# Patient Record
Sex: Male | Born: 1935 | Race: White | Hispanic: No | Marital: Married | Smoking: Former smoker
Health system: Southern US, Community
[De-identification: ages and names within clinical notes are randomized; demographics above are authoritative.]

## PROBLEM LIST (undated history)

## (undated) DIAGNOSIS — F0150 Vascular dementia without behavioral disturbance: Secondary | ICD-10-CM

## (undated) DIAGNOSIS — G43909 Migraine, unspecified, not intractable, without status migrainosus: Secondary | ICD-10-CM

## (undated) DIAGNOSIS — C801 Malignant (primary) neoplasm, unspecified: Secondary | ICD-10-CM

## (undated) DIAGNOSIS — Z9289 Personal history of other medical treatment: Secondary | ICD-10-CM

## (undated) DIAGNOSIS — I10 Essential (primary) hypertension: Secondary | ICD-10-CM

## (undated) DIAGNOSIS — K5790 Diverticulosis of intestine, part unspecified, without perforation or abscess without bleeding: Secondary | ICD-10-CM

## (undated) DIAGNOSIS — I2510 Atherosclerotic heart disease of native coronary artery without angina pectoris: Secondary | ICD-10-CM

## (undated) DIAGNOSIS — I240 Acute coronary thrombosis not resulting in myocardial infarction: Secondary | ICD-10-CM

## (undated) DIAGNOSIS — E785 Hyperlipidemia, unspecified: Secondary | ICD-10-CM

## (undated) DIAGNOSIS — H409 Unspecified glaucoma: Secondary | ICD-10-CM

## (undated) DIAGNOSIS — Z87442 Personal history of urinary calculi: Secondary | ICD-10-CM

## (undated) DIAGNOSIS — M1990 Unspecified osteoarthritis, unspecified site: Secondary | ICD-10-CM

## (undated) DIAGNOSIS — C679 Malignant neoplasm of bladder, unspecified: Secondary | ICD-10-CM

## (undated) HISTORY — DX: Acute coronary thrombosis not resulting in myocardial infarction: I24.0

## (undated) HISTORY — DX: Malignant neoplasm of bladder, unspecified: C67.9

## (undated) HISTORY — DX: Essential (primary) hypertension: I10

## (undated) HISTORY — DX: Personal history of other medical treatment: Z92.89

## (undated) HISTORY — DX: Hyperlipidemia, unspecified: E78.5

## (undated) HISTORY — PX: BASAL CELL CARCINOMA EXCISION: SHX1214

## (undated) HISTORY — PX: TONSILLECTOMY: SUR1361

## (undated) HISTORY — PX: KNEE ARTHROSCOPY: SUR90

## (undated) HISTORY — DX: Atherosclerotic heart disease of native coronary artery without angina pectoris: I25.10

## (undated) HISTORY — DX: Diverticulosis of intestine, part unspecified, without perforation or abscess without bleeding: K57.90

## (undated) HISTORY — DX: Unspecified glaucoma: H40.9

## (undated) HISTORY — PX: CATARACT EXTRACTION W/ INTRAOCULAR LENS  IMPLANT, BILATERAL: SHX1307

## (undated) NOTE — *Deleted (*Deleted)
PMR Admission Coordinator Pre-Admission Assessment  Patient: Jesus Harmon is an 23 y.o., male MRN: [id number] DOB: [birth year] Height: 5\' 11"  (180.3 cm) Weight: 61.2 kg  Insurance Information HMO: yes    PPO: ***     PCP: ***     IPA: ***     80/20: ***     OTHER: *** PRIMARY: BCBS Medicare      Policy#: [id number]      Subscriber: *** CM Name: ***      Phone#: [phone]     Fax#:  [phone]     Pre-Cert#: [id number]       Employer: *** Benefits:  Phone #: ***     Name: *** Eff. Date:  01/12/2019 - still active     Deduct:  $0 (has been met)     Out of Pocket Max: $3,900 [phone] met)     Life Max: n/a CIR: $335/admission co-pay      SNF:  100% coverage, 0% co-insurance; limited by medical necessity Outpatient: $40 co-pay per visit; limited by medical necessity Home Health 100% coverage, 0% co-insurance; limited by medical necessity DME: DME: 80% coverage, 20% co-insurance Providers: in network Home Health:  100% coverage, 0% co-insurance; limited by medical necessity DME: 80% coverage, 20% co-insurance  SECONDARY:none   The "Data Collection Information Summary" for patients in Inpatient Rehabilitation Facilities with attached "Privacy Act Statement-Health Care Records" was provided and verbally reviewed with: Patient and Family  Emergency Contact Information Contact Information    Name Relation Home Work [name], [name] Daughter   [phone]   [name] Daughter   [phone]   [name], [name] Spouse [phone]  [phone]      Current Medical History  Patient Admitting Diagnosis: THA s/p hip fx History of Present Illness: This is an 60 year old male with a history of CAD s/p stent, HFpEF,vascular dementia,Hypertension, hyperlipidemia who was sent in from [facility] office for left hip fracture. Patient had a fall onto his left hip several days ago resulting in pain most notably with ambulation.Larey Seat due to poor balance on uneven ground  outside. He has fallen multiple times over the past several weeks for similar reasons without LOC and had multiple falls this week. Mostrecent fallwas last night when he was sitting in his chair and leaning over to pick something up off the ground when he lost his balance and struck the back of his head on the wall. No LOC, dizziness, SOB, numbness/weakness or other potential causes to his falls. Struck his right cheek on the ground and suffered a small bruiseseveral weeks ago.His falls prompted him to go to the orthopedic urgent care today with his daughter where he was found to have a left hip fracture by Xray and was sent to [facility]o. Pt. underwent a left THA with anterior approach on 11/23/2019.   Patient's medical record from [facility]  has been reviewed by the rehabilitation admission coordinator and physician.  Past Medical History  Past Medical History:  Diagnosis Date  . Arthritis    back and neck (06/16/2016)  . Bladder cancer (HCC)   . Blockage of coronary artery of heart (HCC)    "widow maker", had stenting of the R side but L main is still blocked   . CAD (coronary artery disease)    a. LHC 10/2009:  pLAD 50%, small D1 70-80% (too small for PCI), EF 65% => med Rx  b. Myoview 10/2009: EF 65%, no ischemia;  c.  ETT-MV 3/14:  No ischemia, EF 63%  . Cancer [facility])    sin cancer removed nose   . Diverticulosis   . Glaucoma   . History of echocardiogram    Echo 02/2018: EF 55-60, +diastolic dysfunction, no RWMA, normal RVSF, trivial MR  . History of kidney stones    "passed them"  . HLD (hyperlipidemia)    "on RX; never had high cholestrol" (06/16/2016)  . HTN (hypertension)    "on RX; never HTN" (06/16/2016)  . Migraine    "in high school" (06/16/2016)    Family History   family history includes COPD in his father; Emphysema in his father; Heart failure in his father; Parkinson's disease in his maternal uncle; Sudden death in his mother.  Prior  Rehab/Hospitalizations Has the patient had prior rehab or hospitalizations prior to admission? Yes  Has the patient had major surgery during 100 days prior to admission? Yes   Current Medications  Current Facility-Administered Medications:  .  aspirin EC tablet 81 mg, 81 mg, Oral, Daily, Francena Hanly, MD, 81 mg at 11/24/19 1312 .  Chlorhexidine Gluconate Cloth 2 % PADS 6 each, 6 each, Topical, Daily, Rodolph Bong, MD, 6 each at 11/25/19 1211 .  docusate sodium (COLACE) capsule 100 mg, 100 mg, Oral, BID, Swinteck, Arlys John, MD, 100 mg at 11/24/19 2205 .  enoxaparin (LOVENOX) injection 30 mg, 30 mg, Subcutaneous, Q24H, Swinteck, Arlys John, MD, 30 mg at 11/25/19 1211 .  feeding supplement (ENSURE ENLIVE / ENSURE PLUS) liquid 237 mL, 237 mL, Oral, BID BM, Rodolph Bong, MD .  HYDROcodone-acetaminophen (NORCO/VICODIN) 5-325 MG per tablet 1-2 tablet, 1-2 tablet, Oral, Q6H PRN, Samson Frederic, MD, 1 tablet at 11/23/19 0427 .  latanoprost (XALATAN) 0.005 % ophthalmic solution 1 drop, 1 drop, Both Eyes, QHS, Swinteck, Brian, MD, 1 drop at 11/24/19 2154 .  lisinopril (ZESTRIL) tablet 10 mg, 10 mg, Oral, Daily, Swinteck, Arlys John, MD, 10 mg at 11/24/19 1312 .  loratadine (CLARITIN) tablet 10 mg, 10 mg, Oral, q1800, Swinteck, Arlys John, MD .  LORazepam (ATIVAN) injection 0.5 mg, 0.5 mg, Intravenous, Q12H PRN, Rodolph Bong, MD .  menthol-cetylpyridinium (CEPACOL) lozenge 3 mg, 1 lozenge, Oral, PRN **OR** phenol (CHLORASEPTIC) mouth spray 1 spray, 1 spray, Mouth/Throat, PRN, Swinteck, Arlys John, MD .  metoCLOPramide (REGLAN) tablet 5-10 mg, 5-10 mg, Oral, Q8H PRN **OR** metoCLOPramide (REGLAN) injection 5-10 mg, 5-10 mg, Intravenous, Q8H PRN, Swinteck, Arlys John, MD .  morphine 2 MG/ML injection 0.5 mg, 0.5 mg, Intravenous, Q4H PRN, Rodolph Bong, MD .  nitroGLYCERIN (NITROSTAT) SL tablet 0.4 mg, 0.4 mg, Sublingual, Q5 Min x 3 PRN, Samson Frederic, MD .  nutrition supplement (JUVEN) (JUVEN) powder packet 1  packet, 1 packet, Oral, BID BM, Rodolph Bong, MD, 1 packet at 11/24/19 1313 .  OLANZapine zydis (ZYPREXA) disintegrating tablet 5 mg, 5 mg, Oral, BID, Rodolph Bong, MD, 5 mg at 11/24/19 2205 .  ondansetron (ZOFRAN) tablet 4 mg, 4 mg, Oral, Q6H PRN **OR** ondansetron (ZOFRAN) injection 4 mg, 4 mg, Intravenous, Q6H PRN, Swinteck, Arlys John, MD .  polyethylene glycol (MIRALAX / GLYCOLAX) packet 17 g, 17 g, Oral, Daily PRN, Swinteck, Arlys John, MD .  simvastatin (ZOCOR) tablet 20 mg, 20 mg, Oral, Daily, Swinteck, Arlys John, MD, 20 mg at 11/23/19 0914 .  tiZANidine (ZANAFLEX) tablet 2 mg, 2 mg, Oral, Daily PRN, Swinteck, Arlys John, MD .  vitamin B-12 (CYANOCOBALAMIN) tablet 1,000 mcg, 1,000 mcg, Oral, Daily, Swinteck, Arlys John, MD, 1,000 mcg at 11/23/19 1007  Patients Current Diet:  Diet Order            Diet regular Room service appropriate? Yes; Fluid consistency: Thin  Diet effective now                 Precautions / Restrictions Precautions Precautions: Fall Restrictions Weight Bearing Restrictions: Yes LLE Weight Bearing: Weight bearing as tolerated   Has the patient had 2 or more falls or a fall with injury in the past year? Yes  Prior Activity Level Limited Community (1-2x/wk): Pt. went out for errands and appointments  Prior Functional Level Self Care: Did the patient need help bathing, dressing, using the toilet or eating? Independent  Indoor Mobility: Did the patient need assistance with walking from room to room (with or without device)? Independent  Stairs: Did the patient need assistance with internal or external stairs (with or without device)? Needed some help  Functional Cognition: Did the patient need help planning regular tasks such as shopping or remembering to take medications? Needed some help  Home Assistive Devices / Equipment Home Assistive Devices/Equipment: Teixeira (specify type), Eyeglasses (4 wheeled [place]) Home Equipment: Larose - 2 wheels, Cane - single  point  Prior Device Use: Indicate devices/aids used by the patient prior to current illness, exacerbation or injury? [place]  Current Functional Level Cognition  Overall Cognitive Status: Impaired/Different from baseline Current Attention Level: Focused Orientation Level: Disoriented X4 Following Commands: Follows one step commands inconsistently Safety/Judgement: Decreased awareness of safety, Decreased awareness of deficits General Comments: Pt does have hx of dementia but is further confused at this time.  He is fidgeting and pulling at blankets and lines.    Extremity Assessment (includes Sensation/Coordination)  Upper Extremity Assessment: Overall WFL for tasks assessed, Difficult to assess due to impaired cognition  Lower Extremity Assessment: Difficult to assess due to impaired cognition, LLE deficits/detail LLE Deficits / Details: Pt expressing some pain with L leg movement but did demonstrate 3/5 MMT throughout except hip flexion    ADLs       Mobility  Overal bed mobility: Needs Assistance Bed Mobility: Supine to Sit, Sit to Supine Supine to sit: Mod assist Sit to supine: +2 for physical assistance, Max assist General bed mobility comments: Required increased assistance due to dementia and unable to follow commands.  Mod A to initiate sitting but pt able to complete.  Max x 2 to return to supine as pt not following commands.    Transfers  Overall transfer level: Needs assistance Equipment used: 2 person hand held assist, Rolling [place]ug[place] (2 wheeled) Transfers: Sit to/from Stand Sit to Stand: Min assist, +2 safety/equipment General transfer comment: Pt not following commands for hand placement and at times using therapist hands and others using RW.  Min A x 2 for safety but very little assist provided to power up    Ambulation / Gait / Stairs / Wheelchair Mobility  Ambulation/Gait Ambulation/Gait assistance: Min assist, +2 safety/equipment Gait Distance (Feet): 10 Feet  Assistive device: Rolling [place] (2 wheeled), 2 person hand held assist, None Gait Pattern/deviations: Step-to pattern, Decreased stride length, Shuffle General Gait Details: Very poor safety awareness, not following commands, started with RW but not with hands in correct place, then pt released Coach for a couple steps and took steps with HHA. Pt was able to maintain standing and required min A of 2 forr safety due to confusion not necessarily for physical assist Gait velocity: decreased    Posture / Balance Balance Overall balance assessment: Needs assistance Sitting balance-Leahy Scale: Good  Standing balance support: Bilateral upper extremity supported, Single extremity supported, No upper extremity supported Standing balance-Leahy Scale: Poor Standing balance comment: requiring min Guard for safety; use of extremities at times; limited due to confusion but no overt LOB    Special needs/care consideration Skin Surgical incision  and Behavioral consideration Pt. with behavioral disturbance in unfamiliar environment, worse at nights   Previous Home Environment (from acute therapy documentation) Living Arrangements: Spouse/significant other Available Help at Discharge: Family, Available 24 hours/day Type of Home: House Home Layout: One level Home Access: Stairs to enter Entrance Stairs-Rails: None Entrance Stairs-Number of Steps: 1 Bathroom Shower/Tub: Health visitor: Standard Bathroom Accessibility: Yes How Accessible: Accessible via [place] Home Care Services: No  Discharge Living Setting Plans for Discharge Living Setting: Patient's home Type of Home at Discharge: House Discharge Home Layout: One level Discharge Home Access: Stairs to enter Entrance Stairs-Rails: Right, Left, Can reach both Entrance Stairs-Number of Steps: 3 Discharge Bathroom Shower/Tub: Tub/shower unit, Walk-in shower Discharge Bathroom Toilet: Standard Discharge Bathroom Accessibility: Yes  How Accessible: Accessible via [place] Does the patient have any problems obtaining your medications?: No  Social/Family/Support Systems Patient Roles: Spouse Contact Information: [phone] Anticipated Caregiver: Earley, Grobe is patient's wife. She requested that I speak to Steward Drone , Pt.'s daughter [phone]) regarding CIR, as she is an OR nurse at [employer]  Anticipated Caregiver's Contact Information: [phone] Ability/Limitations of Caregiver: can provide min A  (Can provide Min assist) Caregiver Availability: 24/7 Discharge Plan Discussed with Primary Caregiver: Yes Is Caregiver In Agreement with Plan?: Yes Does Caregiver/Family have Issues with Lodging/Transportation while Pt is in Rehab?: No  Goals Patient/Family Goal for Rehab: PT/OT Min A Expected length of stay: 7-10 days Pt/Family Agrees to Admission and willing to participate: Yes Program Orientation Provided & Reviewed with Pt/Caregiver Including Roles  & Responsibilities: Yes  Decrease burden of Care through IP rehab admission: Specialzed equipment needs, Decrease number of caregivers, Bowel and bladder program and Patient/family education  Possible need for SNF placement upon discharge: not anticipated  Patient Condition: I have reviewed medical records from [facility] , spoken with CM, and patient, spouse and daughter. I met with patient at the bedside and discussed via phone for inpatient rehabilitation assessment.  Patient will benefit from ongoing PT and OT, can actively participate in 3 hours of therapy a day 5 days of the week, and can make measurable gains during the admission.  Patient will also benefit from the coordinated team approach during an Inpatient Acute Rehabilitation admission.  The patient will receive intensive therapy as well as Rehabilitation physician, nursing, social worker, and care management interventions.  Due to safety, skin/wound care, disease management, medication  administration, pain management and patient education the patient requires 24 hour a day rehabilitation nursing.  The patient is currently mod A-Min A+2  with mobility and basic ADLs.  Discharge setting and therapy post discharge at home with home health is anticipated.  Patient has agreed to participate in the Acute Inpatient Rehabilitation Program and will admit {Time; today/tomorrow:10263}.  Preadmission Screen Completed By:  Jeronimo Greaves, 11/25/2019 3:14 PM ______________________________________________________________________   Discussed status with Dr. Marland Kitchen on *** at *** and received approval for admission today.  Admission Coordinator:  Jeronimo Greaves, CCC-SLP, time ***Dorna Bloom ***   Assessment/Plan: Diagnosis: 1. Does the need for close, 24 hr/day Medical supervision in concert with the patient's rehab needs make it unreasonable for this patient to be served in a less intensive setting? {yes_no_potentially:3041433} 2.  Co-Morbidities requiring supervision/potential complications: *** 3. Due to {due WU:9811914}, does the patient require 24 hr/day rehab nursing? {yes_no_potentially:3041433} 4. Does the patient require coordinated care of a physician, rehab nurse, PT, OT, and SLP to address physical and functional deficits in the context of the above medical diagnosis(es)? {yes_no_potentially:3041433} Addressing deficits in the following areas: {deficits:3041436} 5. Can the patient actively participate in an intensive therapy program of at least 3 hrs of therapy 5 days a week? {yes_no_potentially:3041433} 6. The potential for patient to make measurable gains while on inpatient rehab is {potential:3041437} 7. Anticipated functional outcomes upon discharge from inpatient rehab: {functional outcomes:304600100} PT, {functional outcomes:304600100} OT, {functional outcomes:304600100} SLP 8. Estimated rehab length of stay to reach the above functional goals is: *** 9. Anticipated discharge destination:  {anticipated dc setting:21604} 10. Overall Rehab/Functional Prognosis: {potential:3041437}   MD Signature: ***

---

## 1999-11-10 ENCOUNTER — Other Ambulatory Visit: Admission: RE | Admit: 1999-11-10 | Discharge: 1999-11-10 | Payer: Self-pay | Admitting: Family Medicine

## 2000-01-13 ENCOUNTER — Ambulatory Visit (HOSPITAL_COMMUNITY): Admission: RE | Admit: 2000-01-13 | Discharge: 2000-01-13 | Payer: Self-pay | Admitting: Gastroenterology

## 2000-07-21 ENCOUNTER — Encounter: Admission: RE | Admit: 2000-07-21 | Discharge: 2000-07-21 | Payer: Self-pay | Admitting: Orthopedic Surgery

## 2000-07-21 ENCOUNTER — Encounter: Payer: Self-pay | Admitting: Orthopedic Surgery

## 2001-02-24 ENCOUNTER — Encounter: Payer: Self-pay | Admitting: Family Medicine

## 2001-02-24 ENCOUNTER — Encounter: Admission: RE | Admit: 2001-02-24 | Discharge: 2001-02-24 | Payer: Self-pay | Admitting: Family Medicine

## 2001-03-06 ENCOUNTER — Ambulatory Visit (HOSPITAL_COMMUNITY): Admission: RE | Admit: 2001-03-06 | Discharge: 2001-03-06 | Payer: Self-pay | Admitting: Family Medicine

## 2006-04-16 ENCOUNTER — Emergency Department (HOSPITAL_COMMUNITY): Admission: EM | Admit: 2006-04-16 | Discharge: 2006-04-16 | Payer: Self-pay | Admitting: Emergency Medicine

## 2007-09-04 ENCOUNTER — Encounter: Admission: RE | Admit: 2007-09-04 | Discharge: 2007-09-04 | Payer: Self-pay | Admitting: Family Medicine

## 2009-04-01 ENCOUNTER — Encounter: Admission: RE | Admit: 2009-04-01 | Discharge: 2009-04-30 | Payer: Self-pay | Admitting: Specialist

## 2009-10-25 ENCOUNTER — Ambulatory Visit: Payer: Self-pay | Admitting: Cardiology

## 2009-10-25 ENCOUNTER — Inpatient Hospital Stay (HOSPITAL_COMMUNITY): Admission: EM | Admit: 2009-10-25 | Discharge: 2009-10-28 | Payer: Self-pay | Admitting: Emergency Medicine

## 2009-10-27 ENCOUNTER — Encounter: Payer: Self-pay | Admitting: Cardiology

## 2009-10-31 ENCOUNTER — Telehealth: Payer: Self-pay | Admitting: Cardiology

## 2009-11-12 DIAGNOSIS — R0602 Shortness of breath: Secondary | ICD-10-CM | POA: Insufficient documentation

## 2009-11-12 DIAGNOSIS — R5381 Other malaise: Secondary | ICD-10-CM | POA: Insufficient documentation

## 2009-11-12 DIAGNOSIS — R209 Unspecified disturbances of skin sensation: Secondary | ICD-10-CM | POA: Insufficient documentation

## 2009-11-12 DIAGNOSIS — R5383 Other fatigue: Secondary | ICD-10-CM

## 2009-11-14 ENCOUNTER — Ambulatory Visit: Payer: Self-pay | Admitting: Cardiology

## 2009-11-14 DIAGNOSIS — I2510 Atherosclerotic heart disease of native coronary artery without angina pectoris: Secondary | ICD-10-CM | POA: Insufficient documentation

## 2009-12-08 ENCOUNTER — Ambulatory Visit: Payer: Self-pay | Admitting: Cardiology

## 2009-12-11 LAB — CONVERTED CEMR LAB
ALT: 20 units/L (ref 0–53)
Bilirubin, Direct: 0.1 mg/dL (ref 0.0–0.3)
HDL: 71.2 mg/dL (ref >39.00)
LDL Cholesterol: 82 mg/dL (ref 0–99)
Total Bilirubin: 0.6 mg/dL (ref 0.3–1.2)
Total CHOL/HDL Ratio: 2
Triglycerides: 44 mg/dL (ref 0.0–149.0)

## 2010-01-26 ENCOUNTER — Telehealth: Payer: Self-pay | Admitting: Cardiology

## 2010-02-12 NOTE — Cardiovascular Report (Signed)
Summary: [facility]  [facility]   Imported By: Roderic Ovens 11/24/2009 09:22:56  _____________________________________________________________________  External Attachment:    Type:   Image     Comment:   External Document

## 2010-02-12 NOTE — Progress Notes (Signed)
Summary: checking on Nitro pills for pt  Phone Note Call from Patient Call back at Home Phone [phone]   Caller: Daughter/Lisa/ Corrie Dandy Summary of Call: Pt daughter checking on Nitro pills for pt will been seen by Dr.Janille Draughon on 11/14/09 at 2:30 Initial call taken by: Judie Grieve,  October 31, 2009 9:57 AM  Follow-up for Phone Call        Reviewed pt discharge meds with wife.  Pt is not experiencing any chest pain.   There was no order for NTG on discharge instruction sheet and she did not know whether he needed one or not.  He is taking Plavix, metoprolol, and zocor.  He wil see Dr. Daleen Squibb on 11/14/09. Mylo Red RN     Appended Document: checking on Nitro pills for pt AGREE. WILL REVIEW IN OFFICE.  Reviewed [name], MD

## 2010-02-12 NOTE — Progress Notes (Signed)
Summary: what can pt take for headaches  Phone Note Call from Patient   Caller: Patient [phone] Reason for Call: Talk to Nurse Summary of Call: pt on simvastatin -what can he take for headaches?  Initial call taken by: Glynda Jaeger,  January 26, 2010 10:35 AM  Follow-up for Phone Call        I spoke with the pt and made him aware he can take Tylenol or Extra Strength Tylenol for headaches, no NSAID.  Follow-up by: Julieta Gutting, RN, BSN,  January 26, 2010 10:50 AM

## 2010-02-12 NOTE — Assessment & Plan Note (Signed)
Summary: eph/per Bjorn Loser call/lg   Primary Provider:  Mikey Bussing   History of Present Illness: [name] comes in today after being discharged from the hospital.  Please refer to the H&P, consultation, and discharge summary.  He presented with the sudden onset of fatigue and shortness of breath with bilateral tingling in his arms while sawing wood. He ruled out for myocardial infarction and his d-dimer was normal. Chest x-ray was unremarkable. He was hypertensive and tachycardic in the emergency room.  He underwent cardiac catheterization. EF was 65% with a 50% proximal LAD that was heavily calcified. He also had a 78% stenosis in a small first diagonal. He was not felt to be large enough for PCI.  He underwent a stress Myoview prior to discharge which showed no ischemia with an EF of 65%. This was an exercise Myoview.  His total cholesterol is 175, LDL 94, HDL 70. TSH was normal. His BNP on admission was only 30 or less.  He said no recurrent symptoms. He's been anxious and concerned about doing exertional activity. His wife has been concerned as well and is with him today.  Clinical Reports Reviewed:  Cardiac Cath:  10/28/2009: Cardiac Cath Findings:   The left ventriculogram was performed in the 30-degree RAO position.  It   reveals normal left ventricular systolic function.  Ejection fraction is   65%.      COMPLICATIONS:  None.      CONCLUSIONS:   1. Single-vessel coronary artery disease primarily involving the left       anterior descending artery.  This vessel is       very heavily calcified.  It appears to be around 50%.  We will add       Plavix.  He will need very aggressive cholesterol management.  We       will need to get a stress Myoview to further assess this LAD       stenosis.  I will review the films with Dr. Clifton James.               Vesta Mixer, M.D.          Current Medications (verified): 1)  Aspirin 81 Mg  Tabs (Aspirin) .Marland Kitchen.. 1 By Mouth  Daily 2)  Plavix 75 Mg Tabs (Clopidogrel Bisulfate) .Marland Kitchen.. 1 By Mouth Daily 3)  Metoprolol Tartrate 25 Mg Tabs (Metoprolol Tartrate) .Marland Kitchen.. 1 By Mouth Two Times A Day 4)  Zocor 20 Mg Tabs (Simvastatin) .Marland Kitchen.. 1 By Mouth Daily 5)  Xalatan 0.005 % Soln (Latanoprost) .... As Directed  Allergies (verified): 1)  ! Sulfa  Past History:  Past Medical History: Last updated: 12/06/09 Elevated intraoptic pressure  Past Surgical History: Last updated: 2009-12-06 Knee Arthroscopy  Family History: Last updated: 12/06/09  His mother is deceased at the age of 67 from sudden   cardiac arrest.  His father is deceased in his 68s from COPD and   congestive heart failure.      Social History: Last updated: 06-Dec-2009  The patient lives in [place] with his wife.  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   He denies any tobacco, alcohol, or illicit drug use.  He does not have a   regular exercise program, but does mow his yard and weed-eats regularly.      Review of Systems       negative other than history of present illness  Vital Signs:  Patient profile:  77 year old male Height:      72 inches Weight:      177 pounds BMI:     24.09 Pulse rate:   66 / minute Resp:     16 per minute BP sitting:   134 / 84  (left arm)  Vitals Entered By: Marrion Coy, CNA (November 14, 2009 2:23 PM)  Physical Exam  General:  Well developed, well nourished, in no acute distress. Head:  normocephalic and atraumatic Eyes:  PERRLA/EOM intact; conjunctiva and lids normal. Neck:  Neck supple, no JVD. No masses, thyromegaly or abnormal cervical nodes. Chest Wall:  no deformities or breast masses noted Lungs:  Clear bilaterally to auscultation and percussion. Heart:  PMI nondisplaced, regular rate and rhythm, no murmur rub or gallop. Carotids equal bilaterally without bruit Msk:  Back normal, normal gait. Muscle strength and tone normal. Pulses:  pulses normal  in all 4 extremities Extremities:  No clubbing or cyanosis. Neurologic:  Alert and oriented x 3. Skin:  Intact without lesions or rashes. Psych:  Normal affect.   Impression & Recommendations:  Problem # 1:  CAD, NATIVE VESSEL (ICD-414.01) Assessment New I had a long discussion with the patient and wife and demonstrate his coronary anatomy by a heart model. We also talked about coronary spasm, plaque instability, and the utility of a statin and aspirin as well as Plavix. We will continue those medications have also prescribed sublingual nitroglycerin and how to use it. I think is unlikely that this was indeed coronary spasm or ischemia and doubt that it will happen again. I encouraged him to increase his activity. Will arrange for him to have lipids and LFTs in about 4-6 weeks and send a copy to Dr. Clovis Riley. In addition I will taper his metoprolol over the next week. All questions answered. His updated medication list for this problem includes:    Aspirin 81 Mg Tabs (Aspirin) .Marland Kitchen... 1 by mouth daily    Plavix 75 Mg Tabs (Clopidogrel bisulfate) .Marland Kitchen... 1 by mouth daily    Metoprolol Tartrate 25 Mg Tabs (Metoprolol tartrate) .Marland Kitchen... 1 tablet daily for 7 days then stop.    Nitrostat 0.4 Mg Subl (Nitroglycerin) .Marland Kitchen... 1 tablet under tongue at onset of chest pain; you may repeat every 5 minutes for up to 3 doses.  Orders: EKG w/ Interpretation (93000)  Patient Instructions: 1)  Your physician recommends that you schedule a follow-up appointment in: 3 months with Dr. Daleen Squibb 2)  Your physician recommends that you return for a FASTING lipid profile & liver in 4 weeks   414.01, 272.4 3)  Your physician has recommended you make the following change in your medication: metoprolol - Take 1 tablet daily for 7 days then stop. Prescriptions: NITROSTAT 0.4 MG SUBL (NITROGLYCERIN) 1 tablet under tongue at onset of chest pain; you may repeat every 5 minutes for up to 3 doses.  #25 x 6   Entered by:   Lisabeth Devoid  RN   Authorized by:   Gaylord Shih, MD, [facility]   Signed by:   Lisabeth Devoid RN on 11/14/2009   Method used:   Electronically to        [facility]  [address] [id number] (retail)       [address]       [place], [place]  [address]       Ph: [phone]       Fax: [phone]   RxID:  [id number]  

## 2010-02-17 ENCOUNTER — Encounter: Payer: Self-pay | Admitting: Cardiology

## 2010-02-17 ENCOUNTER — Ambulatory Visit (INDEPENDENT_AMBULATORY_CARE_PROVIDER_SITE_OTHER): Payer: Medicare Other | Admitting: Cardiology

## 2010-02-17 DIAGNOSIS — I10 Essential (primary) hypertension: Secondary | ICD-10-CM

## 2010-02-17 DIAGNOSIS — E785 Hyperlipidemia, unspecified: Secondary | ICD-10-CM | POA: Insufficient documentation

## 2010-02-17 DIAGNOSIS — I2510 Atherosclerotic heart disease of native coronary artery without angina pectoris: Secondary | ICD-10-CM

## 2010-02-26 NOTE — Assessment & Plan Note (Signed)
Summary: per check out/sf rs from bum[facility],gd/ep   Visit Type:  Follow-up Primary Provider:  Mikey Harmon  CC:  some sob and pt has no other complaints today.Marland Kitchen  History of Present Illness: Jesus Harmon comes in today for followup of his coronary artery disease.  He remains very active cutting wood and working outdoors. He's had no exertional shortness of breath or angina. He remains on Plavix and aspirin. He's had a lot of bruising.  His daughter is very concerned about and eating a lot of fried foods. We talked about moderation a day. He is on simvastatin with a total cholesterol 162, triglycerides of 44, HDL 71.2, LDL 82 with a total cholesterol to HDL ratio of 2. LFTs are normal.  His last stress Myoview showed no ischemia. This was in October of 2011 after his catheterization.    Clinical Reports Reviewed:  Cardiac Cath:  10/28/2009: Cardiac Cath Findings:   The left ventriculogram was performed in the 30-degree RAO position.  It   reveals normal left ventricular systolic function.  Ejection fraction is   65%.      COMPLICATIONS:  None.      CONCLUSIONS:   1. Single-vessel coronary artery disease primarily involving the left       anterior descending artery.  This vessel is       very heavily calcified.  It appears to be around 50%.  We will add       Plavix.  He will need very aggressive cholesterol management.  We       will need to get a stress Myoview to further assess this LAD       stenosis.  I will review the films with Dr. Clifton Harmon.               Vesta Mixer, M.D.          Problems Prior to Update: 1)  Hypertension, Unspecified  (ICD-401.9) 2)  Hyperlipidemia-mixed  (ICD-272.4) 3)  Cad, Native Vessel  (ICD-414.01) 4)  Arm Numbness  (ICD-782.0) 5)  Dyspnea  (ICD-786.05) 6)  Fatigue  (ICD-780.79)  Current Medications (verified): 1)  Aspirin 81 Mg  Tabs (Aspirin) .Marland Kitchen.. 1 By Mouth Daily 2)  Plavix 75 Mg Tabs (Clopidogrel Bisulfate) .Marland Kitchen.. 1 By Mouth Daily 3)   Zocor 20 Mg Tabs (Simvastatin) .Marland Kitchen.. 1 By Mouth Daily 4)  Xalatan 0.005 % Soln (Latanoprost) .... As Directed 5)  Nitrostat 0.4 Mg Subl (Nitroglycerin) .Marland Kitchen.. 1 Tablet Under Tongue At Onset of Chest Pain; You May Repeat Every 5 Minutes For Up To 3 Doses.  Allergies (verified): 1)  ! Sulfa  Past History:  Past Medical History: Last updated: 15-Nov-2009 Elevated intraoptic pressure  Past Surgical History: Last updated: 11-15-2009 Knee Arthroscopy  Family History: Last updated: 11-15-09  His mother is deceased at the age of 60 from sudden   cardiac arrest.  His father is deceased in his 3s from COPD and   congestive heart failure.      Social History: Last updated: 11/15/09  The patient lives in [place] with his wife.  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   He denies any tobacco, alcohol, or illicit drug use.  He does not have a   regular exercise program, but does mow his yard and weed-eats regularly.      Review of Systems       negative other than history of present illness  Vital Signs:  Patient profile:  77 year old male Height:      72 inches Weight:      176.75 pounds BMI:     24.06 Pulse rate:   64 / minute Resp:     18 per minute BP sitting:   150 / 84  (left arm) Cuff size:   large  Vitals Entered By: Celestia Khat, CMA (February 17, 2010 10:38 AM)  Physical Exam  General:  Well developed, well nourished, in no acute distress. Head:  normocephalic and atraumatic Eyes:  PERRLA/EOM intact; conjunctiva and lids normal. Neck:  Neck supple, no JVD. No masses, thyromegaly or abnormal cervical nodes. Chest Jesus Harmon:  no deformities or breast masses noted Lungs:  Clear bilaterally to auscultation and percussion. Heart:  PMI nondisplaced, normal S1-S2, no carotid bruits Msk:  Back normal, normal gait. Muscle strength and tone normal. Pulses:  pulses normal in all 4 extremities Extremities:  No clubbing or  cyanosis. Neurologic:  Alert and oriented x 3. Skin:  Intact without lesions or rashes. Psych:  Normal affect.   Problems:  Medical Problems Added: 1)  Dx of Hypertension, Unspecified  (ICD-401.9) 2)  Dx of Hyperlipidemia-mixed  (ICD-272.4)  Impression & Recommendations:  Problem # 1:  CAD, NATIVE VESSEL (ICD-414.01) Assessment Unchanged Will discontinue his Plavix The following medications were removed from the medication list:    Plavix 75 Mg Tabs (Clopidogrel bisulfate) .Marland Kitchen... 1 by mouth daily    Metoprolol Tartrate 25 Mg Tabs (Metoprolol tartrate) .Marland Kitchen... 1 tablet daily for 7 days then stop. His updated medication list for this problem includes:    Aspirin 81 Mg Tabs (Aspirin) .Marland Kitchen... 1 by mouth daily    Nitrostat 0.4 Mg Subl (Nitroglycerin) .Marland Kitchen... 1 tablet under tongue at onset of chest pain; you may repeat every 5 minutes for up to 3 doses.  Problem # 2:  HYPERLIPIDEMIA-MIXED (ICD-272.4) Assessment: Improved  His updated medication list for this problem includes:    Zocor 20 Mg Tabs (Simvastatin) .Marland Kitchen... 1 by mouth daily  Problem # 3:  HYPERTENSION, UNSPECIFIED (ICD-401.9) Assessment: New I rechecked his blood pressure left arm with an adult cuff of 160/80. He says his blood pressure cuff at home checked by his daughter who is a Engineer, civil (consulting) using measures 120/60. I asked him to have this checked either fire station her office. If he is hypertensive, this needs to be treated. He seems to be fairly careful with salt The following medications were removed from the medication list:    Metoprolol Tartrate 25 Mg Tabs (Metoprolol tartrate) .Marland Kitchen... 1 tablet daily for 7 days then stop. His updated medication list for this problem includes:    Aspirin 81 Mg Tabs (Aspirin) .Marland Kitchen... 1 by mouth daily  Patient Instructions: 1)  Your physician recommends that you schedule a follow-up appointment in: October 2012 with Dr. Daleen Squibb 2)  Your physician has recommended you make the following change in your  medication: Stop Plavix 3)  Bring your blood pressure cuff to the office at your convenience to check for accuracy.  Appended Document: per check out/sf rs from [facility],gd/ep Pt came in today for bp check in comparison with his machine.  He was a little anxious and had been working outside this am. After resting 5-8 minutes his bp was:  His machine:    Left: 172/83        Right:  162/79  Manual check:  Left:  164/74       Right:  150/69  heart rates 70-75 Jesus Harmon [employer]

## 2010-03-02 ENCOUNTER — Telehealth: Payer: Self-pay | Admitting: Cardiology

## 2010-03-10 NOTE — Progress Notes (Signed)
Summary: pt needs refill  Phone Note Refill Request Call back at Home Phone [phone] Message from:  Patient  Refills Requested: Medication #1:  ZOCOR 20 MG TABS 1 by mouth daily pt needs [facility]ill [facility] [address] phone number [phone]  Initial call taken by: Omer Jack,  March 02, 2010 9:10 AM    Prescriptions: ZOCOR 20 MG TABS (SIMVASTATIN) 1 by mouth daily  #30 x 6   Entered by:   Celestia Khat, CMA   Authorized by:   Gaylord Shih, MD, [facility]   Signed by:   Celestia Khat, CMA on 03/02/2010   Method used:   [facility]ronically to        [facility]  [address] [id number] ([facility]       [address]       [place], [place]  [address]       Ph: [phone]       Fax: [phone]   RxID:   [id number]

## 2010-03-25 LAB — LIPID PANEL
HDL: 70 mg/dL (ref >39)
Total CHOL/HDL Ratio: 2.5 RATIO
Triglycerides: 55 mg/dL (ref <150)
VLDL: 11 mg/dL (ref 0–40)

## 2010-03-25 LAB — BRAIN NATRIURETIC PEPTIDE: Pro B Natriuretic peptide (BNP): <30 pg/mL (ref 0.0–100.0)

## 2010-03-25 LAB — BASIC METABOLIC PANEL
BUN: 12 mg/dL (ref 6–23)
BUN: 15 mg/dL (ref 6–23)
BUN: 18 mg/dL (ref 6–23)
CO2: 29 mEq/L (ref 19–32)
Calcium: 8.8 mg/dL (ref 8.4–10.5)
Calcium: 9.5 mg/dL (ref 8.4–10.5)
Chloride: 105 mEq/L (ref 96–112)
Creatinine, Ser: 0.79 mg/dL (ref 0.4–1.5)
Creatinine, Ser: 0.92 mg/dL (ref 0.4–1.5)
GFR calc non Af Amer: >60 mL/min (ref >60)
GFR calc non Af Amer: >60 mL/min (ref >60)
Glucose, Bld: 100 mg/dL — ABNORMAL HIGH (ref 70–99)
Glucose, Bld: 99 mg/dL (ref 70–99)

## 2010-03-25 LAB — CBC
HCT: 38.1 % — ABNORMAL LOW (ref 39.0–52.0)
Hemoglobin: 12.2 g/dL — ABNORMAL LOW (ref 13.0–17.0)
MCH: 29.6 pg (ref 26.0–34.0)
MCHC: 32.2 g/dL (ref 30.0–36.0)
MCV: 91.8 fL (ref 78.0–100.0)
Platelets: 287 10*3/uL (ref 150–400)
RBC: 4.15 MIL/uL — ABNORMAL LOW (ref 4.22–5.81)
RDW: 12.9 % (ref 11.5–15.5)
RDW: 13.3 % (ref 11.5–15.5)
RDW: 13.3 % (ref 11.5–15.5)
WBC: 16.7 10*3/uL — ABNORMAL HIGH (ref 4.0–10.5)
WBC: 6.4 10*3/uL (ref 4.0–10.5)

## 2010-03-25 LAB — POCT CARDIAC MARKERS
CKMB, poc: <1 ng/mL — ABNORMAL LOW (ref 1.0–8.0)
Troponin i, poc: <0.05 ng/mL (ref 0.00–0.09)

## 2010-03-25 LAB — CK TOTAL AND CKMB (NOT AT ARMC): CK, MB: 1.7 ng/mL (ref 0.3–4.0)

## 2010-03-25 LAB — CARDIAC PANEL(CRET KIN+CKTOT+MB+TROPI)
CK, MB: 1.5 ng/mL (ref 0.3–4.0)
Troponin I: 0.02 ng/mL (ref 0.00–0.06)
Troponin I: 0.02 ng/mL (ref 0.00–0.06)

## 2010-03-25 LAB — DIFFERENTIAL
Basophils Absolute: 0 10*3/uL (ref 0.0–0.1)
Eosinophils Relative: 0 % (ref 0–5)
Lymphocytes Relative: 5 % — ABNORMAL LOW (ref 12–46)
Neutro Abs: 14.5 10*3/uL — ABNORMAL HIGH (ref 1.7–7.7)

## 2010-03-25 LAB — PROTIME-INR: Prothrombin Time: 13.2 seconds (ref 11.6–15.2)

## 2010-04-20 ENCOUNTER — Telehealth: Payer: Self-pay | Admitting: Cardiology

## 2010-04-20 NOTE — Telephone Encounter (Signed)
Called and spoke to patient's wife. They are aware of fasting blood work results. I will have the results from [facility] scanned into [facility].

## 2010-09-29 ENCOUNTER — Other Ambulatory Visit: Payer: Self-pay | Admitting: Cardiology

## 2010-10-23 ENCOUNTER — Encounter: Payer: Self-pay | Admitting: Cardiology

## 2010-10-27 ENCOUNTER — Ambulatory Visit (INDEPENDENT_AMBULATORY_CARE_PROVIDER_SITE_OTHER): Payer: Medicare Other | Admitting: Cardiology

## 2010-10-27 ENCOUNTER — Encounter: Payer: Self-pay | Admitting: Cardiology

## 2010-10-27 VITALS — BP 156/76 | HR 64 | Ht 72.0 in | Wt 175.1 lb

## 2010-10-27 DIAGNOSIS — I2510 Atherosclerotic heart disease of native coronary artery without angina pectoris: Secondary | ICD-10-CM

## 2010-10-27 DIAGNOSIS — I10 Essential (primary) hypertension: Secondary | ICD-10-CM

## 2010-10-27 DIAGNOSIS — E785 Hyperlipidemia, unspecified: Secondary | ICD-10-CM

## 2010-10-27 MED ORDER — LISINOPRIL 10 MG PO TABS: 10.0000 mg | ORAL_TABLET | Freq: Every day | ORAL | Status: DC

## 2010-10-27 NOTE — Progress Notes (Signed)
HPI [name] comes in for evaluation management of his coronary disease. He remains very active working out yard sometimes as much as 5 hours. He push mows a bank without angina. He does get fatigued and winded at times.  His daughter and wife are here very concerned about the fact that he gets short of breath and is fatigued. He He checks his blood pressure at rest is usually under good control. They say his face gets very red when he works out in the yard. He says his blood pressures but under good control at rest. It is high today. He  He has not had routine blood work done by Dr. Clovis Riley in some time.     Past Medical History  Diagnosis Date  . Other activity     elevated intraoptic pressure    Past Surgical History  Procedure Date  . Knee arthroscopy     Family History  Problem Relation Age of Onset  . Sudden death Mother     sudden cardiac arrest  . COPD Father   . Heart failure Father     CHF    History   Social History  . Marital Status: Married    Spouse Name: N/A    Number of Children: N/A  . Years of Education: N/A   Occupational History  . retired   . part time car auctioneer    Social History Main Topics  . Smoking status: Never Smoker   . Smokeless tobacco: Never Used  . Alcohol Use: No  . Drug Use: No  . Sexually Active: Not on file   Other Topics Concern  . Not on file   Social History Narrative  . No narrative on file    Allergies  Allergen Reactions  . Sulfonamide Derivatives     Current Outpatient Prescriptions  Medication Sig Dispense Refill  . aspirin 81 MG tablet Take 81 mg by mouth daily.        Marland Kitchen latanoprost (XALATAN) 0.005 % ophthalmic solution 1 drop at bedtime.        . nitroGLYCERIN (NITROSTAT) 0.4 MG SL tablet Place 0.4 mg under the tongue every 5 (five) minutes as needed.        . simvastatin (ZOCOR) 20 MG tablet TAKE 1 TABLET BY MOUTH DAILY  30 tablet  6  . lisinopril (PRINIVIL,ZESTRIL) 10 MG tablet Take 1 tablet (10  mg total) by mouth daily.  30 tablet  11    ROS Negative other than HPI.   PE General Appearance: well developed, well nourished in no acute distress HEENT: symmetrical face, PERRLA, good dentition  Neck: no JVD, thyromegaly, or adenopathy, trachea midline Chest: symmetric without deformity Cardiac: PMI non-displaced, RRR, normal S1, S2, no gallop or murmur Lung: clear to ausculation and percussion Vascular: all pulses full without bruits  Abdominal: nondistended, nontender, good bowel sounds, no HSM, no bruits Extremities: no cyanosis, clubbing or edema, no sign of DVT, no varicosities  Skin: normal color, no rashes, Ruddy complexion particularly face Neuro: alert and oriented x 3, non-focal Pysch: normal affect Filed Vitals:   10/27/10 1212 10/27/10 1223  BP: 148/82 156/76  Pulse:  64  Height: 6' (1.829 m)   Weight: 175 lb 1.9 oz (79.434 kg)     EKG Normal sinus rhythm, nonspecific ST segment changes in lead one, without significant change since last ECG Labs and Studies Reviewed.   Lab Results  Component Value Date   WBC 8.0 10/28/2009   HGB 12.0* 10/28/2009  HCT 37.3* 10/28/2009   MCV 92.1 10/28/2009   PLT 273 10/28/2009      Chemistry      Component Value Date/Time   NA 140 10/28/2009 0515   K 4.1 10/28/2009 0515   CL 107 10/28/2009 0515   CO2 29 10/28/2009 0515   BUN 12 10/28/2009 0515   CREATININE 0.80 10/28/2009 0515      Component Value Date/Time   CALCIUM 8.8 10/28/2009 0515   ALKPHOS 57 12/08/2009 0851   AST 21 12/08/2009 0851   ALT 20 12/08/2009 0851   BILITOT 0.6 12/08/2009 0851       Lab Results  Component Value Date   CHOL 162 12/08/2009   CHOL  Value: 175        ATP III CLASSIFICATION:  <200     mg/dL   Desirable  119-147  mg/dL   Borderline High  >=829    mg/dL   High        56/21/3086   Lab Results  Component Value Date   HDL 71.20 12/08/2009   HDL 70 10/26/2009   Lab Results  Component Value Date   LDLCALC 82 12/08/2009    LDLCALC  Value: 94        Total Cholesterol/HDL:CHD Risk Coronary Heart Disease Risk Table                     Men   Women  1/2 Average Risk   3.4   3.3  Average Risk       5.0   4.4  2 X Average Risk   9.6   7.1  3 X Average Risk  23.4   11.0        Use the calculated Patient Ratio above and the CHD Risk Table to determine the patient's CHD Risk.        ATP III CLASSIFICATION (LDL):  <100     mg/dL   Optimal  578-469  mg/dL   Near or Above                    Optimal  130-159  mg/dL   Borderline  629-528  mg/dL   High  >413     mg/dL   Very High 24/40/1027   Lab Results  Component Value Date   TRIG 44.0 12/08/2009   TRIG 55 10/26/2009   Lab Results  Component Value Date   CHOLHDL 2 12/08/2009   CHOLHDL 2.5 10/26/2009   No results found for this basename: HGBA1C   Lab Results  Component Value Date   ALT 20 12/08/2009   AST 21 12/08/2009   ALKPHOS 57 12/08/2009   BILITOT 0.6 12/08/2009   Lab Results  Component Value Date   TSH 1.649 10/25/2009

## 2010-10-27 NOTE — Assessment & Plan Note (Signed)
I suspect his blood pressure is not under good control particularly with activity. I've added lisinopril as mentioned above.

## 2010-10-27 NOTE — Patient Instructions (Addendum)
Your physician has recommended you make the following change in your medication:  Start Lisinopril  Your physician has requested that you regularly monitor and record your blood pressure readings at home. Please use the same machine at the same time of day to check your readings and record them to bring to your follow-up visit.   Your physician recommends that you return for lab work on Friday October 26,12 for fasting labs and a blood pressure check with our nurse room.  Your physician wants you to follow-up in: 6 months with Dr. Daleen Squibb.  You will receive a reminder letter in the mail two months in advance. If you don't receive a letter, please call our office to schedule the follow-up appointment.

## 2010-10-27 NOTE — Assessment & Plan Note (Signed)
Stable. No change in medical therapy except that her blood pressure control. We'll add lisinopril 10 mg p.o. Q. Day. We'll have him keep a blood pressure record and return for a blood pressure check and a chemistry panel in 10 days. We will also draw his lipids at that time since he is not return to his primary care in over a year. I've encouraged him to return to see Dr. Clovis Riley.

## 2010-11-03 ENCOUNTER — Other Ambulatory Visit: Payer: Medicare Other | Admitting: *Deleted

## 2010-11-06 ENCOUNTER — Other Ambulatory Visit (INDEPENDENT_AMBULATORY_CARE_PROVIDER_SITE_OTHER): Payer: Medicare Other | Admitting: *Deleted

## 2010-11-06 ENCOUNTER — Ambulatory Visit (INDEPENDENT_AMBULATORY_CARE_PROVIDER_SITE_OTHER): Payer: Medicare Other

## 2010-11-06 VITALS — BP 128/68 | HR 72 | Ht 72.0 in | Wt 173.8 lb

## 2010-11-06 DIAGNOSIS — I10 Essential (primary) hypertension: Secondary | ICD-10-CM

## 2010-11-06 DIAGNOSIS — I2510 Atherosclerotic heart disease of native coronary artery without angina pectoris: Secondary | ICD-10-CM

## 2010-11-06 DIAGNOSIS — E785 Hyperlipidemia, unspecified: Secondary | ICD-10-CM

## 2010-11-06 LAB — LIPID PANEL
Cholesterol: 153 mg/dL (ref 0–200)
Total CHOL/HDL Ratio: 2
Triglycerides: 34 mg/dL (ref 0.0–149.0)

## 2010-11-06 LAB — HEPATIC FUNCTION PANEL
AST: 22 U/L (ref 0–37)
Albumin: 3.9 g/dL (ref 3.5–5.2)
Alkaline Phosphatase: 53 U/L (ref 39–117)
Bilirubin, Direct: 0.1 mg/dL (ref 0.0–0.3)
Total Protein: 7 g/dL (ref 6.0–8.3)

## 2010-11-06 LAB — BASIC METABOLIC PANEL
BUN: 19 mg/dL (ref 6–23)
CO2: 31 mEq/L (ref 19–32)
Calcium: 9.1 mg/dL (ref 8.4–10.5)
Chloride: 105 mEq/L (ref 96–112)
Creatinine, Ser: 0.8 mg/dL (ref 0.4–1.5)

## 2010-11-06 NOTE — Patient Instructions (Signed)
Your physician has requested that you regularly monitor and record your blood pressure readings at home. Please use the same machine at the same time of day to check your readings and record them to bring to your follow-up visit. ° °

## 2010-11-09 ENCOUNTER — Other Ambulatory Visit: Payer: Self-pay | Admitting: *Deleted

## 2010-11-09 ENCOUNTER — Encounter: Payer: Self-pay | Admitting: *Deleted

## 2010-11-09 NOTE — Telephone Encounter (Signed)
error 

## 2011-01-28 ENCOUNTER — Encounter: Payer: Self-pay | Admitting: Cardiology

## 2011-04-29 ENCOUNTER — Other Ambulatory Visit: Payer: Self-pay

## 2011-04-29 ENCOUNTER — Other Ambulatory Visit: Payer: Self-pay | Admitting: Cardiology

## 2011-04-29 MED ORDER — SIMVASTATIN 20 MG PO TABS: 20.0000 mg | ORAL_TABLET | Freq: Every day | ORAL | Status: DC

## 2011-05-07 ENCOUNTER — Telehealth: Payer: Self-pay | Admitting: Cardiology

## 2011-05-07 NOTE — Telephone Encounter (Signed)
Please return call to patient daughter Marrion Coy [phone]   Patient is having a teribble time with allergies and needs some relief.  Please return call to daughter to advise of medication to help, she can be reached at [employer] [phone].

## 2011-05-07 NOTE — Telephone Encounter (Signed)
Returned call to daughter, [name]. Pt is having trouble with his allergies and wanted to make sure he could take Claritin. Reviewed meds. Okay for Claritin (loratidine) without decongestant. Mylo Red RN

## 2011-05-21 ENCOUNTER — Ambulatory Visit (INDEPENDENT_AMBULATORY_CARE_PROVIDER_SITE_OTHER): Payer: Medicare Other | Admitting: Cardiology

## 2011-05-21 ENCOUNTER — Encounter: Payer: Self-pay | Admitting: Cardiology

## 2011-05-21 VITALS — BP 132/72 | HR 68 | Ht 72.0 in | Wt 171.0 lb

## 2011-05-21 DIAGNOSIS — I2510 Atherosclerotic heart disease of native coronary artery without angina pectoris: Secondary | ICD-10-CM

## 2011-05-21 DIAGNOSIS — E785 Hyperlipidemia, unspecified: Secondary | ICD-10-CM

## 2011-05-21 DIAGNOSIS — I10 Essential (primary) hypertension: Secondary | ICD-10-CM

## 2011-05-21 NOTE — Patient Instructions (Signed)
Your physician recommends that you continue on your current medications as directed. Please refer to the Current Medication list given to you today.  Your physician wants you to follow-up in: 1 year. You will receive a reminder letter in the mail two months in advance. If you don't receive a letter, please call our office to schedule the follow-up appointment.  

## 2011-05-21 NOTE — Progress Notes (Signed)
HPI Jesus Harmon returns today for evaluation and management of his coronary disease. He remains active with chronic dyspnea on exertion. He has had no chest pain or angina.  He is compliant with his medications. His lipids were to goal in October of last year. Reviewed with patient.  Past Medical History  Diagnosis Date  . Other activity     elevated intraoptic pressure    Current Outpatient Prescriptions  Medication Sig Dispense Refill  . aspirin 81 MG tablet Take 81 mg by mouth daily.        Marland Kitchen latanoprost (XALATAN) 0.005 % ophthalmic solution 1 drop at bedtime.        Marland Kitchen lisinopril (PRINIVIL,ZESTRIL) 10 MG tablet Take 1 tablet (10 mg total) by mouth daily.  30 tablet  11  . nitroGLYCERIN (NITROSTAT) 0.4 MG SL tablet Place 0.4 mg under the tongue every 5 (five) minutes as needed.        . simvastatin (ZOCOR) 20 MG tablet Take 1 tablet (20 mg total) by mouth daily.  30 tablet  1    Allergies  Allergen Reactions  . Amoxicillin     Swelling and rash to face   . Sulfonamide Derivatives     Family History  Problem Relation Age of Onset  . Sudden death Mother     sudden cardiac arrest  . COPD Father   . Heart failure Father     CHF    History   Social History  . Marital Status: Married    Spouse Name: N/A    Number of Children: N/A  . Years of Education: N/A   Occupational History  . retired   . part time car auctioneer    Social History Main Topics  . Smoking status: Never Smoker   . Smokeless tobacco: Never Used  . Alcohol Use: No  . Drug Use: No  . Sexually Active: Not on file   Other Topics Concern  . Not on file   Social History Narrative  . No narrative on file    ROS ALL NEGATIVE EXCEPT THOSE NOTED IN HPI  PE  General Appearance: well developed, well nourished in no acute distress HEENT: symmetrical face, PERRLA, good dentition  Neck: no JVD, thyromegaly, or adenopathy, trachea midline Chest: symmetric without deformity Cardiac: PMI  non-displaced, RRR, normal S1, S2, no gallop or murmur Lung: clear to ausculation and percussion Vascular: all pulses full without bruits  Abdominal: nondistended, nontender, good bowel sounds, no HSM, no bruits Extremities: no cyanosis, clubbing or edema, no sign of DVT, no varicosities  Skin: normal color, no rashes Neuro: alert and oriented x 3, non-focal Pysch: normal affect  EKG  BMET    Component Value Date/Time   NA 141 11/06/2010 1014   K 4.2 11/06/2010 1014   CL 105 11/06/2010 1014   CO2 31 11/06/2010 1014   GLUCOSE 96 11/06/2010 1014   BUN 19 11/06/2010 1014   CREATININE 0.8 11/06/2010 1014   CALCIUM 9.1 11/06/2010 1014   GFRNONAA >60 10/28/2009 0515   GFRAA  Value: >60        The eGFR has been calculated using the MDRD equation. This calculation has not been validated in all clinical situations. eGFR's persistently <60 mL/min signify possible Chronic Kidney Disease. 10/28/2009 0515    Lipid Panel     Component Value Date/Time   CHOL 153 11/06/2010 1014   TRIG 34.0 11/06/2010 1014   HDL 82.40 11/06/2010 1014   CHOLHDL 2 11/06/2010 1014  VLDL 6.8 11/06/2010 1014   LDLCALC 64 11/06/2010 1014    CBC    Component Value Date/Time   WBC 8.0 10/28/2009 0515   RBC 4.05* 10/28/2009 0515   HGB 12.0* 10/28/2009 0515   HCT 37.3* 10/28/2009 0515   PLT 273 10/28/2009 0515   MCV 92.1 10/28/2009 0515   MCH 29.6 10/28/2009 0515   MCHC 32.2 10/28/2009 0515   RDW 13.3 10/28/2009 0515   LYMPHSABS 0.9 10/25/2009 1506   MONOABS 1.3* 10/25/2009 1506   EOSABS 0.0 10/25/2009 1506   BASOSABS 0.0 10/25/2009 1506

## 2011-05-21 NOTE — Assessment & Plan Note (Signed)
At goal. Patient strongly advised to see Dr. Clovis Riley once a year for blood work. He will need lipids and LFTs drawn in October.

## 2011-05-21 NOTE — Assessment & Plan Note (Signed)
Stable. Continue aggressive secondary preventive therapy. 

## 2011-07-04 ENCOUNTER — Other Ambulatory Visit: Payer: Self-pay | Admitting: Cardiology

## 2011-07-12 ENCOUNTER — Ambulatory Visit: Payer: Medicare Other | Attending: Orthopedic Surgery

## 2011-07-12 DIAGNOSIS — IMO0001 Reserved for inherently not codable concepts without codable children: Secondary | ICD-10-CM | POA: Insufficient documentation

## 2011-07-12 DIAGNOSIS — M25579 Pain in unspecified ankle and joints of unspecified foot: Secondary | ICD-10-CM | POA: Insufficient documentation

## 2011-07-19 ENCOUNTER — Ambulatory Visit: Payer: Medicare Other

## 2011-07-20 ENCOUNTER — Ambulatory Visit (INDEPENDENT_AMBULATORY_CARE_PROVIDER_SITE_OTHER): Payer: Medicare Other | Admitting: Sports Medicine

## 2011-07-20 ENCOUNTER — Encounter: Payer: Self-pay | Admitting: Sports Medicine

## 2011-07-20 VITALS — BP 150/71 | HR 66 | Ht 71.0 in | Wt 172.0 lb

## 2011-07-20 DIAGNOSIS — M79609 Pain in unspecified limb: Secondary | ICD-10-CM

## 2011-07-20 DIAGNOSIS — M6789 Other specified disorders of synovium and tendon, multiple sites: Secondary | ICD-10-CM

## 2011-07-20 DIAGNOSIS — M79673 Pain in unspecified foot: Secondary | ICD-10-CM

## 2011-07-20 DIAGNOSIS — M76829 Posterior tibial tendinitis, unspecified leg: Secondary | ICD-10-CM

## 2011-07-20 NOTE — Progress Notes (Addendum)
  Subjective:    Patient ID: Jesus Harmon, male    DOB: August 20, 1929, 78 y.o.   MRN: [id number]  HPI patient is a pleasant 78 year old male that comes in today complaining of 3-4 months of lateral left foot pain. No injury that he can recall but gradual onset of pain that is worse with weightbearing and walking. Symptoms resolve at rest. He's not noticed any swelling no numbness or tingling and no previous trauma to the foot although he does admit that he had an old ankle fracture and was treated conservatively years ago. He denies any pain in the medial ankle although he has noticed an increased FlatFoot as he is aged. He was most recently seen at [facility] orthopedics where he had both x-rays and an MRI done. Has been treated with physical therapy and some sort of topical anti-inflammatory and he was referred here for consideration of orthotics.    Review of Systems     Objective:   Physical Exam Well-developed and well-nourished, no acute distress. Examination of each of his feet in the standing position shows markedly pes planus on the left, mild to moderate pes planus on the right. "Too many toes sign "on the left with weakness when standing on his tiptoes. He does have some loss of the normal calcaneal inversion and stenting of his tiptoes on the left. Flexible subtalar joint. He is tender to palpation along the course of the peroneal tendons laterally both posterior to the lateral malleolus as well as at the insertion on the navicular. No soft tissue swelling, no erythema. He is neurovascularly intact distally and walking without a limp.       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 Lateral left foot pain likely secondary to compressive forces from #1 as well as peroneal tendon tendinitis/tendinopathy.  Going to fit the patient with a pair of sports insoles with the navicular pad on the left. Hoping to unload the lateral pressure in the left ankle. I  think he should continue with physical therapy where they are utilizing iontophoresis and I think he should continue with his topical anti-inflammatory. This patient will likely need a pair of custom orthotics I want to ensure that he at least get some symptom relief with sports insoles first. He will followup with me in 4 weeks at which time we will likely construct custom orthotics. In the meantime, I would like to obtain the records from [facility] orthopedics. Patient sign a records release today.

## 2011-07-26 ENCOUNTER — Ambulatory Visit: Payer: Medicare Other | Admitting: Rehabilitation

## 2011-07-30 ENCOUNTER — Ambulatory Visit: Payer: Medicare Other | Admitting: Rehabilitation

## 2011-08-02 ENCOUNTER — Ambulatory Visit: Payer: Medicare Other | Admitting: Rehabilitation

## 2011-08-04 ENCOUNTER — Encounter: Payer: Medicare Other | Admitting: Rehabilitation

## 2011-08-06 ENCOUNTER — Ambulatory Visit: Payer: Medicare Other | Admitting: Rehabilitation

## 2011-08-13 ENCOUNTER — Ambulatory Visit: Payer: Medicare Other | Attending: Orthopedic Surgery | Admitting: Rehabilitation

## 2011-08-13 DIAGNOSIS — IMO0001 Reserved for inherently not codable concepts without codable children: Secondary | ICD-10-CM | POA: Insufficient documentation

## 2011-08-13 DIAGNOSIS — M25579 Pain in unspecified ankle and joints of unspecified foot: Secondary | ICD-10-CM | POA: Insufficient documentation

## 2011-08-17 ENCOUNTER — Encounter: Payer: Self-pay | Admitting: Sports Medicine

## 2011-08-17 ENCOUNTER — Ambulatory Visit (INDEPENDENT_AMBULATORY_CARE_PROVIDER_SITE_OTHER): Payer: Medicare Other | Admitting: Sports Medicine

## 2011-08-17 VITALS — BP 148/71 | HR 61 | Ht 71.0 in | Wt 174.0 lb

## 2011-08-17 DIAGNOSIS — M6789 Other specified disorders of synovium and tendon, multiple sites: Secondary | ICD-10-CM

## 2011-08-17 DIAGNOSIS — M76829 Posterior tibial tendinitis, unspecified leg: Secondary | ICD-10-CM

## 2011-08-17 NOTE — Progress Notes (Signed)
  Subjective:    Patient ID: Jesus Harmon, male    DOB: November 28, 1929, 78 y.o.   MRN: [id number]  HPI Mr. Silverman comes in today for followup. He has noticed some improvement with his foot pain with the scaphoid pad and sports insoles, but still has quite a bit of discomfort with long periods of standing. I did receive the records from [facility] orthopedics and reviewed those. An MRI scan done by Dr. Lestine Box in June of this year showed a mild stress reaction of the cuboid, first and fourth metatarsal bases, and medial hallux seasmoid. 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 The patient has noted that the orthotic tends to make a shoe tight and he has ordered new shoes which will arrive later this week.    Review of Systems     Objective:   Physical Exam Exam is basically unchanged from prior exam He is well-developed well-nourished, no acute distress He has loss of the longitudinal arches bilaterally with standing. Too many toes sign bilaterally, left greater than right. He has pain and weakness with standing on his tiptoes on the left. Minimal calcaneal inversion on the left. Good calcaneal inversion on the right. He is tender to palpation along the distal fourth metatarsal. No soft tissue swell. Flexible subtalar joint. Walks with a slight limp.       Assessment & Plan:  Left foot pain secondary to grade 2 posterior tibialis tendon dysfunction  Patient will return to the clinic later this week with his new shoes. We will proceed with a custom orthotic at that. He has had 4 weeks of physical therapy and feels like he has plateaued. Therefore, he can discontinue formal therapy but understands the importance of continuing with the home exercise program. I did give him an arch strap to try as well.

## 2011-08-19 ENCOUNTER — Telehealth: Payer: Self-pay | Admitting: Cardiology

## 2011-08-19 NOTE — Telephone Encounter (Signed)
Called pt's daughter and explained lisinopril does cause cough,which eventually goes away,, but advised i would make  dr wall and his nurse aware of problem and if changes were needed  they would give him a call--daughter agrees--nt

## 2011-08-19 NOTE — Telephone Encounter (Signed)
New problem:   Jesus Harmon - Daughter calling.   Dr. Clovis Riley suggest patient coming off blood pressure med's- lisinopril  due to continue cough.  Chest xray was negative.

## 2011-08-19 NOTE — Telephone Encounter (Signed)
Fu call Pt's daughter called back and he does want to change to another med

## 2011-08-20 ENCOUNTER — Telehealth: Payer: Self-pay | Admitting: Cardiology

## 2011-08-20 ENCOUNTER — Ambulatory Visit: Payer: Medicare Other | Admitting: Sports Medicine

## 2011-08-20 MED ORDER — LOSARTAN POTASSIUM 25 MG PO TABS: 25.0000 mg | ORAL_TABLET | Freq: Every day | ORAL | Status: DC

## 2011-08-20 NOTE — Telephone Encounter (Signed)
I spoke with pt wife about discontinuing lisinopril. Dr. Daleen Squibb recommended Losartan 25mg  1 tablet daily. Pt will pick up prescription today. He has not taken Lisinopril today Mylo Red RN

## 2011-08-20 NOTE — Telephone Encounter (Signed)
Pt calling re having a cough, pcp told him to stop lisinopril , doesn't want to go off until dr wall ok's it, pls call

## 2011-08-20 NOTE — Telephone Encounter (Signed)
LMTCB Debbie Layli Capshaw RN  

## 2011-09-05 ENCOUNTER — Other Ambulatory Visit: Payer: Self-pay | Admitting: Cardiology

## 2011-09-06 ENCOUNTER — Ambulatory Visit (INDEPENDENT_AMBULATORY_CARE_PROVIDER_SITE_OTHER): Payer: Medicare Other | Admitting: Sports Medicine

## 2011-09-06 VITALS — BP 138/70 | Ht 72.0 in | Wt 175.0 lb

## 2011-09-06 DIAGNOSIS — M2140 Flat foot [pes planus] (acquired), unspecified foot: Secondary | ICD-10-CM

## 2011-09-06 DIAGNOSIS — M6789 Other specified disorders of synovium and tendon, multiple sites: Secondary | ICD-10-CM

## 2011-09-06 DIAGNOSIS — M76829 Posterior tibial tendinitis, unspecified leg: Secondary | ICD-10-CM

## 2011-09-06 NOTE — Progress Notes (Signed)
  Subjective:    Patient ID: Jesus Harmon, male    DOB: 1929-09-16, 78 y.o.   MRN: [id number]  HPI [name] comes in today for custom orthotics. Sports insoles have been somewhat helpful but certainly not curative. He has a history of significant posterior tibialis tendon dysfunction. Please see previous notes for full history. He has noticed some benefit from the arch strap on the left foot.    Review of Systems     Objective:   Physical Exam Markedly pes planus with too many toes sign on the left. Significant calcaneal valgus on the left. No soft tissue swelling. Walks with a slight limp.       Assessment & Plan:  1. Bilateral posterior tibialis tendon dysfunction left greater than right 2. Pes planus secondary to #1  Patient was fitted with custom orthotics today. I explained to him that this will be an ongoing process to adjust his orthotics and try to make him as comfortable as possible. He'll return to the office in 3 weeks but sooner if he is having problems with his orthotics in the interim.  Patient was fitted for a : standard, cushioned, semi-rigid orthotic. The orthotic was heated and afterward the patient stood on the orthotic blank positioned on the orthotic stand. The patient was positioned in subtalar neutral position and 10 degrees of ankle dorsiflexion in a weight bearing stance. After completion of molding, a stable base was applied to the orthotic blank. The blank was ground to a stable position for weight bearing. Size:13 Base: Blue EVA Posting and Padding: B/L 1st ray posts and a medial heel wedge on the left.  Patient had a much more neutral gait with his new orthotics. Calcaneal valgus on the left was better corrected with medial heel wedge. Afterwards, patient seemed to be comfortable. F/U as above.

## 2011-09-12 ENCOUNTER — Other Ambulatory Visit: Payer: Self-pay | Admitting: Cardiology

## 2011-09-27 ENCOUNTER — Encounter: Payer: Self-pay | Admitting: Sports Medicine

## 2011-09-27 ENCOUNTER — Ambulatory Visit (INDEPENDENT_AMBULATORY_CARE_PROVIDER_SITE_OTHER): Payer: Medicare Other | Admitting: Sports Medicine

## 2011-09-27 VITALS — BP 124/70 | Ht 72.0 in | Wt 175.0 lb

## 2011-09-27 DIAGNOSIS — M76829 Posterior tibial tendinitis, unspecified leg: Secondary | ICD-10-CM

## 2011-09-27 DIAGNOSIS — M6789 Other specified disorders of synovium and tendon, multiple sites: Secondary | ICD-10-CM

## 2011-09-27 NOTE — Progress Notes (Signed)
  Subjective:    Patient ID: Jesus Harmon, male    DOB: 13-Dec-1929, 78 y.o.   MRN: [id number]  HPI chief complaint: Followup on new orthotics  Patient comes in today for followup on his new orthotics. He is doing very well. Orthotics are comfortable and do seem to help with his pain from his posterior tibialis tendon. He does notice however that his pain returns if he does not wear the inserts. He seems to be happy with his orthotics. He is asking about the possibility of making a pair for a number pair of shoes that he has.    Review of Systems     Objective:   Physical Exam Well-developed, well-nourished. No acute distress there  Once again demonstrated is significant posterior tibialis tendon dysfunction bilaterally. His gait is not completely corrected with his orthotic but it is much improved.       Assessment & Plan:  1. Bilateral foot pain secondary to significant posterior tibialis tendon dysfunction  Patient is happy with his orthotics. They are comfortable. He understands that we are not correcting the structure of his foot, but simply providing support for it with the orthotic. Therefore he can expect some pain when wearing shoes without the orthotic in it. I would be happy to construct a pair of orthotics for his other shoes if he would like. He'll followup when necessary

## 2011-10-28 ENCOUNTER — Telehealth: Payer: Self-pay | Admitting: Cardiology

## 2011-10-28 NOTE — Telephone Encounter (Signed)
pt was told dr wall couldn't do labs here anymore that pcp needed to do it, dr dean Clovis Riley, and they have his labs "all messed up" so dtr would like order faxed to them @ [phone] and dtr mary requesting a call when faxed @ [phone]

## 2011-11-03 NOTE — Telephone Encounter (Signed)
I spoke with daughter, [name], after faxing lab orders.  Her father had his lab work drawn at his pcp last week as per Dr. Daleen Squibb request when he saw pt in May of this year. Mylo Red RN

## 2011-11-06 ENCOUNTER — Other Ambulatory Visit: Payer: Self-pay | Admitting: Cardiology

## 2011-11-09 ENCOUNTER — Other Ambulatory Visit: Payer: Self-pay | Admitting: Cardiology

## 2011-12-20 ENCOUNTER — Telehealth: Payer: Self-pay | Admitting: Cardiology

## 2011-12-20 NOTE — Telephone Encounter (Signed)
Spoke to patient's daughter Jesus Harmon she stated father has been complaining of urinating more frequently since starting on losartan.Stated he has been complaining 2 weeks after starting but she told him to wait and give it some time.States he continues to complain.Message sent to Dr.Wall for advice.Daughter's cell # [phone].

## 2011-12-20 NOTE — Telephone Encounter (Signed)
plz return call to pt Daughter [name]  [phone], regarding med change

## 2011-12-21 NOTE — Telephone Encounter (Signed)
I tried to call daughter but unavailable. Not aware of problem with increased urination with losarten. Had cough with lisinopril. Seems to playing off doctors. Needs to have increased urination checked by Dr Clovis Riley.

## 2012-01-14 ENCOUNTER — Other Ambulatory Visit: Payer: Self-pay | Admitting: Emergency Medicine

## 2012-01-14 MED ORDER — SIMVASTATIN 20 MG PO TABS: 20.0000 mg | ORAL_TABLET | Freq: Every day | ORAL | Status: DC

## 2012-01-17 ENCOUNTER — Telehealth: Payer: Self-pay | Admitting: Cardiology

## 2012-01-17 NOTE — Telephone Encounter (Signed)
Patient's daughter Jesus Harmon called.She stated father wants to go back to previous B/P medication.States Losartan is causing a cough and he is urinating frequently.Daughter was told Dr.Wall out of office today,will forward message to him for advice.

## 2012-01-17 NOTE — Telephone Encounter (Signed)
New Problem:    Patient's daughter called in to follow-up about changing the patient's BP meds.  Please call back.

## 2012-01-19 ENCOUNTER — Telehealth: Payer: Self-pay | Admitting: Cardiology

## 2012-01-19 NOTE — Telephone Encounter (Signed)
Pt's dtr calling back, pt out of med and needs to know about med change to get refill

## 2012-01-19 NOTE — Telephone Encounter (Signed)
Please see below and advise. thanks 

## 2012-01-19 NOTE — Telephone Encounter (Signed)
Patient's daughter [name] called no answer.LMTC.

## 2012-01-19 NOTE — Telephone Encounter (Signed)
Okay with me 

## 2012-01-19 NOTE — Telephone Encounter (Signed)
Time with me.

## 2012-01-19 NOTE — Telephone Encounter (Signed)
Pt's new BP med makes him urinate more at night, where previous med caused him to cough, however its been 6 months and he is still having the cough, so he wants to go back on the first one , needs rx [facility] [address],  pls call with change [phone]

## 2012-01-20 MED ORDER — LISINOPRIL 10 MG PO TABS: 10.0000 mg | ORAL_TABLET | Freq: Every day | ORAL | Status: DC

## 2012-01-20 NOTE — Telephone Encounter (Signed)
Pt needs b/p meds called into [facility] [address]

## 2012-01-20 NOTE — Telephone Encounter (Signed)
Spoke with pt dtr, lisinopril 10 mg sent to pharm

## 2012-01-20 NOTE — Telephone Encounter (Signed)
Spoke to patient's daughter [name] was told ok with Dr.Wall to go back on lisinopril 10 mg daily and stop losartan.[facility] [address].

## 2012-03-03 ENCOUNTER — Other Ambulatory Visit: Payer: Self-pay | Admitting: Cardiology

## 2012-03-04 ENCOUNTER — Telehealth: Payer: Self-pay | Admitting: Physician Assistant

## 2012-03-04 NOTE — Telephone Encounter (Signed)
Pt daughter called because pt had chest pain with exertion and took SL NTG x 2.   Pt has not had any recent episodes of chest pain. This one occurred with raking leaves and resolved after the second NTG. His previous angina symptoms. Has had no resting pain.   Advised them pt could come in to ER by EMS, but he prefers to have OP evaluation.   Since pt is pain-free, advised OK to wait till Monday. If pt has any further symptoms, he will need to come in by EMS. Otherwise, office to contact him on Monday and have him come in for appt. He had 70% LAD in 2011, may need repeat cath.  Records faxed.

## 2012-03-06 ENCOUNTER — Telehealth: Payer: Self-pay | Admitting: Cardiology

## 2012-03-06 NOTE — Telephone Encounter (Signed)
Pt's dtr called re pt having chest pain sat took two nitro, went to ER , told to call this am, if he needs to be seen does not want to see dr Elease Hashimoto,

## 2012-03-06 NOTE — Telephone Encounter (Signed)
Pt's daughter called because pt had chest pain with excerption Saturday relieved with  Two NTG SL. Jorja Loa PN recommended for pt to call the office for an appointment. Pt is doing fine today no chest pain at this time. An appointment was made for pt on 03/09/12 at 20:00 pm with Tereso Newcomer PA. Pt and daughter  aware.

## 2012-03-09 ENCOUNTER — Encounter: Payer: Self-pay | Admitting: Physician Assistant

## 2012-03-09 ENCOUNTER — Ambulatory Visit (INDEPENDENT_AMBULATORY_CARE_PROVIDER_SITE_OTHER): Payer: Medicare Other | Admitting: Physician Assistant

## 2012-03-09 VITALS — BP 136/64 | HR 64 | Ht 72.0 in | Wt 176.4 lb

## 2012-03-09 DIAGNOSIS — R079 Chest pain, unspecified: Secondary | ICD-10-CM

## 2012-03-09 DIAGNOSIS — I10 Essential (primary) hypertension: Secondary | ICD-10-CM

## 2012-03-09 DIAGNOSIS — I2510 Atherosclerotic heart disease of native coronary artery without angina pectoris: Secondary | ICD-10-CM

## 2012-03-09 DIAGNOSIS — E785 Hyperlipidemia, unspecified: Secondary | ICD-10-CM

## 2012-03-09 LAB — CBC WITH DIFFERENTIAL/PLATELET
Basophils Absolute: 0.1 10*3/uL (ref 0.0–0.1)
HCT: 37.4 % — ABNORMAL LOW (ref 39.0–52.0)
Lymphs Abs: 1.7 10*3/uL (ref 0.7–4.0)
MCHC: 33 g/dL (ref 30.0–36.0)
MCV: 88.9 fl (ref 78.0–100.0)
Monocytes Absolute: 0.7 10*3/uL (ref 0.1–1.0)
Platelets: 255 10*3/uL (ref 150.0–400.0)
RDW: 12.9 % (ref 11.5–14.6)

## 2012-03-09 LAB — BASIC METABOLIC PANEL
BUN: 18 mg/dL (ref 6–23)
Chloride: 103 mEq/L (ref 96–112)
Glucose, Bld: 96 mg/dL (ref 70–99)
Potassium: 3.7 mEq/L (ref 3.5–5.1)

## 2012-03-09 MED ORDER — ISOSORBIDE MONONITRATE ER 30 MG PO TB24: 30.0000 mg | ORAL_TABLET | Freq: Every day | ORAL | Status: DC

## 2012-03-09 NOTE — Patient Instructions (Addendum)
Your physician recommends that you return for lab work today: BMET, CBC w/Diff  Your physician has requested that you have en exercise stress myoview. For further information please visit [email or web address] Please follow instruction sheet, as given.  Your physician recommends that you schedule a follow-up appointment in:  2 weeks with Tereso Newcomer [facility] same day Dr. Daleen Squibb is in the office.  Start Imdur 30 mg by mouth daily. Rx sent to pharmacy.

## 2012-03-09 NOTE — Progress Notes (Signed)
[address] [place], [place]  [address] Phone: [phone], Fax:  [phone]  Date:  03/09/2012   ID:  Jesus Harmon, DOB 1929/02/15, MRN [id number]  PCP:  Benita Stabile, MD  Primary Cardiologist:  Dr. Valera Castle     History of Present Illness: Jesus Harmon is a 79 y.o. male who returns for the evaluation of chest pain.  He has a hx of CAD, HTN, HL.  LHC 10/2009:  pLAD 50%, small D1 70-80% (too small for PCI), EF 65%.  Myoview 10/2009: EF 65%, no ischemia.  Last week while raking leaves, he developed substernal chest pressure and assoc dyspnea.  He took NTG x 2 with relief.  This is the first time he has had chest pain since his LHC.  No assoc nausea, diaphoresis, syncope.  He does note some increased DOE over the last several mos as well as fatigue.  He is probably NYHA Class II-IIb.  No orthopnea, PND, edema.    Labs (10/11):  Hgb 12, TSH 1.649 Labs (10/12):  K 4.2, creatinine 0.8, ALT 19, LDL 64   Wt Readings from Last 3 Encounters:  09/27/11 175 lb (79.379 kg)  09/06/11 175 lb (79.379 kg)  08/17/11 174 lb (78.926 kg)     Past Medical History  Diagnosis Date  . CAD (coronary artery disease)     a. LHC 10/2009:  pLAD 50%, small D1 70-80% (too small for PCI), EF 65% => med Rx  b. Myoview 10/2009: EF 65%, no ischemia.  Marland Kitchen HTN (hypertension)   . HLD (hyperlipidemia)     Current Outpatient Prescriptions  Medication Sig Dispense Refill  . aspirin 81 MG tablet Take 81 mg by mouth daily.        Marland Kitchen latanoprost (XALATAN) 0.005 % ophthalmic solution 1 drop at bedtime.        Marland Kitchen lisinopril (PRINIVIL,ZESTRIL) 10 MG tablet Take 1 tablet (10 mg total) by mouth daily.  30 tablet  6  . NITROSTAT 0.4 MG SL tablet 1 TABLET UNDER TONGUE AT ONSET OF CHEST PAIN YOU MAY REPEAT EVERY 5 MINUTES FOR UP TO 3 DOSES.  25 tablet  1  . simvastatin (ZOCOR) 20 MG tablet TAKE 1 TABLET EVERY DAY  30 tablet  1  . simvastatin (ZOCOR) 20 MG tablet Take 1 tablet (20 mg total)  by mouth at bedtime.  30 tablet  3  . simvastatin (ZOCOR) 20 MG tablet TAKE 1 TABLET EVERY DAY  30 tablet  5   No current facility-administered medications for this visit.    Allergies:    Allergies  Allergen Reactions  . Amoxicillin     Swelling and rash to face   . Sulfonamide Derivatives     Social History:  The patient  reports that he quit smoking about 41 years ago. He has never used smokeless tobacco. He reports that he does not drink alcohol or use illicit drugs.   ROS:  Please see the history of present illness.   He has a chronic cough that is nonproductive.  Feels cold all the time. No fevers. Questions if he has dysphagia.  No melena, hematochezia.   All other systems reviewed and negative.   PHYSICAL EXAM: VS:  BP 136/64  Pulse 64  Ht 6' (1.829 m)  Wt 176 lb 6.4 oz (80.015 kg)  BMI 23.92 kg/m2 Well nourished, well developed, in no acute distress HEENT: normal Neck: no JVD Vascular:  No carotid bruits Cardiac:  normal S1,  S2; RRR; no murmur Lungs:  clear to auscultation bilaterally, no wheezing, rhonchi or rales Abd: soft, nontender, no hepatomegaly Ext: no edema Skin: warm and dry Neuro:  CNs 2-12 intact, no focal abnormalities noted  EKG:  NSR, HR 64, LAD, iRBBB, NSSTTW changes     ASSESSMENT AND PLAN:  1. Chest Pain:  Symptoms consistent with exertional angina.  He had mod LAD disease and mod to severe disease in a small Dx in 2011 at [facility].  Nuclear scan demonstrated no ischemia.  We had a long discussion regarding further evaluation.  In light of his advanced age, I recommend proceeding with an ETT-Myoview to assess for ischemia.  I will also advance medical Rx and place him on Imdur 30 mg QD.  He does not take PDE-5 inhibitors.  He has baseline HR of 64 and evidence of conduction system disease on ECG.  I do not think he could tolerate a beta blocker.  Will bring him back in close follow up to review his stress test.  If low risk and he feels better on long  acting nitrates, would continue with medical Rx only (COURAGE Trial).  Check BMET and CBC today.  2. Coronary Artery Disease:  Continue ASA, statin.  Add Imdur.  Proceed with myoview as noted. 3. Hypertension:  Controlled.  Continue current therapy.  4. Hyperlipidemia:  Managed by PCP.  5. Disposition:  Follow up with Dr. Valera Castle or me in 2 weeks.   Signed, Tereso Newcomer, PA-C  1:58 PM 03/09/2012

## 2012-03-10 ENCOUNTER — Telehealth: Payer: Self-pay | Admitting: *Deleted

## 2012-03-10 NOTE — Telephone Encounter (Signed)
Message copied by Tarri Fuller on Fri Mar 10, 2012  1:57 PM ------      Message from: [place], [place] T      Created: Thu Mar 09, 2012 10:14 PM       Labs ok      Continue with current treatment plan.      Tereso Newcomer, PA-C  10:14 PM 03/09/2012 ------

## 2012-03-15 ENCOUNTER — Ambulatory Visit (HOSPITAL_COMMUNITY): Payer: Medicare Other | Attending: Cardiology | Admitting: Radiology

## 2012-03-15 VITALS — BP 136/63 | Ht 72.0 in | Wt 178.0 lb

## 2012-03-15 DIAGNOSIS — R0602 Shortness of breath: Secondary | ICD-10-CM

## 2012-03-15 DIAGNOSIS — R079 Chest pain, unspecified: Secondary | ICD-10-CM | POA: Insufficient documentation

## 2012-03-15 MED ORDER — TECHNETIUM TC 99M SESTAMIBI GENERIC - CARDIOLITE
11.0000 | Freq: Once | INTRAVENOUS | Status: AC | PRN
  Administered 2012-03-15: 11 via INTRAVENOUS

## 2012-03-15 MED ORDER — TECHNETIUM TC 99M SESTAMIBI GENERIC - CARDIOLITE
33.0000 | Freq: Once | INTRAVENOUS | Status: AC | PRN
  Administered 2012-03-15: 33 via INTRAVENOUS

## 2012-03-15 NOTE — Progress Notes (Signed)
[facility] SITE 3 NUCLEAR MED [address] [place], [place] [address] [phone]    Cardiology Nuclear Med Study  Jesus Harmon is a 79 y.o. male     MRN : [id number]     DOB: 07/31/1929  Procedure Date: 03/15/2012  Nuclear Med Background Indication for Stress Test:  Evaluation for Ischemia History:  10/2009 Heart Cath-LAD 50% EF: 65% small D! (70-80) too small for PCI  RX Tx, MPS: (-) ischemia (-) infarct Cardiac Risk Factors: History of Smoking, Hypertension, Lipids and RBBB  Symptoms:  Chest Pain, Chest Pressure, DOE, Fatigue and SOB   Nuclear Pre-Procedure Caffeine/Decaff Intake:  None NPO After: 10:00pm   Lungs:  clear O2 Sat: 97% on room air. IV 0.9% NS with Angio Cath:  22g  IV Site: R Antecubital  IV Started by:  Bonnita Levan, RN  Chest Size (in):  42 Cup Size: n/a  Height: 6' (1.829 m)  Weight:  178 lb (80.74 kg)  BMI:  Body mass index is 24.14 kg/(m^2). Tech Comments:  N/A    Nuclear Med Study 1 or 2 day study: 1 day  Stress Test Type:  Stress  Reading MD: Marca Ancona, MD  Order Authorizing Jeancarlo Leffler:  Valera Castle, MD  Resting Radionuclide: Technetium 59m Sestamibi  Resting Radionuclide Dose: 11.0 mCi   Stress Radionuclide:  Technetium 26m Sestamibi  Stress Radionuclide Dose: 33.0 mCi           Stress Protocol Rest HR: 57 Stress HR: 146  Rest BP: 136/63 Stress BP: 219/95  Exercise Time (min): 5:00 METS: 7.0   Predicted Max HR: 140 bpm % Max HR: 104.29 bpm Rate Pressure Product: 21308   Dose of Adenosine (mg):  n/a Dose of Lexiscan: n/a mg  Dose of Atropine (mg): n/a Dose of Dobutamine: n/a mcg/kg/min (at max HR)  Stress Test Technologist: Milana Na, EMT-P  Nuclear Technologist:  Domenic Polite, CNMT     Rest Procedure:  Myocardial perfusion imaging was performed at rest 45 minutes following the intravenous administration of Technetium 20m Sestamibi. Rest ECG: NSR-RBBB  Stress Procedure:  The patient exercised on the treadmill  utilizing the Bruce Protocol for 5:00 minutes. The patient stopped due to sob, fatigue, a rare pac, and denied any chest pain.  Technetium 80m Sestamibi was injected at peak exercise and myocardial perfusion imaging was performed after a brief delay. Stress ECG: No significant change from baseline ECG  QPS Raw Data Images:  Normal; no motion artifact; normal heart/lung ratio. Stress Images:  Normal homogeneous uptake in all areas of the myocardium. Rest Images:  Normal homogeneous uptake in all areas of the myocardium. Subtraction (SDS):  There is no evidence of scar or ischemia. Transient Ischemic Dilatation (Normal <1.22):  0.99 Lung/Heart Ratio (Normal <0.45):  0.20  Quantitative Gated Spect Images QGS EDV:  107 ml QGS ESV:  39 ml  Impression Exercise Capacity:  Fair exercise capacity. BP Response:  Hypertensive blood pressure response. Clinical Symptoms:  Shortness of breath.  ECG Impression:  NSR with RBBB, no change with exercise.  Comparison with Prior Nuclear Study: No significant change from previous study  Overall Impression:  Normal stress nuclear study except hypertensive BP response.   LV Ejection Fraction: 63%.  LV Wall Motion:  NL LV Function; NL Wall Motion  Marca Ancona 03/15/2012

## 2012-03-17 ENCOUNTER — Encounter: Payer: Self-pay | Admitting: Physician Assistant

## 2012-03-17 ENCOUNTER — Telehealth: Payer: Self-pay | Admitting: *Deleted

## 2012-03-17 NOTE — Telephone Encounter (Signed)
no answer; I will try again on monday 3./10

## 2012-03-17 NOTE — Telephone Encounter (Signed)
Message copied by Tarri Fuller on Fri Mar 17, 2012 12:15 PM ------      Message from: [place], [place] T      Created: Fri Mar 17, 2012 11:09 AM       Please inform patient stress test normal.      Tereso Newcomer, PA-C  11:09 AM 03/17/2012 ------

## 2012-03-21 ENCOUNTER — Encounter: Payer: Self-pay | Admitting: Cardiology

## 2012-03-21 ENCOUNTER — Ambulatory Visit (INDEPENDENT_AMBULATORY_CARE_PROVIDER_SITE_OTHER): Payer: Medicare Other | Admitting: Cardiology

## 2012-03-21 VITALS — BP 128/66 | HR 68 | Ht 72.0 in | Wt 175.0 lb

## 2012-03-21 DIAGNOSIS — I2510 Atherosclerotic heart disease of native coronary artery without angina pectoris: Secondary | ICD-10-CM

## 2012-03-21 NOTE — Patient Instructions (Addendum)
Continue taking current medications as prescribed  Your physician recommends that you schedule a follow-up appointment in: 1 year with Dr. Tonny Bollman  You may stop taking your Isosorbide (Imdur)

## 2012-03-21 NOTE — Assessment & Plan Note (Signed)
Stable. Discontinue isosorbide. Patient and daughter would like to followup with Dr. Excell Seltzer in one year.

## 2012-03-21 NOTE — Progress Notes (Signed)
Jesus Harmon returns today for evaluation and management of his chest pain. He's had no further discomfort. Stress Myoview was completely normal except for a hypertensive blood pressure response to exercise. He would like to stop his isosorbide which we'll do today. Blood work was also unremarkable.  No change on physical examination.

## 2012-03-24 ENCOUNTER — Encounter: Payer: Self-pay | Admitting: Cardiology

## 2012-09-04 ENCOUNTER — Other Ambulatory Visit: Payer: Self-pay | Admitting: Cardiology

## 2012-09-06 ENCOUNTER — Other Ambulatory Visit: Payer: Self-pay | Admitting: Cardiology

## 2013-02-22 ENCOUNTER — Other Ambulatory Visit: Payer: Self-pay | Admitting: Cardiology

## 2013-02-25 ENCOUNTER — Other Ambulatory Visit: Payer: Self-pay | Admitting: Cardiology

## 2013-04-03 ENCOUNTER — Encounter: Payer: Self-pay | Admitting: Cardiovascular Disease

## 2013-04-03 ENCOUNTER — Ambulatory Visit (INDEPENDENT_AMBULATORY_CARE_PROVIDER_SITE_OTHER): Payer: Medicare Other | Admitting: Cardiovascular Disease

## 2013-04-03 VITALS — BP 130/70 | HR 56 | Ht 72.0 in | Wt 169.0 lb

## 2013-04-03 DIAGNOSIS — I2510 Atherosclerotic heart disease of native coronary artery without angina pectoris: Secondary | ICD-10-CM

## 2013-04-03 DIAGNOSIS — R0602 Shortness of breath: Secondary | ICD-10-CM

## 2013-04-03 DIAGNOSIS — E785 Hyperlipidemia, unspecified: Secondary | ICD-10-CM

## 2013-04-03 DIAGNOSIS — I10 Essential (primary) hypertension: Secondary | ICD-10-CM

## 2013-04-03 NOTE — Patient Instructions (Signed)
Your physician recommends that you continue on your current medications as directed. Please refer to the Current Medication list given to you today.  Your physician wants you to follow-up in: 1 year with Dr. Cooper.  You will receive a reminder letter in the mail two months in advance. If you don't receive a letter, please call our office to schedule the follow-up appointment.   

## 2013-04-06 ENCOUNTER — Encounter: Payer: Self-pay | Admitting: Cardiovascular Disease

## 2013-04-07 ENCOUNTER — Encounter: Payer: Self-pay | Admitting: Cardiovascular Disease

## 2013-04-07 NOTE — Progress Notes (Signed)
    HPI:  80 year old male presenting for one year follow-up. He's been followed for CAD by Dr Verl Blalock. He has had moderate CAD and has done well with medical management. He had chest pain with exertion last year and underwent a stress Myoview which was normal. No further problems since that time. Denies chest pain, shortness of breath. Works out in the yard and does experience fatigue. No edema, orthopnea, or PND. Here with family today and they would like him to slow down a bit. He still works outside for several hours at a time.   Outpatient Encounter Prescriptions as of 04/03/2013  Medication Sig  . aspirin 81 MG tablet Take 81 mg by mouth daily.    Marland Kitchen latanoprost (XALATAN) 0.005 % ophthalmic solution 1 drop at bedtime.    Marland Kitchen lisinopril (PRINIVIL,ZESTRIL) 10 MG tablet Take 1 tablet (10 mg total) by mouth daily.  Marland Kitchen NITROSTAT 0.4 MG SL tablet 1 TABLET UNDER TONGUE AT ONSET OF CHEST PAIN YOU MAY REPEAT EVERY 5 MINUTES FOR UP TO 3 DOSES.  Marland Kitchen oxybutynin (DITROPAN-XL) 5 MG 24 hr tablet Take 5 mg by mouth at bedtime.   . simvastatin (ZOCOR) 20 MG tablet TAKE 1 TABLET BY MOUTH DAILY  . [DISCONTINUED] lisinopril (PRINIVIL,ZESTRIL) 10 MG tablet TAKE 1 TABLET EVERY DAY  . [DISCONTINUED] montelukast (SINGULAIR) 10 MG tablet Take 10 mg by mouth at bedtime.     Allergies  Allergen Reactions  . Amoxicillin     Swelling and rash to face   . Sulfonamide Derivatives     Past Medical History  Diagnosis Date  . CAD (coronary artery disease)     a. LHC 10/2009:  pLAD 50%, small D1 70-80% (too small for PCI), EF 65% => med Rx  b. Myoview 10/2009: EF 65%, no ischemia;  c.  ETT-MV 3/14:  No ischemia, EF 63%  . HTN (hypertension)   . HLD (hyperlipidemia)     ROS: Negative except as per HPI  BP 130/70  Pulse 56  Ht 6' (1.829 m)  Wt 169 lb (76.658 kg)  BMI 22.92 kg/m2  PHYSICAL EXAM: Pt is alert and oriented, pleasant elderly male in NAD HEENT: normal Neck: JVP - normal, carotids 2+= without  bruits Lungs: CTA bilaterally CV: RRR without murmur or gallop Abd: soft, NT, Positive BS, no hepatomegaly Ext: no C/C/E, distal pulses intact and equal Skin: warm/dry no rash  EKG:  Sinus brady 56 bpm, nonspecific IVCD, LAD  ASSESSMENT AND PLAN: 1. CAD, nonobstructive. Last cath reviewed and no severe obstructive disease noted. Pt appears to be stable without sx's of exertional angina.   2. HTN - well controlled on lisinopril  3. Hyperlipidemia - on simvastatin. Followed by PCP. Last lipid panel from 2012:  Lipid Panel     Component Value Date/Time   CHOL 153 11/06/2010 1014   TRIG 34.0 11/06/2010 1014   HDL 82.40 11/06/2010 1014   CHOLHDL 2 11/06/2010 1014   VLDL 6.8 11/06/2010 1014   LDLCALC 64 11/06/2010 1014    I will see him back in one year unless problems arise.   Sherren Mocha 04/07/2013 10:22 PM

## 2013-04-29 ENCOUNTER — Other Ambulatory Visit: Payer: Self-pay | Admitting: Cardiovascular Disease

## 2013-07-05 ENCOUNTER — Other Ambulatory Visit: Payer: Self-pay | Admitting: Cardiovascular Disease

## 2013-07-06 ENCOUNTER — Other Ambulatory Visit: Payer: Self-pay | Admitting: Cardiology

## 2013-07-09 ENCOUNTER — Other Ambulatory Visit: Payer: Self-pay | Admitting: Cardiovascular Disease

## 2013-09-05 ENCOUNTER — Emergency Department (HOSPITAL_COMMUNITY)
Admission: EM | Admit: 2013-09-05 | Discharge: 2013-09-05 | Disposition: A | Discharge status: Home / Self Care | Payer: Medicare Other | Attending: Family Medicine | Admitting: Family Medicine

## 2013-09-05 ENCOUNTER — Encounter (HOSPITAL_COMMUNITY): Payer: Self-pay | Admitting: Emergency Medicine

## 2013-09-05 ENCOUNTER — Emergency Department (HOSPITAL_COMMUNITY): Payer: Medicare Other

## 2013-09-05 DIAGNOSIS — S0101XA Laceration without foreign body of scalp, initial encounter: Secondary | ICD-10-CM

## 2013-09-05 DIAGNOSIS — Z7982 Long term (current) use of aspirin: Secondary | ICD-10-CM | POA: Insufficient documentation

## 2013-09-05 DIAGNOSIS — W1809XA Striking against other object with subsequent fall, initial encounter: Secondary | ICD-10-CM | POA: Insufficient documentation

## 2013-09-05 DIAGNOSIS — Z880 Allergy status to penicillin: Secondary | ICD-10-CM | POA: Diagnosis not present

## 2013-09-05 DIAGNOSIS — Z79899 Other long term (current) drug therapy: Secondary | ICD-10-CM | POA: Insufficient documentation

## 2013-09-05 DIAGNOSIS — Y939 Activity, unspecified: Secondary | ICD-10-CM | POA: Diagnosis not present

## 2013-09-05 DIAGNOSIS — Z87891 Personal history of nicotine dependence: Secondary | ICD-10-CM | POA: Insufficient documentation

## 2013-09-05 DIAGNOSIS — E785 Hyperlipidemia, unspecified: Secondary | ICD-10-CM | POA: Insufficient documentation

## 2013-09-05 DIAGNOSIS — R42 Dizziness and giddiness: Secondary | ICD-10-CM | POA: Insufficient documentation

## 2013-09-05 DIAGNOSIS — Y929 Unspecified place or not applicable: Secondary | ICD-10-CM | POA: Diagnosis not present

## 2013-09-05 DIAGNOSIS — I2510 Atherosclerotic heart disease of native coronary artery without angina pectoris: Secondary | ICD-10-CM | POA: Diagnosis not present

## 2013-09-05 DIAGNOSIS — Z23 Encounter for immunization: Secondary | ICD-10-CM | POA: Diagnosis not present

## 2013-09-05 DIAGNOSIS — S0100XA Unspecified open wound of scalp, initial encounter: Secondary | ICD-10-CM | POA: Diagnosis not present

## 2013-09-05 DIAGNOSIS — S0990XA Unspecified injury of head, initial encounter: Secondary | ICD-10-CM | POA: Diagnosis present

## 2013-09-05 DIAGNOSIS — W19XXXA Unspecified fall, initial encounter: Secondary | ICD-10-CM

## 2013-09-05 DIAGNOSIS — I10 Essential (primary) hypertension: Secondary | ICD-10-CM | POA: Diagnosis not present

## 2013-09-05 MED ORDER — TETANUS-DIPHTH-ACELL PERTUSSIS 5-2.5-18.5 LF-MCG/0.5 IM SUSP
0.5000 mL | Freq: Once | INTRAMUSCULAR | Status: AC
  Administered 2013-09-05: 0.5 mL via INTRAMUSCULAR
  Filled 2013-09-05: qty 0.5

## 2013-09-05 NOTE — Discharge Instructions (Signed)
Fall Prevention and Home Safety °Falls cause injuries and can affect all age groups. It is possible to use preventive measures to significantly decrease the likelihood of falls. There are many simple measures which can make your home safer and prevent falls. °OUTDOORS °· Repair cracks and edges of walkways and driveways. °· Remove high doorway thresholds. °· Trim shrubbery on the main path into your home. °· Have good outside lighting. °· Clear walkways of tools, rocks, debris, and clutter. °· Check that handrails are not broken and are securely fastened. Both sides of steps should have handrails. °· Have leaves, snow, and ice cleared regularly. °· Use sand or salt on walkways during winter months. °· In the garage, clean up grease or oil spills. °BATHROOM °· Install night lights. °· Install grab bars by the toilet and in the tub and shower. °· Use non-skid mats or decals in the tub or shower. °· Place a plastic non-slip stool in the shower to sit on, if needed. °· Keep floors dry and clean up all water on the floor immediately. °· Remove soap buildup in the tub or shower on a regular basis. °· Secure bath mats with non-slip, double-sided rug tape. °· Remove throw rugs and tripping hazards from the floors. °BEDROOMS °· Install night lights. °· Make sure a bedside light is easy to reach. °· Do not use oversized bedding. °· Keep a telephone by your bedside. °· Have a firm chair with side arms to use for getting dressed. °· Remove throw rugs and tripping hazards from the floor. °KITCHEN °· Keep handles on pots and pans turned toward the center of the stove. Use back burners when possible. °· Clean up spills quickly and allow time for drying. °· Avoid walking on wet floors. °· Avoid hot utensils and knives. °· Position shelves so they are not too high or low. °· Place commonly used objects within easy reach. °· If necessary, use a sturdy step stool with a grab bar when reaching. °· Keep electrical cables out of the  way. °· Do not use floor polish or wax that makes floors slippery. If you must use wax, use non-skid floor wax. °· Remove throw rugs and tripping hazards from the floor. °STAIRWAYS °· Never leave objects on stairs. °· Place handrails on both sides of stairways and use them. Fix any loose handrails. Make sure handrails on both sides of the stairways are as long as the stairs. °· Check carpeting to make sure it is firmly attached along stairs. Make repairs to worn or loose carpet promptly. °· Avoid placing throw rugs at the top or bottom of stairways, or properly secure the rug with carpet tape to prevent slippage. Get rid of throw rugs, if possible. °· Have an electrician put in a light switch at the top and bottom of the stairs. °OTHER FALL PREVENTION TIPS °· Wear low-heel or rubber-soled shoes that are supportive and fit well. Wear closed toe shoes. °· When using a stepladder, make sure it is fully opened and both spreaders are firmly locked. Do not climb a closed stepladder. °· Add color or contrast paint or tape to grab bars and handrails in your home. Place contrasting color strips on first and last steps. °· Learn and use mobility aids as needed. Install an electrical emergency response system. °· Turn on lights to avoid dark areas. Replace light bulbs that burn out immediately. Get light switches that glow. °· Arrange furniture to create clear pathways. Keep furniture in the same place. °·   Firmly attach carpet with non-skid or double-sided tape. °· Eliminate uneven floor surfaces. °· Select a carpet pattern that does not visually hide the edge of steps. °· Be aware of all pets. °OTHER HOME SAFETY TIPS °· Set the water temperature for 120° F (48.8° C). °· Keep emergency numbers on or near the telephone. °· Keep smoke detectors on every level of the home and near sleeping areas. °Document Released: 12/18/2001 Document Revised: 06/29/2011 Document Reviewed: 03/19/2011 °[employer]® Patient Information ©2015  [employer]. This information is not intended to replace advice given to you by your health care provider. Make sure you discuss any questions you have with your health care provider. ° ° ° °Laceration Care, Adult °A laceration is a cut or lesion that goes through all layers of the skin and into the tissue just beneath the skin. °TREATMENT  °Some lacerations may not require closure. Some lacerations may not be able to be closed due to an increased risk of infection. It is important to see your caregiver as soon as possible after an injury to minimize the risk of infection and maximize the opportunity for successful closure. °If closure is appropriate, pain medicines may be given, if needed. The wound will be cleaned to help prevent infection. Your caregiver will use stitches (sutures), staples, wound glue (adhesive), or skin adhesive strips to repair the laceration. These tools bring the skin edges together to allow for faster healing and a better cosmetic outcome. However, all wounds will heal with a scar. Once the wound has healed, scarring can be minimized by covering the wound with sunscreen during the day for 1 full year. °HOME CARE INSTRUCTIONS  °For sutures or staples: °· Keep the wound clean and dry. °· If you were given a bandage (dressing), you should change it at least once a day. Also, change the dressing if it becomes wet or dirty, or as directed by your caregiver. °· Wash the wound with soap and water 2 times a day. Rinse the wound off with water to remove all soap. Pat the wound dry with a clean towel. °· After cleaning, apply a thin layer of the antibiotic ointment as recommended by your caregiver. This will help prevent infection and keep the dressing from sticking. °· You may shower as usual after the first 24 hours. Do not soak the wound in water until the sutures are removed. °· Only take over-the-counter or prescription medicines for pain, discomfort, or fever as directed by your  caregiver. °· Get your sutures or staples removed as directed by your caregiver. °For skin adhesive strips: °· Keep the wound clean and dry. °· Do not get the skin adhesive strips wet. You may bathe carefully, using caution to keep the wound dry. °· If the wound gets wet, pat it dry with a clean towel. °· Skin adhesive strips will fall off on their own. You may trim the strips as the wound heals. Do not remove skin adhesive strips that are still stuck to the wound. They will fall off in time. °For wound adhesive: °· You may briefly wet your wound in the shower or bath. Do not soak or scrub the wound. Do not swim. Avoid periods of heavy perspiration until the skin adhesive has fallen off on its own. After showering or bathing, gently pat the wound dry with a clean towel. °· Do not apply liquid medicine, cream medicine, or ointment medicine to your wound while the skin adhesive is in place. This may loosen the film before   your wound is healed. °· If a dressing is placed over the wound, be careful not to apply tape directly over the skin adhesive. This may cause the adhesive to be pulled off before the wound is healed. °· Avoid prolonged exposure to sunlight or tanning lamps while the skin adhesive is in place. Exposure to ultraviolet light in the first year will darken the scar. °· The skin adhesive will usually remain in place for 5 to 10 days, then naturally fall off the skin. Do not pick at the adhesive film. °You may need a tetanus shot if: °· You cannot remember when you had your last tetanus shot. °· You have never had a tetanus shot. °If you get a tetanus shot, your arm may swell, get red, and feel warm to the touch. This is common and not a problem. If you need a tetanus shot and you choose not to have one, there is a rare chance of getting tetanus. Sickness from tetanus can be serious. °SEEK MEDICAL CARE IF:  °· You have redness, swelling, or increasing pain in the wound. °· You see a red line that goes away  from the wound. °· You have yellowish-white fluid (pus) coming from the wound. °· You have a fever. °· You notice a bad smell coming from the wound or dressing. °· Your wound breaks open before or after sutures have been removed. °· You notice something coming out of the wound such as wood or glass. °· Your wound is on your hand or foot and you cannot move a finger or toe. °SEEK IMMEDIATE MEDICAL CARE IF:  °· Your pain is not controlled with prescribed medicine. °· You have severe swelling around the wound causing pain and numbness or a change in color in your arm, hand, leg, or foot. °· Your wound splits open and starts bleeding. °· You have worsening numbness, weakness, or loss of function of any joint around or beyond the wound. °· You develop painful lumps near the wound or on the skin anywhere on your body. °MAKE SURE YOU:  °· Understand these instructions. °· Will watch your condition. °· Will get help right away if you are not doing well or get worse. °Document Released: 12/28/2004 Document Revised: 03/22/2011 Document Reviewed: 06/23/2010 °ExitCare® Patient Information ©2015 [employer]. This information is not intended to replace advice given to you by your health care provider. Make sure you discuss any questions you have with your health care provider. ° °

## 2013-09-05 NOTE — ED Notes (Signed)
Pt was on a ladder this am and fell off and hit head on cement floor. Denies blood thinner. Denies LOC. sts slight HA and bleeding controlled.

## 2013-09-05 NOTE — ED Notes (Signed)
Pt fell 4-5 feet off a ladder onto concrete, landing on his head. States was "stunned". Denies N/V. Has 1 in laceration to posterior scalp. Bleeding controlled. Pt still has c-collar in place.

## 2013-09-05 NOTE — ED Provider Notes (Signed)
CSN: [id number]     Arrival date & time 09/05/13  1246 History   First MD Initiated Contact with Patient 09/05/13 1451     Chief Complaint  Patient presents with  . Fall     (Consider location/radiation/quality/duration/timing/severity/associated sxs/prior Treatment) HPI Comments: 80 year old male presents for evaluation of fall and head injury. He fell off a 2 foot step ladder and hit the back of his head onto the concrete floor. He thinks he may have lost consciousness for a second because he says he felt very confused when he first tried to get up.. Since then, he has been lucid. No further loss of consciousness, headache, nausea, vomiting, or any other symptoms apart from the laceration on the back of his head. He does not take any blood thinners. No previous history of cerebral hemorrhages.  Patient is a 80 y.o. male presenting with fall.  Fall Pertinent negatives include no chest pain, chills, coughing, fever, headaches, nausea, numbness, vomiting or weakness.    Past Medical History  Diagnosis Date  . CAD (coronary artery disease)     a. LHC 10/2009:  pLAD 50%, small D1 70-80% (too small for PCI), EF 65% => med Rx  b. Myoview 10/2009: EF 65%, no ischemia;  c.  ETT-MV 3/14:  No ischemia, EF 63%  . HTN (hypertension)   . HLD (hyperlipidemia)    Past Surgical History  Procedure Laterality Date  . Knee arthroscopy     Family History  Problem Relation Age of Onset  . Sudden death Mother     sudden cardiac arrest  . COPD Father   . Heart failure Father     CHF   History  Substance Use Topics  . Smoking status: Former Smoker    Quit date: 01/12/1971  . Smokeless tobacco: Never Used  . Alcohol Use: No    Review of Systems  Constitutional: Negative for fever and chills.  Respiratory: Negative for cough and shortness of breath.   Cardiovascular: Negative for chest pain.  Gastrointestinal: Negative for nausea, vomiting and diarrhea.  Skin: Positive for wound.    Neurological: Positive for dizziness. Negative for facial asymmetry, weakness, numbness and headaches.  Hematological: Does not bruise/bleed easily.  All other systems reviewed and are negative.     Allergies  Amoxicillin and Sulfonamide derivatives  Home Medications   Prior to Admission medications   Medication Sig Start Date End Date Taking? Authorizing Provider  aspirin 81 MG tablet Take 81 mg by mouth daily.      Historical Provider, MD  latanoprost (XALATAN) 0.005 % ophthalmic solution 1 drop at bedtime.      Historical Provider, MD  lisinopril (PRINIVIL,ZESTRIL) 10 MG tablet TAKE 1 TABLET EVERY DAY    Sherren Mocha, MD  NITROSTAT 0.4 MG SL tablet 1 TABLET UNDER TONGUE AT ONSET OF CHEST PAIN YOU MAY REPEAT EVERY 5 MINUTES FOR UP TO 3 DOSES. 09/12/11   Renella Cunas, MD  oxybutynin (DITROPAN-XL) 5 MG 24 hr tablet Take 5 mg by mouth at bedtime.  03/19/13   Historical Provider, MD  simvastatin (ZOCOR) 20 MG tablet TAKE 1 TABLET BY MOUTH DAILY    Sherren Mocha, MD  simvastatin (ZOCOR) 20 MG tablet TAKE 1 TABLET BY MOUTH DAILY 07/06/13   Sherren Mocha, MD   BP 143/67  Pulse 67  Temp(Src) 98.3 F (36.8 C) (Oral)  Resp 18  SpO2 97% Physical Exam  Nursing note and vitals reviewed. Constitutional: He is oriented to person, place, and time. He  appears well-developed and well-nourished. No distress.  HENT:  Head: Normocephalic. Head is with laceration. Head is without raccoon's eyes, without Battle's sign, without right periorbital erythema and without left periorbital erythema.    Right Ear: No hemotympanum.  Left Ear: No hemotympanum.  Pulmonary/Chest: Effort normal. No respiratory distress.  Neurological: He is alert and oriented to person, place, and time. He has normal strength and normal reflexes. He is not disoriented. No cranial nerve deficit or sensory deficit. He exhibits normal muscle tone. He displays a negative Romberg sign. Coordination and gait normal. GCS eye subscore  is 4. GCS verbal subscore is 5. GCS motor subscore is 6.  Skin: Skin is warm and dry. No rash noted. He is not diaphoretic.  Psychiatric: He has a normal mood and affect. Judgment normal.    ED Course  LACERATION REPAIR Date/Time: 09/05/2013 3:53 PM Performed by: Allena Katz, H Authorized by: Allena Katz, H Consent: Verbal consent obtained. Risks and benefits: risks, benefits and alternatives were discussed Consent given by: patient Patient identity confirmed: verbally with patient Time out: Immediately prior to procedure a "time out" was called to verify the correct patient, procedure, equipment, support staff and site/side marked as required. Body area: head/neck Location details: scalp Laceration length: 6 cm Foreign bodies: no foreign bodies Tendon involvement: none Nerve involvement: none Vascular damage: no Anesthesia: local infiltration Local anesthetic: lidocaine 2% with epinephrine Anesthetic total: 6 ml Patient sedated: no Preparation: Patient was prepped and draped in the usual sterile fashion. Irrigation solution: saline Irrigation method: syringe Amount of cleaning: standard Debridement: none Degree of undermining: none Skin closure: staples Number of sutures: 7 Technique: simple Approximation: close Approximation difficulty: simple Dressing: 4x4 sterile gauze and antibiotic ointment Patient tolerance: Patient tolerated the procedure well with no immediate complications.   (including critical care time) Labs Review Labs Reviewed - No data to display  Imaging Review Ct Head Wo Contrast  09/05/2013   CLINICAL DATA:  [place] from ladder, history hypertension, coronary artery disease, former smoker  EXAM: CT HEAD WITHOUT CONTRAST  CT CERVICAL SPINE WITHOUT CONTRAST  TECHNIQUE: Multidetector CT imaging of the head and cervical spine was performed following the standard protocol without intravenous contrast. Multiplanar CT image reconstructions of the cervical  spine were also generated.  COMPARISON:  None  FINDINGS: CT HEAD FINDINGS  Generalized atrophy.  Normal ventricular morphology.  No midline shift or mass effect.  Small vessel chronic ischemic changes of deep cerebral white matter.  No intracranial hemorrhage, mass lesion, or acute infarction.  Visualized paranasal sinuses and mastoid air cells clear.  Bones unremarkable.  Small posterior high LEFT parietal scalp hematoma.  CT CERVICAL SPINE FINDINGS  Visualized skullbase intact.  Probable cerumen within external auditory canals bilaterally.  Prevertebral soft tissues normal thickness.  Multilevel disc space narrowing and endplate spur formation.  Endplate sclerosis and minimal retrolisthesis at C5-C6.  Multilevel facet degenerative changes.  Vertebral body heights maintained without fracture or subluxation.  Lung apices clear.  Atherosclerotic calcifications of the carotid vessels bilaterally, RIGHT carotid extending retropharyngeal.  IMPRESSION: Atrophy with small vessel chronic ischemic changes in deep cerebral white matter.  No acute intracranial abnormalities.  Multilevel degenerative disc and facet disease changes of the cervical spine.  No acute cervical spine abnormalities.   Electronically Signed   By: Lavonia Dana M.D.   On: 09/05/2013 13:30   Ct Cervical Spine Wo Contrast  09/05/2013   CLINICAL DATA:  [place] from ladder, history hypertension, coronary artery disease, former smoker  EXAM: CT HEAD WITHOUT CONTRAST  CT CERVICAL SPINE WITHOUT CONTRAST  TECHNIQUE: Multidetector CT imaging of the head and cervical spine was performed following the standard protocol without intravenous contrast. Multiplanar CT image reconstructions of the cervical spine were also generated.  COMPARISON:  None  FINDINGS: CT HEAD FINDINGS  Generalized atrophy.  Normal ventricular morphology.  No midline shift or mass effect.  Small vessel chronic ischemic changes of deep cerebral white matter.  No intracranial hemorrhage, mass  lesion, or acute infarction.  Visualized paranasal sinuses and mastoid air cells clear.  Bones unremarkable.  Small posterior high LEFT parietal scalp hematoma.  CT CERVICAL SPINE FINDINGS  Visualized skullbase intact.  Probable cerumen within external auditory canals bilaterally.  Prevertebral soft tissues normal thickness.  Multilevel disc space narrowing and endplate spur formation.  Endplate sclerosis and minimal retrolisthesis at C5-C6.  Multilevel facet degenerative changes.  Vertebral body heights maintained without fracture or subluxation.  Lung apices clear.  Atherosclerotic calcifications of the carotid vessels bilaterally, RIGHT carotid extending retropharyngeal.  IMPRESSION: Atrophy with small vessel chronic ischemic changes in deep cerebral white matter.  No acute intracranial abnormalities.  Multilevel degenerative disc and facet disease changes of the cervical spine.  No acute cervical spine abnormalities.   Electronically Signed   By: Lavonia Dana M.D.   On: 09/05/2013 13:30     EKG Interpretation None      MDM   Final diagnoses:  Fall, initial encounter  Laceration of scalp, initial encounter    Laceration, repaired with 7 staples without difficulty. Bleeding controlled.  He has a normal CT scan with normal exam and no signs of increased ICP, is stable for discharge. Reviewed return precautions with the patient, his son, and his wife. They will return as needed for any worsening. Tetanus updated today.      Liam Graham, PA-C 09/05/13 1556

## 2013-09-06 NOTE — ED Provider Notes (Signed)
Medical screening examination/treatment/procedure(s) were performed by a resident physician or non-physician practitioner and as the supervising physician I was immediately available for consultation/collaboration.  Linna Darner, MD Family Medicine   Waldemar Dickens, MD 09/06/13 [phone]

## 2013-12-31 ENCOUNTER — Other Ambulatory Visit: Payer: Self-pay | Admitting: Cardiovascular Disease

## 2014-01-24 ENCOUNTER — Other Ambulatory Visit: Payer: Self-pay | Admitting: Urology

## 2014-01-30 ENCOUNTER — Other Ambulatory Visit: Payer: Self-pay | Admitting: Family Medicine

## 2014-01-30 DIAGNOSIS — R4182 Altered mental status, unspecified: Secondary | ICD-10-CM

## 2014-02-04 ENCOUNTER — Ambulatory Visit
Admission: RE | Admit: 2014-02-04 | Discharge: 2014-02-04 | Disposition: A | Discharge status: Home / Self Care | Payer: Medicare Other | Source: Ambulatory Visit | Attending: Family Medicine | Admitting: Family Medicine

## 2014-02-04 DIAGNOSIS — R4182 Altered mental status, unspecified: Secondary | ICD-10-CM

## 2014-02-08 ENCOUNTER — Telehealth: Payer: Self-pay | Admitting: Cardiovascular Disease

## 2014-02-08 DIAGNOSIS — G459 Transient cerebral ischemic attack, unspecified: Secondary | ICD-10-CM

## 2014-02-08 NOTE — Telephone Encounter (Signed)
Called patient's daughter back about the patient's lapse of memory last Sunday. Patient followed up with his PCP. PCP thinks patient might have had a TIA, patient had no deficits. Daughter is concerned about patient having a TURP done on 22nd of February. Patient's daughter wants to see if he can follow up with Dr. Burt Knack sooner and before his TURP. Will forward to Dr. Burt Knack for further instructions.

## 2014-02-08 NOTE — Telephone Encounter (Signed)
New message      Pt had a memory lapse on last Sunday.  Pt has a blockage in the heart---daughter want to know if he could have had a blood clot that broke away and caused this memory lapse.  Should he see Dr Burt Knack?

## 2014-02-12 NOTE — Telephone Encounter (Signed)
Called [name] and left voicemail. She will call back.

## 2014-02-12 NOTE — Telephone Encounter (Signed)
Dr Burt Knack spoke with Jesus Harmon and he would like the pt to have an Echo and office visit next week. Appointments scheduled on 02/19/14.

## 2014-02-12 NOTE — Telephone Encounter (Signed)
I spoke with the pt's daughter and made her aware of appointments.  She is scheduled to work on 02/19/14 and would like [place] to call her at [phone] to give report on the pt after his visit.

## 2014-02-19 ENCOUNTER — Ambulatory Visit (HOSPITAL_COMMUNITY): Payer: Medicare Other | Attending: Family Medicine | Admitting: Radiology

## 2014-02-19 ENCOUNTER — Encounter: Payer: Self-pay | Admitting: Cardiovascular Disease

## 2014-02-19 ENCOUNTER — Other Ambulatory Visit (HOSPITAL_COMMUNITY): Payer: Medicare Other

## 2014-02-19 ENCOUNTER — Ambulatory Visit (INDEPENDENT_AMBULATORY_CARE_PROVIDER_SITE_OTHER): Payer: Medicare Other | Admitting: Cardiovascular Disease

## 2014-02-19 ENCOUNTER — Ambulatory Visit (INDEPENDENT_AMBULATORY_CARE_PROVIDER_SITE_OTHER)
Admission: RE | Admit: 2014-02-19 | Discharge: 2014-02-19 | Disposition: A | Discharge status: Home / Self Care | Payer: Medicare Other | Source: Ambulatory Visit | Attending: Cardiovascular Disease | Admitting: Cardiovascular Disease

## 2014-02-19 VITALS — BP 138/72 | HR 52 | Ht 72.0 in | Wt 167.1 lb

## 2014-02-19 DIAGNOSIS — G459 Transient cerebral ischemic attack, unspecified: Secondary | ICD-10-CM

## 2014-02-19 DIAGNOSIS — E785 Hyperlipidemia, unspecified: Secondary | ICD-10-CM

## 2014-02-19 DIAGNOSIS — I10 Essential (primary) hypertension: Secondary | ICD-10-CM

## 2014-02-19 NOTE — Progress Notes (Signed)
Echocardiogram performed.  

## 2014-02-19 NOTE — Progress Notes (Signed)
Cardiology Office Note   Date:  02/20/2014   ID:  BARTOW ZYLSTRA, DOB Feb 17, 1929, MRN [id number]  PCP:  Donnie Coffin, MD  Cardiologist:  Sherren Mocha, MD    No chief complaint on file.    History of Present Illness: Jesus Harmon is a 81 y.o. male who presents for follow-up of CAD. There was concern about a recent TIA and I was asked to see him to review whether any further cardiac testing is necessary. He is scheduled for an elective TURP and also wanted to make sure he can proceed with surgery without further testing.  The patient was getting dressed for church 2 weeks ago and experienced clumsiness when he was trying to tie his bow tie. He had not experienced this before. He had no chest pain, palpitations, visual field changes, or headache. He had no clear localizing weakness. He then drove to [place] and became confused about the directions. He's had no recurrence of symptoms and nothing prior to that episode other than occasional episodes of forgetfulness.   The patient otherwise has been physically active. He c/o generalized fatigue but denies exercise intolerance. He has been seen by his PCP and a carotid duplex was ordered demonstrating no significant stenosis.   Past Medical History  Diagnosis Date  . CAD (coronary artery disease)     a. LHC 10/2009:  pLAD 50%, small D1 70-80% (too small for PCI), EF 65% => med Rx  b. Myoview 10/2009: EF 65%, no ischemia;  c.  ETT-MV 3/14:  No ischemia, EF 63%  . HTN (hypertension)   . HLD (hyperlipidemia)     Past Surgical History  Procedure Laterality Date  . Knee arthroscopy      Current Outpatient Prescriptions  Medication Sig Dispense Refill  . aspirin 81 MG tablet Take 81 mg by mouth daily.      . cetirizine (ZYRTEC) 10 MG tablet Take 10 mg by mouth daily.  6  . latanoprost (XALATAN) 0.005 % ophthalmic solution Place 1 drop into both eyes at bedtime.     Marland Kitchen lisinopril (PRINIVIL,ZESTRIL) 10 MG tablet TAKE 1 TABLET EVERY DAY  30 tablet 5  . montelukast (SINGULAIR) 10 MG tablet Take 10 mg by mouth every evening.   6  . NITROSTAT 0.4 MG SL tablet 1 TABLET UNDER TONGUE AT ONSET OF CHEST PAIN YOU MAY REPEAT EVERY 5 MINUTES FOR UP TO 3 DOSES. 25 tablet 1  . simvastatin (ZOCOR) 20 MG tablet TAKE 1 TABLET BY MOUTH DAILY 30 tablet 5  . tamsulosin (FLOMAX) 0.4 MG CAPS capsule Take 0.4 mg by mouth at bedtime.   11   No current facility-administered medications for this visit.    Allergies:   Amoxicillin and Sulfonamide derivatives   Social History:  The patient  reports that he quit smoking about 43 years ago. He has never used smokeless tobacco. He reports that he does not drink alcohol or use illicit drugs.   Family History:  The patient's family history includes COPD in his father; Heart failure in his father; Sudden death in his mother.    ROS:  Please see the history of present illness.  Otherwise, review of systems is positive for fatigue.  All other systems are reviewed and negative.    PHYSICAL EXAM: VS:  BP 138/72 mmHg  Pulse 52  Ht 6' (1.829 m)  Wt 167 lb 1.9 oz (75.805 kg)  BMI 22.66 kg/m2 , BMI Body mass index is 22.66 kg/(m^2). GEN: Well nourished, well  developed, in no acute distress HEENT: normal Neck: no JVD, carotid bruits, or masses Cardiac: RRR without murmur or gallop                No peripheral edema Respiratory:  clear to auscultation bilaterally, normal work of breathing GI: soft, nontender, nondistended, + BS MS: no deformity or atrophy Skin: warm and dry, no rash Neuro:  Strength and sensation are intact Psych: euthymic mood, full affect  EKG:  EKG is ordered today. The ekg ordered today shows sinus bradycardia 52 bpm, otherwise within normal limits.  Recent Labs: No results found for requested labs within last 365 days.   Lipid Panel     Component Value Date/Time   CHOL 153 11/06/2010 1014   TRIG 34.0 11/06/2010 1014   HDL 82.40 11/06/2010 1014   CHOLHDL 2 11/06/2010 1014     VLDL 6.8 11/06/2010 1014   LDLCALC 64 11/06/2010 1014      Wt Readings from Last 3 Encounters:  02/19/14 167 lb 1.9 oz (75.805 kg)  04/03/13 169 lb (76.658 kg)  03/21/12 175 lb (79.379 kg)     Cardiac Studies Reviewed: 2D Echo 2.9.16 Study Conclusions  - Left ventricle: The cavity size was normal. Systolic function was normal. The estimated ejection fraction was in the range of 55% to 60%. Wall motion was normal; there were no regional wall motion abnormalities. - Aortic valve: Mild focal calcification involving the noncoronary cusp. - Mitral valve: Redundant chordae tendinae off the anterior mitral valve leaflet. There was mild regurgitation. - Atrial septum: There was increased thickness of the septum, consistent with lipomatous hypertrophy. - Tricuspid valve: There was trivial regurgitation. - Pulmonic valve: There was mild regurgitation.  Carotid duplex 1.25.16 RIGHT CAROTID ARTERY: Moderate calcified plaque in the bulb. Low resistance internal carotid Doppler pattern.  RIGHT VERTEBRAL ARTERY: Antegrade with a normal Doppler pattern.  LEFT CAROTID ARTERY: There is focal calcified plaque in the bulb. Low resistance internal carotid Doppler pattern.  LEFT VERTEBRAL ARTERY: Antegrade with a normal Doppler pattern.  IMPRESSION: Less than 50% stenosis in the right and left internal carotid Arteries.  ASSESSMENT AND PLAN: 1.  Transient clumsiness and confusion possible TIA. Carotids ok. Echo ordered and shows no significant abnormality. Meds reviewed and are appropriate - ASA, statin. Will check head CT to be certain there is no radiographic evidence of stroke. Otherwise continue current Rx.  2. CAD, native vessel, without angina. Continue med Rx.  3. HTN - BP well-controlled on lisinopril.  4. Hyperlipidemia - on simvastatin. Followed by Dr Alroy Dust.  5. Preop evaluation - cardiac status is ok with no angina. As long as CT brain ok I think he  can proceed with TURP without significant cardiovascular risk.   Current medicines are reviewed with the patient today.  The patient does not have concerns regarding medicines.  The following changes have been made:  no change  Labs/ tests ordered today include:   Orders Placed This Encounter  Procedures  . CT Head Wo Contrast  . EKG 12-Lead    Disposition:   FU in one year  Signed, Sherren Mocha, MD  02/20/2014 10:32 PM    [facility] [address], [place], [place]  [address] Phone: [phone]; Fax: [phone]

## 2014-02-19 NOTE — Patient Instructions (Addendum)
Non-Cardiac CT scanning, (CAT scanning), is a noninvasive, special x-ray that produces cross-sectional images of the body using x-rays and a computer. CT scans help physicians diagnose and treat medical conditions. For some CT exams, a contrast material is used to enhance visibility in the area of the body being studied. CT scans provide greater clarity and reveal more details than regular x-ray exams. (CT of BRAIN without contrast)  Your physician recommends that you continue on your current medications as directed. Please refer to the Current Medication list given to you today.  Your physician wants you to follow-up in: 1 YEAR with Dr Burt Knack.  You will receive a reminder letter in the mail two months in advance. If you don't receive a letter, please call our office to schedule the follow-up appointment.

## 2014-02-26 NOTE — Patient Instructions (Addendum)
Jesus Harmon  02/26/2014   Your procedure is scheduled on: 03/04/2014    Report to [facility] Main  Entrance and follow signs to               [place] at      [place] AM.  Call this number if you have problems the morning of surgery [phone]   Remember:  Do not eat food or drink liquids :After Midnight.     Take these medicines the morning of surgery with A SIP OF WATER: Zyrtec                                You may not have any metal on your body including hair pins and              piercings  Do not wear jewelry, , lotions, powders or perfumes., deodorant.                          Men may shave face and neck.   Do not bring valuables to the hospital. [place].  Contacts, dentures or bridgework may not be worn into surgery.  Leave suitcase in the car. After surgery it may be brought to your room.         Special Instructions: coughing and deep breathing exercises, leg exercises               Please read over the following fact sheets you were given: _____________________________________________________________________             [facility] - Preparing for Surgery Before surgery, you can play an important role.  Because skin is not sterile, your skin needs to be as free of germs as possible.  You can reduce the number of germs on your skin by washing with CHG (chlorahexidine gluconate) soap before surgery.  CHG is an antiseptic cleaner which kills germs and bonds with the skin to continue killing germs even after washing. Please DO NOT use if you have an allergy to CHG or antibacterial soaps.  If your skin becomes reddened/irritated stop using the CHG and inform your nurse when you arrive at Short Stay. Do not shave (including legs and underarms) for at least 48 hours prior to the first CHG shower.  You may shave your face/neck. Please follow these instructions carefully:  1.  Shower with  CHG Soap the night before surgery and the  morning of Surgery.  2.  If you choose to wash your hair, wash your hair first as usual with your  normal  shampoo.  3.  After you shampoo, rinse your hair and body thoroughly to remove the  shampoo.                           4.  Use CHG as you would any other liquid soap.  You can apply chg directly  to the skin and wash                       Gently with a scrungie or clean washcloth.  5.  Apply the CHG Soap to your body ONLY FROM  THE NECK DOWN.   Do not use on face/ open                           Wound or open sores. Avoid contact with eyes, ears mouth and genitals (private parts).                       Wash face,  Genitals (private parts) with your normal soap.             6.  Wash thoroughly, paying special attention to the area where your surgery  will be performed.  7.  Thoroughly rinse your body with warm water from the neck down.  8.  DO NOT shower/wash with your normal soap after using and rinsing off  the CHG Soap.                9.  Pat yourself dry with a clean towel.            10.  Wear clean pajamas.            11.  Place clean sheets on your bed the night of your first shower and do not  sleep with pets. Day of Surgery : Do not apply any lotions/deodorants the morning of surgery.  Please wear clean clothes to the hospital/surgery center.  FAILURE TO FOLLOW THESE INSTRUCTIONS MAY RESULT IN THE CANCELLATION OF YOUR SURGERY PATIENT SIGNATURE_________________________________  NURSE SIGNATURE__________________________________  ________________________________________________________________________

## 2014-02-27 ENCOUNTER — Encounter (HOSPITAL_COMMUNITY)
Admission: RE | Admit: 2014-02-27 | Discharge: 2014-02-27 | Disposition: A | Discharge status: Home / Self Care | Payer: Medicare Other | Source: Ambulatory Visit | Attending: Urology | Admitting: Urology

## 2014-02-27 ENCOUNTER — Encounter (HOSPITAL_COMMUNITY): Payer: Self-pay

## 2014-02-27 DIAGNOSIS — D291 Benign neoplasm of prostate: Secondary | ICD-10-CM | POA: Diagnosis not present

## 2014-02-27 DIAGNOSIS — Z01818 Encounter for other preprocedural examination: Secondary | ICD-10-CM | POA: Diagnosis not present

## 2014-02-27 HISTORY — DX: Malignant (primary) neoplasm, unspecified: C80.1

## 2014-02-27 HISTORY — DX: Unspecified osteoarthritis, unspecified site: M19.90

## 2014-02-27 LAB — CBC
HCT: 39.2 % (ref 39.0–52.0)
Hemoglobin: 12.1 g/dL — ABNORMAL LOW (ref 13.0–17.0)
MCH: 29.2 pg (ref 26.0–34.0)
MCHC: 30.9 g/dL (ref 30.0–36.0)
MCV: 94.5 fL (ref 78.0–100.0)
PLATELETS: 262 10*3/uL (ref 150–400)
RBC: 4.15 MIL/uL — AB (ref 4.22–5.81)
RDW: 13.1 % (ref 11.5–15.5)
WBC: 5.2 10*3/uL (ref 4.0–10.5)

## 2014-02-27 LAB — BASIC METABOLIC PANEL
Anion gap: 7 (ref 5–15)
BUN: 20 mg/dL (ref 6–23)
CHLORIDE: 105 mmol/L (ref 96–112)
CO2: 30 mmol/L (ref 19–32)
Calcium: 9.5 mg/dL (ref 8.4–10.5)
Creatinine, Ser: 1 mg/dL (ref 0.50–1.35)
GFR calc Af Amer: 79 mL/min — ABNORMAL LOW (ref >90)
GFR calc non Af Amer: 68 mL/min — ABNORMAL LOW (ref >90)
GLUCOSE: 115 mg/dL — AB (ref 70–99)
POTASSIUM: 4.7 mmol/L (ref 3.5–5.1)
Sodium: 142 mmol/L (ref 135–145)

## 2014-02-27 NOTE — Progress Notes (Signed)
EKG- 02/19/14 EPIC  ECHO- 02/19/14 EPIC  LOV with Dr Burt Knack- 02/19/14 EPIC- preop clearance  Carotid dopplers- in EPIC   Stress TEst 03/2012 EPIC  CT head 02/19/14 EPIC

## 2014-02-27 NOTE — Progress Notes (Signed)
Dr Lissa Hoard made aware of recent history and saw office visit note of Dr Burt Knack on 02/19/14 along with CT head results, echo and carotid dopper results.  No new orders given.

## 2014-03-02 ENCOUNTER — Other Ambulatory Visit: Payer: Self-pay | Admitting: Cardiovascular Disease

## 2014-03-04 ENCOUNTER — Ambulatory Visit (HOSPITAL_COMMUNITY): Payer: Medicare Other | Admitting: Anesthesiology

## 2014-03-04 ENCOUNTER — Ambulatory Visit (HOSPITAL_COMMUNITY)
Admission: RE | Admit: 2014-03-04 | Discharge: 2014-03-05 | Disposition: A | Discharge status: Home / Self Care | Payer: Medicare Other | Source: Ambulatory Visit | Attending: Urology | Admitting: Urology

## 2014-03-04 ENCOUNTER — Encounter (HOSPITAL_COMMUNITY)
Admission: RE | Disposition: A | Discharge status: Home / Self Care | Payer: Self-pay | Source: Ambulatory Visit | Attending: Urology

## 2014-03-04 ENCOUNTER — Encounter (HOSPITAL_COMMUNITY): Payer: Self-pay | Admitting: *Deleted

## 2014-03-04 DIAGNOSIS — N400 Enlarged prostate without lower urinary tract symptoms: Secondary | ICD-10-CM | POA: Diagnosis not present

## 2014-03-04 DIAGNOSIS — N521 Erectile dysfunction due to diseases classified elsewhere: Secondary | ICD-10-CM | POA: Insufficient documentation

## 2014-03-04 DIAGNOSIS — Z7982 Long term (current) use of aspirin: Secondary | ICD-10-CM | POA: Diagnosis not present

## 2014-03-04 DIAGNOSIS — Z79899 Other long term (current) drug therapy: Secondary | ICD-10-CM | POA: Insufficient documentation

## 2014-03-04 DIAGNOSIS — Z87891 Personal history of nicotine dependence: Secondary | ICD-10-CM | POA: Insufficient documentation

## 2014-03-04 DIAGNOSIS — I771 Stricture of artery: Secondary | ICD-10-CM | POA: Insufficient documentation

## 2014-03-04 DIAGNOSIS — Z86718 Personal history of other venous thrombosis and embolism: Secondary | ICD-10-CM | POA: Insufficient documentation

## 2014-03-04 DIAGNOSIS — I2510 Atherosclerotic heart disease of native coronary artery without angina pectoris: Secondary | ICD-10-CM | POA: Insufficient documentation

## 2014-03-04 DIAGNOSIS — N3030 Trigonitis without hematuria: Secondary | ICD-10-CM | POA: Insufficient documentation

## 2014-03-04 DIAGNOSIS — I10 Essential (primary) hypertension: Secondary | ICD-10-CM | POA: Diagnosis not present

## 2014-03-04 DIAGNOSIS — C679 Malignant neoplasm of bladder, unspecified: Secondary | ICD-10-CM | POA: Diagnosis not present

## 2014-03-04 HISTORY — PX: TRANSURETHRAL RESECTION OF PROSTATE: SHX73

## 2014-03-04 SURGERY — TRANSURETHRAL RESECTION OF THE PROSTATE WITH GYRUS INSTRUMENTS
Anesthesia: General

## 2014-03-04 MED ORDER — EPHEDRINE SULFATE 50 MG/ML IJ SOLN
INTRAMUSCULAR | Status: DC | PRN
  Administered 2014-03-04: 10 mg via INTRAVENOUS
  Administered 2014-03-04: 5 mg via INTRAVENOUS

## 2014-03-04 MED ORDER — FENTANYL CITRATE 0.05 MG/ML IJ SOLN
INTRAMUSCULAR | Status: AC
  Filled 2014-03-04: qty 2

## 2014-03-04 MED ORDER — SENNOSIDES-DOCUSATE SODIUM 8.6-50 MG PO TABS
2.0000 | ORAL_TABLET | Freq: Every day | ORAL | Status: DC
  Administered 2014-03-04: 2 via ORAL
  Filled 2014-03-04: qty 2

## 2014-03-04 MED ORDER — MEPERIDINE HCL 50 MG/ML IJ SOLN: 6.2500 mg | INTRAMUSCULAR | Status: DC | PRN

## 2014-03-04 MED ORDER — LIDOCAINE HCL (CARDIAC) 20 MG/ML IV SOLN
INTRAVENOUS | Status: AC
  Filled 2014-03-04: qty 5

## 2014-03-04 MED ORDER — PHENYLEPHRINE HCL 10 MG/ML IJ SOLN
INTRAMUSCULAR | Status: DC | PRN
  Administered 2014-03-04: 80 ug via INTRAVENOUS

## 2014-03-04 MED ORDER — CIPROFLOXACIN IN D5W 400 MG/200ML IV SOLN
INTRAVENOUS | Status: AC
  Filled 2014-03-04: qty 200

## 2014-03-04 MED ORDER — ACETAMINOPHEN 325 MG PO TABS: 650.0000 mg | ORAL_TABLET | ORAL | Status: DC | PRN

## 2014-03-04 MED ORDER — NITROGLYCERIN 0.4 MG SL SUBL: 0.4000 mg | SUBLINGUAL_TABLET | SUBLINGUAL | Status: DC | PRN

## 2014-03-04 MED ORDER — LIDOCAINE HCL (CARDIAC) 20 MG/ML IV SOLN
INTRAVENOUS | Status: DC | PRN
  Administered 2014-03-04: 40 mg via INTRAVENOUS

## 2014-03-04 MED ORDER — FENTANYL CITRATE 0.05 MG/ML IJ SOLN
INTRAMUSCULAR | Status: DC | PRN
  Administered 2014-03-04 (×4): 25 ug via INTRAVENOUS
  Administered 2014-03-04: 100 ug via INTRAVENOUS

## 2014-03-04 MED ORDER — ONDANSETRON HCL 4 MG/2ML IJ SOLN
INTRAMUSCULAR | Status: DC | PRN
  Administered 2014-03-04: 4 mg via INTRAVENOUS

## 2014-03-04 MED ORDER — ZOLPIDEM TARTRATE 5 MG PO TABS: 5.0000 mg | ORAL_TABLET | Freq: Every evening | ORAL | Status: DC | PRN

## 2014-03-04 MED ORDER — PROMETHAZINE HCL 25 MG/ML IJ SOLN: 6.2500 mg | INTRAMUSCULAR | Status: DC | PRN

## 2014-03-04 MED ORDER — SODIUM CHLORIDE 0.9 % IJ SOLN
INTRAMUSCULAR | Status: AC
  Filled 2014-03-04: qty 10

## 2014-03-04 MED ORDER — PROPOFOL 10 MG/ML IV BOLUS
INTRAVENOUS | Status: AC
  Filled 2014-03-04: qty 20

## 2014-03-04 MED ORDER — LORATADINE 10 MG PO TABS
10.0000 mg | ORAL_TABLET | Freq: Every day | ORAL | Status: DC
  Administered 2014-03-05: 10 mg via ORAL
  Filled 2014-03-04: qty 1

## 2014-03-04 MED ORDER — SIMVASTATIN 20 MG PO TABS
20.0000 mg | ORAL_TABLET | Freq: Every day | ORAL | Status: DC
  Administered 2014-03-04: 20 mg via ORAL
  Filled 2014-03-04 (×2): qty 1

## 2014-03-04 MED ORDER — ONDANSETRON HCL 4 MG/2ML IJ SOLN
4.0000 mg | INTRAMUSCULAR | Status: DC | PRN
  Administered 2014-03-04: 4 mg via INTRAVENOUS
  Filled 2014-03-04: qty 2

## 2014-03-04 MED ORDER — PROPOFOL 10 MG/ML IV BOLUS
INTRAVENOUS | Status: DC | PRN
  Administered 2014-03-04: 30 mg via INTRAVENOUS
  Administered 2014-03-04: 140 mg via INTRAVENOUS

## 2014-03-04 MED ORDER — PHENYLEPHRINE 40 MCG/ML (10ML) SYRINGE FOR IV PUSH (FOR BLOOD PRESSURE SUPPORT)
PREFILLED_SYRINGE | INTRAVENOUS | Status: AC
  Filled 2014-03-04: qty 10

## 2014-03-04 MED ORDER — MONTELUKAST SODIUM 10 MG PO TABS
10.0000 mg | ORAL_TABLET | Freq: Every evening | ORAL | Status: DC
  Administered 2014-03-04: 10 mg via ORAL
  Filled 2014-03-04 (×2): qty 1

## 2014-03-04 MED ORDER — OXYCODONE-ACETAMINOPHEN 5-325 MG PO TABS
1.0000 | ORAL_TABLET | ORAL | Status: DC | PRN
  Administered 2014-03-05: 2 via ORAL
  Filled 2014-03-04 (×2): qty 2

## 2014-03-04 MED ORDER — FENTANYL CITRATE 0.05 MG/ML IJ SOLN
25.0000 ug | INTRAMUSCULAR | Status: DC | PRN
  Administered 2014-03-04: 25 ug via INTRAVENOUS
  Administered 2014-03-04: 50 ug via INTRAVENOUS
  Administered 2014-03-04: 25 ug via INTRAVENOUS

## 2014-03-04 MED ORDER — LACTATED RINGERS IV SOLN
INTRAVENOUS | Status: DC | PRN
  Administered 2014-03-04 (×2): via INTRAVENOUS

## 2014-03-04 MED ORDER — DIPHENHYDRAMINE HCL 12.5 MG/5ML PO ELIX: 12.5000 mg | ORAL_SOLUTION | Freq: Four times a day (QID) | ORAL | Status: DC | PRN

## 2014-03-04 MED ORDER — LATANOPROST 0.005 % OP SOLN
1.0000 [drp] | Freq: Every day | OPHTHALMIC | Status: DC
  Administered 2014-03-04: 1 [drp] via OPHTHALMIC
  Filled 2014-03-04: qty 2.5

## 2014-03-04 MED ORDER — BACITRACIN-NEOMYCIN-POLYMYXIN 400-5-5000 EX OINT: 1.0000 "application " | TOPICAL_OINTMENT | Freq: Three times a day (TID) | CUTANEOUS | Status: DC | PRN

## 2014-03-04 MED ORDER — ONDANSETRON HCL 4 MG/2ML IJ SOLN
INTRAMUSCULAR | Status: AC
  Filled 2014-03-04: qty 2

## 2014-03-04 MED ORDER — OXYBUTYNIN CHLORIDE 5 MG PO TABS
5.0000 mg | ORAL_TABLET | Freq: Three times a day (TID) | ORAL | Status: DC | PRN
  Administered 2014-03-04: 5 mg via ORAL
  Filled 2014-03-04: qty 1

## 2014-03-04 MED ORDER — CIPROFLOXACIN IN D5W 400 MG/200ML IV SOLN
INTRAVENOUS | Status: DC | PRN
  Administered 2014-03-04: 400 mg via INTRAVENOUS

## 2014-03-04 MED ORDER — SODIUM CHLORIDE 0.9 % IR SOLN: 3000.0000 mL | Status: DC

## 2014-03-04 MED ORDER — SODIUM CHLORIDE 0.9 % IR SOLN
Status: DC | PRN
  Administered 2014-03-04: 15000 mL

## 2014-03-04 MED ORDER — EPHEDRINE SULFATE 50 MG/ML IJ SOLN
INTRAMUSCULAR | Status: AC
  Filled 2014-03-04: qty 1

## 2014-03-04 MED ORDER — CIPROFLOXACIN HCL 500 MG PO TABS
500.0000 mg | ORAL_TABLET | Freq: Two times a day (BID) | ORAL | Status: AC
  Administered 2014-03-04: 500 mg via ORAL
  Filled 2014-03-04: qty 1

## 2014-03-04 MED ORDER — LISINOPRIL 10 MG PO TABS
10.0000 mg | ORAL_TABLET | Freq: Every day | ORAL | Status: DC
  Administered 2014-03-04 – 2014-03-05 (×2): 10 mg via ORAL
  Filled 2014-03-04 (×2): qty 1

## 2014-03-04 MED ORDER — ACETAMINOPHEN 10 MG/ML IV SOLN
1000.0000 mg | Freq: Once | INTRAVENOUS | Status: AC
  Administered 2014-03-04: 1000 mg via INTRAVENOUS
  Filled 2014-03-04: qty 100

## 2014-03-04 MED ORDER — HYDROMORPHONE HCL 1 MG/ML IJ SOLN
0.5000 mg | INTRAMUSCULAR | Status: DC | PRN
  Administered 2014-03-04: 1 mg via INTRAVENOUS
  Filled 2014-03-04: qty 1

## 2014-03-04 MED ORDER — DEXAMETHASONE SODIUM PHOSPHATE 10 MG/ML IJ SOLN
INTRAMUSCULAR | Status: DC | PRN
  Administered 2014-03-04: 10 mg via INTRAVENOUS

## 2014-03-04 MED ORDER — SODIUM CHLORIDE 0.45 % IV SOLN
INTRAVENOUS | Status: DC
  Administered 2014-03-04: 13:00:00 via INTRAVENOUS

## 2014-03-04 MED ORDER — MIDAZOLAM HCL 2 MG/2ML IJ SOLN: 0.5000 mg | Freq: Once | INTRAMUSCULAR | Status: DC | PRN

## 2014-03-04 MED ORDER — DIPHENHYDRAMINE HCL 50 MG/ML IJ SOLN: 12.5000 mg | Freq: Four times a day (QID) | INTRAMUSCULAR | Status: DC | PRN

## 2014-03-04 SURGICAL SUPPLY — 15 items
BAG URINE DRAINAGE (UROLOGICAL SUPPLIES) ×1 IMPLANT
BAG URO CATCHER STRL LF (DRAPE) ×2 IMPLANT
CATH HEMA 3WAY 30CC 22FR COUDE (CATHETERS) ×2 IMPLANT
GLOVE BIOGEL M STRL SZ7.5 (GLOVE) ×2 IMPLANT
GOWN STRL REUS W/TWL XL LVL3 (GOWN DISPOSABLE) ×2 IMPLANT
HOLDER FOLEY CATH W/STRAP (MISCELLANEOUS) ×1 IMPLANT
IV NS IRRIG 3000ML ARTHROMATIC (IV SOLUTION) IMPLANT
KIT ASPIRATION TUBING (SET/KITS/TRAYS/PACK) ×1 IMPLANT
LOOP CUT BIPOLAR 24F LRG (ELECTROSURGICAL) ×1 IMPLANT
MANIFOLD NEPTUNE II (INSTRUMENTS) ×2 IMPLANT
NS IRRIG 1000ML POUR BTL (IV SOLUTION) ×1 IMPLANT
PACK CYSTO (CUSTOM PROCEDURE TRAY) ×2 IMPLANT
SYR 30ML LL (SYRINGE) IMPLANT
SYRINGE IRR TOOMEY STRL 70CC (SYRINGE) ×2 IMPLANT
TUBING CONNECTING 10 (TUBING) ×2 IMPLANT

## 2014-03-04 NOTE — H&P (Signed)
Reason For Visit F/u to review urodynamics   Active Problems Problems  1. Benign prostatic hyperplasia with urinary obstruction (N40.1)   Assessed By: Carolan Clines (Urology); Last Assessed: 14 Jan 2014 2. Erectile dysfunction due to arterial insufficiency (N52.01)   Assessed By: Luanne Bras (Urology); Last Assessed: 12 Jun 2010 3. Increased urinary frequency (R35.0) 4. Nocturia (R35.1)   Assessed By: Carolan Clines (Urology); Last Assessed: 11 Dec 2013 5. Urinary incontinence (R32)   Assessed By: Carolan Clines (Urology); Last Assessed: 11 Dec 2013  History of Present Illness     81 year old male returns today to review urodynamic results. Hx of significant lower urinary tract symptoms, failing tamsulosin therapy. IPSS=13. He complains of urinary urgency, failing oxybutynin therapy. He complains of nocturia, and urge incontinence. He has nocturia 3, urinary frequency. He has decreased in size and force of his urinary stream, intermittency, without difficulty initiating stream with straining. Rectal examination showed normal sphincter tone, 3+ benign prostate examination. His urinalysis was negative. He was felt to have BPH when treated by Dr. Serita Butcher in 2005, and a 2008. He has failed Flomax twice. He is now for video urodynamics to begin to understand his voiding pattern, to see if he has bladder outlet promised, or detrusor problems. His PSA is 4.18.     Video urodynamics on 12/25/13, shows a maximum cystometric capacity of 270 cc, with first sensation at 127 cc, normal desire at 266 cc. The patient went straight to a strong desire with an unstable bladder contraction at 266 cc. The maximum unstable bladder contraction pressure was 18 series of water during the filling phase and 40 for some years water during his voiding, with urgency. The patient was able to inhibit his low amplitude unstable bladder contractions without leakage, but then voided with  urgency.    Leak point pressure: The patient did not leak for Valsalva leak point pressure, with a maximum abdominal pressure generation of 94 cm water.    Pressure flow study: The patient develops a voluntary contraction, with voided volume of 94 cc. The maximum flow rate is 4 cc/s, with detrusor pressure at maximum flow of 32 m water, and maximum pressure of 33 cm of water. Postvoid residual is 160 cc. The contraction was not well sustained, however. Rectal spasms interfere. After the study was over, the patient stood, and voided 150 cc.    ECGs accomplished, and shows trabeculation, but no reflux. He has a closed bladder neck at rest.    He has a small capacity bladder of 270 cc. It appears to be unstable, but he is able to inhibit the low amplitude unstable contractions without leakage, but then voids with severe urgency once given permission. The patient is able to develop a voluntary contraction, but the contraction is not well sustained, and he voids with an interrupted flow. He also voids with rectal spasms. The patient voids inefficiently, with a postvoid residual of 160 cc, but eventually is able to empty his bladder with double voiding. There is no reflux seen.   Past Medical History Problems  1. History of Elevated prostate specific antigen (PSA) (R97.2) 2. History of glaucoma (Z86.69) 3. History of kidney stones (Z87.442) 4. History of urinary frequency (Z87.898) 5. History of Skin Cancer  Surgical History Problems  1. History of Cataract Surgery 2. History of Knee Surgery 3. History of Tonsillectomy  Current Meds 1. Aspirin 81 MG Oral Tablet;  Therapy: (Recorded:01Jun2012) to Recorded 2. Cetirizine HCl - 10 MG Oral Tablet;  Therapy: (Recorded:19Oct2015)  to Recorded 3. Fluticasone Propionate 50 MCG/ACT Nasal Suspension;  Therapy: (Recorded:19Oct2015) to Recorded 4. Latanoprost 0.005 % Ophthalmic Solution;  Therapy: 02Aug2011 to Recorded 5. Lisinopril 10 MG Oral  Tablet;  Therapy: (Recorded:24Jun2013) to Recorded 6. Montelukast Sodium 10 MG Oral Tablet;  Therapy: (Recorded:19Oct2015) to Recorded 7. Simvastatin 20 MG Oral Tablet;  Therapy: [phone] to Recorded 8. Tamsulosin HCl - 0.4 MG Oral Capsule; TAKE 1 CAPSULE Bedtime;  Therapy: [phone] to (Last Rx:19Oct2015)  Requested for: 19Oct2015 Ordered  Allergies Medication  1. Amoxicillin CAPS 2. Sulfa Drugs  Family History Problems  1. Family history of Family Health Status Number Of Children   2 daughters 2. Family history of Hypertension : Mother 3. Family history of Nephrolithiasis : Father  Social History Problems  1. Denied: Alcohol Use 2. Caffeine Use   4 3. Family history of Death In The Family Father   64- emphysema 4. Family history of Death In The Family Mother   3- heart failure 5. Former smoker [phone]) 6. Marital History - Currently Married 7. Occupation:   retired 27. Tobacco Use   1 ppd for 15 years  Review of Systems Genitourinary, constitutional, skin, eye, otolaryngeal, hematologic/lymphatic, cardiovascular, pulmonary, endocrine, musculoskeletal, gastrointestinal, neurological and psychiatric system(s) were reviewed and pertinent findings if present are noted and are otherwise negative.  Genitourinary: urinary frequency, feelings of urinary urgency, nocturia, incontinence, weak urinary stream, urinary stream starts and stops, incomplete emptying of bladder and initiating urination requires straining.  Respiratory: cough.    Vitals Vital Signs [Data Includes: Last 1 Day]  Recorded: [id number] 01:22PM  Blood Pressure: 148 / 69 Temperature: 97.8 F Heart Rate: 70  Physical Exam Constitutional: Well nourished and well developed . No acute distress. The patient appears well hydrated.  ENT:. The ears and nose are normal in appearance.  Neck: The appearance of the neck is normal and no neck mass is present.  Pulmonary: No respiratory distress and normal  respiratory rhythm and effort.  Cardiovascular:. No peripheral edema.  Abdomen: The abdomen is flat. The abdomen is soft and nontender. No masses are palpated. No CVA tenderness. No hernias are palpable. No hepatosplenomegaly noted.  Rectal: Rectal exam demonstrates normal sphincter tone and the anus is normal on inspection. Estimated prostate size is 3+. Normal rectal tone, no rectal masses, prostate is smooth, symmetric and non-tender. The perineum is normal on inspection.  Genitourinary: Examination of the penis demonstrates no discharge, no masses, no lesions and a normal meatus. The scrotum is without lesions. The right epididymis is palpably normal and non-tender. The left epididymis is palpably normal and non-tender. The right testis is non-tender and without masses. The left testis is non-tender and without masses.  Lymphatics: The femoral and inguinal nodes are not enlarged or tender.  Skin: Normal skin turgor, no visible rash and no visible skin lesions.  Neuro/Psych:. Mood and affect are appropriate.    Results/Data Urine [Data Includes: Last 1 Day]   [id number]  COLOR YELLOW   APPEARANCE CLEAR   SPECIFIC GRAVITY 1.020   pH 7.0   GLUCOSE NEG mg/dL  BILIRUBIN NEG   KETONE NEG mg/dL  BLOOD NEG   PROTEIN NEG mg/dL  UROBILINOGEN 1 mg/dL  NITRITE NEG   LEUKOCYTE ESTERASE NEG    Assessment Assessed  1. Benign prostatic hyperplasia with urinary obstruction (N40.1) 2. Obstructive uropathy (N13.9) 3. Urinary urgency (R39.15) 4. Increased urinary frequency (R35.0)  81 yo male post Urodynamics showing obstruction. He has never had surgery before, and we have outlined TURP  for him. He will be in the hospital for 24-48 hrs. He will discuss with his daughter ( nurse at [facility]) and may schedule prn.   Plan Health Maintenance  1. UA With REFLEX; [Do Not Release]; Status:Resulted - Requires Verification;   Done:  [id number] 12:33PM  [facility] TURP.   Signatures Electronically  signed by : Carolan Clines, M.D.; Jan 14 2014  1:58PM EST

## 2014-03-04 NOTE — Anesthesia Procedure Notes (Signed)
Procedure Name: LMA Insertion Date/Time: 03/04/2014 8:59 AM Performed by: Montel Clock Pre-anesthesia Checklist: Patient identified, Emergency Drugs available, Suction available, Patient being monitored and Timeout performed Patient Re-evaluated:Patient Re-evaluated prior to inductionOxygen Delivery Method: Circle system utilized Preoxygenation: Pre-oxygenation with 100% oxygen Intubation Type: IV induction Ventilation: Mask ventilation without difficulty LMA: LMA with gastric port inserted LMA Size: 5.0 Number of attempts: 1 Dental Injury: Teeth and Oropharynx as per pre-operative assessment  Comments: Good seal with LMA supreme#5 with slight tension on lma tip towards cords (circuit over top of head). ?LMA 4 possibly better fit.

## 2014-03-04 NOTE — Anesthesia Preprocedure Evaluation (Signed)
Anesthesia Evaluation  Patient identified by MRN, date of birth, ID band Patient awake    Reviewed: Allergy & Precautions, NPO status , Patient's Chart, lab work & pertinent test results  History of Anesthesia Complications Negative for: history of anesthetic complications  Airway Mallampati: I  TM Distance: >3 FB Neck ROM: Full    Dental  (+) Teeth Intact, Dental Advisory Given   Pulmonary COPDformer smoker (quit 1973),  breath sounds clear to auscultation        Cardiovascular hypertension, Pt. on medications - angina+ CAD (non-obstructive) and DVT Rhythm:Regular Rate:Normal  '14 stress test: no ischemia, EF 63%   Neuro/Psych negative neurological ROS     GI/Hepatic Neg liver ROS,   Endo/Other  negative endocrine ROS  Renal/GU negative Renal ROS     Musculoskeletal  (+) Arthritis -,   Abdominal   Peds  Hematology negative hematology ROS (+)   Anesthesia Other Findings   Reproductive/Obstetrics                             Anesthesia Physical Anesthesia Plan  ASA: III  Anesthesia Plan: General   Post-op Pain Management:    Induction: Intravenous  Airway Management Planned: LMA  Additional Equipment:   Intra-op Plan:   Post-operative Plan:   Informed Consent: I have reviewed the patients History and Physical, chart, labs and discussed the procedure including the risks, benefits and alternatives for the proposed anesthesia with the patient or authorized representative who has indicated his/her understanding and acceptance.   Dental advisory given  Plan Discussed with: CRNA and Surgeon  Anesthesia Plan Comments: (Plan routine monitors, GA- LMA OK)        Anesthesia Quick Evaluation

## 2014-03-04 NOTE — Op Note (Signed)
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 Gaynelle Arabian, MD  First assistant:  None  Anesthesia:  General LMA   Preparation:  After appropriate preanesthesia, the patient was brought to the operative room, placed on the operating table in the dorsal supine position where general LMA anesthesia was introduced. He was then replaced in the dorsal lithotomy position with pubis was prepped with Betadine solution and draped in usual fashion. The history was double checked. The arm and was double checked. Cystourethroscopy was accomplished under direct vision shows normal-appearing distal urethra, and normal-appearing proximal urethra. The prostate was trilobar in appearance, with elevated bladder neck. The bladder itself showed trabeculation, and early cellule formation. There was no evidence of diverticular formation, or bladder stone. 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 Transrectal resection with desiccation of the tumor base was accomplished, with care taken to avoid any injury to the ureter itself. This was sent separately to the laboratory.  Attention was then directed to the prostate, which was then resected from the 7:00 to the 5:00 this and, from the 11:00 to the 7:00 position, and from the o'clock to the 5:00 position. Chips were sent to laboratory for evaluation for prostate cancer. Following this, the prostate fossa was cauterized extensively. A size 22 hematuria catheter was placed to continuous irrigation and traction. The patient was awakened and taken to recovery room in good condition.  Review history:   Active Problems Problems  1. Benign prostatic  hyperplasia with urinary obstruction (N40.1)  Assessed By: Carolan Clines (Urology); Last Assessed: 14 Jan 2014 2. Erectile dysfunction due to arterial insufficiency (N52.01)  Assessed By: Luanne Bras (Urology); Last Assessed: 12 Jun 2010 3. Increased urinary frequency (R35.0) 4. Nocturia (R35.1)  Assessed By: Carolan Clines (Urology); Last Assessed: 11 Dec 2013 5. Urinary incontinence (R32)  Assessed By: Carolan Clines (Urology); Last Assessed: 11 Dec 2013  History of Present Illness    81 year old male returns today to review urodynamic results. Hx of significant lower urinary tract symptoms, failing tamsulosin therapy. IPSS=13. He complains of urinary urgency, failing oxybutynin therapy. He complains of nocturia, and urge incontinence. He has nocturia 3, urinary frequency. He has decreased in size and force of his urinary stream, intermittency, without difficulty initiating stream with straining. Rectal examination showed normal sphincter tone, 3+ benign prostate examination. His urinalysis was negative. He was felt to have BPH when treated by Dr. Serita Butcher in 2005, and a 2008. He has failed Flomax twice. He is now for video urodynamics to begin to understand his voiding pattern, to see if he has bladder outlet promised, or detrusor problems. His PSA is 4.18.    Statement of  Likelihood of Success: Excellent. TIME-OUT observed.:  Procedure:

## 2014-03-04 NOTE — Transfer of Care (Signed)
Immediate Anesthesia Transfer of Care Note  Patient: Jesus Harmon  Procedure(s) Performed: Procedure(s) (LRB): TRANSURETHRAL RESECTION OF THE PROSTATE, TRANSURETHRAL RESECTION OF THE BLADDER TUMOR (N/A)  Patient Location: PACU  Anesthesia Type: General  Level of Consciousness: sedated, patient cooperative and responds to stimulation  Airway & Oxygen Therapy: Patient Spontanous Breathing and Patient connected to face mask oxgen  Post-op Assessment: Report given to PACU RN and Post -op Vital signs reviewed and stable  Post vital signs: Reviewed and stable  Complications: No apparent anesthesia complications

## 2014-03-04 NOTE — Anesthesia Postprocedure Evaluation (Signed)
  Anesthesia Post-op Note  Patient: Jesus Harmon  Procedure(s) Performed: Procedure(s): TRANSURETHRAL RESECTION OF THE PROSTATE, TRANSURETHRAL RESECTION OF THE BLADDER TUMOR (N/A)  Patient Location: PACU  Anesthesia Type:General  Level of Consciousness: awake, alert , oriented and patient cooperative  Airway and Oxygen Therapy: Patient Spontanous Breathing and Patient connected to nasal cannula oxygen  Post-op Pain: none  Post-op Assessment: Post-op Vital signs reviewed, Patient's Cardiovascular Status Stable, Respiratory Function Stable, Patent Airway, No signs of Nausea or vomiting and Pain level controlled  Post-op Vital Signs: Reviewed and stable  Last Vitals:  Filed Vitals:   03/04/14 1100  BP:   Pulse: 58  Temp:   Resp: 14    Complications: No apparent anesthesia complications

## 2014-03-04 NOTE — Interval H&P Note (Signed)
History and Physical Interval Note:  03/04/2014 8:45 AM  Jesus Harmon  has presented today for surgery, with the diagnosis of BENIGN PROSTATIC HYPERPLASIA  The various methods of treatment have been discussed with the patient and family. After consideration of risks, benefits and other options for treatment, the patient has consented to  Procedure(s): TRANSURETHRAL RESECTION OF THE PROSTATE WITH GYRUS INSTRUMENTS (N/A) as a surgical intervention .  The patient's history has been reviewed, patient examined, no change in status, stable for surgery.  I have reviewed the patient's chart and labs.  Questions were answered to the patient's satisfaction.     Carolan Clines I

## 2014-03-04 NOTE — Progress Notes (Signed)
Urology Progress Note  Day of Surgery   Subjective: POD L Post TURP. Urine pink on CBI. No clot. + traction.     No acute urologic events overnight. Ambulation:   positive Flatus:    positive Bowel movement  negative  Pain: some relief  Objective:  Blood pressure 127/71, pulse 77, temperature 97.8 F (36.6 C), temperature source Oral, resp. rate 20, height 6' (1.829 m), weight 76.204 kg (168 lb), SpO2 97 %.  Physical Exam:  General:  No acute distress, awake Extremities: extremities normal, atraumatic, no cyanosis or edema Genitourinary:  Normal bladder Foley: draining well.        No results for input(s): HGB, WBC, PLT in the last 72 hours.  No results for input(s): NA, K, CL, CO2, BUN, CREATININE, CALCIUM, GFRNONAA, GFRAA in the last 72 hours.  Invalid input(s): MAGNESIUM   No results for input(s): INR, APTT in the last 72 hours.  Invalid input(s): PT   Invalid input(s): ABG  Assessment/Plan:  Continue any current medications.   Probable remove CBI and traction in AM.

## 2014-03-05 ENCOUNTER — Encounter (HOSPITAL_COMMUNITY): Payer: Self-pay | Admitting: Urology

## 2014-03-05 DIAGNOSIS — N400 Enlarged prostate without lower urinary tract symptoms: Secondary | ICD-10-CM | POA: Diagnosis not present

## 2014-03-05 MED ORDER — PHENAZOPYRIDINE HCL 200 MG PO TABS
200.0000 mg | ORAL_TABLET | Freq: Three times a day (TID) | ORAL | Status: DC | PRN
Start: 2014-03-05 — End: 2015-04-18

## 2014-03-05 MED ORDER — TRIMETHOPRIM 100 MG PO TABS: 100.0000 mg | ORAL_TABLET | ORAL | Status: DC

## 2014-03-05 MED ORDER — TRAMADOL-ACETAMINOPHEN 37.5-325 MG PO TABS: 1.0000 | ORAL_TABLET | Freq: Four times a day (QID) | ORAL | Status: DC | PRN

## 2014-03-05 NOTE — Discharge Summary (Signed)
Physician Discharge Summary  Patient ID: Jesus Harmon MRN: [id number] DOB/AGE: [birth year] 81 y.o.  Admit date: 03/04/2014 Discharge date: 03/05/2014  Admission Diagnoses: BENIGN PROSTATIC HYPERPLASIA  Discharge Diagnoses:  Active Problems:   Benign hypertrophy of prostate   Bladder Cancer  Discharged Condition: stable  Hospital Course:  TURP/TUR-BT 03/04/14  Significant Diagnostic Studies: Path. pending Discharge Exam: Blood pressure 108/55, pulse 54, temperature 97.8 F (36.6 C), temperature source Oral, resp. rate 16, height 6' (1.829 m), weight 76.204 kg (168 lb), SpO2 97 %.   Disposition: 01-Home or Self Care  Discharge Instructions    Continue foley catheter    Complete by:  As directed      Discharge patient    Complete by:  As directed      Discontinue IV    Complete by:  As directed             Medication List    STOP taking these medications        aspirin 81 MG tablet     tamsulosin 0.4 MG Caps capsule  Commonly known as:  FLOMAX      TAKE these medications        cetirizine 10 MG tablet  Commonly known as:  ZYRTEC  Take 10 mg by mouth daily.     latanoprost 0.005 % ophthalmic solution  Commonly known as:  XALATAN  Place 1 drop into both eyes at bedtime.     lisinopril 10 MG tablet  Commonly known as:  PRINIVIL,ZESTRIL  TAKE 1 TABLET EVERY DAY     montelukast 10 MG tablet  Commonly known as:  SINGULAIR  Take 10 mg by mouth every evening.     NITROSTAT 0.4 MG SL tablet  Generic drug:  nitroGLYCERIN  1 TABLET UNDER TONGUE AT ONSET OF CHEST PAIN YOU MAY REPEAT EVERY 5 MINUTES FOR UP TO 3 DOSES.     phenazopyridine 200 MG tablet  Commonly known as:  PYRIDIUM  Take 1 tablet (200 mg total) by mouth 3 (three) times daily as needed for pain. Will discolor urine.     simvastatin 20 MG tablet  Commonly known as:  ZOCOR  TAKE 1 TABLET BY MOUTH DAILY     traMADol-acetaminophen 37.5-325 MG per tablet  Commonly known as:  ULTRACET  Take 1  tablet by mouth every 6 (six) hours as needed.     trimethoprim 100 MG tablet  Commonly known as:  TRIMPEX  Take 1 tablet (100 mg total) by mouth 1 day or 1 dose.           Follow-up Information    Follow up with Ailene Rud, MD.   Specialty:  Urology   Why:  call [facility] [phone] for  catheter removal Wednsday or Thursday.    Contact information:   [address] [place] [address] [phone]       Signed: Carolan Clines I 03/05/2014, 8:03 AM

## 2014-03-05 NOTE — Care Management Note (Signed)
    Page 1 of 1   03/05/2014     11:38:36 AM CARE MANAGEMENT NOTE 03/05/2014  Patient:  Jesus Harmon, Jesus Harmon   Account Number:  [id number]  Date Initiated:  03/05/2014  Documentation initiated by:  Dessa Phi  Subjective/Objective Assessment:   81 y/o m admitted w/BPH, TURP.     Action/Plan:   From home.   Anticipated DC Date:  03/05/2014   Anticipated DC Plan:  [place]  CM consult      Choice offered to / List presented to:             Status of service:  Completed, signed off Medicare Important Message given?   (If response is "NO", the following Medicare IM given date fields will be blank) Date Medicare IM given:   Medicare IM given by:   Date Additional Medicare IM given:   Additional Medicare IM given by:    Discharge Disposition:  HOME/SELF CARE  Per UR Regulation:  Reviewed for med. necessity/level of care/duration of stay  If discussed at [place] of Stay Meetings, dates discussed:    Comments:  03/05/14 Dessa Phi RN BSN NCM [phone] s/p TURP.d/c home no needs or orders.

## 2014-03-05 NOTE — Discharge Instructions (Signed)
Benign Prostatic Hyperplasia An enlarged prostate (benign prostatic hyperplasia) is common in older men. You may experience the following:  Weak urine stream.  Dribbling.  Feeling like the bladder has not emptied completely.  Difficulty starting urination.  Getting up frequently at night to urinate.  Urinating more frequently during the day. HOME CARE INSTRUCTIONS  Monitor your prostatic hyperplasia for any changes. The following actions may help to alleviate any discomfort you are experiencing:  Give yourself time when you urinate.  Stay away from alcohol.  Avoid beverages containing caffeine, such as coffee, tea, and colas, because they can make the problem worse.  Avoid decongestants, antihistamines, and some prescription medicines that can make the problem worse.  Follow up with your health care provider for further treatment as recommended. SEEK MEDICAL CARE IF:  You are experiencing progressive difficulty voiding.  Your urine stream is progressively getting narrower.  You are awaking from sleep with the urge to void more frequently.  You are constantly feeling the need to void.  You experience loss of urine, especially in small amounts. SEEK IMMEDIATE MEDICAL CARE IF:   You develop increased pain with urination or are unable to urinate.  You develop severe abdominal pain, vomiting, a high fever, or fainting.  You develop back pain or blood in your urine. MAKE SURE YOU:   Understand these instructions.  Will watch your condition.  Will get help right away if you are not doing well or get worse. Document Released: 12/28/2004 Document Revised: 08/30/2012 Document Reviewed: 05/30/2012 [facility] Patient Information 2015 [place], [place]. This information is not intended to replace advice given to you by your health care provider. Make sure you discuss any questions you have with your health care provider. Post transurethral resection of the prostate (TURP)  instructions  Your recent prostate surgery requires very special post hospital care. Despite the fact that no skin incisions were used the area around the prostate incision is quite raw and is covered with a scab to promote healing and prevent bleeding. Certain cautions are needed to assure that the scab is not disturbed of the next 2-3 weeks while the healing proceeds.  Because the raw surface in your prostate and the irritating effects of urine you may expect frequency of urination and/or urgency (a stronger desire to urinate) and perhaps even getting up at night more often. This will usually resolve or improve slowly over the healing period. You may see some blood in your urine over the first 6 weeks. Do not be alarmed, even if the urine was clear for a while. Get off your feet and drink lots of fluids until clearing occurs. If you start to pass clots or don't improve call us.  Diet:  You may return to your normal diet immediately. Because of the raw surface of your bladder, alcohol, spicy foods, foods high in acid and drinks with caffeine may cause irritation or frequency and should be used in moderation. To keep your urine flowing freely and avoid constipation, drink plenty of fluids during the day (8-10 glasses). Tip: Avoid cranberry juice because it is very acidic.  Activity:  Your physical activity doesn't need to be restricted. However, if you are very active, you may see some blood in the urine. We suggest that you reduce your activity under the circumstances until the bleeding has stopped.  Bowels:  It is important to keep your bowels regular during the postoperative period. Straining with bowel movements can cause bleeding. A bowel movement every other day is  reasonable. Use a mild laxative if needed, such as milk of magnesia 2-3 tablespoons, or 2 Dulcolax tablets. Call if you continue to have problems. If you had been taking narcotics for pain, before, during or after your surgery, you  may be constipated. Take a laxative if necessary.  Medication:  You should resume your pre-surgery medications unless told not to. In addition you may be given an antibiotic to prevent or treat infection. Antibiotics are not always necessary. All medication should be taken as prescribed until the bottles are finished unless you are having an unusual reaction to one of the drugs.     Problems you should report to [place]:  a. Fever greater than 101F. b. Heavy bleeding, or clots (see notes above about blood in urine). c. Inability to urinate. d. Drug reactions (hives, rash, nausea, vomiting, diarrhea). e. Severe burning or pain with urination that is not improving.

## 2014-03-05 NOTE — Progress Notes (Signed)
Urology Progress Note  1 Day Post-Op   Subjective: POD #1. AF VSS. Urine pink. No clots.      No acute urologic events overnight. Ambulation:   positive Flatus:    positive Bowel movement  negative  Pain: complete resolution  Objective:  Blood pressure 108/55, pulse 54, temperature 97.8 F (36.6 C), temperature source Oral, resp. rate 16, height 6' (1.829 m), weight 76.204 kg (168 lb), SpO2 97 %.  Physical Exam:  General:  No acute distress, awake Extremities: extremities normal, atraumatic, no cyanosis or edema Genitourinary:  Normal bladder Foley: no clots. Pink urine    I/O last 3 completed shifts: In: 9187.5 [P.O.:600; I.V.:1587.5; Other:7000] Out: 9775 [Urine:9775]  No results for input(s): HGB, WBC, PLT in the last 72 hours.  No results for input(s): NA, K, CL, CO2, BUN, CREATININE, CALCIUM, GFRNONAA, GFRAA in the last 72 hours.  Invalid input(s): MAGNESIUM   No results for input(s): INR, APTT in the last 72 hours.  Invalid input(s): PT   Invalid input(s): ABG  Assessment/Plan:  Catheter not removed. Will allow d/c with foley in, to RTC in AM fopr catheter removal.

## 2014-03-07 ENCOUNTER — Emergency Department (HOSPITAL_COMMUNITY)
Admission: EM | Admit: 2014-03-07 | Discharge: 2014-03-07 | Disposition: A | Discharge status: Home / Self Care | Payer: Medicare Other | Attending: Emergency Medicine | Admitting: Emergency Medicine

## 2014-03-07 ENCOUNTER — Encounter (HOSPITAL_COMMUNITY): Payer: Self-pay | Admitting: Emergency Medicine

## 2014-03-07 DIAGNOSIS — I2510 Atherosclerotic heart disease of native coronary artery without angina pectoris: Secondary | ICD-10-CM | POA: Insufficient documentation

## 2014-03-07 DIAGNOSIS — Z79899 Other long term (current) drug therapy: Secondary | ICD-10-CM | POA: Insufficient documentation

## 2014-03-07 DIAGNOSIS — N320 Bladder-neck obstruction: Secondary | ICD-10-CM | POA: Diagnosis not present

## 2014-03-07 DIAGNOSIS — M1990 Unspecified osteoarthritis, unspecified site: Secondary | ICD-10-CM | POA: Diagnosis not present

## 2014-03-07 DIAGNOSIS — E785 Hyperlipidemia, unspecified: Secondary | ICD-10-CM | POA: Insufficient documentation

## 2014-03-07 DIAGNOSIS — Z9889 Other specified postprocedural states: Secondary | ICD-10-CM | POA: Diagnosis not present

## 2014-03-07 DIAGNOSIS — Z87891 Personal history of nicotine dependence: Secondary | ICD-10-CM | POA: Insufficient documentation

## 2014-03-07 DIAGNOSIS — R339 Retention of urine, unspecified: Secondary | ICD-10-CM

## 2014-03-07 DIAGNOSIS — Z880 Allergy status to penicillin: Secondary | ICD-10-CM | POA: Diagnosis not present

## 2014-03-07 DIAGNOSIS — R310 Gross hematuria: Secondary | ICD-10-CM | POA: Insufficient documentation

## 2014-03-07 DIAGNOSIS — R3919 Other difficulties with micturition: Secondary | ICD-10-CM | POA: Insufficient documentation

## 2014-03-07 DIAGNOSIS — I10 Essential (primary) hypertension: Secondary | ICD-10-CM | POA: Insufficient documentation

## 2014-03-07 DIAGNOSIS — Z85828 Personal history of other malignant neoplasm of skin: Secondary | ICD-10-CM | POA: Insufficient documentation

## 2014-03-07 NOTE — Discharge Instructions (Signed)
Foley Catheter Care °A Foley catheter is a soft, flexible tube that is placed into the bladder to drain urine. A Foley catheter may be inserted if: °· You leak urine or are not able to control when you urinate (urinary incontinence). °· You are not able to urinate when you need to (urinary retention). °· You had prostate surgery or surgery on the genitals. °· You have certain medical conditions, such as multiple sclerosis, dementia, or a spinal cord injury. °If you are going home with a Foley catheter in place, follow the instructions below. °TAKING CARE OF THE CATHETER °1. Wash your hands with soap and water. °2. Using mild soap and warm water on a clean washcloth: °· Clean the area on your body closest to the catheter insertion site using a circular motion, moving away from the catheter. Never wipe toward the catheter because this could sweep bacteria up into the urethra and cause infection. °· Remove all traces of soap. Pat the area dry with a clean towel. For males, reposition the foreskin. °3. Attach the catheter to your leg so there is no tension on the catheter. Use adhesive tape or a leg strap. If you are using adhesive tape, remove any sticky residue left behind by the previous tape you used. °4. Keep the drainage bag below the level of the bladder, but keep it off the floor. °5. Check throughout the day to be sure the catheter is working and urine is draining freely. Make sure the tubing does not become kinked. °6. Do not pull on the catheter or try to remove it. Pulling could damage internal tissues. °TAKING CARE OF THE DRAINAGE BAGS °You will be given two drainage bags to take home. One is a large overnight drainage bag, and the other is a smaller leg bag that fits underneath clothing. You may wear the overnight bag at any time, but you should never wear the smaller leg bag at night. Follow the instructions below for how to empty, change, and clean your drainage bags. °Emptying the Drainage Bag °You must  empty your drainage bag when it is  -½ full or at least 2-3 times a day. °1. Wash your hands with soap and water. °2. Keep the drainage bag below your hips, below the level of your bladder. This stops urine from going back into the tubing and into your bladder. °3. Hold the dirty bag over the toilet or a clean container. °4. Open the pour spout at the bottom of the bag and empty the urine into the toilet or container. Do not let the pour spout touch the toilet, container, or any other surface. Doing so can place bacteria on the bag, which can cause an infection. °5. Clean the pour spout with a gauze pad or cotton ball that has rubbing alcohol on it. °6. Close the pour spout. °7. Attach the bag to your leg with adhesive tape or a leg strap. °8. Wash your hands well. °Changing the Drainage Bag °Change your drainage bag once a month or sooner if it starts to smell bad or look dirty. Below are steps to follow when changing the drainage bag. °1. Wash your hands with soap and water. °2. Pinch off the rubber catheter so that urine does not spill out. °3. Disconnect the catheter tube from the drainage tube at the connection valve. Do not let the tubes touch any surface. °4. Clean the end of the catheter tube with an alcohol wipe. Use a different alcohol wipe to clean the   end of the drainage tube. 5. Connect the catheter tube to the drainage tube of the clean drainage bag. 6. Attach the new bag to the leg with adhesive tape or a leg strap. Avoid attaching the new bag too tightly. 7. Wash your hands well. Cleaning the Drainage Bag 1. Wash your hands with soap and water. 2. Wash the bag in warm, soapy water. 3. Rinse the bag thoroughly with warm water. 4. Fill the bag with a solution of white vinegar and water (1 cup vinegar to 1 qt warm water [.2 L vinegar to 1 L warm water]). Close the bag and soak it for 30 minutes in the solution. 5. Rinse the bag with warm water. 6. Hang the bag to dry with the pour spout open  and hanging downward. 7. Store the clean bag (once it is dry) in a clean plastic bag. 8. Wash your hands well. PREVENTING INFECTION  Wash your hands before and after handling your catheter.  Take showers daily and wash the area where the catheter enters your body. Do not take baths. Replace wet leg straps with dry ones, if this applies.  Do not use powders, sprays, or lotions on the genital area. Only use creams, lotions, or ointments as directed by your caregiver.  For females, wipe from front to back after each bowel movement.  Drink enough fluids to keep your urine clear or pale yellow unless you have a fluid restriction.  Do not let the drainage bag or tubing touch or lie on the floor.  Wear cotton underwear to absorb moisture and to keep your skin drier. SEEK MEDICAL CARE IF:   Your urine is cloudy or smells unusually bad.  Your catheter becomes clogged.  You are not draining urine into the bag or your bladder feels full.  Your catheter starts to leak. SEEK IMMEDIATE MEDICAL CARE IF:   You have pain, swelling, redness, or pus where the catheter enters the body.  You have pain in the abdomen, legs, lower back, or bladder.  You have a fever.  You see blood fill the catheter, or your urine is pink or red.  You have nausea, vomiting, or chills.  Your catheter gets pulled out. MAKE SURE YOU:   Understand these instructions.  Will watch your condition.  Will get help right away if you are not doing well or get worse. Document Released: 12/28/2004 Document Revised: 05/14/2013 Document Reviewed: 12/20/2011 [facility] Patient Information 2015 [place], [place]. This information is not intended to replace advice given to you by your health care provider. Make sure you discuss any questions you have with your health care provider. Hematuria Hematuria is blood in your urine. It can be caused by a bladder infection, kidney infection, prostate infection, kidney stone, or cancer  of your urinary tract. Infections can usually be treated with medicine, and a kidney stone usually will pass through your urine. If neither of these is the cause of your hematuria, further workup to find out the reason may be needed. It is very important that you tell your health care provider about any blood you see in your urine, even if the blood stops without treatment or happens without causing pain. Blood in your urine that happens and then stops and then happens again can be a symptom of a very serious condition. Also, pain is not a symptom in the initial stages of many urinary cancers. HOME CARE INSTRUCTIONS   Drink lots of fluid, 3-4 quarts a day. If you have been  diagnosed with an infection, cranberry juice is especially recommended, in addition to large amounts of water.  Avoid caffeine, tea, and carbonated beverages because they tend to irritate the bladder.  Avoid alcohol because it may irritate the prostate.  Take all medicines as directed by your health care provider.  If you were prescribed an antibiotic medicine, finish it all even if you start to feel better.  If you have been diagnosed with a kidney stone, follow your health care provider's instructions regarding straining your urine to catch the stone.  Empty your bladder often. Avoid holding urine for long periods of time.  After a bowel movement, women should cleanse front to back. Use each tissue only once.  Empty your bladder before and after sexual intercourse if you are a male. SEEK MEDICAL CARE IF: 7. You develop back pain. 8. You have a fever. 9. You have a feeling of sickness in your stomach (nausea) or vomiting. 10. Your symptoms are not better in 3 days. Return sooner if you are getting worse. SEEK IMMEDIATE MEDICAL CARE IF:  9. You develop severe vomiting and are unable to keep the medicine down. 10. You develop severe back or abdominal pain despite taking your medicines. 11. You begin passing a large  amount of blood or clots in your urine. 12. You feel extremely weak or faint, or you pass out. MAKE SURE YOU:  8. Understand these instructions. 9. Will watch your condition. 10. Will get help right away if you are not doing well or get worse. Document Released: 12/28/2004 Document Revised: 05/14/2013 Document Reviewed: 08/28/2012 [facility] Patient Information 2015 [place], [place]. This information is not intended to replace advice given to you by your health care provider. Make sure you discuss any questions you have with your health care provider.

## 2014-03-07 NOTE — ED Notes (Signed)
Family at bedside. 

## 2014-03-07 NOTE — ED Provider Notes (Signed)
CSN: [id number]     Arrival date & time 03/07/14  0300 History   First MD Initiated Contact with Patient 03/07/14 [phone]     Chief Complaint  Patient presents with  . unable to void      (Consider location/radiation/quality/duration/timing/severity/associated sxs/prior Treatment) HPI Comments: PT comes in with cc of bladder pain. 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 Pt reports that he has not voided for the few hours, and just having some dribbling. He was sent home with a foley catheter, that was subsequently removed.  The history is provided by the patient.    Past Medical History  Diagnosis Date  . CAD (coronary artery disease)     a. LHC 10/2009:  pLAD 50%, small D1 70-80% (too small for PCI), EF 65% => med Rx  b. Myoview 10/2009: EF 65%, no ischemia;  c.  ETT-MV 3/14:  No ischemia, EF 63%  . HTN (hypertension)   . HLD (hyperlipidemia)   . Arthritis     back and neck   . Cancer     skin cancer on ear    Past Surgical History  Procedure Laterality Date  . Knee arthroscopy    . Cataract surgery       bilateral   . Transurethral resection of prostate N/A 03/04/2014    Procedure: TRANSURETHRAL RESECTION OF THE PROSTATE, TRANSURETHRAL RESECTION OF THE BLADDER TUMOR;  Surgeon: Ailene Rud, MD;  Location: [facility];  Service: Urology;  Laterality: N/A;   Family History  Problem Relation Age of Onset  . Sudden death Mother     sudden cardiac arrest  . COPD Father   . Heart failure Father     CHF   History  Substance Use Topics  . Smoking status: Former Smoker    Quit date: 01/12/1971  . Smokeless tobacco: Never Used  . Alcohol Use: No    Review of Systems  Constitutional: Negative for activity change and appetite change.  Respiratory: Negative for cough and shortness of breath.   Cardiovascular: Negative for chest pain.  Gastrointestinal: Positive for abdominal pain.  Negative for nausea.  Genitourinary: Negative for dysuria and flank pain.      Allergies  Amoxicillin and Sulfonamide derivatives  Home Medications   Prior to Admission medications   Medication Sig Start Date End Date Taking? Authorizing Provider  cetirizine (ZYRTEC) 10 MG tablet Take 10 mg by mouth daily. 01/19/14  Yes Historical Provider, MD  latanoprost (XALATAN) 0.005 % ophthalmic solution Place 1 drop into both eyes at bedtime.    Yes Historical Provider, MD  lisinopril (PRINIVIL,ZESTRIL) 10 MG tablet TAKE 1 TABLET EVERY DAY 12/31/13  Yes Blane Ohara, MD  montelukast (SINGULAIR) 10 MG tablet Take 10 mg by mouth every evening.  01/14/14  Yes Historical Provider, MD  NITROSTAT 0.4 MG SL tablet 1 TABLET UNDER TONGUE AT ONSET OF CHEST PAIN YOU MAY REPEAT EVERY 5 MINUTES FOR UP TO 3 DOSES. 09/12/11  Yes Renella Cunas, MD  phenazopyridine (PYRIDIUM) 200 MG tablet Take 1 tablet (200 mg total) by mouth 3 (three) times daily as needed for pain. Will discolor urine. 03/05/14  Yes Ailene Rud, MD  simvastatin (ZOCOR) 20 MG tablet TAKE 1 TABLET BY MOUTH EVERY DAY 03/05/14  Yes Blane Ohara, MD  traMADol-acetaminophen (ULTRACET) 37.5-325 MG per tablet Take 1 tablet by mouth every 6 (six) hours as needed. Patient taking differently: Take  1 tablet by mouth every 6 (six) hours as needed for moderate pain.  03/05/14  Yes Ailene Rud, MD  trimethoprim (TRIMPEX) 100 MG tablet Take 1 tablet (100 mg total) by mouth 1 day or 1 dose. 03/05/14  Yes Sigmund Joya San, MD   BP 118/47 mmHg  Pulse 65  Temp(Src) 97.5 F (36.4 C) (Oral)  Resp 18  SpO2 95% Physical Exam  Constitutional: He is oriented to person, place, and time. He appears well-developed.  HENT:  Head: Normocephalic and atraumatic.  Eyes: Conjunctivae and EOM are normal. Pupils are equal, round, and reactive to light.  Neck: Normal range of motion. Neck supple.  Cardiovascular: Normal rate and regular rhythm.    Pulmonary/Chest: Effort normal and breath sounds normal.  Abdominal: Soft. Bowel sounds are normal. He exhibits distension. There is tenderness. There is no rebound and no guarding.  Neurological: He is alert and oriented to person, place, and time.  Skin: Skin is warm.  Nursing note and vitals reviewed.   ED Course  Procedures (including critical care time) Labs Review Labs Reviewed - No data to display  Imaging Review No results found.   EKG Interpretation None      MDM   Final diagnoses:  Unable to void  Bladder obstruction  Gross hematuria   Pt with bladder pain. Hx of recent urologic surgery, and he is not voiding anymore. Foley catheter placed, and irrigation started. Pt has gross hematuria, no large clots. Post irrigation, the urine is now pink in color. Pt will be discharged. Asked to contact Urology as soon as possible.     Varney Biles, MD 03/07/14 [phone]

## 2014-03-07 NOTE — ED Notes (Signed)
Bladder Irrigation clamped at this time. Patient urine clear with pink tinge, will con't to monitor that urine remains clear without clots.

## 2014-03-07 NOTE — ED Notes (Signed)
Dr. Nanavati at bedside 

## 2014-03-07 NOTE — ED Notes (Signed)
Patient catheter changed to leg bag. Patient, his wife, and daughter instructed on how to change leg bag to standard bag. Patient discharged to home with leg bag. Patient also given a standard drainage bag for home use.

## 2014-03-07 NOTE — ED Notes (Signed)
Bed: WA25 Expected date:  Expected time:  Means of arrival:  Comments: EMS  

## 2014-03-07 NOTE — ED Notes (Signed)
Per EMS, patient from home, states he was discharged on Tuesday following prostate surgery. Patient was catheterized during that time. Patient states he is unable to void x24 hours, is having severe pain in his bladder and when he is able to void it is only a few drops which contain blood. Patient family en route. Patient is alert & oriented x4.

## 2014-03-07 NOTE — ED Notes (Signed)
Patient catheter hooked to 3037mL NS irrigation bag.

## 2014-03-11 ENCOUNTER — Telehealth: Payer: Self-pay | Admitting: Cardiovascular Disease

## 2014-03-11 NOTE — Telephone Encounter (Signed)
I spoke with Jesus Harmon and the pt has some mild edema in his ankles and lower legs, denies SOB. The pt recently underwent urology procedure and has had a few set backs.  The pt just got his foley removed this morning for the second time. The pt has been pushing fluids over the weekend to help flush his kidneys and he has not been able to elevate legs due to foley bag.  I advised Jesus Harmon that the pt needs to elevate legs and watch the salt in his diet. Jesus Harmon will continue to monitor the pt's edema and will contact the office is he is still having problems at the end of the week.

## 2014-03-11 NOTE — Telephone Encounter (Signed)
New Msg       Pt c/o swelling: STAT is pt has developed SOB within 24 hours  1. How long have you been experiencing swelling? Last 24- 48hrs  2. Where is the swelling located? Lower extremeties   3.  Are you currently taking a "fluid pill"? No   4.  Are you currently SOB? No  5.  Have you traveled recently? No    Please return call to pt daughter Manon Hilding.

## 2014-04-03 ENCOUNTER — Ambulatory Visit: Payer: Medicare Other | Admitting: Cardiovascular Disease

## 2014-04-24 ENCOUNTER — Encounter: Payer: Self-pay | Admitting: Cardiovascular Disease

## 2014-07-02 ENCOUNTER — Other Ambulatory Visit: Payer: Self-pay | Admitting: Cardiovascular Disease

## 2014-07-14 ENCOUNTER — Other Ambulatory Visit: Payer: Self-pay | Admitting: Cardiovascular Disease

## 2014-07-17 ENCOUNTER — Other Ambulatory Visit: Payer: Self-pay | Admitting: Cardiovascular Disease

## 2014-10-28 ENCOUNTER — Other Ambulatory Visit: Payer: Self-pay | Admitting: Cardiovascular Disease

## 2014-11-12 ENCOUNTER — Other Ambulatory Visit: Payer: Self-pay | Admitting: Cardiovascular Disease

## 2015-01-12 HISTORY — PX: CARDIAC CATHETERIZATION: SHX172

## 2015-01-24 ENCOUNTER — Other Ambulatory Visit: Payer: Self-pay | Admitting: *Deleted

## 2015-01-24 MED ORDER — LISINOPRIL 10 MG PO TABS: 10.0000 mg | ORAL_TABLET | Freq: Every day | ORAL | Status: DC

## 2015-01-24 MED FILL — SIMVASTATIN 20 MG TABLET: 20 | 30 days supply | Qty: 30 | Fill #0

## 2015-01-24 MED FILL — LISINOPRIL 10 MG TABLET: 10 | 90 days supply | Qty: 90 | Fill #0

## 2015-01-24 MED FILL — LATANOPROST 0.005% EYE DRP: 0.005 | 25 days supply | Qty: 3 | Fill #0

## 2015-02-26 DIAGNOSIS — M5137 Other intervertebral disc degeneration, lumbosacral region: Secondary | ICD-10-CM | POA: Diagnosis not present

## 2015-02-27 MED FILL — SIMVASTATIN 20 MG TABLET: 20 | 30 days supply | Qty: 30 | Fill #1

## 2015-03-26 ENCOUNTER — Other Ambulatory Visit: Payer: Self-pay | Admitting: Cardiovascular Disease

## 2015-03-26 MED FILL — LATANOPROST 0.005% EYE DRP: 0.005 | 25 days supply | Qty: 3 | Fill #1

## 2015-03-26 NOTE — Telephone Encounter (Signed)
Please forward to PCP

## 2015-03-27 MED FILL — SIMVASTATIN 20 MG TABLET: 20 | 30 days supply | Qty: 30 | Fill #0

## 2015-04-07 DIAGNOSIS — H401131 Primary open-angle glaucoma, bilateral, mild stage: Secondary | ICD-10-CM | POA: Diagnosis not present

## 2015-04-07 MED FILL — OFLOXACIN 0.3% EYE DROPS: 0.3 | 10 days supply | Qty: 5 | Fill #0

## 2015-04-11 ENCOUNTER — Ambulatory Visit: Payer: Medicare Other | Admitting: Cardiovascular Disease

## 2015-04-15 DIAGNOSIS — E7800 Pure hypercholesterolemia, unspecified: Secondary | ICD-10-CM | POA: Diagnosis not present

## 2015-04-15 DIAGNOSIS — I2510 Atherosclerotic heart disease of native coronary artery without angina pectoris: Secondary | ICD-10-CM | POA: Diagnosis not present

## 2015-04-15 DIAGNOSIS — J309 Allergic rhinitis, unspecified: Secondary | ICD-10-CM | POA: Diagnosis not present

## 2015-04-15 DIAGNOSIS — Z0000 Encounter for general adult medical examination without abnormal findings: Secondary | ICD-10-CM | POA: Diagnosis not present

## 2015-04-18 ENCOUNTER — Ambulatory Visit (INDEPENDENT_AMBULATORY_CARE_PROVIDER_SITE_OTHER): Payer: Medicare Other | Admitting: Cardiovascular Disease

## 2015-04-18 ENCOUNTER — Encounter: Payer: Self-pay | Admitting: Cardiovascular Disease

## 2015-04-18 VITALS — BP 152/62 | HR 60 | Ht 72.0 in | Wt 162.8 lb

## 2015-04-18 DIAGNOSIS — I10 Essential (primary) hypertension: Secondary | ICD-10-CM

## 2015-04-18 DIAGNOSIS — E785 Hyperlipidemia, unspecified: Secondary | ICD-10-CM

## 2015-04-18 DIAGNOSIS — I2510 Atherosclerotic heart disease of native coronary artery without angina pectoris: Secondary | ICD-10-CM | POA: Diagnosis not present

## 2015-04-18 NOTE — Patient Instructions (Signed)

## 2015-04-18 NOTE — Progress Notes (Signed)
Cardiology Office Note Date:  04/18/2015   ID:  [name], DOB 1929-08-03, MRN [id number]  PCP:  Donnie Coffin, MD  Cardiologist:  Sherren Mocha, MD    Chief Complaint  Patient presents with  . Hyperlipidemia    denies any cp, sob, le edema, or claudication  . Hypertension  . Coronary Artery Disease     History of Present Illness: Jesus Harmon is a 82 y.o. male who presents for follow-up of CAD. He is doing well. When I saw him last year he had an episode suggestive of TIA. No abnormalities were seen on CT and an echo was within normal limits. He's had no recurrent symptoms. He has a history of nonobstructive CAD by cath in 2011 and has never undergone PCI. He's active without anginal symptoms. He admits to mild DOE with pushing a wheelbarrow but feels this is normal at his age. Otherwise denies lightheadedness, syncope, edema, or heart palpitations. Feels he's doing fine from cardiac perspective.    Past Medical History  Diagnosis Date  . CAD (coronary artery disease)     a. LHC 10/2009:  pLAD 50%, small D1 70-80% (too small for PCI), EF 65% => med Rx  b. Myoview 10/2009: EF 65%, no ischemia;  c.  ETT-MV 3/14:  No ischemia, EF 63%  . HTN (hypertension)   . HLD (hyperlipidemia)   . Arthritis     back and neck   . Cancer [facility])     skin cancer on ear     Past Surgical History  Procedure Laterality Date  . Knee arthroscopy    . Cataract surgery       bilateral   . Transurethral resection of prostate N/A 03/04/2014    Procedure: TRANSURETHRAL RESECTION OF THE PROSTATE, TRANSURETHRAL RESECTION OF THE BLADDER TUMOR;  Surgeon: Ailene Rud, MD;  Location: [facility];  Service: Urology;  Laterality: N/A;    Current Outpatient Prescriptions  Medication Sig Dispense Refill  . cetirizine (ZYRTEC) 10 MG tablet Take 10 mg by mouth daily.  6  . latanoprost (XALATAN) 0.005 % ophthalmic solution Place 1 drop into both eyes at bedtime.     Marland Kitchen lisinopril (PRINIVIL,ZESTRIL) 10  MG tablet Take 1 tablet (10 mg total) by mouth daily. 90 tablet 0  . NITROSTAT 0.4 MG SL tablet 1 TABLET UNDER TONGUE AT ONSET OF CHEST PAIN YOU MAY REPEAT EVERY 5 MINUTES FOR UP TO 3 DOSES. 25 tablet 1  . simvastatin (ZOCOR) 20 MG tablet TAKE 1 TABLET BY MOUTH EVERY DAY 30 tablet 4   No current facility-administered medications for this visit.    Allergies:   Amoxicillin; Sulfonamide derivatives; and Sulfamethoxazole   Social History:  The patient  reports that he quit smoking about 44 years ago. He has never used smokeless tobacco. He reports that he does not drink alcohol or use illicit drugs.   Family History:  The patient's  family history includes COPD in his father; Heart failure in his father; Sudden death in his mother.    ROS:  Please see the history of present illness.  Otherwise, review of systems is positive for cough, back pain, easy bruising.  All other systems are reviewed and negative.    PHYSICAL EXAM: VS:  BP 152/62 mmHg  Pulse 60  Ht 6' (1.829 m)  Wt 162 lb 12.8 oz (73.846 kg)  BMI 22.07 kg/m2 , BMI Body mass index is 22.07 kg/(m^2). GEN: Well nourished, well developed, pleasant elderly man in no  acute distress HEENT: normal Neck: no JVD, no masses. No carotid bruits Cardiac: RRR without murmur or gallop                Respiratory:  clear to auscultation bilaterally, normal work of breathing GI: soft, nontender, nondistended, + BS MS: no deformity or atrophy Ext: no pretibial edema, pedal pulses 2+= bilaterally Skin: warm and dry, no rash Neuro:  Strength and sensation are intact Psych: euthymic mood, full affect  EKG:  EKG is ordered today. The ekg ordered today shows NSR 60 bpm within normal limits  Recent Labs: No results found for requested labs within last 365 days.   Lipid Panel     Component Value Date/Time   CHOL 153 11/06/2010 1014   TRIG 34.0 11/06/2010 1014   HDL 82.40 11/06/2010 1014   CHOLHDL 2 11/06/2010 1014   VLDL 6.8 11/06/2010 1014    LDLCALC 64 11/06/2010 1014      Wt Readings from Last 3 Encounters:  04/18/15 162 lb 12.8 oz (73.846 kg)  03/04/14 168 lb (76.204 kg)  02/27/14 168 lb (76.204 kg)     Cardiac Studies Reviewed: 2D Echo 02/19/2014: Study Conclusions  - Left ventricle: The cavity size was normal. Systolic function was normal. The estimated ejection fraction was in the range of 55% to 60%. Wall motion was normal; there were no regional wall motion abnormalities. - Aortic valve: Mild focal calcification involving the noncoronary cusp. - Mitral valve: Redundant chordae tendinae off the anterior mitral valve leaflet. There was mild regurgitation. - Atrial septum: There was increased thickness of the septum, consistent with lipomatous hypertrophy. - Tricuspid valve: There was trivial regurgitation. - Pulmonic valve: There was mild regurgitation.  ASSESSMENT AND PLAN: 1.  CAD, native vessel: pt without angina. Reviewed cath report from 2011 and patient had a heavily calcified 50% stenosis of the proximal LAD. A stress perfusion study was done at that time without ischemia demonstrated. Continue with medical Rx in an absence of clinical symptoms. Pt treated with ASA, simvastatin, and lisinopril.  2. HTN: continue lisinopril  3. Hyperlipidemia: treated with simvastatin. Followed by PCP.  Current medicines are reviewed with the patient today.  The patient does not have concerns regarding medicines.  Labs/ tests ordered today include:  No orders of the defined types were placed in this encounter.   Disposition:   FU one year  Signed, Sherren Mocha, MD  04/18/2015 9:37 AM    [facility] [place], [place], [place]  [address] Phone: [phone]; Fax: [phone]

## 2015-04-23 ENCOUNTER — Encounter: Payer: Self-pay | Admitting: Cardiovascular Disease

## 2015-04-28 MED FILL — LATANOPROST 0.005% EYE DRP: 0.005 | 25 days supply | Qty: 3 | Fill #0

## 2015-04-28 MED FILL — SIMVASTATIN 20 MG TABLET: 20 | 30 days supply | Qty: 30 | Fill #0

## 2015-05-06 DIAGNOSIS — D485 Neoplasm of uncertain behavior of skin: Secondary | ICD-10-CM | POA: Diagnosis not present

## 2015-05-06 DIAGNOSIS — L720 Epidermal cyst: Secondary | ICD-10-CM | POA: Diagnosis not present

## 2015-05-06 DIAGNOSIS — C44311 Basal cell carcinoma of skin of nose: Secondary | ICD-10-CM | POA: Diagnosis not present

## 2015-05-06 DIAGNOSIS — D225 Melanocytic nevi of trunk: Secondary | ICD-10-CM | POA: Diagnosis not present

## 2015-05-06 DIAGNOSIS — L821 Other seborrheic keratosis: Secondary | ICD-10-CM | POA: Diagnosis not present

## 2015-05-06 DIAGNOSIS — Z85828 Personal history of other malignant neoplasm of skin: Secondary | ICD-10-CM | POA: Diagnosis not present

## 2015-05-06 DIAGNOSIS — C44519 Basal cell carcinoma of skin of other part of trunk: Secondary | ICD-10-CM | POA: Diagnosis not present

## 2015-05-09 ENCOUNTER — Other Ambulatory Visit: Payer: Self-pay | Admitting: Cardiovascular Disease

## 2015-05-09 MED FILL — LISINOPRIL 10 MG TABLET: 10 | 90 days supply | Qty: 90 | Fill #0

## 2015-06-02 MED FILL — SIMVASTATIN 20 MG TABLET: 20 | 30 days supply | Qty: 30 | Fill #1

## 2015-06-03 DIAGNOSIS — Z85828 Personal history of other malignant neoplasm of skin: Secondary | ICD-10-CM | POA: Diagnosis not present

## 2015-06-03 DIAGNOSIS — C44311 Basal cell carcinoma of skin of nose: Secondary | ICD-10-CM | POA: Diagnosis not present

## 2015-06-03 MED FILL — DOXYCYCLINE HYCLATE 100 MG: 100 | 5 days supply | Qty: 10 | Fill #0

## 2015-06-05 MED FILL — LATANOPROST 0.005% EYE DRP: 0.005 | 25 days supply | Qty: 3 | Fill #1

## 2015-07-03 MED FILL — SIMVASTATIN 20 MG TABLET: 20 | 30 days supply | Qty: 30 | Fill #2

## 2015-07-07 MED FILL — LATANOPROST 0.005% EYE DRP: 0.005 | 25 days supply | Qty: 3 | Fill #2

## 2015-07-17 DIAGNOSIS — H401132 Primary open-angle glaucoma, bilateral, moderate stage: Secondary | ICD-10-CM | POA: Diagnosis not present

## 2015-07-31 MED FILL — SIMVASTATIN 20 MG TABLET: 20 | 30 days supply | Qty: 30 | Fill #3

## 2015-08-05 MED FILL — LISINOPRIL 10 MG TABLET: 10 | 90 days supply | Qty: 90 | Fill #1

## 2015-08-05 MED FILL — LATANOPROST 0.005% EYE DRP: 0.005 | 25 days supply | Qty: 3 | Fill #3

## 2015-09-04 MED FILL — LATANOPROST 0.005% EYE DRP: 0.005 | 25 days supply | Qty: 3 | Fill #0

## 2015-09-04 MED FILL — SIMVASTATIN 20 MG TABLET: 20 | 30 days supply | Qty: 30 | Fill #4

## 2015-09-29 DIAGNOSIS — Z23 Encounter for immunization: Secondary | ICD-10-CM | POA: Diagnosis not present

## 2015-10-07 MED FILL — SIMVASTATIN 20 MG TABLET: 20 | 30 days supply | Qty: 30 | Fill #5

## 2015-10-07 MED FILL — LATANOPROST 0.005% EYE DRP: 0.005 | 25 days supply | Qty: 3 | Fill #1

## 2015-10-09 DIAGNOSIS — J069 Acute upper respiratory infection, unspecified: Secondary | ICD-10-CM | POA: Diagnosis not present

## 2015-10-10 MED FILL — PROMETHAZINE-CODEINE SYRUP: 6.25-10 | 4 days supply | Qty: 120 | Fill #0

## 2015-10-20 DIAGNOSIS — J069 Acute upper respiratory infection, unspecified: Secondary | ICD-10-CM | POA: Diagnosis not present

## 2015-10-20 MED FILL — DOXYCYCLINE MONO 100 MG TAB: 100 | 10 days supply | Qty: 20 | Fill #0

## 2015-11-04 MED FILL — SIMVASTATIN 20 MG TABLET: 20 | 30 days supply | Qty: 30 | Fill #6

## 2015-11-24 MED FILL — LISINOPRIL 10 MG TABLET: 10 | 90 days supply | Qty: 90 | Fill #2

## 2015-11-26 MED FILL — LATANOPROST 0.005% EYE DRP: 0.005 | 25 days supply | Qty: 3 | Fill #2

## 2015-12-08 MED FILL — SIMVASTATIN 20 MG TABLET: 20 | 30 days supply | Qty: 30 | Fill #7

## 2015-12-09 DIAGNOSIS — Z125 Encounter for screening for malignant neoplasm of prostate: Secondary | ICD-10-CM | POA: Diagnosis not present

## 2015-12-11 DIAGNOSIS — Z961 Presence of intraocular lens: Secondary | ICD-10-CM | POA: Diagnosis not present

## 2015-12-11 DIAGNOSIS — H401131 Primary open-angle glaucoma, bilateral, mild stage: Secondary | ICD-10-CM | POA: Diagnosis not present

## 2015-12-16 DIAGNOSIS — N3080 Other cystitis without hematuria: Secondary | ICD-10-CM | POA: Diagnosis not present

## 2015-12-16 DIAGNOSIS — N401 Enlarged prostate with lower urinary tract symptoms: Secondary | ICD-10-CM | POA: Diagnosis not present

## 2015-12-16 DIAGNOSIS — R351 Nocturia: Secondary | ICD-10-CM | POA: Diagnosis not present

## 2015-12-23 MED FILL — LATANOPROST 0.005% EYE DRP: 0.005 | 25 days supply | Qty: 3 | Fill #3

## 2016-01-07 MED FILL — SIMVASTATIN 20 MG TABLET: 20 | 30 days supply | Qty: 30 | Fill #8

## 2016-01-09 DIAGNOSIS — J101 Influenza due to other identified influenza virus with other respiratory manifestations: Secondary | ICD-10-CM | POA: Diagnosis not present

## 2016-01-09 DIAGNOSIS — J069 Acute upper respiratory infection, unspecified: Secondary | ICD-10-CM | POA: Diagnosis not present

## 2016-01-09 MED FILL — OSELTAMIVIR PHOS 75 MG CAP: 75 | 5 days supply | Qty: 10 | Fill #0

## 2016-01-19 DIAGNOSIS — D1801 Hemangioma of skin and subcutaneous tissue: Secondary | ICD-10-CM | POA: Diagnosis not present

## 2016-01-19 DIAGNOSIS — Z85828 Personal history of other malignant neoplasm of skin: Secondary | ICD-10-CM | POA: Diagnosis not present

## 2016-01-19 DIAGNOSIS — D225 Melanocytic nevi of trunk: Secondary | ICD-10-CM | POA: Diagnosis not present

## 2016-01-19 DIAGNOSIS — L570 Actinic keratosis: Secondary | ICD-10-CM | POA: Diagnosis not present

## 2016-01-19 DIAGNOSIS — L821 Other seborrheic keratosis: Secondary | ICD-10-CM | POA: Diagnosis not present

## 2016-01-25 ENCOUNTER — Ambulatory Visit (HOSPITAL_COMMUNITY)
Admission: EM | Admit: 2016-01-25 | Discharge: 2016-01-25 | Disposition: A | Discharge status: Home / Self Care | Payer: Medicare Other | Attending: Emergency Medicine | Admitting: Emergency Medicine

## 2016-01-25 ENCOUNTER — Encounter (HOSPITAL_COMMUNITY): Payer: Self-pay | Admitting: Emergency Medicine

## 2016-01-25 DIAGNOSIS — S41111A Laceration without foreign body of right upper arm, initial encounter: Secondary | ICD-10-CM | POA: Diagnosis not present

## 2016-01-25 MED ORDER — BACITRACIN ZINC 500 UNIT/GM EX OINT
TOPICAL_OINTMENT | CUTANEOUS | Status: AC
  Filled 2016-01-25: qty 1.8

## 2016-01-25 MED ORDER — SILVER NITRATE-POT NITRATE 75-25 % EX MISC
CUTANEOUS | Status: AC
  Filled 2016-01-25: qty 1

## 2016-01-25 NOTE — ED Triage Notes (Signed)
The patient presented to the [facility] with a complaint of a laceration to his right arm secondary to his dog scratching him last night.

## 2016-01-25 NOTE — ED Provider Notes (Signed)
[place]    CSN: [id number] Arrival date & time: 01/25/16  1211     History   Chief Complaint Chief Complaint  Patient presents with  . Laceration    HPI Jesus Harmon is a 83 y.o. male.   HPI He is an 83 year old man here for evaluation of right forearm laceration. He was playing with his dog last night and she scratched his right forearm. He states initially it did not bleed very much. Today it has had persistent bleeding that he has been unable to stop. He states he cleaned it out well yesterday. He is on a baby aspirin, but no other blood thinners.  Past Medical History:  Diagnosis Date  . Arthritis    back and neck   . CAD (coronary artery disease)    a. LHC 10/2009:  pLAD 50%, small D1 70-80% (too small for PCI), EF 65% => med Rx  b. Myoview 10/2009: EF 65%, no ischemia;  c.  ETT-MV 3/14:  No ischemia, EF 63%  . Cancer ([place])    skin cancer on ear   . HLD (hyperlipidemia)   . HTN (hypertension)     Patient Active Problem List   Diagnosis Date Noted  . Benign hypertrophy of prostate 03/04/2014  . HYPERLIPIDEMIA-MIXED 02/17/2010  . HYPERTENSION, UNSPECIFIED 02/17/2010  . CAD, NATIVE VESSEL 11/14/2009  . FATIGUE 11/12/2009  . ARM NUMBNESS 11/12/2009  . DYSPNEA 11/12/2009    Past Surgical History:  Procedure Laterality Date  . cataract surgery      bilateral   . KNEE ARTHROSCOPY    . TRANSURETHRAL RESECTION OF PROSTATE N/A 03/04/2014   Procedure: TRANSURETHRAL RESECTION OF THE PROSTATE, TRANSURETHRAL RESECTION OF THE BLADDER TUMOR;  Surgeon: Ailene Rud, MD;  Location: [facility];  Service: Urology;  Laterality: N/A;       Home Medications    Prior to Admission medications   Medication Sig Start Date End Date Taking? Authorizing Provider  aspirin EC 81 MG tablet Take 81 mg by mouth daily.   Yes Historical Provider, MD  latanoprost (XALATAN) 0.005 % ophthalmic solution Place 1 drop into both eyes at bedtime.    Yes Historical Provider,  MD  lisinopril (PRINIVIL,ZESTRIL) 10 MG tablet TAKE 1 TABLET BY MOUTH DAILY. 05/09/15  Yes Sherren Mocha, MD  simvastatin (ZOCOR) 20 MG tablet TAKE 1 TABLET BY MOUTH EVERY DAY 10/29/14  Yes Sherren Mocha, MD  NITROSTAT 0.4 MG SL tablet 1 TABLET UNDER TONGUE AT ONSET OF CHEST PAIN YOU MAY REPEAT EVERY 5 MINUTES FOR UP TO 3 DOSES. 11/12/14   Sherren Mocha, MD    Family History Family History  Problem Relation Age of Onset  . Sudden death Mother     sudden cardiac arrest  . COPD Father   . Heart failure Father     CHF    Social History Social History  Substance Use Topics  . Smoking status: Former Smoker    Quit date: 01/12/1971  . Smokeless tobacco: Never Used  . Alcohol use No     Allergies   Amoxicillin; Sulfonamide derivatives; and Sulfamethoxazole   Review of Systems Review of Systems As in history of present illness  Physical Exam Triage Vital Signs ED Triage Vitals [01/25/16 1320]  Enc Vitals Group     BP 154/68     Pulse Rate 70     Resp 16     Temp 97.6 F (36.4 C)     Temp Source Oral  SpO2 100 %     Weight      Height      Head Circumference      Peak Flow      Pain Score 2     Pain Loc      Pain Edu?      Excl. in [place]?    No data found.   Updated Vital Signs BP 154/68 (BP Location: Left Arm)   Pulse 70   Temp 97.6 F (36.4 C) (Oral)   Resp 16   SpO2 100%   Visual Acuity Right Eye Distance:   Left Eye Distance:   Bilateral Distance:    Right Eye Near:   Left Eye Near:    Bilateral Near:     Physical Exam  Constitutional: He is oriented to person, place, and time. He appears well-developed and well-nourished. No distress.  Cardiovascular: Normal rate.   Pulmonary/Chest: Effort normal.  Neurological: He is alert and oriented to person, place, and time.  Skin:  10 cm linear laceration to right dorsal forearm. It is almost more of a skin tear. There is one area of persistent bleeding.     UC Treatments / Results  Labs (all  labs ordered are listed, but only abnormal results are displayed) Labs Reviewed - No data to display  EKG  EKG Interpretation None       Radiology No results found.  Procedures Procedures (including critical care time) Bleeding stopped using silver nitrate sticks. Bacitracin and bandage applied.   Medications Ordered in UC Medications - No data to display   Initial Impression / Assessment and Plan / UC Course  I have reviewed the triage vital signs and the nursing notes.  Pertinent labs & imaging results that were available during my care of the patient were reviewed by me and considered in my medical decision making (see chart for details).  Clinical Course     Wound care instructions as in after visit summary. Follow-up as needed.  Final Clinical Impressions(s) / UC Diagnoses   Final diagnoses:  Laceration of right upper extremity, initial encounter    New Prescriptions Discharge Medication List as of 01/25/2016  1:43 PM       Melony Overly, MD 01/25/16 1402

## 2016-01-25 NOTE — Discharge Instructions (Signed)
Leave the bandage on for the next 24 hours. After that, gently wash it with soap and water twice a day. Apply Vaseline, and re-bandage. I would keep it covered for about 4 days. After that, you can leave it open. If you see signs of infection, such as increased redness, swelling, or drainage, please come back.

## 2016-02-09 MED FILL — SIMVASTATIN 20 MG TABLET: 20 | 30 days supply | Qty: 30 | Fill #9

## 2016-02-09 MED FILL — LATANOPROST 0.005% EYE DRP: 0.005 | 25 days supply | Qty: 3 | Fill #0

## 2016-02-23 MED FILL — LISINOPRIL 10 MG TABLET: 10 | 90 days supply | Qty: 90 | Fill #3

## 2016-03-09 MED FILL — SIMVASTATIN 20 MG TABLET: 20 | 30 days supply | Qty: 30 | Fill #10

## 2016-03-15 DIAGNOSIS — H401131 Primary open-angle glaucoma, bilateral, mild stage: Secondary | ICD-10-CM | POA: Diagnosis not present

## 2016-03-29 MED FILL — LATANOPROST 0.005% EYE DRP: 0.005 | 25 days supply | Qty: 3 | Fill #1

## 2016-04-09 MED FILL — SIMVASTATIN 20 MG TABLET: 20 | 30 days supply | Qty: 30 | Fill #11

## 2016-04-21 ENCOUNTER — Encounter: Payer: Self-pay | Admitting: Cardiovascular Disease

## 2016-05-10 ENCOUNTER — Ambulatory Visit (INDEPENDENT_AMBULATORY_CARE_PROVIDER_SITE_OTHER): Payer: Medicare Other | Admitting: Cardiovascular Disease

## 2016-05-10 VITALS — BP 130/60 | HR 56 | Ht 72.0 in | Wt 156.5 lb

## 2016-05-10 DIAGNOSIS — R0602 Shortness of breath: Secondary | ICD-10-CM

## 2016-05-10 DIAGNOSIS — I25119 Atherosclerotic heart disease of native coronary artery with unspecified angina pectoris: Secondary | ICD-10-CM | POA: Diagnosis not present

## 2016-05-10 MED ORDER — SIMVASTATIN 20 MG PO TABS: 20.0000 mg | ORAL_TABLET | Freq: Every day | ORAL | 3 refills | Status: DC

## 2016-05-10 MED FILL — LATANOPROST 0.005% EYE DRP: 0.005 | 25 days supply | Qty: 3 | Fill #2

## 2016-05-10 MED FILL — SIMVASTATIN 20 MG TABLET: 20 | 90 days supply | Qty: 90 | Fill #0

## 2016-05-10 NOTE — Progress Notes (Signed)
Cardiology Office Note Date:  05/12/2016   ID:  [name], DOB September 11, 1929, MRN [id number]  PCP:  Donnie Coffin, MD  Cardiologist:  Sherren Mocha, MD    Chief Complaint  Patient presents with  . routine office visit    hyperlipidemia     History of Present Illness: Jesus Harmon is a 83 y.o. male who presents for follow-up of CAD. He is doing well. When he was seen in 2016 he had an episode suggestive of TIA. No abnormalities were seen on CT and an echo was within normal limits. He's had no recurrent symptoms. He has a history of nonobstructive CAD by cath in 2011 and has never undergone PCI.   He's here with his wife and daughter today. He reports some problems with gait unsteadiness and occasional lightheadedness. Also admits to shortness of breath with physical activity. He's had a few episodes of chest pressure in the center of the chest. 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 No clear orthopnea, PND, heart palpitations, or leg swelling.  Past Medical History:  Diagnosis Date  . Arthritis    back and neck   . CAD (coronary artery disease)    a. LHC 10/2009:  pLAD 50%, small D1 70-80% (too small for PCI), EF 65% => med Rx  b. Myoview 10/2009: EF 65%, no ischemia;  c.  ETT-MV 3/14:  No ischemia, EF 63%  . Cancer ([place])    skin cancer on ear   . HLD (hyperlipidemia)   . HTN (hypertension)     Past Surgical History:  Procedure Laterality Date  . cataract surgery      bilateral   . KNEE ARTHROSCOPY    . TRANSURETHRAL RESECTION OF PROSTATE N/A 03/04/2014   Procedure: TRANSURETHRAL RESECTION OF THE PROSTATE, TRANSURETHRAL RESECTION OF THE BLADDER TUMOR;  Surgeon: Ailene Rud, MD;  Location: [facility];  Service: Urology;  Laterality: N/A;    Current Outpatient Prescriptions  Medication Sig Dispense Refill  . aspirin EC 81 MG tablet Take 81 mg by mouth daily.    Marland Kitchen latanoprost (XALATAN) 0.005 % ophthalmic solution Place 1 drop into both  eyes at bedtime.     Marland Kitchen lisinopril (PRINIVIL,ZESTRIL) 10 MG tablet TAKE 1 TABLET BY MOUTH DAILY. 90 tablet 3  . loratadine (CLARITIN) 10 MG tablet Take 10 mg by mouth daily.    Marland Kitchen NITROSTAT 0.4 MG SL tablet 1 TABLET UNDER TONGUE AT ONSET OF CHEST PAIN YOU MAY REPEAT EVERY 5 MINUTES FOR UP TO 3 DOSES. 25 tablet 1  . simvastatin (ZOCOR) 20 MG tablet Take 1 tablet (20 mg total) by mouth daily. 90 tablet 3   No current facility-administered medications for this visit.     Allergies:   Amoxicillin; Sulfonamide derivatives; and Sulfamethoxazole   Social History:  The patient  reports that he quit smoking about 45 years ago. He has never used smokeless tobacco. He reports that he does not drink alcohol or use drugs.   Family History:  The patient's  family history includes COPD in his father; Heart failure in his father; Sudden death in his mother.    ROS:  Please see the history of present illness.  Otherwise, review of systems is positive for gait unsteadiness.  All other systems are reviewed and negative.   PHYSICAL EXAM: VS:  BP 130/60   Pulse (!) 56   Ht 6' (1.829 m)   Wt 156 lb 8 oz (71 kg)  BMI 21.23 kg/m  , BMI Body mass index is 21.23 kg/m. GEN: Well nourished, well developed, in no acute distress  HEENT: normal  Neck: no JVD, no masses. No carotid bruits Cardiac: RRR with 2/6 SEM at the LLSB              Respiratory:  clear to auscultation bilaterally, normal work of breathing GI: soft, nontender, nondistended, + BS MS: no deformity or atrophy  Ext: no pretibial edema, pedal pulses 2+= bilaterally Skin: warm and dry, no rash Neuro:  Strength and sensation are intact Psych: euthymic mood, full affect  EKG:  EKG is ordered today. The ekg ordered today shows sinus bradycardia 56 bpm, otherwise within normal limits  Recent Labs: No results found for requested labs within last 8760 hours.   Lipid Panel     Component Value Date/Time   CHOL 153 11/06/2010 1014   TRIG 34.0  11/06/2010 1014   HDL 82.40 11/06/2010 1014   CHOLHDL 2 11/06/2010 1014   VLDL 6.8 11/06/2010 1014   LDLCALC 64 11/06/2010 1014      Wt Readings from Last 3 Encounters:  05/10/16 156 lb 8 oz (71 kg)  04/18/15 162 lb 12.8 oz (73.8 kg)  03/04/14 168 lb (76.2 kg)     Cardiac Studies Reviewed: 2D Echo 02/19/2014: Study Conclusions  - Left ventricle: The cavity size was normal. Systolic function was normal. The estimated ejection fraction was in the range of 55% to 60%. Wall motion was normal; there were no regional wall motion abnormalities. - Aortic valve: Mild focal calcification involving the noncoronary cusp. - Mitral valve: Redundant chordae tendinae off the anterior mitral valve leaflet. There was mild regurgitation. - Atrial septum: There was increased thickness of the septum, consistent with lipomatous hypertrophy. - Tricuspid valve: There was trivial regurgitation. - Pulmonic valve: There was mild regurgitation.  Nuclear Stress Test 03/15/2012: QPS Raw Data Images:  Normal; no motion artifact; normal heart/lung ratio. Stress Images:  Normal homogeneous uptake in all areas of the myocardium. Rest Images:  Normal homogeneous uptake in all areas of the myocardium. Subtraction (SDS):  There is no evidence of scar or ischemia. Transient Ischemic Dilatation (Normal <1.22):  0.99 Lung/Heart Ratio (Normal <0.45):  0.20  Quantitative Gated Spect Images QGS EDV:  107 ml QGS ESV:  39 ml  Impression Exercise Capacity:  Fair exercise capacity. BP Response:  Hypertensive blood pressure response. Clinical Symptoms:  Shortness of breath.  ECG Impression:  NSR with RBBB, no change with exercise.  Comparison with Prior Nuclear Study: No significant change from previous study  Overall Impression:  Normal stress nuclear study except hypertensive BP response.   LV Ejection Fraction: 63%.  LV Wall Motion:  NL LV Function; NL Wall Motion  ASSESSMENT AND PLAN: 1.   CAD, native vessel, with angina: not clear that chest pressure is consistently occurring with exertion. However he does have a component of shortness of breath. I have recommended an exercise Myoview stress scan to evaluate for ischemia. I have also recommended an echocardiogram to assess his shortness of breath. He has known eccentric calcific 50% proximal LAD stenosis from cardiac catheterization in 2011. I reviewed his cath study today.  2. HTN: treated with lisinopril. Advised to drink more fluids in the context of dizziness. Medical program is reviewed and will be continued without change.   3. Hyperlipidemia: treated with simvastatin. Lipids followed by Dr Alroy Dust.  Current medicines are reviewed with the patient today.  The patient does not have  concerns regarding medicines.  Labs/ tests ordered today include:   Orders Placed This Encounter  Procedures  . Myocardial Perfusion Imaging  . EKG 12-Lead  . ECHOCARDIOGRAM COMPLETE   Disposition:   FU one year  Signed, Sherren Mocha, MD  05/12/2016 10:28 PM    [facility] [address], [place], [place]  [address] Phone: [phone]; Fax: [phone]

## 2016-05-10 NOTE — Patient Instructions (Signed)
Medication Instructions:  Your physician recommends that you continue on your current medications as directed. Please refer to the Current Medication list given to you today.  Labwork: No new orders.   Testing/Procedures: Your physician has requested that you have an echocardiogram. Echocardiography is a painless test that uses sound waves to create images of your heart. It provides your doctor with information about the size and shape of your heart and how well your heart's chambers and valves are working. This procedure takes approximately one hour. There are no restrictions for this procedure.  Your physician has requested that you have an exercise stress myoview. For further information please visit [email or web address]. Please follow instruction sheet, as given.  Follow-Up: Your physician wants you to follow-up in: 1 YEAR with Dr Burt Knack.  You will receive a reminder letter in the mail two months in advance. If you don't receive a letter, please call our office to schedule the follow-up appointment.  Any Other Special Instructions Will Be Listed Below (If Applicable).     If you need a refill on your cardiac medications before your next appointment, please call your pharmacy.

## 2016-05-12 ENCOUNTER — Encounter: Payer: Self-pay | Admitting: Cardiovascular Disease

## 2016-05-18 ENCOUNTER — Telehealth (HOSPITAL_COMMUNITY): Payer: Self-pay | Admitting: *Deleted

## 2016-05-18 NOTE — Telephone Encounter (Signed)
Error

## 2016-05-18 NOTE — Telephone Encounter (Signed)
Patient given detailed instructions per Myocardial Perfusion Study Information Sheet for the test on 05/20/16. Patient notified to arrive 15 minutes early and that it is imperative to arrive on time for appointment to keep from having the test rescheduled.  If you need to cancel or reschedule your appointment, please call the office within 24 hours of your appointment. Failure to do so may result in a cancellation of your appointment, and a $50 no show fee. Patient verbalized understanding.Kirstie Peri

## 2016-05-18 NOTE — Telephone Encounter (Signed)
Left message on voicemail in reference to upcoming appointment scheduled for 05/20/16. Phone number given for a call back so details instructions can be given. Jesus Harmon

## 2016-05-20 ENCOUNTER — Ambulatory Visit (HOSPITAL_BASED_OUTPATIENT_CLINIC_OR_DEPARTMENT_OTHER): Payer: Medicare Other

## 2016-05-20 ENCOUNTER — Ambulatory Visit (HOSPITAL_COMMUNITY): Payer: Medicare Other | Attending: Internal Medicine

## 2016-05-20 DIAGNOSIS — R0609 Other forms of dyspnea: Secondary | ICD-10-CM | POA: Diagnosis not present

## 2016-05-20 DIAGNOSIS — R0602 Shortness of breath: Secondary | ICD-10-CM

## 2016-05-20 DIAGNOSIS — I4510 Unspecified right bundle-branch block: Secondary | ICD-10-CM | POA: Diagnosis not present

## 2016-05-20 DIAGNOSIS — I119 Hypertensive heart disease without heart failure: Secondary | ICD-10-CM | POA: Insufficient documentation

## 2016-05-20 DIAGNOSIS — I25119 Atherosclerotic heart disease of native coronary artery with unspecified angina pectoris: Secondary | ICD-10-CM | POA: Diagnosis not present

## 2016-05-20 DIAGNOSIS — I358 Other nonrheumatic aortic valve disorders: Secondary | ICD-10-CM | POA: Diagnosis not present

## 2016-05-20 DIAGNOSIS — R9439 Abnormal result of other cardiovascular function study: Secondary | ICD-10-CM | POA: Diagnosis not present

## 2016-05-20 LAB — MYOCARDIAL PERFUSION IMAGING
CHL CUP NUCLEAR SDS: 0
CSEPED: 4 min
Estimated workload: 4.6 METS
Exercise duration (sec): 0 s
LV sys vol: 49 mL
LVDIAVOL: 122 mL (ref 62–150)
MPHR: 136 {beats}/min
NUC STRESS TID: 0.92
Peak HR: 125 {beats}/min
Percent HR: 91 %
RATE: 0.25
Rest HR: 66 {beats}/min
SRS: 5
SSS: 5

## 2016-05-20 MED ORDER — TECHNETIUM TC 99M TETROFOSMIN IV KIT
32.8000 | PACK | Freq: Once | INTRAVENOUS | Status: AC | PRN
  Administered 2016-05-20: 32.8 via INTRAVENOUS
  Filled 2016-05-20: qty 33

## 2016-05-20 MED ORDER — TECHNETIUM TC 99M TETROFOSMIN IV KIT
10.6000 | PACK | Freq: Once | INTRAVENOUS | Status: AC | PRN
  Administered 2016-05-20: 10.6 via INTRAVENOUS
  Filled 2016-05-20: qty 11

## 2016-05-25 ENCOUNTER — Other Ambulatory Visit: Payer: Self-pay | Admitting: Cardiovascular Disease

## 2016-05-25 MED FILL — LISINOPRIL 10 MG TABLET: 10 | 90 days supply | Qty: 90 | Fill #0

## 2016-05-27 ENCOUNTER — Telehealth: Payer: Self-pay | Admitting: Cardiovascular Disease

## 2016-05-27 NOTE — Telephone Encounter (Signed)
I spoke with the pt and made him aware of echo and myoview results.

## 2016-05-27 NOTE — Telephone Encounter (Signed)
New message    Pt states he is calling for his testing results and requests a call back

## 2016-05-28 DIAGNOSIS — Z0000 Encounter for general adult medical examination without abnormal findings: Secondary | ICD-10-CM | POA: Diagnosis not present

## 2016-05-28 DIAGNOSIS — E7800 Pure hypercholesterolemia, unspecified: Secondary | ICD-10-CM | POA: Diagnosis not present

## 2016-05-28 DIAGNOSIS — I2510 Atherosclerotic heart disease of native coronary artery without angina pectoris: Secondary | ICD-10-CM | POA: Diagnosis not present

## 2016-05-28 DIAGNOSIS — J309 Allergic rhinitis, unspecified: Secondary | ICD-10-CM | POA: Diagnosis not present

## 2016-06-11 ENCOUNTER — Other Ambulatory Visit: Payer: Self-pay | Admitting: Cardiovascular Disease

## 2016-06-14 ENCOUNTER — Encounter: Payer: Self-pay | Admitting: Cardiovascular Disease

## 2016-06-14 ENCOUNTER — Ambulatory Visit (INDEPENDENT_AMBULATORY_CARE_PROVIDER_SITE_OTHER): Payer: Medicare Other | Admitting: Cardiovascular Disease

## 2016-06-14 ENCOUNTER — Encounter (INDEPENDENT_AMBULATORY_CARE_PROVIDER_SITE_OTHER): Payer: Self-pay

## 2016-06-14 VITALS — BP 150/60 | HR 64 | Ht 72.0 in | Wt 155.4 lb

## 2016-06-14 DIAGNOSIS — I208 Other forms of angina pectoris: Secondary | ICD-10-CM

## 2016-06-14 NOTE — Progress Notes (Signed)
Cardiology Office Note Date:  06/14/2016   ID:  Jesus Harmon, DOB 04-Oct-1929, MRN [id number]  PCP:  Aurea Graff.Marlou Sa, MD  Cardiologist:  Sherren Mocha, MD    Chief Complaint  Patient presents with  . Follow-up     History of Present Illness: Jesus Harmon is a 83 y.o. male who presents for evaluation of chest pain. The patient underwent cardiac catheterization in 2011 and was noted to have 50% calcific stenosis of the proximal LAD. Medical management was felt to be appropriate. He was just recently evaluated 05/10/2016 and was noted to have shortness of breath with exertion and also a few episodes of chest discomfort. A nuclear stress test was performed and this demonstrated no evidence of ischemia. An echocardiogram showed normal LV function with no significant valvular disease. Ongoing medical therapy was recommended.  This past Friday he was out working in the yard and he developed pressure in the center of his chest. He stopped working and sat down to rest. Chest discomfort persisted and he took a total of 2 NTG. Pain improved after the first NTG and resolved completely after the second NTG. The total duration of the episode was approximately 20 minutes. He also reports recurrent episodes of shortness of breath with activity over the past week which has not been a symptom he's experienced in the past. The patient has had no resting symptoms. He is here with his wife and daughter today to discuss whether any further evaluation is indicated.  Past Medical History:  Diagnosis Date  . Arthritis    back and neck   . CAD (coronary artery disease)    a. LHC 10/2009:  pLAD 50%, small D1 70-80% (too small for PCI), EF 65% => med Rx  b. Myoview 10/2009: EF 65%, no ischemia;  c.  ETT-MV 3/14:  No ischemia, EF 63%  . Cancer ([place])    skin cancer on ear   . HLD (hyperlipidemia)   . HTN (hypertension)     Past Surgical History:  Procedure Laterality Date  . cataract surgery      bilateral   . KNEE ARTHROSCOPY    . TRANSURETHRAL RESECTION OF PROSTATE N/A 03/04/2014   Procedure: TRANSURETHRAL RESECTION OF THE PROSTATE, TRANSURETHRAL RESECTION OF THE BLADDER TUMOR;  Surgeon: Ailene Rud, MD;  Location: [facility];  Service: Urology;  Laterality: N/A;    Current Outpatient Prescriptions  Medication Sig Dispense Refill  . aspirin EC 81 MG tablet Take 81 mg by mouth daily.    Marland Kitchen latanoprost (XALATAN) 0.005 % ophthalmic solution Place 1 drop into both eyes at bedtime.     Marland Kitchen lisinopril (PRINIVIL,ZESTRIL) 10 MG tablet Take 1 tablet (10 mg total) by mouth daily. 90 tablet 3  . loratadine (CLARITIN) 10 MG tablet Take 10 mg by mouth daily.    . nitroGLYCERIN (NITROSTAT) 0.4 MG SL tablet Place 1 tablet (0.4 mg total) under the tongue every 5 (five) minutes as needed for chest pain. 25 tablet 5  . simvastatin (ZOCOR) 20 MG tablet Take 1 tablet (20 mg total) by mouth daily. 90 tablet 3   No current facility-administered medications for this visit.     Allergies:   Amoxicillin; Sulfonamide derivatives; and Sulfamethoxazole   Social History:  The patient  reports that he quit smoking about 45 years ago. He has never used smokeless tobacco. He reports that he does not drink alcohol or use drugs.   Family History:  The patient's  family history includes COPD  in his father; Heart failure in his father; Sudden death in his mother.    ROS:  Please see the history of present illness.  Otherwise, review of systems is positive for Fatigue.  All other systems are reviewed and negative.    PHYSICAL EXAM: VS:  BP (!) 150/60   Pulse 64   Ht 6' (1.829 m)   Wt 155 lb 6.4 oz (70.5 kg)   BMI 21.08 kg/m  , BMI Body mass index is 21.08 kg/m. GEN: Well nourished, well developed, pleasant elderly man in no acute distress  HEENT: normal  Neck: no JVD, no masses. No carotid bruits Cardiac: RRR without murmur or gallop                Respiratory:  clear to auscultation bilaterally,  normal work of breathing GI: soft, nontender, nondistended, + BS MS: no deformity or atrophy  Ext: no pretibial edema, pedal pulses 2+= bilaterally Skin: warm and dry, no rash Neuro:  Strength and sensation are intact Psych: euthymic mood, full affect  EKG:  EKG is ordered today. The ekg ordered today shows NSR 64 bpm, within normal limits  Recent Labs: No results found for requested labs within last 8760 hours.   Lipid Panel     Component Value Date/Time   CHOL 153 11/06/2010 1014   TRIG 34.0 11/06/2010 1014   HDL 82.40 11/06/2010 1014   CHOLHDL 2 11/06/2010 1014   VLDL 6.8 11/06/2010 1014   LDLCALC 64 11/06/2010 1014    Wt Readings from Last 3 Encounters:  06/14/16 155 lb 6.4 oz (70.5 kg)  05/20/16 156 lb (70.8 kg)  05/10/16 156 lb 8 oz (71 kg)    Cardiac Studies Reviewed: Myoview stress test 05-20-2016: Study Highlights     Nuclear stress EF: 60%. No wall motion abnormalities noted.  There was no ST segment deviation noted during stress.  Defect 1: There is a small defect of moderate severity present in the basal inferoseptal location.  The left ventricular ejection fraction is normal (55-65%).  This is a low risk study There is no ischemia in the LAD distribution or other territories.   Candee Furbish, MD   Echo 05-20-2016: Study Conclusions  - Left ventricle: The cavity size was normal. There was mild focal   basal hypertrophy of the septum. Systolic function was normal.   Wall motion was normal; there were no regional wall motion   abnormalities. Left ventricular diastolic function parameters   were normal. - Aortic valve: Trileaflet. Moderate focal thickening and   calcification involving the noncoronary cusp. - Mitral valve: There is a thin very mobile bright target off the   MV that likely represents redundant chordae tendinae. Less likely   to be small ruptured chord with only trivial MR present. There   was trivial regurgitation. - Atrial septum:  There was increased thickness of the septum,   consistent with lipomatous hypertrophy. - Pulmonic valve: There was trivial regurgitation.  ASSESSMENT AND PLAN: CAD, native vessel, with angina: The patient has developed progressive angina and shortness of breath of recent onset. While his stress Myoview scan was low risk, I am concerned about his history of known LAD stenosis and new onset exertional angina requiring nitroglycerin. I recommended definitive evaluation with cardiac catheterization and possible PCI. The patient has developed CCS functional class III symptoms. I have reviewed his recent echo and nuclear stress test results. I have reviewed the risks, indications, and alternatives to cardiac catheterization, possible angioplasty, and stenting with  the patient. Risks include but are not limited to bleeding, infection, vascular injury, stroke, myocardial infection, arrhythmia, kidney injury, radiation-related injury in the case of prolonged fluoroscopy use, emergency cardiac surgery, and death. The patient understands the risks of serious complication is 1-2 in [id number] with diagnostic cardiac cath and 1-2% or less with angioplasty/stenting.  He will continue on ASA 81 mg until cath.  Current medicines are reviewed with the patient today.  The patient does not have concerns regarding medicines.  Labs/ tests ordered today include:  No orders of the defined types were placed in this encounter.  Disposition:   Cardiac cath as outlined above  Signed, Sherren Mocha, MD  06/14/2016 11:09 AM    [facility] [place], [place], [place]  [address] Phone: [phone]; Fax: [phone]

## 2016-06-14 NOTE — Patient Instructions (Addendum)
Medication Instructions:  Your physician recommends that you continue on your current medications as directed. Please refer to the Current Medication list given to you today.  Labwork: Your physician recommends that you have lab work today: BMP, CBC and PT/INR  Testing/Procedures: Your physician has requested that you have a cardiac catheterization. Cardiac catheterization is used to diagnose and/or treat various heart conditions. Doctors may recommend this procedure for a number of different reasons. The most common reason is to evaluate chest pain. Chest pain can be a symptom of coronary artery disease (CAD), and cardiac catheterization can show whether plaque is narrowing or blocking your heart's arteries. This procedure is also used to evaluate the valves, as well as measure the blood flow and oxygen levels in different parts of your heart. For further information please visit [email or web address]. Please follow instruction sheet, as given.    [place] OFFICE [address] [place] [address] Dept: [phone] Loc: [phone]  Jesus Harmon  06/14/2016  You are scheduled for a Cardiac Catheterization on Wednesday, June 6 with Dr. Sherren Mocha.  1. Please arrive at the [facility] (Main Entrance A) at [facility]: [address] [place], [place] [address] at 11:00 AM (two hours before your procedure to ensure your preparation). Free valet parking service is available.   Special note: Every effort is made to have your procedure done on time. Please understand that emergencies sometimes delay scheduled procedures.  2. Diet: Do not eat or drink anything after midnight prior to your procedure except sips of water to take medications.  3. Labs: Labs will be drawn in the office today.   4. Medication instructions in preparation for your procedure:  On the morning of your procedure,  take your Aspirin and morning medicines.  You may use sips of water.  5. Plan for one night stay--bring personal belongings.  6. Bring a current list of your medications and current insurance cards.  7. You MUST have a responsible person to drive you home.  8. Someone MUST be with you the first 24 hours after you arrive home or your discharge will be delayed.  9. Please wear clothes that are easy to get on and off and wear slip-on shoes.  Thank you for allowing [place] to care for you!   -- [facility]   Follow-Up: We will arrange further follow-up after cardiac catheterization.   Any Other Special Instructions Will Be Listed Below (If Applicable).     If you need a refill on your cardiac medications before your next appointment, please call your pharmacy.

## 2016-06-15 ENCOUNTER — Telehealth: Payer: Self-pay

## 2016-06-15 LAB — BASIC METABOLIC PANEL
BUN/Creatinine Ratio: 18 (ref 10–24)
BUN: 19 mg/dL (ref 8–27)
CHLORIDE: 100 mmol/L (ref 96–106)
CO2: 25 mmol/L (ref 18–29)
Calcium: 9.8 mg/dL (ref 8.6–10.2)
Creatinine, Ser: 1.08 mg/dL (ref 0.76–1.27)
GFR calc Af Amer: 72 mL/min/{1.73_m2} (ref >59)
GFR calc non Af Amer: 63 mL/min/{1.73_m2} (ref >59)
GLUCOSE: 92 mg/dL (ref 65–99)
POTASSIUM: 5.3 mmol/L — AB (ref 3.5–5.2)
Sodium: 139 mmol/L (ref 134–144)

## 2016-06-15 LAB — CBC
HEMOGLOBIN: 11.9 g/dL — AB (ref 13.0–17.7)
Hematocrit: 37.2 % — ABNORMAL LOW (ref 37.5–51.0)
MCH: 29 pg (ref 26.6–33.0)
MCHC: 32 g/dL (ref 31.5–35.7)
MCV: 91 fL (ref 79–97)
Platelets: 304 10*3/uL (ref 150–379)
RBC: 4.1 x10E6/uL — ABNORMAL LOW (ref 4.14–5.80)
RDW: 13.6 % (ref 12.3–15.4)
WBC: 6.1 10*3/uL (ref 3.4–10.8)

## 2016-06-15 LAB — PROTIME-INR
INR: 0.9 (ref 0.8–1.2)
Prothrombin Time: 10.1 s (ref 9.1–12.0)

## 2016-06-15 NOTE — Telephone Encounter (Signed)
Patient returned call to confirm catheterization at [facility] scheduled for:  06/16/2016 @ 1300 Verified arrival time and place:  Confirmed arrival to [facility] @ 1100 Confirmed AM meds to be taken pre-cath with sip of water:  Pt states he will normally takes his ASA at night, but nurse had instructed him to also take another dose AM before procedure.   Confirmed patient has responsible person to drive home post procedure and observe patient for 24 hours: Pt states wife and daughters will be with him post cath and for 24 hours. Addl concerns:  None noted.  Instructed to call back if any further questions.

## 2016-06-15 NOTE — Telephone Encounter (Signed)
Call placed to Pt to confirm pre cath instruction.  Call went to VM.  Left generic message for call back to this nurse with direct contact #.  Await return call.

## 2016-06-16 ENCOUNTER — Ambulatory Visit (HOSPITAL_COMMUNITY)
Admission: RE | Admit: 2016-06-16 | Discharge: 2016-06-17 | Disposition: A | Discharge status: Home / Self Care | Payer: Medicare Other | Source: Ambulatory Visit | Attending: Cardiovascular Disease | Admitting: Cardiovascular Disease

## 2016-06-16 ENCOUNTER — Encounter (HOSPITAL_COMMUNITY)
Admission: RE | Disposition: A | Discharge status: Home / Self Care | Payer: Self-pay | Source: Ambulatory Visit | Attending: Cardiovascular Disease

## 2016-06-16 ENCOUNTER — Encounter (HOSPITAL_COMMUNITY): Payer: Self-pay | Admitting: General Practice

## 2016-06-16 DIAGNOSIS — Z880 Allergy status to penicillin: Secondary | ICD-10-CM | POA: Insufficient documentation

## 2016-06-16 DIAGNOSIS — I25118 Atherosclerotic heart disease of native coronary artery with other forms of angina pectoris: Secondary | ICD-10-CM

## 2016-06-16 DIAGNOSIS — M47812 Spondylosis without myelopathy or radiculopathy, cervical region: Secondary | ICD-10-CM | POA: Insufficient documentation

## 2016-06-16 DIAGNOSIS — Z882 Allergy status to sulfonamides status: Secondary | ICD-10-CM | POA: Diagnosis not present

## 2016-06-16 DIAGNOSIS — I25110 Atherosclerotic heart disease of native coronary artery with unstable angina pectoris: Secondary | ICD-10-CM | POA: Diagnosis not present

## 2016-06-16 DIAGNOSIS — Z7902 Long term (current) use of antithrombotics/antiplatelets: Secondary | ICD-10-CM | POA: Insufficient documentation

## 2016-06-16 DIAGNOSIS — I10 Essential (primary) hypertension: Secondary | ICD-10-CM | POA: Insufficient documentation

## 2016-06-16 DIAGNOSIS — Z8249 Family history of ischemic heart disease and other diseases of the circulatory system: Secondary | ICD-10-CM | POA: Diagnosis not present

## 2016-06-16 DIAGNOSIS — Z85828 Personal history of other malignant neoplasm of skin: Secondary | ICD-10-CM | POA: Diagnosis not present

## 2016-06-16 DIAGNOSIS — Z79899 Other long term (current) drug therapy: Secondary | ICD-10-CM | POA: Diagnosis not present

## 2016-06-16 DIAGNOSIS — I25119 Atherosclerotic heart disease of native coronary artery with unspecified angina pectoris: Secondary | ICD-10-CM | POA: Diagnosis not present

## 2016-06-16 DIAGNOSIS — E785 Hyperlipidemia, unspecified: Secondary | ICD-10-CM | POA: Diagnosis not present

## 2016-06-16 DIAGNOSIS — Z7982 Long term (current) use of aspirin: Secondary | ICD-10-CM | POA: Diagnosis not present

## 2016-06-16 DIAGNOSIS — Z955 Presence of coronary angioplasty implant and graft: Secondary | ICD-10-CM

## 2016-06-16 DIAGNOSIS — Z87891 Personal history of nicotine dependence: Secondary | ICD-10-CM | POA: Diagnosis not present

## 2016-06-16 DIAGNOSIS — I2510 Atherosclerotic heart disease of native coronary artery without angina pectoris: Secondary | ICD-10-CM

## 2016-06-16 DIAGNOSIS — Z825 Family history of asthma and other chronic lower respiratory diseases: Secondary | ICD-10-CM | POA: Insufficient documentation

## 2016-06-16 HISTORY — PX: INTRAVASCULAR PRESSURE WIRE/FFR STUDY: CATH118243

## 2016-06-16 HISTORY — PX: CORONARY STENT INTERVENTION: CATH118234

## 2016-06-16 HISTORY — DX: Migraine, unspecified, not intractable, without status migrainosus: G43.909

## 2016-06-16 HISTORY — PX: CORONARY BALLOON ANGIOPLASTY: CATH118233

## 2016-06-16 HISTORY — DX: Personal history of urinary calculi: Z87.442

## 2016-06-16 HISTORY — PX: LEFT HEART CATH AND CORONARY ANGIOGRAPHY: CATH118249

## 2016-06-16 HISTORY — PX: CORONARY ANGIOPLASTY WITH STENT PLACEMENT: SHX49

## 2016-06-16 LAB — POCT ACTIVATED CLOTTING TIME
ACTIVATED CLOTTING TIME: 180 s
ACTIVATED CLOTTING TIME: 224 s
ACTIVATED CLOTTING TIME: 246 s
Activated Clotting Time: 263 s
Activated Clotting Time: 263 seconds
Activated Clotting Time: 252 s

## 2016-06-16 SURGERY — LEFT HEART CATH AND CORONARY ANGIOGRAPHY
Anesthesia: LOCAL

## 2016-06-16 MED ORDER — MIDAZOLAM HCL 2 MG/2ML IJ SOLN
INTRAMUSCULAR | Status: AC
  Filled 2016-06-16: qty 2

## 2016-06-16 MED ORDER — LORATADINE 10 MG PO TABS
10.0000 mg | ORAL_TABLET | Freq: Every day | ORAL | Status: DC
  Administered 2016-06-17: 10:00:00 10 mg via ORAL
  Filled 2016-06-16: qty 1

## 2016-06-16 MED ORDER — NITROGLYCERIN 1 MG/10 ML FOR IR/CATH LAB
INTRA_ARTERIAL | Status: AC
  Filled 2016-06-16: qty 10

## 2016-06-16 MED ORDER — VERAPAMIL HCL 2.5 MG/ML IV SOLN
INTRAVENOUS | Status: DC | PRN
  Administered 2016-06-16: 10 mL via INTRA_ARTERIAL

## 2016-06-16 MED ORDER — ONDANSETRON HCL 4 MG/2ML IJ SOLN: 4.0000 mg | Freq: Four times a day (QID) | INTRAMUSCULAR | Status: DC | PRN

## 2016-06-16 MED ORDER — ASPIRIN 81 MG PO CHEW: 81.0000 mg | CHEWABLE_TABLET | ORAL | Status: DC

## 2016-06-16 MED ORDER — FENTANYL CITRATE (PF) 100 MCG/2ML IJ SOLN
INTRAMUSCULAR | Status: AC
  Filled 2016-06-16: qty 2

## 2016-06-16 MED ORDER — HEPARIN SODIUM (PORCINE) 1000 UNIT/ML IJ SOLN
INTRAMUSCULAR | Status: AC
  Filled 2016-06-16: qty 1

## 2016-06-16 MED ORDER — HEPARIN (PORCINE) IN NACL 2-0.9 UNIT/ML-% IJ SOLN
INTRAMUSCULAR | Status: AC
  Filled 2016-06-16: qty 1000

## 2016-06-16 MED ORDER — IOPAMIDOL (ISOVUE-370) INJECTION 76%
INTRAVENOUS | Status: DC | PRN
  Administered 2016-06-16: 150 mL via INTRA_ARTERIAL

## 2016-06-16 MED ORDER — SODIUM CHLORIDE 0.9 % WEIGHT BASED INFUSION: 1.0000 mL/kg/h | INTRAVENOUS | Status: DC

## 2016-06-16 MED ORDER — IOPAMIDOL (ISOVUE-370) INJECTION 76%
INTRAVENOUS | Status: AC
  Filled 2016-06-16: qty 50

## 2016-06-16 MED ORDER — NITROGLYCERIN 0.4 MG SL SUBL: 0.4000 mg | SUBLINGUAL_TABLET | SUBLINGUAL | Status: DC | PRN

## 2016-06-16 MED ORDER — SODIUM CHLORIDE 0.9 % IV SOLN: 250.0000 mL | INTRAVENOUS | Status: DC | PRN

## 2016-06-16 MED ORDER — NITROGLYCERIN 1 MG/10 ML FOR IR/CATH LAB
INTRA_ARTERIAL | Status: DC | PRN
  Administered 2016-06-16: 100 ug via INTRACORONARY
  Administered 2016-06-16: 150 ug via INTRACORONARY

## 2016-06-16 MED ORDER — HYDRALAZINE HCL 20 MG/ML IJ SOLN: 5.0000 mg | INTRAMUSCULAR | Status: DC | PRN

## 2016-06-16 MED ORDER — LIDOCAINE HCL 1 % IJ SOLN
INTRAMUSCULAR | Status: AC
  Filled 2016-06-16: qty 20

## 2016-06-16 MED ORDER — HEPARIN (PORCINE) IN NACL 2-0.9 UNIT/ML-% IJ SOLN
INTRAMUSCULAR | Status: AC | PRN
  Administered 2016-06-16: 1000 mL

## 2016-06-16 MED ORDER — LATANOPROST 0.005 % OP SOLN
1.0000 [drp] | Freq: Every day | OPHTHALMIC | Status: DC
  Filled 2016-06-16: qty 2.5

## 2016-06-16 MED ORDER — CLOPIDOGREL BISULFATE 300 MG PO TABS
ORAL_TABLET | ORAL | Status: AC
  Filled 2016-06-16: qty 2

## 2016-06-16 MED ORDER — ACETAMINOPHEN 325 MG PO TABS: 650.0000 mg | ORAL_TABLET | ORAL | Status: DC | PRN

## 2016-06-16 MED ORDER — CLOPIDOGREL BISULFATE 300 MG PO TABS
ORAL_TABLET | ORAL | Status: DC | PRN
  Administered 2016-06-16: 600 mg via ORAL

## 2016-06-16 MED ORDER — CLOPIDOGREL BISULFATE 75 MG PO TABS
75.0000 mg | ORAL_TABLET | Freq: Every day | ORAL | Status: DC
  Administered 2016-06-17: 75 mg via ORAL
  Filled 2016-06-16: qty 1

## 2016-06-16 MED ORDER — HEPARIN SODIUM (PORCINE) 1000 UNIT/ML IJ SOLN
INTRAMUSCULAR | Status: DC | PRN
  Administered 2016-06-16: 3000 [IU] via INTRAVENOUS
  Administered 2016-06-16 (×2): 4000 [IU] via INTRAVENOUS
  Administered 2016-06-16 (×2): 5000 [IU] via INTRAVENOUS

## 2016-06-16 MED ORDER — SIMVASTATIN 20 MG PO TABS
20.0000 mg | ORAL_TABLET | Freq: Every day | ORAL | Status: DC
  Administered 2016-06-16: 20 mg via ORAL
  Filled 2016-06-16: qty 1

## 2016-06-16 MED ORDER — SODIUM CHLORIDE 0.9% FLUSH: 3.0000 mL | Freq: Two times a day (BID) | INTRAVENOUS | Status: DC

## 2016-06-16 MED ORDER — SODIUM CHLORIDE 0.9% FLUSH: 3.0000 mL | INTRAVENOUS | Status: DC | PRN

## 2016-06-16 MED ORDER — ANGIOPLASTY BOOK
Freq: Once | Status: AC
  Administered 2016-06-16: 22:00:00
  Filled 2016-06-16: qty 1

## 2016-06-16 MED ORDER — ADENOSINE 12 MG/4ML IV SOLN
INTRAVENOUS | Status: AC
  Filled 2016-06-16: qty 16

## 2016-06-16 MED ORDER — FENTANYL CITRATE (PF) 100 MCG/2ML IJ SOLN
INTRAMUSCULAR | Status: DC | PRN
Start: 2016-06-16 — End: 2016-06-16
  Administered 2016-06-16 (×3): 25 ug via INTRAVENOUS

## 2016-06-16 MED ORDER — IOPAMIDOL (ISOVUE-370) INJECTION 76%
INTRAVENOUS | Status: AC
  Filled 2016-06-16: qty 100

## 2016-06-16 MED ORDER — LABETALOL HCL 5 MG/ML IV SOLN: 10.0000 mg | INTRAVENOUS | Status: DC | PRN

## 2016-06-16 MED ORDER — MIDAZOLAM HCL 2 MG/2ML IJ SOLN
INTRAMUSCULAR | Status: DC | PRN
  Administered 2016-06-16 (×3): 1 mg via INTRAVENOUS

## 2016-06-16 MED ORDER — ENSURE ENLIVE PO LIQD
237.0000 mL | Freq: Two times a day (BID) | ORAL | Status: DC
  Administered 2016-06-17: 10:00:00 237 mL via ORAL
  Filled 2016-06-16 (×2): qty 237

## 2016-06-16 MED ORDER — LISINOPRIL 10 MG PO TABS
10.0000 mg | ORAL_TABLET | Freq: Every day | ORAL | Status: DC
  Administered 2016-06-17: 10 mg via ORAL
  Filled 2016-06-16: qty 1

## 2016-06-16 MED ORDER — ASPIRIN EC 81 MG PO TBEC
81.0000 mg | DELAYED_RELEASE_TABLET | Freq: Every day | ORAL | Status: DC
  Administered 2016-06-17: 81 mg via ORAL
  Filled 2016-06-16: qty 1

## 2016-06-16 MED ORDER — LIDOCAINE HCL (PF) 1 % IJ SOLN
INTRAMUSCULAR | Status: DC | PRN
  Administered 2016-06-16: 3 mL

## 2016-06-16 MED ORDER — SODIUM CHLORIDE 0.9 % WEIGHT BASED INFUSION
1.0000 mL/kg/h | INTRAVENOUS | Status: AC
  Administered 2016-06-16: 1 mL/kg/h via INTRAVENOUS

## 2016-06-16 MED ORDER — SODIUM CHLORIDE 0.9 % WEIGHT BASED INFUSION
3.0000 mL/kg/h | INTRAVENOUS | Status: DC
  Administered 2016-06-16: 3 mL/kg/h via INTRAVENOUS

## 2016-06-16 MED ORDER — ADENOSINE (DIAGNOSTIC) 140MCG/KG/MIN
INTRAVENOUS | Status: DC | PRN
  Administered 2016-06-16: 140 ug/kg/min via INTRAVENOUS

## 2016-06-16 MED ORDER — SODIUM CHLORIDE 0.9% FLUSH
3.0000 mL | Freq: Two times a day (BID) | INTRAVENOUS | Status: DC
  Administered 2016-06-17: 3 mL via INTRAVENOUS

## 2016-06-16 MED ORDER — VERAPAMIL HCL 2.5 MG/ML IV SOLN
INTRAVENOUS | Status: AC
  Filled 2016-06-16: qty 2

## 2016-06-16 SURGICAL SUPPLY — 27 items
BALLN EMERGE MR 3.0X12 (BALLOONS) ×3
BALLN WOLVERINE 2.50X10 (BALLOONS) ×3
BALLN ~~LOC~~ EMERGE MR 2.5X12 (BALLOONS) ×3
BALLN ~~LOC~~ EUPHORA RX 4.5X12 (BALLOONS) ×3
BALLOON EMERGE MR 3.0X12 (BALLOONS) IMPLANT
BALLOON WOLVERINE 2.50X10 (BALLOONS) IMPLANT
BALLOON ~~LOC~~ EMERGE MR 2.5X12 (BALLOONS) ×1 IMPLANT
BALLOON ~~LOC~~ EUPHORA RX 4.5X12 (BALLOONS) ×1 IMPLANT
CATH INFINITI 5 FR JL3.5 (CATHETERS) ×2 IMPLANT
CATH INFINITI 5FR ANG PIGTAIL (CATHETERS) ×2 IMPLANT
CATH INFINITI 5FR JL5 (CATHETERS) IMPLANT
CATH INFINITI JR4 5F (CATHETERS) ×1 IMPLANT
CATH LAUNCHER 6FR EBU3.5 (CATHETERS) ×1 IMPLANT
CATH LAUNCHER 6FR JR4 (CATHETERS) ×1 IMPLANT
CATH MICROCATH NAVVUS (MICROCATHETER) IMPLANT
DEVICE RAD COMP TR BAND LRG (VASCULAR PRODUCTS) ×1 IMPLANT
GLIDESHEATH SLEND SS 6F .021 (SHEATH) ×1 IMPLANT
GUIDEWIRE INQWIRE 1.5J.035X260 (WIRE) IMPLANT
INQWIRE 1.5J .035X260CM (WIRE) ×3
KIT ENCORE 26 ADVANTAGE (KITS) ×1 IMPLANT
KIT HEART LEFT (KITS) ×3 IMPLANT
MICROCATHETER NAVVUS (MICROCATHETER) ×3
PACK CARDIAC CATHETERIZATION (CUSTOM PROCEDURE TRAY) ×3 IMPLANT
STENT SYNERGY DES 4X16 (Permanent Stent) ×2 IMPLANT
TRANSDUCER W/STOPCOCK (MISCELLANEOUS) ×3 IMPLANT
TUBING CIL FLEX 10 FLL-RA (TUBING) ×3 IMPLANT
WIRE COUGAR XT STRL 190CM (WIRE) ×2 IMPLANT

## 2016-06-16 NOTE — Interval H&P Note (Signed)
Cath Lab Visit (complete for each Cath Lab visit)  Clinical Evaluation Leading to the Procedure:   ACS: No.  Non-ACS:    Anginal Classification: CCS III  Anti-ischemic medical therapy: No Therapy  Non-Invasive Test Results: Low-risk stress test findings: cardiac mortality <1%/year  Prior CABG: No previous CABG   History and Physical Interval Note:  06/16/2016 1:23 PM  Jesus Harmon  has presented today for surgery, with the diagnosis of angina  The various methods of treatment have been discussed with the patient and family. After consideration of risks, benefits and other options for treatment, the patient has consented to  Procedure(s): Left Heart Cath and Coronary Angiography (N/A) as a surgical intervention .  The patient's history has been reviewed, patient examined, no change in status, stable for surgery.  I have reviewed the patient's chart and labs.  Questions were answered to the patient's satisfaction.     Sherren Mocha

## 2016-06-16 NOTE — Care Management Note (Signed)
Case Management Note  Patient Details  Name: Jesus Harmon MRN: [id number] Date of Birth: 06-03-1929  Subjective/Objective:  From home with wife, S/p coronary stent intervention, will be on plavix and asa.    PCP Donnie Coffin                  Action/Plan: NCM will follow for dc needs.   Expected Discharge Date:                  Expected Discharge Plan:     In-House Referral:     Discharge planning Services  CM Consult  Post Acute Care Choice:    Choice offered to:     DME Arranged:    DME Agency:     HH Arranged:    HH Agency:     Status of Service:  In process, will continue to follow  If discussed at Long Length of Stay Meetings, dates discussed:    Additional Comments:  Zenon Mayo, RN 06/16/2016, 4:04 PM

## 2016-06-16 NOTE — H&P (View-Only) (Signed)
Cardiology Office Note Date:  06/14/2016   ID:  Jesus Harmon, DOB 1929-03-27, MRN [id number]  PCP:  Aurea Graff.Marlou Sa, MD  Cardiologist:  Sherren Mocha, MD    Chief Complaint  Patient presents with  . Follow-up     History of Present Illness: Jesus Harmon is a 83 y.o. male who presents for evaluation of chest pain. The patient underwent cardiac catheterization in 2011 and was noted to have 50% calcific stenosis of the proximal LAD. Medical management was felt to be appropriate. He was just recently evaluated 05/10/2016 and was noted to have shortness of breath with exertion and also a few episodes of chest discomfort. A nuclear stress test was performed and this demonstrated no evidence of ischemia. An echocardiogram showed normal LV function with no significant valvular disease. Ongoing medical therapy was recommended.  This past Friday he was out working in the yard and he developed pressure in the center of his chest. He stopped working and sat down to rest. Chest discomfort persisted and he took a total of 2 NTG. Pain improved after the first NTG and resolved completely after the second NTG. The total duration of the episode was approximately 20 minutes. He also reports recurrent episodes of shortness of breath with activity over the past week which has not been a symptom he's experienced in the past. The patient has had no resting symptoms. He is here with his wife and daughter today to discuss whether any further evaluation is indicated.  Past Medical History:  Diagnosis Date  . Arthritis    back and neck   . CAD (coronary artery disease)    a. LHC 10/2009:  pLAD 50%, small D1 70-80% (too small for PCI), EF 65% => med Rx  b. Myoview 10/2009: EF 65%, no ischemia;  c.  ETT-MV 3/14:  No ischemia, EF 63%  . Cancer ([place])    skin cancer on ear   . HLD (hyperlipidemia)   . HTN (hypertension)     Past Surgical History:  Procedure Laterality Date  . cataract surgery      bilateral   . KNEE ARTHROSCOPY    . TRANSURETHRAL RESECTION OF PROSTATE N/A 03/04/2014   Procedure: TRANSURETHRAL RESECTION OF THE PROSTATE, TRANSURETHRAL RESECTION OF THE BLADDER TUMOR;  Surgeon: Ailene Rud, MD;  Location: [facility];  Service: Urology;  Laterality: N/A;    Current Outpatient Prescriptions  Medication Sig Dispense Refill  . aspirin EC 81 MG tablet Take 81 mg by mouth daily.    Marland Kitchen latanoprost (XALATAN) 0.005 % ophthalmic solution Place 1 drop into both eyes at bedtime.     Marland Kitchen lisinopril (PRINIVIL,ZESTRIL) 10 MG tablet Take 1 tablet (10 mg total) by mouth daily. 90 tablet 3  . loratadine (CLARITIN) 10 MG tablet Take 10 mg by mouth daily.    . nitroGLYCERIN (NITROSTAT) 0.4 MG SL tablet Place 1 tablet (0.4 mg total) under the tongue every 5 (five) minutes as needed for chest pain. 25 tablet 5  . simvastatin (ZOCOR) 20 MG tablet Take 1 tablet (20 mg total) by mouth daily. 90 tablet 3   No current facility-administered medications for this visit.     Allergies:   Amoxicillin; Sulfonamide derivatives; and Sulfamethoxazole   Social History:  The patient  reports that he quit smoking about 45 years ago. He has never used smokeless tobacco. He reports that he does not drink alcohol or use drugs.   Family History:  The patient's  family history includes COPD  in his father; Heart failure in his father; Sudden death in his mother.    ROS:  Please see the history of present illness.  Otherwise, review of systems is positive for Fatigue.  All other systems are reviewed and negative.    PHYSICAL EXAM: VS:  BP (!) 150/60   Pulse 64   Ht 6' (1.829 m)   Wt 155 lb 6.4 oz (70.5 kg)   BMI 21.08 kg/m  , BMI Body mass index is 21.08 kg/m. GEN: Well nourished, well developed, pleasant elderly man in no acute distress  HEENT: normal  Neck: no JVD, no masses. No carotid bruits Cardiac: RRR without murmur or gallop                Respiratory:  clear to auscultation bilaterally,  normal work of breathing GI: soft, nontender, nondistended, + BS MS: no deformity or atrophy  Ext: no pretibial edema, pedal pulses 2+= bilaterally Skin: warm and dry, no rash Neuro:  Strength and sensation are intact Psych: euthymic mood, full affect  EKG:  EKG is ordered today. The ekg ordered today shows NSR 64 bpm, within normal limits  Recent Labs: No results found for requested labs within last 8760 hours.   Lipid Panel     Component Value Date/Time   CHOL 153 11/06/2010 1014   TRIG 34.0 11/06/2010 1014   HDL 82.40 11/06/2010 1014   CHOLHDL 2 11/06/2010 1014   VLDL 6.8 11/06/2010 1014   LDLCALC 64 11/06/2010 1014    Wt Readings from Last 3 Encounters:  06/14/16 155 lb 6.4 oz (70.5 kg)  05/20/16 156 lb (70.8 kg)  05/10/16 156 lb 8 oz (71 kg)    Cardiac Studies Reviewed: Myoview stress test 05-20-2016: Study Highlights     Nuclear stress EF: 60%. No wall motion abnormalities noted.  There was no ST segment deviation noted during stress.  Defect 1: There is a small defect of moderate severity present in the basal inferoseptal location.  The left ventricular ejection fraction is normal (55-65%).  This is a low risk study There is no ischemia in the LAD distribution or other territories.   Candee Furbish, MD   Echo 05-20-2016: Study Conclusions  - Left ventricle: The cavity size was normal. There was mild focal   basal hypertrophy of the septum. Systolic function was normal.   Wall motion was normal; there were no regional wall motion   abnormalities. Left ventricular diastolic function parameters   were normal. - Aortic valve: Trileaflet. Moderate focal thickening and   calcification involving the noncoronary cusp. - Mitral valve: There is a thin very mobile bright target off the   MV that likely represents redundant chordae tendinae. Less likely   to be small ruptured chord with only trivial MR present. There   was trivial regurgitation. - Atrial septum:  There was increased thickness of the septum,   consistent with lipomatous hypertrophy. - Pulmonic valve: There was trivial regurgitation.  ASSESSMENT AND PLAN: CAD, native vessel, with angina: The patient has developed progressive angina and shortness of breath of recent onset. While his stress Myoview scan was low risk, I am concerned about his history of known LAD stenosis and new onset exertional angina requiring nitroglycerin. I recommended definitive evaluation with cardiac catheterization and possible PCI. The patient has developed CCS functional class III symptoms. I have reviewed his recent echo and nuclear stress test results. I have reviewed the risks, indications, and alternatives to cardiac catheterization, possible angioplasty, and stenting with  the patient. Risks include but are not limited to bleeding, infection, vascular injury, stroke, myocardial infection, arrhythmia, kidney injury, radiation-related injury in the case of prolonged fluoroscopy use, emergency cardiac surgery, and death. The patient understands the risks of serious complication is 1-2 in 6568 with diagnostic cardiac cath and 1-2% or less with angioplasty/stenting.  He will continue on ASA 81 mg until cath.  Current medicines are reviewed with the patient today.  The patient does not have concerns regarding medicines.  Labs/ tests ordered today include:  No orders of the defined types were placed in this encounter.  Disposition:   Cardiac cath as outlined above  Signed, Sherren Mocha, MD  06/14/2016 11:09 AM    [facility] [place], [place], [place]  [address] Phone: [phone]; Fax: [phone]

## 2016-06-16 NOTE — Progress Notes (Signed)
TR BAND REMOVAL  LOCATION:    right radial  DEFLATED PER PROTOCOL:    Yes.    TIME BAND OFF / DRESSING APPLIED:    2330   SITE UPON ARRIVAL:    Level 0  SITE AFTER BAND REMOVAL:    Level 1  CIRCULATION SENSATION AND MOVEMENT:    Within Normal Limits   Yes.    COMMENTS:   Radial site teaching reinforced. Teach back done. Wrapped site with coban due to swelling and large bruise. Will continue to monitor and treat per protocol.

## 2016-06-17 ENCOUNTER — Encounter (HOSPITAL_COMMUNITY): Payer: Self-pay | Admitting: Cardiovascular Disease

## 2016-06-17 DIAGNOSIS — Z882 Allergy status to sulfonamides status: Secondary | ICD-10-CM | POA: Diagnosis not present

## 2016-06-17 DIAGNOSIS — Z7902 Long term (current) use of antithrombotics/antiplatelets: Secondary | ICD-10-CM | POA: Diagnosis not present

## 2016-06-17 DIAGNOSIS — Z85828 Personal history of other malignant neoplasm of skin: Secondary | ICD-10-CM | POA: Diagnosis not present

## 2016-06-17 DIAGNOSIS — I10 Essential (primary) hypertension: Secondary | ICD-10-CM | POA: Diagnosis not present

## 2016-06-17 DIAGNOSIS — Z7982 Long term (current) use of aspirin: Secondary | ICD-10-CM | POA: Diagnosis not present

## 2016-06-17 DIAGNOSIS — I25119 Atherosclerotic heart disease of native coronary artery with unspecified angina pectoris: Secondary | ICD-10-CM | POA: Diagnosis not present

## 2016-06-17 DIAGNOSIS — Z880 Allergy status to penicillin: Secondary | ICD-10-CM | POA: Diagnosis not present

## 2016-06-17 DIAGNOSIS — Z79899 Other long term (current) drug therapy: Secondary | ICD-10-CM | POA: Diagnosis not present

## 2016-06-17 DIAGNOSIS — Z87891 Personal history of nicotine dependence: Secondary | ICD-10-CM | POA: Diagnosis not present

## 2016-06-17 DIAGNOSIS — Z825 Family history of asthma and other chronic lower respiratory diseases: Secondary | ICD-10-CM | POA: Diagnosis not present

## 2016-06-17 DIAGNOSIS — M47812 Spondylosis without myelopathy or radiculopathy, cervical region: Secondary | ICD-10-CM | POA: Diagnosis not present

## 2016-06-17 DIAGNOSIS — Z8249 Family history of ischemic heart disease and other diseases of the circulatory system: Secondary | ICD-10-CM | POA: Diagnosis not present

## 2016-06-17 DIAGNOSIS — E785 Hyperlipidemia, unspecified: Secondary | ICD-10-CM | POA: Diagnosis not present

## 2016-06-17 DIAGNOSIS — I25110 Atherosclerotic heart disease of native coronary artery with unstable angina pectoris: Secondary | ICD-10-CM | POA: Diagnosis not present

## 2016-06-17 LAB — BASIC METABOLIC PANEL
ANION GAP: 7 (ref 5–15)
BUN: 16 mg/dL (ref 6–20)
CALCIUM: 8.8 mg/dL — AB (ref 8.9–10.3)
CO2: 28 mmol/L (ref 22–32)
Chloride: 104 mmol/L (ref 101–111)
Creatinine, Ser: 1.11 mg/dL (ref 0.61–1.24)
GFR calc non Af Amer: 59 mL/min — ABNORMAL LOW (ref >60)
Glucose, Bld: 100 mg/dL — ABNORMAL HIGH (ref 65–99)
POTASSIUM: 4.5 mmol/L (ref 3.5–5.1)
Sodium: 139 mmol/L (ref 135–145)

## 2016-06-17 LAB — CBC
HCT: 33.6 % — ABNORMAL LOW (ref 39.0–52.0)
HEMOGLOBIN: 10.6 g/dL — AB (ref 13.0–17.0)
MCH: 29 pg (ref 26.0–34.0)
MCHC: 31.5 g/dL (ref 30.0–36.0)
MCV: 91.8 fL (ref 78.0–100.0)
Platelets: 232 10*3/uL (ref 150–400)
RBC: 3.66 MIL/uL — AB (ref 4.22–5.81)
RDW: 13.5 % (ref 11.5–15.5)
WBC: 7.1 10*3/uL (ref 4.0–10.5)

## 2016-06-17 MED ORDER — CLOPIDOGREL BISULFATE 75 MG PO TABS: 75.0000 mg | ORAL_TABLET | Freq: Every day | ORAL | 11 refills | Status: DC

## 2016-06-17 MED FILL — CLOPIDOGREL 75 MG TABLET: 75 | 30 days supply | Qty: 30 | Fill #0

## 2016-06-17 NOTE — Progress Notes (Signed)
CARDIAC REHAB PHASE I   PRE:  Rate/Rhythm: 57 SR  BP:  Sitting: 126/53    MODE:  Ambulation: 400 ft   POST:  Rate/Rhythm: 85 SR  BP:  Sitting: 148/95       Pt ambulated 400 ft on RA, handheld assist, mostly steady gait, tolerated well.  Pt c/o very mild DOE, mild back pain/stiffness, denies cp, dizziness, declined rest stop. Pt states he does get "nervous" sometimes when he is up moving around as he occasionally experiences a loss of balance. Completed PCI/stent education.  Reviewed risk factors, PCI book, anti-platelet therapy, stent card, activity restrictions, ntg, exercise, heart healthy diet and phase 2 cardiac rehab. Pt verbalized understanding, receptive to education. Pt agrees to phase 2 cardiac rehab referral, will send to [facility] per pt request. Pt to recliner after walk, call bell within reach.     [phone] Lenna Sciara, RN, BSN 06/17/2016 8:44 AM

## 2016-06-17 NOTE — Progress Notes (Signed)
Coban removed from right radial site. Level 1; large bruise. Tissue is soft. Patient has no complaints at this time. Will continue to monitor for complications

## 2016-06-17 NOTE — Progress Notes (Signed)
Progress Note  Patient Name: Jesus Harmon Date of Encounter: 06/17/2016  Primary Cardiologist: Burt Knack  Subjective   Feeling ok. No CP or dyspnea. Feels 'nervous' and didn't sleep well.  Inpatient Medications    Scheduled Meds: . aspirin EC  81 mg Oral Daily  . clopidogrel  75 mg Oral Q breakfast  . feeding supplement (ENSURE ENLIVE)  237 mL Oral BID BM  . latanoprost  1 drop Both Eyes QHS  . lisinopril  10 mg Oral Daily  . loratadine  10 mg Oral Daily  . simvastatin  20 mg Oral q1800  . sodium chloride flush  3 mL Intravenous Q12H   Continuous Infusions: . sodium chloride     PRN Meds: sodium chloride, acetaminophen, nitroGLYCERIN, ondansetron (ZOFRAN) IV, sodium chloride flush   Vital Signs    Vitals:   06/16/16 2200 06/16/16 2300 06/17/16 0419 06/17/16 0800  BP: (!) 104/43 (!) 98/47 (!) 105/51 (!) 126/53  Pulse: (!) 58 (!) 52 (!) 55 63  Resp: 19 (!) 21 20 (!) 24  Temp:   97.8 F (36.6 C) 97.6 F (36.4 C)  TempSrc:   Oral Oral  SpO2: 97% 98% 97% 96%  Weight:   155 lb (70.3 kg)   Height:        Intake/Output Summary (Last 24 hours) at 06/17/16 0858 Last data filed at 06/17/16 0800  Gross per 24 hour  Intake          1485.18 ml  Output             1800 ml  Net          -314.82 ml   Filed Weights   06/16/16 1059 06/17/16 0419  Weight: 155 lb (70.3 kg) 155 lb (70.3 kg)    Telemetry    NSR - Personally Reviewed  ECG    Sinus bradycardia 56 bpm, otherwise WNL - Personally Reviewed  Physical Exam  Alert, oriented male in NAD, sitting in chair at bedside GEN: No acute distress.   Neck: No JVD Cardiac: RRR, no murmurs, rubs, or gallops.  Respiratory: Clear to auscultation bilaterally. GI: Soft, nontender, non-distended  MS: No edema; No deformity. Radial site clear Neuro:  Nonfocal  Psych: Normal affect   Labs    Chemistry Recent Labs Lab 06/14/16 1135 06/17/16 0309  NA 139 139  K 5.3* 4.5  CL 100 104  CO2 25 28  GLUCOSE 92 100*    BUN 19 16  CREATININE 1.08 1.11  CALCIUM 9.8 8.8*  GFRNONAA 63 59*  GFRAA 72 >60  ANIONGAP  --  7     Hematology Recent Labs Lab 06/14/16 1135 06/17/16 0309  WBC 6.1 7.1  RBC 4.10* 3.66*  HGB 11.9* 10.6*  HCT 37.2* 33.6*  MCV 91 91.8  MCH 29.0 29.0  MCHC 32.0 31.5  RDW 13.6 13.5  PLT 304 232    Cardiac EnzymesNo results for input(s): TROPONINI in the last 168 hours. No results for input(s): TROPIPOC in the last 168 hours.   BNPNo results for input(s): BNP, PROBNP in the last 168 hours.   DDimer No results for input(s): DDIMER in the last 168 hours.   Radiology    No results found.   Patient Profile     83 y.o. male with crescendo angina presented for outpatient cath/PCI 06/16/2016  Assessment & Plan    CAD with angina: found to have severe stenosis of the RCA treated with PCI (4.0 x 16 mm DES). Pt  now on ASA and plavix. Severe LAD stenosis with heavy calcification confirmed with FFR of 0.72 - unable to expand balloon. Will reassess as outpatient and consider atherectomy/PCI electively.  Otherwise stable - ready for discharge this am. Did well with cardiac rehab. Post-PCI instructions given.   Deatra James, MD  06/17/2016, 8:58 AM

## 2016-06-17 NOTE — Discharge Summary (Signed)
Discharge Summary    Patient ID: Jesus Harmon,  MRN: [id number], DOB/AGE: 81-Sep-1931 81 y.o.  Admit date: 06/16/2016 Discharge date: 06/17/2016  Primary Care Provider: Alroy Dust, L.Boyle Primary Cardiologist: Dr. Burt Knack   Discharge Diagnoses    Active Problems:   CAD, NATIVE VESSEL  (06/16/2016): a. sev sten of mid right coronary art PTCA and DES , severe prox LAD stenosis unsucc PTCA, widely patent left main and left circ   Coronary artery disease with exertional angina (HCC)   Coronary artery disease involving native coronary artery of native heart with angina pectoris (HCC) hypertension hyperlipidemia  Allergies Allergies  Allergen Reactions  . Amoxicillin Swelling and Rash    Swelling and rash to face Has patient had a PCN reaction causing immediate rash, facial/tongue/throat swelling, SOB or lightheadedness with hypotension: Yes Has patient had a PCN reaction causing severe rash involving mucus membranes or skin necrosis: No Has patient had a PCN reaction that required hospitalization: No Has patient had a PCN reaction occurring within the last 10 years: Yes If all of the above answers are "NO", then may proceed with Cephalosporin use.    . Sulfonamide Derivatives Hives  . Sulfamethoxazole Hives     History of Present Illness     [name] had a cardiac catheterization in 2011 and was noted to have 50% calcific stenosis of the proximal LAD, he was managed medically at that time. He was evaluated on 05/10/2016 and at that time was SOB with exertion and having episodes of chest discomfort. Nuclear stress test was performed and showed no evidence of ischemia. He had an echo performed and this showed normal  LV function with no significant valvular disease, ongoing therapy again recommended. Prior to arrival the patient was working in his yard and developed chest pain, a pressure to the center of his chest. It did not resolved with rest. 2 NTG were taken and it completely  resolved. He had the pain for a total of 20 minutes with associated SOB. He was seen in the office on 06/14/2016 to discuss if any further work-up or treatment is recommended.  Dr. Burt Knack recommended cardiac catheterization.   Hospital Course     Consultants: None  The patient arrived on 06/16/2016 for elective cardiac catheterization and echocardiogram.  His echo revealed normal systolic function and normal wall motion. His cardiac catheterization, revealed severe stenosis of mid right coronary artery with successful PTCA and stenting with DES. Severe proximal LAD stenosis with unsuccessful angioplasty- unable to expand balloon to rigid lesion. Widely patent left main and left circumflex. Normal LV function. Recommendation is for overnight observation. Plan is for DAPT aspirin and clopidogrel for 12 months.  Dr. Burt Knack will reassess patient in an outpatient setting in a few weeks and consider atherectomy and PCI of the LAD lesion.  The patient has had an uncomplicated hospital course and is recovering well. The right radial catheter site is stable He has been seen by Dr. Burt Knack today and deemed ready for discharge home. Dr. Burt Knack has advised that he will add patient to his evening clinic for next Friday morning. Discharge medications are listed below. Recommend outpatient lipid panel  Lipid Panel     Component Value Date/Time   CHOL 153 11/06/2010 1014   TRIG 34.0 11/06/2010 1014   HDL 82.40 11/06/2010 1014   CHOLHDL 2 11/06/2010 1014   VLDL 6.8 11/06/2010 1014   LDLCALC 64 11/06/2010 1014      Discharge Vitals Blood pressure (!) 126/53,  pulse 63, temperature 97.6 F (36.4 C), temperature source Oral, resp. rate (!) 24, height 6' (1.829 m), weight 155 lb (70.3 kg), SpO2 96 %.  Filed Weights   06/16/16 1059 06/17/16 0419  Weight: 155 lb (70.3 kg) 155 lb (70.3 kg)    Labs & Radiologic Studies     CBC  Recent Labs  06/14/16 1135 06/17/16 0309  WBC 6.1 7.1  HGB 11.9* 10.6*  HCT  37.2* 33.6*  MCV 91 91.8  PLT 304 244   Basic Metabolic Panel  Recent Labs  06/14/16 1135 06/17/16 0309  NA 139 139  K 5.3* 4.5  CL 100 104  CO2 25 28  GLUCOSE 92 100*  BUN 19 16  CREATININE 1.08 1.11  CALCIUM 9.8 8.8*    No results found.   Diagnostic Studies/Procedures    Procedures: 05/16/2016  Coronary Balloon Angioplasty  Coronary Stent Intervention  Intravascular Pressure Wire/FFR Study  Left Heart Cath and Coronary Angiography   1. Severe stenosis of the mid right coronary artery treated successfully with PTCA and stenting using a 4.0 x 16 mm Synergy DES 2. Severe proximal LAD stenosis confirmed by FFR analysis (0.72) but unsuccessful angioplasty because of inability to expand a balloon in this rigid lesion 3. Widely patent left main and left circumflex 4. Normal LV function by noninvasive assessment  Recommendation: The patient will be observed overnight. Will continue dual antiplatelet therapy with aspirin and clopidogrel for 12 months. Consider atherectomy and PCI of the LAD lesion in a few weeks after reassessment of the patient in the outpatient setting.   Echocardiography 05/20/2016  Study Conclusions  - Left ventricle: The cavity size was normal. There was mild focal   basal hypertrophy of the septum. Systolic function was normal.   Wall motion was normal; there were no regional wall motion   abnormalities. Left ventricular diastolic function parameters   were normal. - Aortic valve: Trileaflet. Moderate focal thickening and   calcification involving the noncoronary cusp. - Mitral valve: There is a thin very mobile bright target off the   MV that likely represents redundant chordae tendinae. Less likely   to be small ruptured chord with only trivial MR present. There   was trivial regurgitation. - Atrial septum: There was increased thickness of the septum,   consistent with lipomatous hypertrophy. - Pulmonic valve: There was trivial  regurgitation.   Disposition   Pt is being discharged home today in good condition.  Follow-up Plans & Appointments    Follow-up Information    Sherren Mocha, MD Follow up.   Specialty:  Cardiology Why:  The office will call you to schedule appointment within the next few days. Please call the office on Tuesday if you have not heard from [place]. Contact information: [address] [place] [address] [phone]          Discharge Instructions    Amb Referral to Cardiac Rehabilitation    Complete by:  As directed    Diagnosis:   Coronary Stents PTCA     Diet - low sodium heart healthy    Complete by:  As directed    Discharge instructions    Complete by:  As directed    Call [facility] at [phone] if any bleeding, swelling or drainage at cath site, or develop new tingling to fingers or toes. May shower, no tub baths for 48 hours for groin sticks. No lifting over 5 pounds for 3 days.  No  Driving for 3 days   Increase activity slowly    Complete by:  As directed    May shower / Bathe    Complete by:  As directed       Discharge Medications   Allergies as of 06/17/2016      Reactions   Amoxicillin Swelling, Rash   Swelling and rash to face Has patient had a PCN reaction causing immediate rash, facial/tongue/throat swelling, SOB or lightheadedness with hypotension: Yes Has patient had a PCN reaction causing severe rash involving mucus membranes or skin necrosis: No Has patient had a PCN reaction that required hospitalization: No Has patient had a PCN reaction occurring within the last 10 years: Yes If all of the above answers are "NO", then may proceed with Cephalosporin use.   Sulfonamide Derivatives Hives   Sulfamethoxazole Hives      Medication List    TAKE these medications   aspirin EC 81 MG tablet Take 81 mg by mouth daily.   clopidogrel 75 MG tablet Commonly known as:  PLAVIX Take 1 tablet (75 mg total) by  mouth daily with breakfast.   latanoprost 0.005 % ophthalmic solution Commonly known as:  XALATAN Place 1 drop into both eyes at bedtime.   lisinopril 10 MG tablet Commonly known as:  PRINIVIL,ZESTRIL Take 1 tablet (10 mg total) by mouth daily.   loratadine 10 MG tablet Commonly known as:  CLARITIN Take 10 mg by mouth daily.   nitroGLYCERIN 0.4 MG SL tablet Commonly known as:  NITROSTAT Place 1 tablet (0.4 mg total) under the tongue every 5 (five) minutes as needed for chest pain.   simvastatin 20 MG tablet Commonly known as:  ZOCOR Take 1 tablet (20 mg total) by mouth daily.        Outstanding Labs/Studies   Recommend outpatient lipid panel.  Duration of Discharge Encounter   Greater than 30 minutes including physician time.  Signed, Linus Mako PA-C 06/17/2016, 10:00 AM

## 2016-06-17 NOTE — Care Management Note (Signed)
Case Management Note  Patient Details  Name: Jesus Harmon MRN: [id number] Date of Birth: 23-May-1929  Subjective/Objective:   From home with wife, S/p coronary stent intervention, will be on plavix and asa.  For dc today, no needs.   PCP Donnie Coffin                             Action/Plan:   Expected Discharge Date:  06/17/16               Expected Discharge Plan:  Home/Self Care  In-House Referral:     Discharge planning Services  CM Consult  Post Acute Care Choice:    Choice offered to:     DME Arranged:    DME Agency:     HH Arranged:    HH Agency:     Status of Service:  Completed, signed off  If discussed at [employer] of Stay Meetings, dates discussed:    Additional Comments:  Zenon Mayo, RN 06/17/2016, 10:31 AM

## 2016-06-23 ENCOUNTER — Telehealth (HOSPITAL_COMMUNITY): Payer: Self-pay

## 2016-06-23 NOTE — Telephone Encounter (Signed)
Patient insurance is active and insurance verified. Patient has [employer] - no co-payment, no deductible, out of pocket $5500/$466.19 has been met, no co-insurance, no pre-authorization and no limit on visit. Passport/reference [phone].

## 2016-06-23 NOTE — Telephone Encounter (Signed)
Patient returned my call. Patient is seeing cardiologist on 06/25/16 and may have another procedure done following this appointment. We will hold off on scheduling until patient sees cardiologist. I informed patient that I will contact him next week after Carlette RN navigator review office note from 06/25/16 to see if patient is going to be scheduled for another procedure. Patient verbalized understanding.

## 2016-06-23 NOTE — Telephone Encounter (Signed)
I called and spoke to patient wife Jesus Harmon about her husband scheduling for cardiac rehab. Patient was not home and patient wife took my information to give to husband to return my call. I gave patient wife my contact information for husband to return my call.

## 2016-06-24 MED FILL — LATANOPROST 0.005% EYE DRP: 0.005 | 25 days supply | Qty: 3 | Fill #3

## 2016-06-25 ENCOUNTER — Ambulatory Visit (INDEPENDENT_AMBULATORY_CARE_PROVIDER_SITE_OTHER): Payer: Medicare Other | Admitting: Cardiovascular Disease

## 2016-06-25 ENCOUNTER — Encounter: Payer: Self-pay | Admitting: Cardiovascular Disease

## 2016-06-25 ENCOUNTER — Encounter (INDEPENDENT_AMBULATORY_CARE_PROVIDER_SITE_OTHER): Payer: Self-pay

## 2016-06-25 VITALS — BP 140/60 | HR 64 | Ht 72.0 in | Wt 153.1 lb

## 2016-06-25 DIAGNOSIS — I25810 Atherosclerosis of coronary artery bypass graft(s) without angina pectoris: Secondary | ICD-10-CM

## 2016-06-25 NOTE — Patient Instructions (Signed)

## 2016-06-25 NOTE — Progress Notes (Signed)
Cardiology Office Note Date:  06/25/2016   ID:  [name], DOB 16-Sep-1929, MRN [id number]  PCP:  Aurea Graff.Marlou Sa, MD  Cardiologist:  Sherren Mocha, MD    Chief Complaint  Patient presents with  . Post hospital follow up     History of Present Illness: Jesus Harmon is a 83 y.o. male who presents for Follow-up of coronary artery disease. The patient was seen about 10 days ago with new onset chest pain. His symptoms were very typical for angina. He had recently undergone a stress nuclear scan that demonstrated no ischemia. However, with his typical symptoms he was referred for cardiac catheterization which demonstrated severe stenosis of the right coronary artery. The patient was treated with PCI using a drug-eluting stent. He also had moderate stenosis of the proximal LAD with a positive pressure wire test. Balloon angioplasty was attempted but the balloon would not dilate because of severe calcification. He returns today for follow-up and further decision-making regarding his residual LAD stenosis.  The patient is here with his wife and daughter today. He is doing very well. He's been walking regularly with no symptoms at all. He specifically denies chest pain, chest pressure, fatigue, or shortness of breath. He's tolerating aspirin and Plavix without significant bleeding or bruising problems.   Past Medical History:  Diagnosis Date  . Arthritis    back and neck (06/16/2016)  . CAD (coronary artery disease)    a. LHC 10/2009:  pLAD 50%, small D1 70-80% (too small for PCI), EF 65% => med Rx  b. Myoview 10/2009: EF 65%, no ischemia;  c.  ETT-MV 3/14:  No ischemia, EF 63%  . History of kidney stones    "passed them"  . HLD (hyperlipidemia)    "on RX; never had high cholestrol" (06/16/2016)  . HTN (hypertension)    "on RX; never HTN" (06/16/2016)  . Migraine    "in high school" (06/16/2016)    Past Surgical History:  Procedure Laterality Date  . BASAL CELL CARCINOMA EXCISION       "one of my ears; big one on my nose; one on my left shoulder" (06/16/2016)  . CARDIAC CATHETERIZATION  2017   "treated w/RX"  . CATARACT EXTRACTION W/ INTRAOCULAR LENS  IMPLANT, BILATERAL Bilateral   . CORONARY ANGIOPLASTY WITH STENT PLACEMENT  06/16/2016  . CORONARY BALLOON ANGIOPLASTY N/A 06/16/2016   Procedure: Coronary Balloon Angioplasty;  Surgeon: Sherren Mocha, MD;  Location: [place] [facility];  Service: Cardiovascular;  Laterality: N/A;  Prox LAD  . CORONARY STENT INTERVENTION  06/16/2016   Procedure: Coronary Stent Intervention;  Surgeon: Sherren Mocha, MD;  Location: [place] [facility];  Service: Cardiovascular;;  Synergy 4.0x16 to Mid RCA  . INTRAVASCULAR PRESSURE WIRE/FFR STUDY N/A 06/16/2016   Procedure: Intravascular Pressure Wire/FFR Study;  Surgeon: Sherren Mocha, MD;  Location: [place] [facility];  Service: Cardiovascular;  Laterality: N/A;  Prox LAD  . KNEE ARTHROSCOPY     "not sure which side"  . LEFT HEART CATH AND CORONARY ANGIOGRAPHY N/A 06/16/2016   Procedure: Left Heart Cath and Coronary Angiography;  Surgeon: Sherren Mocha, MD;  Location: [place] [facility];  Service: Cardiovascular;  Laterality: N/A;  . TONSILLECTOMY    . TRANSURETHRAL RESECTION OF PROSTATE N/A 03/04/2014   Procedure: TRANSURETHRAL RESECTION OF THE PROSTATE, TRANSURETHRAL RESECTION OF THE BLADDER TUMOR;  Surgeon: Ailene Rud, MD;  Location: [facility];  Service: Urology;  Laterality: N/A;    Current Outpatient Prescriptions  Medication Sig  Dispense Refill  . aspirin EC 81 MG tablet Take 81 mg by mouth daily.    . clopidogrel (PLAVIX) 75 MG tablet Take 1 tablet (75 mg total) by mouth daily with breakfast. 30 tablet 11  . latanoprost (XALATAN) 0.005 % ophthalmic solution Place 1 drop into both eyes at bedtime.     Marland Kitchen lisinopril (PRINIVIL,ZESTRIL) 10 MG tablet Take 1 tablet (10 mg total) by mouth daily. 90 tablet 3  . loratadine (CLARITIN) 10 MG tablet Take 10 mg by mouth daily.    .  nitroGLYCERIN (NITROSTAT) 0.4 MG SL tablet Place 1 tablet (0.4 mg total) under the tongue every 5 (five) minutes as needed for chest pain. 25 tablet 5  . simvastatin (ZOCOR) 20 MG tablet Take 1 tablet (20 mg total) by mouth daily. 90 tablet 3   No current facility-administered medications for this visit.     Allergies:   Amoxicillin; Sulfonamide derivatives; and Sulfamethoxazole   Social History:  The patient  reports that he quit smoking about 45 years ago. He has a 18.00 pack-year smoking history. He has never used smokeless tobacco. He reports that he does not drink alcohol or use drugs.   Family History:  The patient's family history includes COPD in his father; Heart failure in his father; Sudden death in his mother.    ROS:  Please see the history of present illness.   All other systems are reviewed and negative.    PHYSICAL EXAM: VS:  BP 140/60   Pulse 64   Ht 6' (1.829 m)   Wt 153 lb 1.9 oz (69.5 kg)   BMI 20.77 kg/m  , BMI Body mass index is 20.77 kg/m. GEN: Well nourished, well developed, elderly male in no acute distress  HEENT: normal  Neck: no JVD, no masses.  Cardiac: RRR without murmur or gallop                Respiratory:  clear to auscultation bilaterally, normal work of breathing GI: soft, nontender, nondistended, + BS MS: no deformity or atrophy  Ext: no pretibial edema, right radial site is well-healed with no ecchymosis or hematoma Skin: warm and dry, no rash Neuro:  Strength and sensation are intact Psych: euthymic mood, full affect  EKG:  EKG is not ordered today.  Recent Labs: 06/17/2016: BUN 16; Creatinine, Ser 1.11; Hemoglobin 10.6; Platelets 232; Potassium 4.5; Sodium 139   Lipid Panel     Component Value Date/Time   CHOL 153 11/06/2010 1014   TRIG 34.0 11/06/2010 1014   HDL 82.40 11/06/2010 1014   CHOLHDL 2 11/06/2010 1014   VLDL 6.8 11/06/2010 1014   LDLCALC 64 11/06/2010 1014      Wt Readings from Last 3 Encounters:  06/25/16 153 lb  1.9 oz (69.5 kg)  06/17/16 155 lb (70.3 kg)  06/14/16 155 lb 6.4 oz (70.5 kg)     Cardiac Studies Reviewed: Cardiac Cath 06/16/16: Conclusion   1. Severe stenosis of the mid right coronary artery treated successfully with PTCA and stenting using a 4.0 x 16 mm Synergy DES 2. Severe proximal LAD stenosis confirmed by FFR analysis (0.72) but unsuccessful angioplasty because of inability to expand a balloon in this rigid lesion 3. Widely patent left main and left circumflex 4. Normal LV function by noninvasive assessment  Recommendation: The patient will be observed overnight. Will continue dual antiplatelet therapy with aspirin and clopidogrel for 12 months. Consider atherectomy and PCI of the LAD lesion in a few weeks after reassessment  of the patient in the outpatient setting.  Indications   Angina pectoris ([place]) [I20.9 (ICD-10-CM)]  Procedural Details/Technique   Technical Details INDICATION: Exertional angina, crescendo pattern, CCS Class 3 symptoms  PROCEDURAL DETAILS: The right wrist was prepped, draped, and anesthetized with 1% lidocaine. Using the modified Seldinger technique, a 5/6 French Slender sheath was introduced into the right radial artery. 3 mg of verapamil was administered through the sheath, weight-based unfractionated heparin was administered intravenously. Standard Judkins catheters were used for selective coronary angiography. LV pressure is recorded with a JR4 catheter. PCI of the RCA is performed after the diagnostic procedure. FFR and PCI of the LAD are performed. Catheter exchanges were performed over an exchange length guidewire. There were no immediate procedural complications. A TR band was used for radial hemostasis at the completion of the procedure. The patient was transferred to the post catheterization recovery area for further monitoring.    Estimated blood loss <50 mL.  During this procedure the patient was administered the following to achieve and maintain  moderate conscious sedation: Versed 3 mg, Fentanyl 75 mcg, while the patient's heart rate, blood pressure, and oxygen saturation were continuously monitored. The period of conscious sedation was 101 minutes, of which I was present face-to-face 100% of this time.    Coronary Findings   Dominance: Right  Left Anterior Descending  Prox LAD to Mid LAD lesion, 70% stenosed. The lesion is eccentric. The lesion is severely calcified.  Angioplasty: Lesion crossed with guidewire using a WIRE COUGAR XT STRL 190CM. Angioplasty alone was performed using a BALLOON WOL[place] 2.50X10. Maximum pressure: 18 atm. The pre-interventional distal flow is normal (TIMI 3). The post-interventional distal flow is normal (TIMI 3). The intervention was unsuccessful due to inability to expand the balloon. No complications occurred at this lesion. Pressure wire/FFR was performed on the lesion using a MICROCATHETER NAVVUS. FFR measurement: 0.72. There is a moderate heavily calcified eccentric lesion in the proximal LAD. There is minimal progression from the previous study, but a significant stepdown is suggestive of a potentially hemodynamically significant lesion. An EBU 3.5 cm guide is used. A cougar wire is advanced across the lesion. A Navvus catheter is equalized at the end of the guide tip and then advanced across the lesion. The resting FFR is 0.88. The peak FFR with IV adenosine is 0.72. A 2.5 x 10 mm Wolverine cutting balloon is advanced across the lesion and dilated to 12 atm (burst pressure). 2 inflations were done and there was a significant waist at the lesion site. A 2.5 x 12 mm noncompliant balloon was then advanced and dilated to 18 atm on 2 inflations with the second inflation causing the balloon to burst. The lesion was extremely rigid and I felt further attempts would risk serious dissection or even perforation. There is no angiographic evidence of dissection at the completion of the procedure. The balloon and wires are  all removed. Final angiography demonstrates no change at the lesion site and TIMI-3 flow in the vessel.  There is a 70% residual stenosis post intervention.  Right Coronary Artery  Mid RCA lesion, 90% stenosed. The lesion is concentric.  Angioplasty: Pre-stent angioplasty was performed. A STENT SYNERGY DES 4X16 drug eluting stent was successfully placed. Post-stent angioplasty was performed using a BALLOON [place] EUPHORA [id number]. The pre-interventional distal flow is normal (TIMI 3). The post-interventional distal flow is normal (TIMI 3). The intervention was successful . No complications occurred at this lesion. There is a tight lesion in the mid right  coronary artery The previous cath study. A JR4 guide is used. A cougar wire is used to cross the lesion. Heparin is used for anticoagulation area the patient is preloaded with Plavix 600 mg on the table. A therapeutic ACT is achieved. The lesion is predilated with a 3.0 mm balloon. There is a localized balloon dissection present but it appears nonflow limiting. The lesion is then stented with a 4.0 mm x 16 mm Synergy DES deployed at 14 atm. The stent is postdilated with a 4.5 mm noncompliant balloon to 14 atm.  There is no residual stenosis post intervention.  Coronary Diagrams   Diagnostic Diagram       Post-Intervention Diagram          ASSESSMENT AND PLAN: 1.  Coronary artery disease, native vessel, without angina: The patient is doing very well after undergoing PCI of severe stenosis in the right coronary artery. He is maintained on aspirin and Plavix and should continue this for a period of at least 12 months. He is taking a statin drug. He is walking 30 minutes without any symptoms at all. He has residual obstructive disease in the LAD with positive FFR analysis last week. There was only slight change in the appearance of his LAD compared to his previous catheterization several years ago. Considering the fact that treatment of this lesion would  require atherectomy which comes with slightly higher risk, I think it is reasonable to observe the patient for now. If he develops recurrent anginal symptoms or exertional dyspnea and I would favor proceeding with atherectomy and stenting. However, medical therapy for this 83 year old gentleman with no symptoms seems most appropriate at this time.  2. Hyperlipidemia: Continue simvastatin  3. Hypertension: Continue lisinopril. Blood pressure is well controlled.  Current medicines are reviewed with the patient today.  The patient does not have concerns regarding medicines.  Labs/ tests ordered today include:  No orders of the defined types were placed in this encounter.   Disposition:   FU 6 months  Signed, Sherren Mocha, MD  06/25/2016 11:18 AM    [address], [place], [place]  [address] Phone: [phone]; Fax: [phone]

## 2016-07-06 ENCOUNTER — Telehealth (HOSPITAL_COMMUNITY): Payer: Self-pay | Admitting: Pharmacist

## 2016-07-07 NOTE — Telephone Encounter (Signed)
Cardiac Rehab Medication Review by a Pharmacist  Does the patient  feel that his/her medications are working for him/her?  yes  Has the patient been experiencing any side effects to the medications prescribed?  no  Does the patient measure his/her own blood pressure or blood glucose at home?  yes   Does the patient have any problems obtaining medications due to transportation or finances?   no  Understanding of regimen: good Understanding of indications: good Potential of compliance: good    Pharmacist comments: 27 YOM reached on the second call attempt. He had no concerns and was able to accurately describe what medications he takes and how he takes them. He doesn't think the OTC Allegra is working that well, but otherwise, had no other complaints.  Myer Peer Grayland Ormond), PharmD  PGY1 Pharmacy Resident Pager: [phone] 07/07/2016 4:42 PM

## 2016-07-08 DIAGNOSIS — H401131 Primary open-angle glaucoma, bilateral, mild stage: Secondary | ICD-10-CM | POA: Diagnosis not present

## 2016-07-12 ENCOUNTER — Telehealth (HOSPITAL_COMMUNITY): Payer: Self-pay

## 2016-07-12 NOTE — Telephone Encounter (Signed)
*  Insurance Update*  [employer] Medicare  - no co-payment, no deductible, out of pocket $5500/$789.07 has been met and no co-insurance, no pre-authorization and no limit on visit. Passport/reference [phone].

## 2016-07-13 ENCOUNTER — Telehealth (HOSPITAL_COMMUNITY): Payer: Self-pay | Admitting: Cardiac Rehabilitation

## 2016-07-13 ENCOUNTER — Inpatient Hospital Stay (HOSPITAL_COMMUNITY): Admission: RE | Admit: 2016-07-13 | Payer: Medicare Other | Source: Ambulatory Visit

## 2016-07-13 NOTE — Telephone Encounter (Signed)
pc to pt to r/s orientation appointment.  Left message on answering machine.

## 2016-07-15 MED FILL — CLOPIDOGREL 75 MG TABLET: 75 | 30 days supply | Qty: 30 | Fill #1

## 2016-07-19 ENCOUNTER — Encounter (HOSPITAL_COMMUNITY): Payer: Medicare Other

## 2016-07-21 ENCOUNTER — Encounter (HOSPITAL_COMMUNITY): Payer: Medicare Other

## 2016-07-21 ENCOUNTER — Telehealth (HOSPITAL_COMMUNITY): Payer: Self-pay

## 2016-07-21 NOTE — Telephone Encounter (Signed)
*  Updated insurance* [employer] Medicare - no co-payment, no deductible, out of pocket $5500/$814.07 has been met, no co-insurance, no pre-authorization and no limit. Passport/reference [phone].

## 2016-07-22 ENCOUNTER — Encounter (HOSPITAL_COMMUNITY): Payer: Self-pay

## 2016-07-22 ENCOUNTER — Encounter (HOSPITAL_COMMUNITY)
Admission: RE | Admit: 2016-07-22 | Discharge: 2016-07-22 | Disposition: A | Discharge status: Home / Self Care | Payer: Medicare Other | Source: Ambulatory Visit | Attending: Cardiovascular Disease | Admitting: Cardiovascular Disease

## 2016-07-22 VITALS — BP 144/70 | HR 69 | Ht 68.5 in | Wt 155.2 lb

## 2016-07-22 DIAGNOSIS — Z7902 Long term (current) use of antithrombotics/antiplatelets: Secondary | ICD-10-CM | POA: Insufficient documentation

## 2016-07-22 DIAGNOSIS — M1990 Unspecified osteoarthritis, unspecified site: Secondary | ICD-10-CM | POA: Diagnosis not present

## 2016-07-22 DIAGNOSIS — I2510 Atherosclerotic heart disease of native coronary artery without angina pectoris: Secondary | ICD-10-CM | POA: Insufficient documentation

## 2016-07-22 DIAGNOSIS — Z7982 Long term (current) use of aspirin: Secondary | ICD-10-CM | POA: Diagnosis not present

## 2016-07-22 DIAGNOSIS — Z87442 Personal history of urinary calculi: Secondary | ICD-10-CM | POA: Diagnosis not present

## 2016-07-22 DIAGNOSIS — E785 Hyperlipidemia, unspecified: Secondary | ICD-10-CM | POA: Diagnosis not present

## 2016-07-22 DIAGNOSIS — Z79899 Other long term (current) drug therapy: Secondary | ICD-10-CM | POA: Diagnosis not present

## 2016-07-22 DIAGNOSIS — Z955 Presence of coronary angioplasty implant and graft: Secondary | ICD-10-CM | POA: Insufficient documentation

## 2016-07-22 DIAGNOSIS — Z87891 Personal history of nicotine dependence: Secondary | ICD-10-CM | POA: Insufficient documentation

## 2016-07-22 NOTE — Progress Notes (Signed)
Jesus Harmon 83 y.o. male       Nutrition Note  1. 06/16/16 Status post coronary artery stent placement    Past Medical History:  Diagnosis Date  . Arthritis    back and neck (06/16/2016)  . CAD (coronary artery disease)    a. LHC 10/2009:  pLAD 50%, small D1 70-80% (too small for PCI), EF 65% => med Rx  b. Myoview 10/2009: EF 65%, no ischemia;  c.  ETT-MV 3/14:  No ischemia, EF 63%  . History of kidney stones    "passed them"  . HLD (hyperlipidemia)    "on RX; never had high cholestrol" (06/16/2016)  . HTN (hypertension)    "on RX; never HTN" (06/16/2016)  . Migraine    "in high school" (06/16/2016)   Meds reviewed.  HT: Ht Readings from Last 1 Encounters:  07/22/16 5' 8.5" (1.74 m)    WT: Wt Readings from Last 3 Encounters:  07/22/16 155 lb 3.3 oz (70.4 kg)  06/25/16 153 lb 1.9 oz (69.5 kg)  06/17/16 155 lb (70.3 kg)     BMI 23.3   Current tobacco use? No  Labs:  No results found for: HGBA1C CBG (last 3)  No results for input(s): GLUCAP in the last 72 hours.  Nutrition Note Spoke with pt. Pt with slow, chronic wt loss over the past 7 years. Pt has lost 22 lb over the past 7 years according to EMR, which is a 12.4% decrease in body wt. Pt denies decreased appetite or difficulty chewing/swallowing. Age-appropriate nutrition recommendations discussed. Nutrition plan and goals reviewed with pt. Pt is following Step 1 of the Therapeutic Lifestyle Changes diet. Pt expressed understanding of the information reviewed. Pt aware of nutrition education classes offered and is not interested in attending nutrition classes or receiving nutrition class information at this time.   Nutrition Diagnosis Food-and nutrition-related knowledge deficit related to lack of exposure to information as related to diagnosis of: ? CVD   Nutrition Intervention ? Pt's individual nutrition plan reviewed with pt.   Nutrition Goal(s):  Pt does not want to set any nutrition goals at this time  Plan:   Pt to attend nutrition classes ? Portion Distortion  Will provide client-centered nutrition education as part of interdisciplinary care.   Monitor and evaluate progress toward nutrition goal with team.  Derek Mound, M.Ed, RD, LDN, CDE 07/22/2016 12:17 PM

## 2016-07-22 NOTE — Progress Notes (Signed)
Cardiac Individual Treatment Plan  Patient Details  Name: Jesus Harmon MRN: [id number] Date of Birth: 28-Sep-1929 Referring Provider:     CARDIAC REHAB PHASE II ORIENTATION from 07/22/2016 in [place]  Referring Provider  Sherren Mocha       Initial Encounter Date:    CARDIAC REHAB PHASE II ORIENTATION from 07/22/2016 in [place]  Date  07/22/16  Referring Provider  Sherren Mocha       Visit Diagnosis: 06/16/16 Status post coronary artery stent placement  Patient's Home Medications on Admission:  Current Outpatient Prescriptions:  .  acetaminophen (TYLENOL) 500 MG chewable tablet, Chew 500 mg by mouth every 6 (six) hours as needed for pain., Disp: , Rfl:  .  aspirin EC 81 MG tablet, Take 81 mg by mouth daily., Disp: , Rfl:  .  clopidogrel (PLAVIX) 75 MG tablet, Take 1 tablet (75 mg total) by mouth daily with breakfast., Disp: 30 tablet, Rfl: 11 .  fexofenadine (ALLEGRA) 180 MG tablet, Take 180 mg by mouth daily., Disp: , Rfl:  .  latanoprost (XALATAN) 0.005 % ophthalmic solution, Place 1 drop into both eyes at bedtime. , Disp: , Rfl:  .  lisinopril (PRINIVIL,ZESTRIL) 10 MG tablet, Take 1 tablet (10 mg total) by mouth daily., Disp: 90 tablet, Rfl: 3 .  nitroGLYCERIN (NITROSTAT) 0.4 MG SL tablet, Place 1 tablet (0.4 mg total) under the tongue every 5 (five) minutes as needed for chest pain., Disp: 25 tablet, Rfl: 5 .  simvastatin (ZOCOR) 20 MG tablet, Take 1 tablet (20 mg total) by mouth daily., Disp: 90 tablet, Rfl: 3  Past Medical History: Past Medical History:  Diagnosis Date  . Arthritis    back and neck (06/16/2016)  . CAD (coronary artery disease)    a. LHC 10/2009:  pLAD 50%, small D1 70-80% (too small for PCI), EF 65% => med Rx  b. Myoview 10/2009: EF 65%, no ischemia;  c.  ETT-MV 3/14:  No ischemia, EF 63%  . History of kidney stones    "passed them"  . HLD (hyperlipidemia)    "on RX; never had high  cholestrol" (06/16/2016)  . HTN (hypertension)    "on RX; never HTN" (06/16/2016)  . Migraine    "in high school" (06/16/2016)    Tobacco Use: History  Smoking Status  . Former Smoker  . Packs/day: 1.00  . Years: 18.00  . Quit date: 01/12/1971  Smokeless Tobacco  . Never Used    Labs: Recent Review Flowsheet Data    Labs for ITP Cardiac and Pulmonary Rehab Latest Ref Rng & Units 10/26/2009 12/08/2009 11/06/2010   Cholestrol 0 - 200 mg/dL 175 ATP III CLASSIFICATION: <200     mg/dL   Desirable 200-239  mg/dL   Borderline High >=240    mg/dL   High 162 153   LDLCALC 0 - 99 mg/dL 94 Total Cholesterol/HDL:CHD Risk Coronary Heart Disease Risk Table Men   Women 1/2 Average Risk   3.4   3.3 Average Risk       5.0   4.4 2 X Average Risk   9.6   7.1 3 X Average Risk  23.4   11.0 Use the calculated Patient Ratio above and the CHD Risk Table to determine the patient's CHD Risk. ATP III CLASSIFICATION (LDL): <100     mg/dL   Optimal 100-129  mg/dL   Near or Above Optimal 130-159  mg/dL   Borderline 160-189  mg/dL  High >190     mg/dL   Very High 82 64   HDL >39.00 mg/dL 70 71.20 82.40   Trlycerides 0.0 - 149.0 mg/dL 55 44.0 34.0      Capillary Blood Glucose: No results found for: GLUCAP   Exercise Target Goals: Date: 07/22/16  Exercise Program Goal: Individual exercise prescription set with THRR, safety & activity barriers. Participant demonstrates ability to understand and report RPE using BORG scale, to self-measure pulse accurately, and to acknowledge the importance of the exercise prescription.  Exercise Prescription Goal: Starting with aerobic activity 30 plus minutes a day, 3 days per week for initial exercise prescription. Provide home exercise prescription and guidelines that participant acknowledges understanding prior to discharge.  Activity Barriers & Risk Stratification:     Activity Barriers & Cardiac Risk Stratification - 07/22/16 0959      Activity  Barriers & Cardiac Risk Stratification   Activity Barriers Back Problems   Cardiac Risk Stratification Moderate      6 Minute Walk:     6 Minute Walk    Row Name 07/22/16 0901 07/22/16 1017       6 Minute Walk   Phase Initial  -    Distance  - 888 feet    Walk Time 6 minutes  -    # of Rest Breaks  - 0    MPH  - 1.68    METS  - 1.62    RPE  - 11    VO2 Peak  - 5.68    Symptoms  - No    Resting HR  - 69 bpm    Resting BP  - 144/70    Max Ex. HR  - 90 bpm    Max Ex. BP  - 142/76    2 Minute Post BP  - 122/58       Oxygen Initial Assessment:   Oxygen Re-Evaluation:   Oxygen Discharge (Final Oxygen Re-Evaluation):   Initial Exercise Prescription:     Initial Exercise Prescription - 07/22/16 1000      Date of Initial Exercise RX and Referring Provider   Date 07/22/16   Referring Provider Sherren Mocha      Treadmill   MPH 1.5   Grade 0   Minutes 10   METs 2.15     Recumbant Bike   Level 1.5   Minutes 10   METs 2     NuStep   Level 2   SPM 60   Minutes 10   METs 1.7     Prescription Details   Frequency (times per week) 3   Duration Progress to 30 minutes of continuous aerobic without signs/symptoms of physical distress     Intensity   THRR 40-80% of Max Heartrate 54-108   Ratings of Perceived Exertion 11-15   Perceived Dyspnea 0-4     Progression   Progression Continue to progress workloads to maintain intensity without signs/symptoms of physical distress.     Resistance Training   Training Prescription Yes   Weight 2lbs   Reps 10-15      Perform Capillary Blood Glucose checks as needed.  Exercise Prescription Changes:   Exercise Comments:   Exercise Goals and Review:     Exercise Goals    Row Name 07/22/16 0959             Exercise Goals   Increase Physical Activity Yes       Intervention Provide advice, education, support and counseling about physical activity/exercise  needs.;Develop an individualized exercise  prescription for aerobic and resistive training based on initial evaluation findings, risk stratification, comorbidities and participant's personal goals.       Expected Outcomes Achievement of increased cardiorespiratory fitness and enhanced flexibility, muscular endurance and strength shown through measurements of functional capacity and personal statement of participant.       Increase Strength and Stamina Yes       Intervention Provide advice, education, support and counseling about physical activity/exercise needs.;Develop an individualized exercise prescription for aerobic and resistive training based on initial evaluation findings, risk stratification, comorbidities and participant's personal goals.       Expected Outcomes Achievement of increased cardiorespiratory fitness and enhanced flexibility, muscular endurance and strength shown through measurements of functional capacity and personal statement of participant.          Exercise Goals Re-Evaluation :    Discharge Exercise Prescription (Final Exercise Prescription Changes):   Nutrition:  Target Goals: Understanding of nutrition guidelines, daily intake of sodium 1500mg , cholesterol 200mg , calories 30% from fat and 7% or less from saturated fats, daily to have 5 or more servings of fruits and vegetables.  Biometrics:     Pre Biometrics - 07/22/16 1017      Pre Biometrics   Waist Circumference 38 inches   Hip Circumference 38 inches   Waist to Hip Ratio 1 %   Triceps Skinfold 18 mm   % Body Fat 26.3 %   Grip Strength 41 kg   Flexibility 0 in   Single Leg Stand 0 seconds       Nutrition Therapy Plan and Nutrition Goals:     Nutrition Therapy & Goals - 07/22/16 1229      Nutrition Therapy   Diet Therapeutic Lifestyle Changes     Intervention Plan   Intervention Prescribe, educate and counsel regarding individualized specific dietary modifications aiming towards targeted core components such as weight,  hypertension, lipid management, diabetes, heart failure and other comorbidities.   Expected Outcomes Short Term Goal: Understand basic principles of dietary content, such as calories, fat, sodium, cholesterol and nutrients.;Long Term Goal: Adherence to prescribed nutrition plan.      Nutrition Discharge: Nutrition Scores:     Nutrition Assessments - 07/22/16 1223      MEDFICTS Scores   Pre Score 67      Nutrition Goals Re-Evaluation:   Nutrition Goals Re-Evaluation:   Nutrition Goals Discharge (Final Nutrition Goals Re-Evaluation):   Psychosocial: Target Goals: Acknowledge presence or absence of significant depression and/or stress, maximize coping skills, provide positive support system. Participant is able to verbalize types and ability to use techniques and skills needed for reducing stress and depression.  Initial Review & Psychosocial Screening:     Initial Psych Review & Screening - 07/22/16 1328      Initial Review   Current issues with None Identified     Family Dynamics   Good Support System? Yes     Barriers   Psychosocial barriers to participate in program The patient should benefit from training in stress management and relaxation.     Screening Interventions   Interventions Encouraged to exercise      Quality of Life Scores:     Quality of Life - 07/22/16 1018      Quality of Life Scores   Health/Function Pre 27.54 %   Socioeconomic Pre 26.29 %   Psych/Spiritual Pre 27.36 %   Family Pre 30 %   GLOBAL Post 27.61 %  PHQ-9: Recent Review Flowsheet Data    There is no flowsheet data to display.     Interpretation of Total Score  Total Score Depression Severity:  1-4 = Minimal depression, 5-9 = Mild depression, 10-14 = Moderate depression, 15-19 = Moderately severe depression, 20-27 = Severe depression   Psychosocial Evaluation and Intervention:   Psychosocial Re-Evaluation:   Psychosocial Discharge (Final Psychosocial  Re-Evaluation):   Vocational Rehabilitation: Provide vocational rehab assistance to qualifying candidates.   Vocational Rehab Evaluation & Intervention:     Vocational Rehab - 07/22/16 1329      Initial Vocational Rehab Evaluation & Intervention   Assessment shows need for Vocational Rehabilitation No  Pt does not plan to return to competive employment      Education: Education Goals: Education classes will be provided on a weekly basis, covering required topics. Participant will state understanding/return demonstration of topics presented.  Learning Barriers/Preferences:     Learning Barriers/Preferences - 07/22/16 0908      Learning Barriers/Preferences   Learning Barriers None   Learning Preferences Written Material;Skilled Demonstration;Audio      Education Topics: Count Your Pulse:  -Group instruction provided by verbal instruction, demonstration, patient participation and written materials to support subject.  Instructors address importance of being able to find your pulse and how to count your pulse when at home without a heart monitor.  Patients get hands on experience counting their pulse with staff help and individually.   Heart Attack, Angina, and Risk Factor Modification:  -Group instruction provided by verbal instruction, video, and written materials to support subject.  Instructors address signs and symptoms of angina and heart attacks.    Also discuss risk factors for heart disease and how to make changes to improve heart health risk factors.   Functional Fitness:  -Group instruction provided by verbal instruction, demonstration, patient participation, and written materials to support subject.  Instructors address safety measures for doing things around the house.  Discuss how to get up and down off the floor, how to pick things up properly, how to safely get out of a chair without assistance, and balance training.   Meditation and Mindfulness:  -Group  instruction provided by verbal instruction, patient participation, and written materials to support subject.  Instructor addresses importance of mindfulness and meditation practice to help reduce stress and improve awareness.  Instructor also leads participants through a meditation exercise.    Stretching for Flexibility and Mobility:  -Group instruction provided by verbal instruction, patient participation, and written materials to support subject.  Instructors lead participants through series of stretches that are designed to increase flexibility thus improving mobility.  These stretches are additional exercise for major muscle groups that are typically performed during regular warm up and cool down.   Hands Only CPR:  -Group verbal, video, and participation provides a basic overview of AHA guidelines for community CPR. Role-play of emergencies allow participants the opportunity to practice calling for help and chest compression technique with discussion of AED use.   Hypertension: -Group verbal and written instruction that provides a basic overview of hypertension including the most recent diagnostic guidelines, risk factor reduction with self-care instructions and medication management.    Nutrition I class: Heart Healthy Eating:  -Group instruction provided by PowerPoint slides, verbal discussion, and written materials to support subject matter. The instructor gives an explanation and review of the Therapeutic Lifestyle Changes diet recommendations, which includes a discussion on lipid goals, dietary fat, sodium, fiber, plant stanol/sterol esters, sugar, and the components  of a well-balanced, healthy diet.   Nutrition II class: Lifestyle Skills:  -Group instruction provided by PowerPoint slides, verbal discussion, and written materials to support subject matter. The instructor gives an explanation and review of label reading, grocery shopping for heart health, heart healthy recipe  modifications, and ways to make healthier choices when eating out.   Diabetes Question & Answer:  -Group instruction provided by PowerPoint slides, verbal discussion, and written materials to support subject matter. The instructor gives an explanation and review of diabetes co-morbidities, pre- and post-prandial blood glucose goals, pre-exercise blood glucose goals, signs, symptoms, and treatment of hypoglycemia and hyperglycemia, and foot care basics.   Diabetes Blitz:  -Group instruction provided by PowerPoint slides, verbal discussion, and written materials to support subject matter. The instructor gives an explanation and review of the physiology behind type 1 and type 2 diabetes, diabetes medications and rational behind using different medications, pre- and post-prandial blood glucose recommendations and Hemoglobin A1c goals, diabetes diet, and exercise including blood glucose guidelines for exercising safely.    Portion Distortion:  -Group instruction provided by PowerPoint slides, verbal discussion, written materials, and food models to support subject matter. The instructor gives an explanation of serving size versus portion size, changes in portions sizes over the last 20 years, and what consists of a serving from each food group.   Stress Management:  -Group instruction provided by verbal instruction, video, and written materials to support subject matter.  Instructors review role of stress in heart disease and how to cope with stress positively.     Exercising on Your Own:  -Group instruction provided by verbal instruction, power point, and written materials to support subject.  Instructors discuss benefits of exercise, components of exercise, frequency and intensity of exercise, and end points for exercise.  Also discuss use of nitroglycerin and activating EMS.  Review options of places to exercise outside of rehab.  Review guidelines for sex with heart disease.   Cardiac Drugs I:   -Group instruction provided by verbal instruction and written materials to support subject.  Instructor reviews cardiac drug classes: antiplatelets, anticoagulants, beta blockers, and statins.  Instructor discusses reasons, side effects, and lifestyle considerations for each drug class.   Cardiac Drugs II:  -Group instruction provided by verbal instruction and written materials to support subject.  Instructor reviews cardiac drug classes: angiotensin converting enzyme inhibitors (ACE-I), angiotensin II receptor blockers (ARBs), nitrates, and calcium channel blockers.  Instructor discusses reasons, side effects, and lifestyle considerations for each drug class.   Anatomy and Physiology of the Circulatory System:  Group verbal and written instruction and models provide basic cardiac anatomy and physiology, with the coronary electrical and arterial systems. Review of: AMI, Angina, Valve disease, Heart Failure, Peripheral Artery Disease, Cardiac Arrhythmia, Pacemakers, and the ICD.   Other Education:  -Group or individual verbal, written, or video instructions that support the educational goals of the cardiac rehab program.   Knowledge Questionnaire Score:     Knowledge Questionnaire Score - 07/22/16 1016      Knowledge Questionnaire Score   Pre Score 18/24      Core Components/Risk Factors/Patient Goals at Admission:     Personal Goals and Risk Factors at Admission - 07/22/16 1018      Core Components/Risk Factors/Patient Goals on Admission   Hypertension Yes   Intervention Provide education on lifestyle modifcations including regular physical activity/exercise, weight management, moderate sodium restriction and increased consumption of fresh fruit, vegetables, and low fat dairy, alcohol moderation, and smoking  cessation.;Monitor prescription use compliance.   Expected Outcomes Short Term: Continued assessment and intervention until BP is < 140/16mm HG in hypertensive participants. <  130/32mm HG in hypertensive participants with diabetes, heart failure or chronic kidney disease.;Long Term: Maintenance of blood pressure at goal levels.   Lipids Yes   Intervention Provide education and support for participant on nutrition & aerobic/resistive exercise along with prescribed medications to achieve LDL 70mg , HDL >40mg .   Expected Outcomes Short Term: Participant states understanding of desired cholesterol values and is compliant with medications prescribed. Participant is following exercise prescription and nutrition guidelines.;Long Term: Cholesterol controlled with medications as prescribed, with individualized exercise RX and with personalized nutrition plan. Value goals: LDL < 70mg , HDL > 40 mg.      Core Components/Risk Factors/Patient Goals Review:    Core Components/Risk Factors/Patient Goals at Discharge (Final Review):    ITP Comments:     ITP Comments    Row Name 07/22/16 0843           ITP Comments Medical Director, Dr. Fransico Him          Comments:  Patient attended orientation from 0800 to 1000 to review rules and guidelines for program. Completed 6 minute walk test, Intitial ITP, and exercise prescription.  VSS. Telemetry-SR with tall T wave.  Asymptomatic. Brief Psychosocial Assessment reveals no barriers to participating in cardiac rehab.  Pt is looking forward to returning next week. Cherre Huger, BSN Cardiac and Training and development officer

## 2016-07-22 NOTE — Progress Notes (Signed)
Cardiac Rehab Medication Review by a Pharmacist  Does the patient  feel that his/her medications are working for him/her?  yes  Has the patient been experiencing any side effects to the medications prescribed?  no  Does the patient measure his/her own blood pressure or blood glucose at home?  no   Does the patient have any problems obtaining medications due to transportation or finances?   no  Understanding of regimen: good Understanding of indications: good Potential of compliance: good  Pharmacist comments: Spoke with patient in cardiac rehab. No concerns about medication regimens. Asked if clopidogrel was blood thinner. Told patient it helps to prevent clots/blockages in his stents, and he acknowledged that he understood.   Nida Boatman, PharmD PGY1 Acute Care Pharmacy Resident Pager: [phone] 07/22/2016 8:43 AM

## 2016-07-23 ENCOUNTER — Encounter (HOSPITAL_COMMUNITY): Payer: Medicare Other

## 2016-07-23 MED FILL — LISINOPRIL 10 MG TABLET: 10 | 90 days supply | Qty: 90 | Fill #1

## 2016-07-26 ENCOUNTER — Encounter (HOSPITAL_COMMUNITY): Payer: Medicare Other

## 2016-07-26 ENCOUNTER — Encounter (HOSPITAL_COMMUNITY)
Admission: RE | Admit: 2016-07-26 | Discharge: 2016-07-26 | Disposition: A | Discharge status: Home / Self Care | Payer: Medicare Other | Source: Ambulatory Visit | Attending: Cardiovascular Disease | Admitting: Cardiovascular Disease

## 2016-07-26 DIAGNOSIS — Z955 Presence of coronary angioplasty implant and graft: Secondary | ICD-10-CM

## 2016-07-26 NOTE — Progress Notes (Signed)
Cardiac Individual Treatment Plan  Patient Details  Name: Jesus Harmon MRN: [id number] Date of Birth: 28-Sep-1929 Referring Provider:     CARDIAC REHAB PHASE II ORIENTATION from 07/22/2016 in [place]  Referring Provider  Sherren Mocha       Initial Encounter Date:    CARDIAC REHAB PHASE II ORIENTATION from 07/22/2016 in [place]  Date  07/22/16  Referring Provider  Sherren Mocha       Visit Diagnosis: 06/16/16 Status post coronary artery stent placement  Patient's Home Medications on Admission:  Current Outpatient Prescriptions:  .  acetaminophen (TYLENOL) 500 MG chewable tablet, Chew 500 mg by mouth every 6 (six) hours as needed for pain., Disp: , Rfl:  .  aspirin EC 81 MG tablet, Take 81 mg by mouth daily., Disp: , Rfl:  .  clopidogrel (PLAVIX) 75 MG tablet, Take 1 tablet (75 mg total) by mouth daily with breakfast., Disp: 30 tablet, Rfl: 11 .  fexofenadine (ALLEGRA) 180 MG tablet, Take 180 mg by mouth daily., Disp: , Rfl:  .  latanoprost (XALATAN) 0.005 % ophthalmic solution, Place 1 drop into both eyes at bedtime. , Disp: , Rfl:  .  lisinopril (PRINIVIL,ZESTRIL) 10 MG tablet, Take 1 tablet (10 mg total) by mouth daily., Disp: 90 tablet, Rfl: 3 .  nitroGLYCERIN (NITROSTAT) 0.4 MG SL tablet, Place 1 tablet (0.4 mg total) under the tongue every 5 (five) minutes as needed for chest pain., Disp: 25 tablet, Rfl: 5 .  simvastatin (ZOCOR) 20 MG tablet, Take 1 tablet (20 mg total) by mouth daily., Disp: 90 tablet, Rfl: 3  Past Medical History: Past Medical History:  Diagnosis Date  . Arthritis    back and neck (06/16/2016)  . CAD (coronary artery disease)    a. LHC 10/2009:  pLAD 50%, small D1 70-80% (too small for PCI), EF 65% => med Rx  b. Myoview 10/2009: EF 65%, no ischemia;  c.  ETT-MV 3/14:  No ischemia, EF 63%  . History of kidney stones    "passed them"  . HLD (hyperlipidemia)    "on RX; never had high  cholestrol" (06/16/2016)  . HTN (hypertension)    "on RX; never HTN" (06/16/2016)  . Migraine    "in high school" (06/16/2016)    Tobacco Use: History  Smoking Status  . Former Smoker  . Packs/day: 1.00  . Years: 18.00  . Quit date: 01/12/1971  Smokeless Tobacco  . Never Used    Labs: Recent Review Flowsheet Data    Labs for ITP Cardiac and Pulmonary Rehab Latest Ref Rng & Units 10/26/2009 12/08/2009 11/06/2010   Cholestrol 0 - 200 mg/dL 175 ATP III CLASSIFICATION: <200     mg/dL   Desirable 200-239  mg/dL   Borderline High >=240    mg/dL   High 162 153   LDLCALC 0 - 99 mg/dL 94 Total Cholesterol/HDL:CHD Risk Coronary Heart Disease Risk Table Men   Women 1/2 Average Risk   3.4   3.3 Average Risk       5.0   4.4 2 X Average Risk   9.6   7.1 3 X Average Risk  23.4   11.0 Use the calculated Patient Ratio above and the CHD Risk Table to determine the patient's CHD Risk. ATP III CLASSIFICATION (LDL): <100     mg/dL   Optimal 100-129  mg/dL   Near or Above Optimal 130-159  mg/dL   Borderline 160-189  mg/dL  High >190     mg/dL   Very High 82 64   HDL >39.00 mg/dL 70 71.20 82.40   Trlycerides 0.0 - 149.0 mg/dL 55 44.0 34.0      Capillary Blood Glucose: No results found for: GLUCAP   Exercise Target Goals:    Exercise Program Goal: Individual exercise prescription set with THRR, safety & activity barriers. Participant demonstrates ability to understand and report RPE using BORG scale, to self-measure pulse accurately, and to acknowledge the importance of the exercise prescription.  Exercise Prescription Goal: Starting with aerobic activity 30 plus minutes a day, 3 days per week for initial exercise prescription. Provide home exercise prescription and guidelines that participant acknowledges understanding prior to discharge.  Activity Barriers & Risk Stratification:     Activity Barriers & Cardiac Risk Stratification - 07/22/16 0959      Activity Barriers & Cardiac  Risk Stratification   Activity Barriers Back Problems   Cardiac Risk Stratification Moderate      6 Minute Walk:     6 Minute Walk    Row Name 07/22/16 0901 07/22/16 1017       6 Minute Walk   Phase Initial  -    Distance  - 888 feet    Walk Time 6 minutes  -    # of Rest Breaks  - 0    MPH  - 1.68    METS  - 1.62    RPE  - 11    VO2 Peak  - 5.68    Symptoms  - No    Resting HR  - 69 bpm    Resting BP  - 144/70    Max Ex. HR  - 90 bpm    Max Ex. BP  - 142/76    2 Minute Post BP  - 122/58       Oxygen Initial Assessment:   Oxygen Re-Evaluation:   Oxygen Discharge (Final Oxygen Re-Evaluation):   Initial Exercise Prescription:     Initial Exercise Prescription - 07/22/16 1000      Date of Initial Exercise RX and Referring Provider   Date 07/22/16   Referring Provider Sherren Mocha      Treadmill   MPH 1.5   Grade 0   Minutes 10   METs 2.15     Recumbant Bike   Level 1.5   Minutes 10   METs 2     NuStep   Level 2   SPM 60   Minutes 10   METs 1.7     Prescription Details   Frequency (times per week) 3   Duration Progress to 30 minutes of continuous aerobic without signs/symptoms of physical distress     Intensity   THRR 40-80% of Max Heartrate 54-108   Ratings of Perceived Exertion 11-15   Perceived Dyspnea 0-4     Progression   Progression Continue to progress workloads to maintain intensity without signs/symptoms of physical distress.     Resistance Training   Training Prescription Yes   Weight 2lbs   Reps 10-15      Perform Capillary Blood Glucose checks as needed.  Exercise Prescription Changes:      Exercise Prescription Changes    Row Name 07/26/16 1500             Response to Exercise   Blood Pressure (Admit) 144/70       Blood Pressure (Exercise) 144/70       Blood Pressure (Exit) 130/80  Heart Rate (Admit) 64 bpm       Heart Rate (Exercise) 93 bpm       Heart Rate (Exit) 66 bpm       Rating of  Perceived Exertion (Exercise) 11       Symptoms none       Comments pt was oriented to exercise equipment on 07/26/16       Duration Continue with 30 min of aerobic exercise without signs/symptoms of physical distress.       Intensity THRR unchanged         Progression   Progression Continue to progress workloads to maintain intensity without signs/symptoms of physical distress.       Average METs 2         Resistance Training   Training Prescription Yes       Weight 2lbs       Reps 10-15       Time 10 Minutes         Treadmill   MPH 1.5       Grade 0       Minutes 10       METs 2.15         Recumbant Bike   Level 1.5       Minutes 10       METs 1.8         NuStep   Level 2       SPM 60       Minutes 10       METs 1.8          Exercise Comments:      Exercise Comments    Row Name 07/26/16 1502           Exercise Comments Pt completed first exercise session today and reported no signs of SOB, CP or dizziness. Pt was oriented to exercise equipment on 07/26/16.           Exercise Goals and Review:      Exercise Goals    Row Name 07/22/16 0959             Exercise Goals   Increase Physical Activity Yes       Intervention Provide advice, education, support and counseling about physical activity/exercise needs.;Develop an individualized exercise prescription for aerobic and resistive training based on initial evaluation findings, risk stratification, comorbidities and participant's personal goals.       Expected Outcomes Achievement of increased cardiorespiratory fitness and enhanced flexibility, muscular endurance and strength shown through measurements of functional capacity and personal statement of participant.       Increase Strength and Stamina Yes       Intervention Provide advice, education, support and counseling about physical activity/exercise needs.;Develop an individualized exercise prescription for aerobic and resistive training based on initial  evaluation findings, risk stratification, comorbidities and participant's personal goals.       Expected Outcomes Achievement of increased cardiorespiratory fitness and enhanced flexibility, muscular endurance and strength shown through measurements of functional capacity and personal statement of participant.          Exercise Goals Re-Evaluation :    Discharge Exercise Prescription (Final Exercise Prescription Changes):     Exercise Prescription Changes - 07/26/16 1500      Response to Exercise   Blood Pressure (Admit) 144/70   Blood Pressure (Exercise) 144/70   Blood Pressure (Exit) 130/80   Heart Rate (Admit) 64 bpm   Heart Rate (Exercise) 93 bpm  Heart Rate (Exit) 66 bpm   Rating of Perceived Exertion (Exercise) 11   Symptoms none   Comments pt was oriented to exercise equipment on 07/26/16   Duration Continue with 30 min of aerobic exercise without signs/symptoms of physical distress.   Intensity THRR unchanged     Progression   Progression Continue to progress workloads to maintain intensity without signs/symptoms of physical distress.   Average METs 2     Resistance Training   Training Prescription Yes   Weight 2lbs   Reps 10-15   Time 10 Minutes     Treadmill   MPH 1.5   Grade 0   Minutes 10   METs 2.15     Recumbant Bike   Level 1.5   Minutes 10   METs 1.8     NuStep   Level 2   SPM 60   Minutes 10   METs 1.8      Nutrition:  Target Goals: Understanding of nutrition guidelines, daily intake of sodium 1500mg , cholesterol 200mg , calories 30% from fat and 7% or less from saturated fats, daily to have 5 or more servings of fruits and vegetables.  Biometrics:     Pre Biometrics - 07/22/16 1017      Pre Biometrics   Waist Circumference 38 inches   Hip Circumference 38 inches   Waist to Hip Ratio 1 %   Triceps Skinfold 18 mm   % Body Fat 26.3 %   Grip Strength 41 kg   Flexibility 0 in   Single Leg Stand 0 seconds       Nutrition  Therapy Plan and Nutrition Goals:     Nutrition Therapy & Goals - 07/22/16 1229      Nutrition Therapy   Diet Therapeutic Lifestyle Changes     Intervention Plan   Intervention Prescribe, educate and counsel regarding individualized specific dietary modifications aiming towards targeted core components such as weight, hypertension, lipid management, diabetes, heart failure and other comorbidities.   Expected Outcomes Short Term Goal: Understand basic principles of dietary content, such as calories, fat, sodium, cholesterol and nutrients.;Long Term Goal: Adherence to prescribed nutrition plan.      Nutrition Discharge: Nutrition Scores:     Nutrition Assessments - 07/22/16 1223      MEDFICTS Scores   Pre Score 67      Nutrition Goals Re-Evaluation:   Nutrition Goals Re-Evaluation:   Nutrition Goals Discharge (Final Nutrition Goals Re-Evaluation):   Psychosocial: Target Goals: Acknowledge presence or absence of significant depression and/or stress, maximize coping skills, provide positive support system. Participant is able to verbalize types and ability to use techniques and skills needed for reducing stress and depression.  Initial Review & Psychosocial Screening:     Initial Psych Review & Screening - 07/22/16 1328      Initial Review   Current issues with None Identified     Family Dynamics   Good Support System? Yes     Barriers   Psychosocial barriers to participate in program The patient should benefit from training in stress management and relaxation.     Screening Interventions   Interventions Encouraged to exercise      Quality of Life Scores:     Quality of Life - 07/22/16 1018      Quality of Life Scores   Health/Function Pre 27.54 %   Socioeconomic Pre 26.29 %   Psych/Spiritual Pre 27.36 %   Family Pre 30 %   GLOBAL Post 27.61 %  PHQ-9: Recent Review Flowsheet Data    Depression screen [facility] 2/9 07/26/2016   Decreased Interest 0    Down, Depressed, Hopeless 0   PHQ - 2 Score 0     Interpretation of Total Score  Total Score Depression Severity:  1-4 = Minimal depression, 5-9 = Mild depression, 10-14 = Moderate depression, 15-19 = Moderately severe depression, 20-27 = Severe depression   Psychosocial Evaluation and Intervention:     Psychosocial Evaluation - 07/26/16 1312      Psychosocial Evaluation & Interventions   Interventions Encouraged to exercise with the program and follow exercise prescription;Stress management education;Relaxation education   Continue Psychosocial Services  Follow up required by staff      Psychosocial Re-Evaluation:   Psychosocial Discharge (Final Psychosocial Re-Evaluation):   Vocational Rehabilitation: Provide vocational rehab assistance to qualifying candidates.   Vocational Rehab Evaluation & Intervention:     Vocational Rehab - 07/22/16 1329      Initial Vocational Rehab Evaluation & Intervention   Assessment shows need for Vocational Rehabilitation No  Pt does not plan to return to competive employment      Education: Education Goals: Education classes will be provided on a weekly basis, covering required topics. Participant will state understanding/return demonstration of topics presented.  Learning Barriers/Preferences:     Learning Barriers/Preferences - 07/22/16 0908      Learning Barriers/Preferences   Learning Barriers None   Learning Preferences Written Material;Skilled Demonstration;Audio      Education Topics: Count Your Pulse:  -Group instruction provided by verbal instruction, demonstration, patient participation and written materials to support subject.  Instructors address importance of being able to find your pulse and how to count your pulse when at home without a heart monitor.  Patients get hands on experience counting their pulse with staff help and individually.   Heart Attack, Angina, and Risk Factor Modification:  -Group instruction  provided by verbal instruction, video, and written materials to support subject.  Instructors address signs and symptoms of angina and heart attacks.    Also discuss risk factors for heart disease and how to make changes to improve heart health risk factors.   Functional Fitness:  -Group instruction provided by verbal instruction, demonstration, patient participation, and written materials to support subject.  Instructors address safety measures for doing things around the house.  Discuss how to get up and down off the floor, how to pick things up properly, how to safely get out of a chair without assistance, and balance training.   Meditation and Mindfulness:  -Group instruction provided by verbal instruction, patient participation, and written materials to support subject.  Instructor addresses importance of mindfulness and meditation practice to help reduce stress and improve awareness.  Instructor also leads participants through a meditation exercise.    Stretching for Flexibility and Mobility:  -Group instruction provided by verbal instruction, patient participation, and written materials to support subject.  Instructors lead participants through series of stretches that are designed to increase flexibility thus improving mobility.  These stretches are additional exercise for major muscle groups that are typically performed during regular warm up and cool down.   Hands Only CPR:  -Group verbal, video, and participation provides a basic overview of AHA guidelines for community CPR. Role-play of emergencies allow participants the opportunity to practice calling for help and chest compression technique with discussion of AED use.   Hypertension: -Group verbal and written instruction that provides a basic overview of hypertension including the most recent diagnostic guidelines, risk factor  reduction with self-care instructions and medication management.    Nutrition I class: Heart Healthy  Eating:  -Group instruction provided by PowerPoint slides, verbal discussion, and written materials to support subject matter. The instructor gives an explanation and review of the Therapeutic Lifestyle Changes diet recommendations, which includes a discussion on lipid goals, dietary fat, sodium, fiber, plant stanol/sterol esters, sugar, and the components of a well-balanced, healthy diet.   Nutrition II class: Lifestyle Skills:  -Group instruction provided by PowerPoint slides, verbal discussion, and written materials to support subject matter. The instructor gives an explanation and review of label reading, grocery shopping for heart health, heart healthy recipe modifications, and ways to make healthier choices when eating out.   Diabetes Question & Answer:  -Group instruction provided by PowerPoint slides, verbal discussion, and written materials to support subject matter. The instructor gives an explanation and review of diabetes co-morbidities, pre- and post-prandial blood glucose goals, pre-exercise blood glucose goals, signs, symptoms, and treatment of hypoglycemia and hyperglycemia, and foot care basics.   Diabetes Blitz:  -Group instruction provided by PowerPoint slides, verbal discussion, and written materials to support subject matter. The instructor gives an explanation and review of the physiology behind type 1 and type 2 diabetes, diabetes medications and rational behind using different medications, pre- and post-prandial blood glucose recommendations and Hemoglobin A1c goals, diabetes diet, and exercise including blood glucose guidelines for exercising safely.    Portion Distortion:  -Group instruction provided by PowerPoint slides, verbal discussion, written materials, and food models to support subject matter. The instructor gives an explanation of serving size versus portion size, changes in portions sizes over the last 20 years, and what consists of a serving from each food  group.   Stress Management:  -Group instruction provided by verbal instruction, video, and written materials to support subject matter.  Instructors review role of stress in heart disease and how to cope with stress positively.     Exercising on Your Own:  -Group instruction provided by verbal instruction, power point, and written materials to support subject.  Instructors discuss benefits of exercise, components of exercise, frequency and intensity of exercise, and end points for exercise.  Also discuss use of nitroglycerin and activating EMS.  Review options of places to exercise outside of rehab.  Review guidelines for sex with heart disease.   Cardiac Drugs I:  -Group instruction provided by verbal instruction and written materials to support subject.  Instructor reviews cardiac drug classes: antiplatelets, anticoagulants, beta blockers, and statins.  Instructor discusses reasons, side effects, and lifestyle considerations for each drug class.   Cardiac Drugs II:  -Group instruction provided by verbal instruction and written materials to support subject.  Instructor reviews cardiac drug classes: angiotensin converting enzyme inhibitors (ACE-I), angiotensin II receptor blockers (ARBs), nitrates, and calcium channel blockers.  Instructor discusses reasons, side effects, and lifestyle considerations for each drug class.   Anatomy and Physiology of the Circulatory System:  Group verbal and written instruction and models provide basic cardiac anatomy and physiology, with the coronary electrical and arterial systems. Review of: AMI, Angina, Valve disease, Heart Failure, Peripheral Artery Disease, Cardiac Arrhythmia, Pacemakers, and the ICD.   Other Education:  -Group or individual verbal, written, or video instructions that support the educational goals of the cardiac rehab program.   Knowledge Questionnaire Score:     Knowledge Questionnaire Score - 07/22/16 1016      Knowledge  Questionnaire Score   Pre Score 18/24      Core Components/Risk  Factors/Patient Goals at Admission:     Personal Goals and Risk Factors at Admission - 07/22/16 1018      Core Components/Risk Factors/Patient Goals on Admission   Hypertension Yes   Intervention Provide education on lifestyle modifcations including regular physical activity/exercise, weight management, moderate sodium restriction and increased consumption of fresh fruit, vegetables, and low fat dairy, alcohol moderation, and smoking cessation.;Monitor prescription use compliance.   Expected Outcomes Short Term: Continued assessment and intervention until BP is < 140/68mm HG in hypertensive participants. < 130/76mm HG in hypertensive participants with diabetes, heart failure or chronic kidney disease.;Long Term: Maintenance of blood pressure at goal levels.   Lipids Yes   Intervention Provide education and support for participant on nutrition & aerobic/resistive exercise along with prescribed medications to achieve LDL 70mg , HDL >40mg .   Expected Outcomes Short Term: Participant states understanding of desired cholesterol values and is compliant with medications prescribed. Participant is following exercise prescription and nutrition guidelines.;Long Term: Cholesterol controlled with medications as prescribed, with individualized exercise RX and with personalized nutrition plan. Value goals: LDL < 70mg , HDL > 40 mg.      Core Components/Risk Factors/Patient Goals Review:    Core Components/Risk Factors/Patient Goals at Discharge (Final Review):    ITP Comments:     ITP Comments    Row Name 07/22/16 0843           ITP Comments Medical Director, Dr. Fransico Him          Comments:  Pt started full exercise at phase II cardiac rehab today.  Pt tolerated light exercise without difficulty. VSS, telemetry-SR with noted ectopy, asymptomatic.  Medication list reconciled. Pt denies barriers to medication compliance.   PSYCHOSOCIAL ASSESSMENT:  PHQ-0. Pt completed pre assessment quality of life survey. Pt scored the following     Quality of Life - 07/22/16 1018      Quality of Life Scores   Health/Function Pre 27.54 %   Socioeconomic Pre 26.29 %   Psych/Spiritual Pre 27.36 %   Family Pre 30 %   GLOBAL Post 27.61 %      Pt exhibits positive coping skills, hopeful outlook with supportive family. No psychosocial needs identified at this time, no psychosocial interventions necessary.    Pt enjoys wood working. Pt desires to be in better shape and increase strength.  Pt oriented to exercise equipment and routine.    Understanding verbalized. Cherre Huger, BSN Cardiac and Training and development officer

## 2016-07-28 ENCOUNTER — Encounter (HOSPITAL_COMMUNITY): Payer: Medicare Other

## 2016-07-28 ENCOUNTER — Encounter (HOSPITAL_COMMUNITY)
Admission: RE | Admit: 2016-07-28 | Discharge: 2016-07-28 | Disposition: A | Discharge status: Home / Self Care | Payer: Medicare Other | Source: Ambulatory Visit | Attending: Cardiovascular Disease | Admitting: Cardiovascular Disease

## 2016-07-28 DIAGNOSIS — Z955 Presence of coronary angioplasty implant and graft: Secondary | ICD-10-CM

## 2016-07-30 ENCOUNTER — Encounter (HOSPITAL_COMMUNITY)
Admission: RE | Admit: 2016-07-30 | Discharge: 2016-07-30 | Disposition: A | Discharge status: Home / Self Care | Payer: Medicare Other | Source: Ambulatory Visit | Attending: Cardiovascular Disease | Admitting: Cardiovascular Disease

## 2016-07-30 ENCOUNTER — Encounter (HOSPITAL_COMMUNITY): Payer: Medicare Other

## 2016-07-30 DIAGNOSIS — Z955 Presence of coronary angioplasty implant and graft: Secondary | ICD-10-CM

## 2016-08-02 ENCOUNTER — Encounter (HOSPITAL_COMMUNITY): Payer: Medicare Other

## 2016-08-02 ENCOUNTER — Encounter (HOSPITAL_COMMUNITY)
Admission: RE | Admit: 2016-08-02 | Discharge: 2016-08-02 | Disposition: A | Discharge status: Home / Self Care | Payer: Medicare Other | Source: Ambulatory Visit | Attending: Cardiovascular Disease | Admitting: Cardiovascular Disease

## 2016-08-02 DIAGNOSIS — Z955 Presence of coronary angioplasty implant and graft: Secondary | ICD-10-CM | POA: Diagnosis not present

## 2016-08-04 ENCOUNTER — Encounter (HOSPITAL_COMMUNITY)
Admission: RE | Admit: 2016-08-04 | Discharge: 2016-08-04 | Disposition: A | Discharge status: Home / Self Care | Payer: Medicare Other | Source: Ambulatory Visit | Attending: Cardiovascular Disease | Admitting: Cardiovascular Disease

## 2016-08-04 ENCOUNTER — Encounter (HOSPITAL_COMMUNITY): Payer: Medicare Other

## 2016-08-04 DIAGNOSIS — Z955 Presence of coronary angioplasty implant and graft: Secondary | ICD-10-CM

## 2016-08-04 MED FILL — SIMVASTATIN 20 MG TABLET: 20 | 90 days supply | Qty: 90 | Fill #1

## 2016-08-06 ENCOUNTER — Encounter (HOSPITAL_COMMUNITY): Payer: Medicare Other

## 2016-08-06 ENCOUNTER — Encounter (HOSPITAL_COMMUNITY)
Admission: RE | Admit: 2016-08-06 | Discharge: 2016-08-06 | Disposition: A | Discharge status: Home / Self Care | Payer: Medicare Other | Source: Ambulatory Visit | Attending: Cardiovascular Disease | Admitting: Cardiovascular Disease

## 2016-08-06 DIAGNOSIS — Z955 Presence of coronary angioplasty implant and graft: Secondary | ICD-10-CM | POA: Diagnosis not present

## 2016-08-06 NOTE — Progress Notes (Signed)
Reviewed home exercise with pt today.  Pt plans to walk for exercise, 3x/week in addition to coming to cardiac rehab.  Reviewed THR, pulse, RPE, sign and symptoms, NTG use, and when to call 911 or MD.  Also discussed weather considerations and indoor options.  Pt voiced understanding.    Navaeh Kehres,MS,ACSM RCEP 

## 2016-08-09 ENCOUNTER — Encounter (HOSPITAL_COMMUNITY)
Admission: RE | Admit: 2016-08-09 | Discharge: 2016-08-09 | Disposition: A | Discharge status: Home / Self Care | Payer: Medicare Other | Source: Ambulatory Visit | Attending: Cardiovascular Disease | Admitting: Cardiovascular Disease

## 2016-08-09 ENCOUNTER — Encounter (HOSPITAL_COMMUNITY): Payer: Medicare Other

## 2016-08-09 DIAGNOSIS — Z955 Presence of coronary angioplasty implant and graft: Secondary | ICD-10-CM

## 2016-08-09 MED FILL — LATANOPROST 0.005% EYE DRP: 0.005 | 25 days supply | Qty: 3 | Fill #0

## 2016-08-10 MED FILL — CLOPIDOGREL 75 MG TABLET: 75 | 30 days supply | Qty: 30 | Fill #2

## 2016-08-11 ENCOUNTER — Encounter (HOSPITAL_COMMUNITY): Payer: Medicare Other

## 2016-08-11 ENCOUNTER — Encounter (HOSPITAL_COMMUNITY)
Admission: RE | Admit: 2016-08-11 | Discharge: 2016-08-11 | Disposition: A | Discharge status: Home / Self Care | Payer: Medicare Other | Source: Ambulatory Visit | Attending: Cardiovascular Disease | Admitting: Cardiovascular Disease

## 2016-08-11 DIAGNOSIS — Z87442 Personal history of urinary calculi: Secondary | ICD-10-CM | POA: Diagnosis not present

## 2016-08-11 DIAGNOSIS — Z7982 Long term (current) use of aspirin: Secondary | ICD-10-CM | POA: Insufficient documentation

## 2016-08-11 DIAGNOSIS — Z87891 Personal history of nicotine dependence: Secondary | ICD-10-CM | POA: Insufficient documentation

## 2016-08-11 DIAGNOSIS — E785 Hyperlipidemia, unspecified: Secondary | ICD-10-CM | POA: Diagnosis not present

## 2016-08-11 DIAGNOSIS — Z79899 Other long term (current) drug therapy: Secondary | ICD-10-CM | POA: Diagnosis not present

## 2016-08-11 DIAGNOSIS — I2510 Atherosclerotic heart disease of native coronary artery without angina pectoris: Secondary | ICD-10-CM | POA: Diagnosis not present

## 2016-08-11 DIAGNOSIS — Z7902 Long term (current) use of antithrombotics/antiplatelets: Secondary | ICD-10-CM | POA: Diagnosis not present

## 2016-08-11 DIAGNOSIS — Z955 Presence of coronary angioplasty implant and graft: Secondary | ICD-10-CM | POA: Diagnosis present

## 2016-08-11 DIAGNOSIS — M1990 Unspecified osteoarthritis, unspecified site: Secondary | ICD-10-CM | POA: Diagnosis not present

## 2016-08-13 ENCOUNTER — Encounter (HOSPITAL_COMMUNITY): Admission: RE | Admit: 2016-08-13 | Payer: Medicare Other | Source: Ambulatory Visit

## 2016-08-13 ENCOUNTER — Encounter (HOSPITAL_COMMUNITY): Payer: Medicare Other

## 2016-08-16 ENCOUNTER — Encounter (HOSPITAL_COMMUNITY)
Admission: RE | Admit: 2016-08-16 | Discharge: 2016-08-16 | Disposition: A | Discharge status: Home / Self Care | Payer: Medicare Other | Source: Ambulatory Visit | Attending: Cardiovascular Disease | Admitting: Cardiovascular Disease

## 2016-08-16 ENCOUNTER — Encounter (HOSPITAL_COMMUNITY): Payer: Medicare Other

## 2016-08-16 DIAGNOSIS — Z955 Presence of coronary angioplasty implant and graft: Secondary | ICD-10-CM

## 2016-08-18 ENCOUNTER — Encounter (HOSPITAL_COMMUNITY): Payer: Medicare Other

## 2016-08-18 ENCOUNTER — Telehealth: Payer: Self-pay | Admitting: Cardiovascular Disease

## 2016-08-18 ENCOUNTER — Encounter (HOSPITAL_COMMUNITY)
Admission: RE | Admit: 2016-08-18 | Discharge: 2016-08-18 | Disposition: A | Discharge status: Home / Self Care | Payer: Medicare Other | Source: Ambulatory Visit | Attending: Cardiovascular Disease | Admitting: Cardiovascular Disease

## 2016-08-18 DIAGNOSIS — Z955 Presence of coronary angioplasty implant and graft: Secondary | ICD-10-CM

## 2016-08-18 NOTE — Telephone Encounter (Signed)
Patient advised that he is to be on DAPT for one year post PCI in June 2018. Patient verbalized understanding of plan.

## 2016-08-18 NOTE — Telephone Encounter (Signed)
New message    Pt daughter is calling stating that he is taking plavix and baby aspirin. She wants to know if pt is supposed to be taking both.

## 2016-08-20 ENCOUNTER — Encounter (HOSPITAL_COMMUNITY): Payer: Medicare Other

## 2016-08-20 ENCOUNTER — Encounter (HOSPITAL_COMMUNITY)
Admission: RE | Admit: 2016-08-20 | Discharge: 2016-08-20 | Disposition: A | Discharge status: Home / Self Care | Payer: Medicare Other | Source: Ambulatory Visit | Attending: Cardiovascular Disease | Admitting: Cardiovascular Disease

## 2016-08-20 DIAGNOSIS — Z955 Presence of coronary angioplasty implant and graft: Secondary | ICD-10-CM | POA: Diagnosis not present

## 2016-08-23 ENCOUNTER — Encounter (HOSPITAL_COMMUNITY)
Admission: RE | Admit: 2016-08-23 | Discharge: 2016-08-23 | Disposition: A | Discharge status: Home / Self Care | Payer: Medicare Other | Source: Ambulatory Visit | Attending: Cardiovascular Disease | Admitting: Cardiovascular Disease

## 2016-08-23 ENCOUNTER — Encounter (HOSPITAL_COMMUNITY): Payer: Medicare Other

## 2016-08-23 DIAGNOSIS — Z955 Presence of coronary angioplasty implant and graft: Secondary | ICD-10-CM

## 2016-08-23 NOTE — Progress Notes (Signed)
Cardiac Individual Treatment Plan  Patient Details  Name: Jesus Harmon MRN: [id number] Date of Birth: 28-Sep-1929 Referring Provider:     CARDIAC REHAB PHASE II ORIENTATION from 07/22/2016 in [place]  Referring Provider  Sherren Mocha       Initial Encounter Date:    CARDIAC REHAB PHASE II ORIENTATION from 07/22/2016 in [place]  Date  07/22/16  Referring Provider  Sherren Mocha       Visit Diagnosis: 06/16/16 Status post coronary artery stent placement  Patient's Home Medications on Admission:  Current Outpatient Prescriptions:  .  acetaminophen (TYLENOL) 500 MG chewable tablet, Chew 500 mg by mouth every 6 (six) hours as needed for pain., Disp: , Rfl:  .  aspirin EC 81 MG tablet, Take 81 mg by mouth daily., Disp: , Rfl:  .  clopidogrel (PLAVIX) 75 MG tablet, Take 1 tablet (75 mg total) by mouth daily with breakfast., Disp: 30 tablet, Rfl: 11 .  fexofenadine (ALLEGRA) 180 MG tablet, Take 180 mg by mouth daily., Disp: , Rfl:  .  latanoprost (XALATAN) 0.005 % ophthalmic solution, Place 1 drop into both eyes at bedtime. , Disp: , Rfl:  .  lisinopril (PRINIVIL,ZESTRIL) 10 MG tablet, Take 1 tablet (10 mg total) by mouth daily., Disp: 90 tablet, Rfl: 3 .  nitroGLYCERIN (NITROSTAT) 0.4 MG SL tablet, Place 1 tablet (0.4 mg total) under the tongue every 5 (five) minutes as needed for chest pain., Disp: 25 tablet, Rfl: 5 .  simvastatin (ZOCOR) 20 MG tablet, Take 1 tablet (20 mg total) by mouth daily., Disp: 90 tablet, Rfl: 3  Past Medical History: Past Medical History:  Diagnosis Date  . Arthritis    back and neck (06/16/2016)  . CAD (coronary artery disease)    a. LHC 10/2009:  pLAD 50%, small D1 70-80% (too small for PCI), EF 65% => med Rx  b. Myoview 10/2009: EF 65%, no ischemia;  c.  ETT-MV 3/14:  No ischemia, EF 63%  . History of kidney stones    "passed them"  . HLD (hyperlipidemia)    "on RX; never had high  cholestrol" (06/16/2016)  . HTN (hypertension)    "on RX; never HTN" (06/16/2016)  . Migraine    "in high school" (06/16/2016)    Tobacco Use: History  Smoking Status  . Former Smoker  . Packs/day: 1.00  . Years: 18.00  . Quit date: 01/12/1971  Smokeless Tobacco  . Never Used    Labs: Recent Review Flowsheet Data    Labs for ITP Cardiac and Pulmonary Rehab Latest Ref Rng & Units 10/26/2009 12/08/2009 11/06/2010   Cholestrol 0 - 200 mg/dL 175 ATP III CLASSIFICATION: <200     mg/dL   Desirable 200-239  mg/dL   Borderline High >=240    mg/dL   High 162 153   LDLCALC 0 - 99 mg/dL 94 Total Cholesterol/HDL:CHD Risk Coronary Heart Disease Risk Table Men   Women 1/2 Average Risk   3.4   3.3 Average Risk       5.0   4.4 2 X Average Risk   9.6   7.1 3 X Average Risk  23.4   11.0 Use the calculated Patient Ratio above and the CHD Risk Table to determine the patient's CHD Risk. ATP III CLASSIFICATION (LDL): <100     mg/dL   Optimal 100-129  mg/dL   Near or Above Optimal 130-159  mg/dL   Borderline 160-189  mg/dL  High >190     mg/dL   Very High 82 64   HDL >39.00 mg/dL 70 71.20 82.40   Trlycerides 0.0 - 149.0 mg/dL 55 44.0 34.0      Capillary Blood Glucose: No results found for: GLUCAP   Exercise Target Goals:    Exercise Program Goal: Individual exercise prescription set with THRR, safety & activity barriers. Participant demonstrates ability to understand and report RPE using BORG scale, to self-measure pulse accurately, and to acknowledge the importance of the exercise prescription.  Exercise Prescription Goal: Starting with aerobic activity 30 plus minutes a day, 3 days per week for initial exercise prescription. Provide home exercise prescription and guidelines that participant acknowledges understanding prior to discharge.  Activity Barriers & Risk Stratification:     Activity Barriers & Cardiac Risk Stratification - 07/22/16 0959      Activity Barriers & Cardiac  Risk Stratification   Activity Barriers Back Problems   Cardiac Risk Stratification Moderate      6 Minute Walk:     6 Minute Walk    Row Name 07/22/16 0901 07/22/16 1017       6 Minute Walk   Phase Initial  -    Distance  - 888 feet    Walk Time 6 minutes  -    # of Rest Breaks  - 0    MPH  - 1.68    METS  - 1.62    RPE  - 11    VO2 Peak  - 5.68    Symptoms  - No    Resting HR  - 69 bpm    Resting BP  - 144/70    Max Ex. HR  - 90 bpm    Max Ex. BP  - 142/76    2 Minute Post BP  - 122/58       Oxygen Initial Assessment:   Oxygen Re-Evaluation:   Oxygen Discharge (Final Oxygen Re-Evaluation):   Initial Exercise Prescription:     Initial Exercise Prescription - 07/22/16 1000      Date of Initial Exercise RX and Referring Provider   Date 07/22/16   Referring Provider Sherren Mocha      Treadmill   MPH 1.5   Grade 0   Minutes 10   METs 2.15     Recumbant Bike   Level 1.5   Minutes 10   METs 2     NuStep   Level 2   SPM 60   Minutes 10   METs 1.7     Prescription Details   Frequency (times per week) 3   Duration Progress to 30 minutes of continuous aerobic without signs/symptoms of physical distress     Intensity   THRR 40-80% of Max Heartrate 54-108   Ratings of Perceived Exertion 11-15   Perceived Dyspnea 0-4     Progression   Progression Continue to progress workloads to maintain intensity without signs/symptoms of physical distress.     Resistance Training   Training Prescription Yes   Weight 2lbs   Reps 10-15      Perform Capillary Blood Glucose checks as needed.  Exercise Prescription Changes:      Exercise Prescription Changes    Row Name 07/26/16 1500 08/09/16 1538 08/24/16 1500         Response to Exercise   Blood Pressure (Admit) 144/70 134/70 114/80     Blood Pressure (Exercise) 144/70 140/72 144/70     Blood Pressure (Exit) 130/80 142/62 118/78  Heart Rate (Admit) 64 bpm 68 bpm 71 bpm     Heart Rate  (Exercise) 93 bpm 118 bpm 108 bpm     Heart Rate (Exit) 66 bpm 67 bpm 70 bpm     Rating of Perceived Exertion (Exercise) 11 12 11      Symptoms none none none     Comments pt was oriented to exercise equipment on 07/26/16  - -     Duration Continue with 30 min of aerobic exercise without signs/symptoms of physical distress. Continue with 30 min of aerobic exercise without signs/symptoms of physical distress. Continue with 30 min of aerobic exercise without signs/symptoms of physical distress.     Intensity THRR unchanged THRR unchanged THRR unchanged       Progression   Progression Continue to progress workloads to maintain intensity without signs/symptoms of physical distress. Continue to progress workloads to maintain intensity without signs/symptoms of physical distress. Continue to progress workloads to maintain intensity without signs/symptoms of physical distress.     Average METs 2 2.7 2.9       Resistance Training   Training Prescription Yes Yes Yes     Weight 2lbs 2lbs 3lbs     Reps 10-15 10-15 10-15     Time 10 Minutes 10 Minutes 10 Minutes       Treadmill   MPH 1.5 2.3 2.3     Grade 0 0 0     Minutes 10 10 10      METs 2.15 2.76 2.76       Recumbant Bike   Level 1.5 2.5 3     Minutes 10 10 10      METs 1.8 2 2.3       NuStep   Level 2 3 3      SPM 60 80 80     Minutes 10 10 10      METs 1.8 2.8 3.5       Home Exercise Plan   Plans to continue exercise at  - Home (comment)  walking Home (comment)  walking     Frequency  - Add 3 additional days to program exercise sessions. Add 3 additional days to program exercise sessions.     Initial Home Exercises Provided  - 08/06/16 08/06/16        Exercise Comments:      Exercise Comments    Row Name 07/26/16 1502 08/23/16 1625         Exercise Comments Pt completed first exercise session today and reported no signs of SOB, CP or dizziness. Pt was oriented to exercise equipment on 07/26/16.  Reviewed METs and goals. Pt is  tolerating exercise very well; will continue to monitor exercise progression and activity levels.         Exercise Goals and Review:      Exercise Goals    Row Name 07/22/16 0959             Exercise Goals   Increase Physical Activity Yes       Intervention Provide advice, education, support and counseling about physical activity/exercise needs.;Develop an individualized exercise prescription for aerobic and resistive training based on initial evaluation findings, risk stratification, comorbidities and participant's personal goals.       Expected Outcomes Achievement of increased cardiorespiratory fitness and enhanced flexibility, muscular endurance and strength shown through measurements of functional capacity and personal statement of participant.       Increase Strength and Stamina Yes       Intervention Provide advice,  education, support and counseling about physical activity/exercise needs.;Develop an individualized exercise prescription for aerobic and resistive training based on initial evaluation findings, risk stratification, comorbidities and participant's personal goals.       Expected Outcomes Achievement of increased cardiorespiratory fitness and enhanced flexibility, muscular endurance and strength shown through measurements of functional capacity and personal statement of participant.          Exercise Goals Re-Evaluation :     Exercise Goals Re-Evaluation    [place] Name 08/06/16 1507 08/23/16 1626           Exercise Goal Re-Evaluation   Exercise Goals Review Increase Physical Activity;Increase Strenth and Stamina Increase Physical Activity;Increase Strenth and Stamina      Comments Reviewed home exercise with pt today.  Pt plans to walk for exercise, 3x/week in addition to coming to cardiac rehab.  Reviewed THR, pulse, RPE, sign and symptoms, NTG use, and when to call 911 or MD.  Also discussed weather considerations and indoor options.  Pt voiced understanding. Pt is  walking for 30 minutes 2x/week in addition to coming to cardiac rehab. Pt stated feeling stronger since starting the program.       Expected Outcomes Pt will be compliant with HEP and improve in cardiorespiratory fitness Pt will be compliant with HEP and improve in cardiorespiratory fitness          Discharge Exercise Prescription (Final Exercise Prescription Changes):     Exercise Prescription Changes - 08/24/16 1500      Response to Exercise   Blood Pressure (Admit) 114/80   Blood Pressure (Exercise) 144/70   Blood Pressure (Exit) 118/78   Heart Rate (Admit) 71 bpm   Heart Rate (Exercise) 108 bpm   Heart Rate (Exit) 70 bpm   Rating of Perceived Exertion (Exercise) 11   Symptoms none   Comments --   Duration Continue with 30 min of aerobic exercise without signs/symptoms of physical distress.   Intensity THRR unchanged     Progression   Progression Continue to progress workloads to maintain intensity without signs/symptoms of physical distress.   Average METs 2.9     Resistance Training   Training Prescription Yes   Weight 3lbs   Reps 10-15   Time 10 Minutes     Treadmill   MPH 2.3   Grade 0   Minutes 10   METs 2.76     Recumbant Bike   Level 3   Minutes 10   METs 2.3     NuStep   Level 3   SPM 80   Minutes 10   METs 3.5     Home Exercise Plan   Plans to continue exercise at Home (comment)  walking   Frequency Add 3 additional days to program exercise sessions.   Initial Home Exercises Provided 08/06/16      Nutrition:  Target Goals: Understanding of nutrition guidelines, daily intake of sodium 1500mg , cholesterol 200mg , calories 30% from fat and 7% or less from saturated fats, daily to have 5 or more servings of fruits and vegetables.  Biometrics:     Pre Biometrics - 07/22/16 1017      Pre Biometrics   Waist Circumference 38 inches   Hip Circumference 38 inches   Waist to Hip Ratio 1 %   Triceps Skinfold 18 mm   % Body Fat 26.3 %   Grip  Strength 41 kg   Flexibility 0 in   Single Leg Stand 0 seconds  Nutrition Therapy Plan and Nutrition Goals:     Nutrition Therapy & Goals - 07/22/16 1229      Nutrition Therapy   Diet Therapeutic Lifestyle Changes     Intervention Plan   Intervention Prescribe, educate and counsel regarding individualized specific dietary modifications aiming towards targeted core components such as weight, hypertension, lipid management, diabetes, heart failure and other comorbidities.   Expected Outcomes Short Term Goal: Understand basic principles of dietary content, such as calories, fat, sodium, cholesterol and nutrients.;Long Term Goal: Adherence to prescribed nutrition plan.      Nutrition Discharge: Nutrition Scores:     Nutrition Assessments - 07/22/16 1223      MEDFICTS Scores   Pre Score 67      Nutrition Goals Re-Evaluation:   Nutrition Goals Re-Evaluation:   Nutrition Goals Discharge (Final Nutrition Goals Re-Evaluation):   Psychosocial: Target Goals: Acknowledge presence or absence of significant depression and/or stress, maximize coping skills, provide positive support system. Participant is able to verbalize types and ability to use techniques and skills needed for reducing stress and depression.  Initial Review & Psychosocial Screening:     Initial Psych Review & Screening - 07/22/16 1328      Initial Review   Current issues with None Identified     Family Dynamics   Good Support System? Yes     Barriers   Psychosocial barriers to participate in program The patient should benefit from training in stress management and relaxation.     Screening Interventions   Interventions Encouraged to exercise      Quality of Life Scores:     Quality of Life - 07/22/16 1018      Quality of Life Scores   Health/Function Pre 27.54 %   Socioeconomic Pre 26.29 %   Psych/Spiritual Pre 27.36 %   Family Pre 30 %   GLOBAL Post 27.61 %      PHQ-9: Recent  Review Flowsheet Data    Depression screen [facility] 2/9 07/26/2016   Decreased Interest 0   Down, Depressed, Hopeless 0   PHQ - 2 Score 0     Interpretation of Total Score  Total Score Depression Severity:  1-4 = Minimal depression, 5-9 = Mild depression, 10-14 = Moderate depression, 15-19 = Moderately severe depression, 20-27 = Severe depression   Psychosocial Evaluation and Intervention:     Psychosocial Evaluation - 08/23/16 1241      Psychosocial Evaluation & Interventions   Interventions Encouraged to exercise with the program and follow exercise prescription;Stress management education;Relaxation education   Continue Psychosocial Services  Follow up required by staff      Psychosocial Re-Evaluation:     Psychosocial Re-Evaluation    [place] Name 08/23/16 1242             Psychosocial Re-Evaluation   Current issues with None Identified       Interventions Stress management education;Encouraged to attend Cardiac Rehabilitation for the exercise;Relaxation education       Continue Psychosocial Services  Follow up required by staff          Psychosocial Discharge (Final Psychosocial Re-Evaluation):     Psychosocial Re-Evaluation - 08/23/16 1242      Psychosocial Re-Evaluation   Current issues with None Identified   Interventions Stress management education;Encouraged to attend Cardiac Rehabilitation for the exercise;Relaxation education   Continue Psychosocial Services  Follow up required by staff      Vocational Rehabilitation: Provide vocational rehab assistance to qualifying candidates.   Vocational Rehab  Evaluation & Intervention:     Vocational Rehab - 07/22/16 1329      Initial Vocational Rehab Evaluation & Intervention   Assessment shows need for Vocational Rehabilitation No  Pt does not plan to return to competive employment      Education: Education Goals: Education classes will be provided on a weekly basis, covering required topics. Participant will  state understanding/return demonstration of topics presented.  Learning Barriers/Preferences:     Learning Barriers/Preferences - 07/22/16 0908      Learning Barriers/Preferences   Learning Barriers None   Learning Preferences Written Material;Skilled Demonstration;Audio      Education Topics: Count Your Pulse:  -Group instruction provided by verbal instruction, demonstration, patient participation and written materials to support subject.  Instructors address importance of being able to find your pulse and how to count your pulse when at home without a heart monitor.  Patients get hands on experience counting their pulse with staff help and individually.   Heart Attack, Angina, and Risk Factor Modification:  -Group instruction provided by verbal instruction, video, and written materials to support subject.  Instructors address signs and symptoms of angina and heart attacks.    Also discuss risk factors for heart disease and how to make changes to improve heart health risk factors.   CARDIAC REHAB PHASE II EXERCISE from 08/20/2016 in [place]  Date  08/04/16  Instruction Review Code  2- meets goals/outcomes      Functional Fitness:  -Group instruction provided by verbal instruction, demonstration, patient participation, and written materials to support subject.  Instructors address safety measures for doing things around the house.  Discuss how to get up and down off the floor, how to pick things up properly, how to safely get out of a chair without assistance, and balance training.   CARDIAC REHAB PHASE II EXERCISE from 08/20/2016 in [place]  Date  07/30/16  Instruction Review Code  2- meets goals/outcomes      Meditation and Mindfulness:  -Group instruction provided by verbal instruction, patient participation, and written materials to support subject.  Instructor addresses importance of mindfulness and meditation  practice to help reduce stress and improve awareness.  Instructor also leads participants through a meditation exercise.    Stretching for Flexibility and Mobility:  -Group instruction provided by verbal instruction, patient participation, and written materials to support subject.  Instructors lead participants through series of stretches that are designed to increase flexibility thus improving mobility.  These stretches are additional exercise for major muscle groups that are typically performed during regular warm up and cool down.   CARDIAC REHAB PHASE II EXERCISE from 08/20/2016 in [place]  Date  08/06/16  Instruction Review Code  2- meets goals/outcomes      Hands Only CPR:  -Group verbal, video, and participation provides a basic overview of AHA guidelines for community CPR. Role-play of emergencies allow participants the opportunity to practice calling for help and chest compression technique with discussion of AED use.   Hypertension: -Group verbal and written instruction that provides a basic overview of hypertension including the most recent diagnostic guidelines, risk factor reduction with self-care instructions and medication management.   CARDIAC REHAB PHASE II EXERCISE from 08/20/2016 in [place]  Date  08/20/16  Instruction Review Code  2- meets goals/outcomes       Nutrition I class: Heart Healthy Eating:  -Group instruction provided by PowerPoint  slides, verbal discussion, and written materials to support subject matter. The instructor gives an explanation and review of the Therapeutic Lifestyle Changes diet recommendations, which includes a discussion on lipid goals, dietary fat, sodium, fiber, plant stanol/sterol esters, sugar, and the components of a well-balanced, healthy diet.   Nutrition II class: Lifestyle Skills:  -Group instruction provided by PowerPoint slides, verbal discussion, and written  materials to support subject matter. The instructor gives an explanation and review of label reading, grocery shopping for heart health, heart healthy recipe modifications, and ways to make healthier choices when eating out.   Diabetes Question & Answer:  -Group instruction provided by PowerPoint slides, verbal discussion, and written materials to support subject matter. The instructor gives an explanation and review of diabetes co-morbidities, pre- and post-prandial blood glucose goals, pre-exercise blood glucose goals, signs, symptoms, and treatment of hypoglycemia and hyperglycemia, and foot care basics.   Diabetes Blitz:  -Group instruction provided by PowerPoint slides, verbal discussion, and written materials to support subject matter. The instructor gives an explanation and review of the physiology behind type 1 and type 2 diabetes, diabetes medications and rational behind using different medications, pre- and post-prandial blood glucose recommendations and Hemoglobin A1c goals, diabetes diet, and exercise including blood glucose guidelines for exercising safely.    Portion Distortion:  -Group instruction provided by PowerPoint slides, verbal discussion, written materials, and food models to support subject matter. The instructor gives an explanation of serving size versus portion size, changes in portions sizes over the last 20 years, and what consists of a serving from each food group.   Stress Management:  -Group instruction provided by verbal instruction, video, and written materials to support subject matter.  Instructors review role of stress in heart disease and how to cope with stress positively.     Exercising on Your Own:  -Group instruction provided by verbal instruction, power point, and written materials to support subject.  Instructors discuss benefits of exercise, components of exercise, frequency and intensity of exercise, and end points for exercise.  Also discuss use of  nitroglycerin and activating EMS.  Review options of places to exercise outside of rehab.  Review guidelines for sex with heart disease.   CARDIAC REHAB PHASE II EXERCISE from 08/20/2016 in [place]  Date  07/28/16  Instruction Review Code  2- meets goals/outcomes      Cardiac Drugs I:  -Group instruction provided by verbal instruction and written materials to support subject.  Instructor reviews cardiac drug classes: antiplatelets, anticoagulants, beta blockers, and statins.  Instructor discusses reasons, side effects, and lifestyle considerations for each drug class.   CARDIAC REHAB PHASE II EXERCISE from 08/20/2016 in [place]  Date  08/18/16  Instruction Review Code  2- meets goals/outcomes      Cardiac Drugs II:  -Group instruction provided by verbal instruction and written materials to support subject.  Instructor reviews cardiac drug classes: angiotensin converting enzyme inhibitors (ACE-I), angiotensin II receptor blockers (ARBs), nitrates, and calcium channel blockers.  Instructor discusses reasons, side effects, and lifestyle considerations for each drug class.   Anatomy and Physiology of the Circulatory System:  Group verbal and written instruction and models provide basic cardiac anatomy and physiology, with the coronary electrical and arterial systems. Review of: AMI, Angina, Valve disease, Heart Failure, Peripheral Artery Disease, Cardiac Arrhythmia, Pacemakers, and the ICD.   Other Education:  -Group or individual verbal, written, or video instructions that support the educational goals of  the cardiac rehab program.   Knowledge Questionnaire Score:     Knowledge Questionnaire Score - 07/22/16 1016      Knowledge Questionnaire Score   Pre Score 18/24      Core Components/Risk Factors/Patient Goals at Admission:     Personal Goals and Risk Factors at Admission - 07/22/16 1018      Core  Components/Risk Factors/Patient Goals on Admission   Hypertension Yes   Intervention Provide education on lifestyle modifcations including regular physical activity/exercise, weight management, moderate sodium restriction and increased consumption of fresh fruit, vegetables, and low fat dairy, alcohol moderation, and smoking cessation.;Monitor prescription use compliance.   Expected Outcomes Short Term: Continued assessment and intervention until BP is < 140/48mm HG in hypertensive participants. < 130/25mm HG in hypertensive participants with diabetes, heart failure or chronic kidney disease.;Long Term: Maintenance of blood pressure at goal levels.   Lipids Yes   Intervention Provide education and support for participant on nutrition & aerobic/resistive exercise along with prescribed medications to achieve LDL 70mg , HDL >40mg .   Expected Outcomes Short Term: Participant states understanding of desired cholesterol values and is compliant with medications prescribed. Participant is following exercise prescription and nutrition guidelines.;Long Term: Cholesterol controlled with medications as prescribed, with individualized exercise RX and with personalized nutrition plan. Value goals: LDL < 70mg , HDL > 40 mg.      Core Components/Risk Factors/Patient Goals Review:      Goals and Risk Factor Review    Row Name 08/23/16 1233             Core Components/Risk Factors/Patient Goals Review   Personal Goals Review Lipids;Hypertension       Review Pt is making progress toward meeting his goals.  Pt bp readings have been within normal limits.  Pt is expecting blood work to [facility] the efficacy of his medicaitons, diet and exercise for lowering his total cholesterol.       Expected Outcomes Pt bp and lipid panel will contineu to show improvement or be at goal.          Core Components/Risk Factors/Patient Goals at Discharge (Final Review):      Goals and Risk Factor Review - 08/23/16 1233       Core Components/Risk Factors/Patient Goals Review   Personal Goals Review Lipids;Hypertension   Review Pt is making progress toward meeting his goals.  Pt bp readings have been within normal limits.  Pt is expecting blood work to [facility] the efficacy of his medicaitons, diet and exercise for lowering his total cholesterol.   Expected Outcomes Pt bp and lipid panel will contineu to show improvement or be at goal.      ITP Comments:     ITP Comments    Row Name 07/22/16 0843           ITP Comments Medical Director, Dr. Fransico Him          Comments:  Chick is making expected progress toward personal goals after completing 13 sessions. Psychosocial Assessment - No psychosocial needs identified at this time, no psychosocial interventions necessary. Pt enjoys wood working. Pt feels supportive in his rehab participation.  Recommend continued exercise and life style modification education including  stress management and relaxation techniques to decrease cardiac risk profile. Cherre Huger, BSN Cardiac and Training and development officer

## 2016-08-25 ENCOUNTER — Encounter (HOSPITAL_COMMUNITY): Payer: Medicare Other

## 2016-08-25 ENCOUNTER — Encounter (HOSPITAL_COMMUNITY)
Admission: RE | Admit: 2016-08-25 | Discharge: 2016-08-25 | Disposition: A | Discharge status: Home / Self Care | Payer: Medicare Other | Source: Ambulatory Visit | Attending: Cardiovascular Disease | Admitting: Cardiovascular Disease

## 2016-08-25 DIAGNOSIS — Z955 Presence of coronary angioplasty implant and graft: Secondary | ICD-10-CM | POA: Diagnosis not present

## 2016-08-27 ENCOUNTER — Encounter (HOSPITAL_COMMUNITY): Payer: Medicare Other

## 2016-08-27 ENCOUNTER — Encounter (HOSPITAL_COMMUNITY)
Admission: RE | Admit: 2016-08-27 | Discharge: 2016-08-27 | Disposition: A | Discharge status: Home / Self Care | Payer: Medicare Other | Source: Ambulatory Visit | Attending: Cardiovascular Disease | Admitting: Cardiovascular Disease

## 2016-08-27 DIAGNOSIS — Z955 Presence of coronary angioplasty implant and graft: Secondary | ICD-10-CM | POA: Diagnosis not present

## 2016-08-30 ENCOUNTER — Encounter (HOSPITAL_COMMUNITY): Payer: Medicare Other

## 2016-08-30 ENCOUNTER — Encounter (HOSPITAL_COMMUNITY)
Admission: RE | Admit: 2016-08-30 | Discharge: 2016-08-30 | Disposition: A | Discharge status: Home / Self Care | Payer: Medicare Other | Source: Ambulatory Visit | Attending: Cardiovascular Disease | Admitting: Cardiovascular Disease

## 2016-08-30 DIAGNOSIS — Z955 Presence of coronary angioplasty implant and graft: Secondary | ICD-10-CM

## 2016-09-01 ENCOUNTER — Encounter (HOSPITAL_COMMUNITY): Payer: Medicare Other

## 2016-09-01 ENCOUNTER — Encounter (HOSPITAL_COMMUNITY)
Admission: RE | Admit: 2016-09-01 | Discharge: 2016-09-01 | Disposition: A | Discharge status: Home / Self Care | Payer: Medicare Other | Source: Ambulatory Visit | Attending: Cardiovascular Disease | Admitting: Cardiovascular Disease

## 2016-09-01 DIAGNOSIS — Z955 Presence of coronary angioplasty implant and graft: Secondary | ICD-10-CM | POA: Diagnosis not present

## 2016-09-03 ENCOUNTER — Encounter (HOSPITAL_COMMUNITY): Payer: Medicare Other

## 2016-09-03 ENCOUNTER — Encounter (HOSPITAL_COMMUNITY)
Admission: RE | Admit: 2016-09-03 | Discharge: 2016-09-03 | Disposition: A | Discharge status: Home / Self Care | Payer: Medicare Other | Source: Ambulatory Visit | Attending: Cardiovascular Disease | Admitting: Cardiovascular Disease

## 2016-09-03 DIAGNOSIS — Z955 Presence of coronary angioplasty implant and graft: Secondary | ICD-10-CM | POA: Diagnosis not present

## 2016-09-06 ENCOUNTER — Encounter (HOSPITAL_COMMUNITY): Payer: Medicare Other

## 2016-09-06 ENCOUNTER — Encounter (HOSPITAL_COMMUNITY)
Admission: RE | Admit: 2016-09-06 | Discharge: 2016-09-06 | Disposition: A | Discharge status: Home / Self Care | Payer: Medicare Other | Source: Ambulatory Visit | Attending: Cardiovascular Disease | Admitting: Cardiovascular Disease

## 2016-09-06 DIAGNOSIS — Z955 Presence of coronary angioplasty implant and graft: Secondary | ICD-10-CM

## 2016-09-08 ENCOUNTER — Encounter (HOSPITAL_COMMUNITY): Payer: Medicare Other

## 2016-09-08 ENCOUNTER — Encounter (HOSPITAL_COMMUNITY)
Admission: RE | Admit: 2016-09-08 | Discharge: 2016-09-08 | Disposition: A | Discharge status: Home / Self Care | Payer: Medicare Other | Source: Ambulatory Visit | Attending: Cardiovascular Disease | Admitting: Cardiovascular Disease

## 2016-09-08 DIAGNOSIS — Z955 Presence of coronary angioplasty implant and graft: Secondary | ICD-10-CM | POA: Diagnosis not present

## 2016-09-10 ENCOUNTER — Encounter (HOSPITAL_COMMUNITY)
Admission: RE | Admit: 2016-09-10 | Discharge: 2016-09-10 | Disposition: A | Discharge status: Home / Self Care | Payer: Medicare Other | Source: Ambulatory Visit | Attending: Cardiovascular Disease | Admitting: Cardiovascular Disease

## 2016-09-10 ENCOUNTER — Encounter (HOSPITAL_COMMUNITY): Payer: Medicare Other

## 2016-09-10 DIAGNOSIS — Z955 Presence of coronary angioplasty implant and graft: Secondary | ICD-10-CM

## 2016-09-10 NOTE — Progress Notes (Signed)
[name] 83 y.o. male       Nutrition Note  1. 06/16/16 Status post coronary artery stent placement    Nutrition Note Spoke with pt. Pt with h/o slow, chronic wt loss over the past 7 years. Pt states he has maintained his wt since starting rehab. Age-appropriate nutrition recommendations reviewed. Pt continues to have no nutrition goals he wants to work on at this time. Pt aware of nutrition education classes offered and is not interested in attending nutrition classes or receiving nutrition class information at this time.   Nutrition Diagnosis Food-and nutrition-related knowledge deficit related to lack of exposure to information as related to diagnosis of: ? CVD   Nutrition Intervention ? Pt's individual nutrition plan reviewed with pt.   Nutrition Goal(s):  Pt does not want to set any nutrition goals at this time  Plan:  Pt to attend nutrition classes ? Portion Distortion  Will provide client-centered nutrition education as part of interdisciplinary care.   Monitor and evaluate progress toward nutrition goal with team.  Derek Mound, M.Ed, RD, LDN, CDE 09/10/2016 12:17 PM

## 2016-09-14 MED FILL — CLOPIDOGREL 75 MG TABLET: 75 | 30 days supply | Qty: 30 | Fill #3

## 2016-09-14 MED FILL — LATANOPROST 0.005% EYE DRP: 0.005 | 25 days supply | Qty: 3 | Fill #1

## 2016-09-15 ENCOUNTER — Encounter (HOSPITAL_COMMUNITY): Payer: Medicare Other

## 2016-09-15 ENCOUNTER — Encounter (HOSPITAL_COMMUNITY)
Admission: RE | Admit: 2016-09-15 | Discharge: 2016-09-15 | Disposition: A | Discharge status: Home / Self Care | Payer: Medicare Other | Source: Ambulatory Visit | Attending: Cardiovascular Disease | Admitting: Cardiovascular Disease

## 2016-09-15 DIAGNOSIS — Z79899 Other long term (current) drug therapy: Secondary | ICD-10-CM | POA: Diagnosis not present

## 2016-09-15 DIAGNOSIS — Z87442 Personal history of urinary calculi: Secondary | ICD-10-CM | POA: Insufficient documentation

## 2016-09-15 DIAGNOSIS — Z87891 Personal history of nicotine dependence: Secondary | ICD-10-CM | POA: Diagnosis not present

## 2016-09-15 DIAGNOSIS — E785 Hyperlipidemia, unspecified: Secondary | ICD-10-CM | POA: Diagnosis not present

## 2016-09-15 DIAGNOSIS — M1990 Unspecified osteoarthritis, unspecified site: Secondary | ICD-10-CM | POA: Insufficient documentation

## 2016-09-15 DIAGNOSIS — Z7902 Long term (current) use of antithrombotics/antiplatelets: Secondary | ICD-10-CM | POA: Insufficient documentation

## 2016-09-15 DIAGNOSIS — Z955 Presence of coronary angioplasty implant and graft: Secondary | ICD-10-CM | POA: Diagnosis present

## 2016-09-15 DIAGNOSIS — I2510 Atherosclerotic heart disease of native coronary artery without angina pectoris: Secondary | ICD-10-CM | POA: Insufficient documentation

## 2016-09-15 DIAGNOSIS — Z7982 Long term (current) use of aspirin: Secondary | ICD-10-CM | POA: Insufficient documentation

## 2016-09-17 ENCOUNTER — Encounter (HOSPITAL_COMMUNITY)
Admission: RE | Admit: 2016-09-17 | Discharge: 2016-09-17 | Disposition: A | Discharge status: Home / Self Care | Payer: Medicare Other | Source: Ambulatory Visit | Attending: Cardiovascular Disease | Admitting: Cardiovascular Disease

## 2016-09-17 ENCOUNTER — Encounter (HOSPITAL_COMMUNITY): Payer: Medicare Other

## 2016-09-17 DIAGNOSIS — Z955 Presence of coronary angioplasty implant and graft: Secondary | ICD-10-CM

## 2016-09-20 ENCOUNTER — Encounter (HOSPITAL_COMMUNITY): Payer: Medicare Other

## 2016-09-20 ENCOUNTER — Encounter (HOSPITAL_COMMUNITY)
Admission: RE | Admit: 2016-09-20 | Discharge: 2016-09-20 | Disposition: A | Discharge status: Home / Self Care | Payer: Medicare Other | Source: Ambulatory Visit | Attending: Cardiovascular Disease | Admitting: Cardiovascular Disease

## 2016-09-20 DIAGNOSIS — Z955 Presence of coronary angioplasty implant and graft: Secondary | ICD-10-CM

## 2016-09-20 MED FILL — LISINOPRIL 10 MG TABLET: 10 | 90 days supply | Qty: 90 | Fill #2

## 2016-09-21 NOTE — Progress Notes (Signed)
Cardiac Individual Treatment Plan  Patient Details  Name: Jesus Harmon MRN: [id number] Date of Birth: 28-Sep-1929 Referring Provider:     CARDIAC REHAB PHASE II ORIENTATION from 07/22/2016 in [place]  Referring Provider  Sherren Mocha       Initial Encounter Date:    CARDIAC REHAB PHASE II ORIENTATION from 07/22/2016 in [place]  Date  07/22/16  Referring Provider  Sherren Mocha       Visit Diagnosis: 06/16/16 Status post coronary artery stent placement  Patient's Home Medications on Admission:  Current Outpatient Prescriptions:  .  acetaminophen (TYLENOL) 500 MG chewable tablet, Chew 500 mg by mouth every 6 (six) hours as needed for pain., Disp: , Rfl:  .  aspirin EC 81 MG tablet, Take 81 mg by mouth daily., Disp: , Rfl:  .  clopidogrel (PLAVIX) 75 MG tablet, Take 1 tablet (75 mg total) by mouth daily with breakfast., Disp: 30 tablet, Rfl: 11 .  fexofenadine (ALLEGRA) 180 MG tablet, Take 180 mg by mouth daily., Disp: , Rfl:  .  latanoprost (XALATAN) 0.005 % ophthalmic solution, Place 1 drop into both eyes at bedtime. , Disp: , Rfl:  .  lisinopril (PRINIVIL,ZESTRIL) 10 MG tablet, Take 1 tablet (10 mg total) by mouth daily., Disp: 90 tablet, Rfl: 3 .  nitroGLYCERIN (NITROSTAT) 0.4 MG SL tablet, Place 1 tablet (0.4 mg total) under the tongue every 5 (five) minutes as needed for chest pain., Disp: 25 tablet, Rfl: 5 .  simvastatin (ZOCOR) 20 MG tablet, Take 1 tablet (20 mg total) by mouth daily., Disp: 90 tablet, Rfl: 3  Past Medical History: Past Medical History:  Diagnosis Date  . Arthritis    back and neck (06/16/2016)  . CAD (coronary artery disease)    a. LHC 10/2009:  pLAD 50%, small D1 70-80% (too small for PCI), EF 65% => med Rx  b. Myoview 10/2009: EF 65%, no ischemia;  c.  ETT-MV 3/14:  No ischemia, EF 63%  . History of kidney stones    "passed them"  . HLD (hyperlipidemia)    "on RX; never had high  cholestrol" (06/16/2016)  . HTN (hypertension)    "on RX; never HTN" (06/16/2016)  . Migraine    "in high school" (06/16/2016)    Tobacco Use: History  Smoking Status  . Former Smoker  . Packs/day: 1.00  . Years: 18.00  . Quit date: 01/12/1971  Smokeless Tobacco  . Never Used    Labs: Recent Review Flowsheet Data    Labs for ITP Cardiac and Pulmonary Rehab Latest Ref Rng & Units 10/26/2009 12/08/2009 11/06/2010   Cholestrol 0 - 200 mg/dL 175 ATP III CLASSIFICATION: <200     mg/dL   Desirable 200-239  mg/dL   Borderline High >=240    mg/dL   High 162 153   LDLCALC 0 - 99 mg/dL 94 Total Cholesterol/HDL:CHD Risk Coronary Heart Disease Risk Table Men   Women 1/2 Average Risk   3.4   3.3 Average Risk       5.0   4.4 2 X Average Risk   9.6   7.1 3 X Average Risk  23.4   11.0 Use the calculated Patient Ratio above and the CHD Risk Table to determine the patient's CHD Risk. ATP III CLASSIFICATION (LDL): <100     mg/dL   Optimal 100-129  mg/dL   Near or Above Optimal 130-159  mg/dL   Borderline 160-189  mg/dL  High >190     mg/dL   Very High 82 64   HDL >39.00 mg/dL 70 71.20 82.40   Trlycerides 0.0 - 149.0 mg/dL 55 44.0 34.0      Capillary Blood Glucose: No results found for: GLUCAP   Exercise Target Goals:    Exercise Program Goal: Individual exercise prescription set with THRR, safety & activity barriers. Participant demonstrates ability to understand and report RPE using BORG scale, to self-measure pulse accurately, and to acknowledge the importance of the exercise prescription.  Exercise Prescription Goal: Starting with aerobic activity 30 plus minutes a day, 3 days per week for initial exercise prescription. Provide home exercise prescription and guidelines that participant acknowledges understanding prior to discharge.  Activity Barriers & Risk Stratification:     Activity Barriers & Cardiac Risk Stratification - 07/22/16 0959      Activity Barriers & Cardiac  Risk Stratification   Activity Barriers Back Problems   Cardiac Risk Stratification Moderate      6 Minute Walk:     6 Minute Walk    Row Name 07/22/16 0901 07/22/16 1017       6 Minute Walk   Phase Initial  -    Distance  - 888 feet    Walk Time 6 minutes  -    # of Rest Breaks  - 0    MPH  - 1.68    METS  - 1.62    RPE  - 11    VO2 Peak  - 5.68    Symptoms  - No    Resting HR  - 69 bpm    Resting BP  - 144/70    Max Ex. HR  - 90 bpm    Max Ex. BP  - 142/76    2 Minute Post BP  - 122/58       Oxygen Initial Assessment:   Oxygen Re-Evaluation:   Oxygen Discharge (Final Oxygen Re-Evaluation):   Initial Exercise Prescription:     Initial Exercise Prescription - 07/22/16 1000      Date of Initial Exercise RX and Referring Provider   Date 07/22/16   Referring Provider Sherren Mocha      Treadmill   MPH 1.5   Grade 0   Minutes 10   METs 2.15     Recumbant Bike   Level 1.5   Minutes 10   METs 2     NuStep   Level 2   SPM 60   Minutes 10   METs 1.7     Prescription Details   Frequency (times per week) 3   Duration Progress to 30 minutes of continuous aerobic without signs/symptoms of physical distress     Intensity   THRR 40-80% of Max Heartrate 54-108   Ratings of Perceived Exertion 11-15   Perceived Dyspnea 0-4     Progression   Progression Continue to progress workloads to maintain intensity without signs/symptoms of physical distress.     Resistance Training   Training Prescription Yes   Weight 2lbs   Reps 10-15      Perform Capillary Blood Glucose checks as needed.  Exercise Prescription Changes:     Exercise Prescription Changes    Row Name 07/26/16 1500 08/09/16 1538 08/24/16 1500 09/10/16 1229 09/16/16 1200     Response to Exercise   Blood Pressure (Admit) 144/70 134/70 114/80 114/78 154/64   Blood Pressure (Exercise) 144/70 140/72 144/70 158/60 150/60   Blood Pressure (Exit) 130/80 142/62 118/78 130/80 134/68  Heart  Rate (Admit) 64 bpm 68 bpm 71 bpm 100 bpm 88 bpm   Heart Rate (Exercise) 93 bpm 118 bpm 108 bpm 109 bpm 117 bpm   Heart Rate (Exit) 66 bpm 67 bpm 70 bpm 89 bpm 84 bpm   Rating of Perceived Exertion (Exercise) 11 12 11 11 12    Symptoms none none none none none   Comments pt was oriented to exercise equipment on 07/26/16  - -  - pt was instructed to slow down on RPM's and speed due to elevated BP   Duration Continue with 30 min of aerobic exercise without signs/symptoms of physical distress. Continue with 30 min of aerobic exercise without signs/symptoms of physical distress. Continue with 30 min of aerobic exercise without signs/symptoms of physical distress. Continue with 30 min of aerobic exercise without signs/symptoms of physical distress. Continue with 30 min of aerobic exercise without signs/symptoms of physical distress.   Intensity THRR unchanged THRR unchanged THRR unchanged THRR unchanged THRR unchanged     Progression   Progression Continue to progress workloads to maintain intensity without signs/symptoms of physical distress. Continue to progress workloads to maintain intensity without signs/symptoms of physical distress. Continue to progress workloads to maintain intensity without signs/symptoms of physical distress. Continue to progress workloads to maintain intensity without signs/symptoms of physical distress. Continue to progress workloads to maintain intensity without signs/symptoms of physical distress.   Average METs 2 2.7 2.9 2.9 3     Resistance Training   Training Prescription Yes Yes Yes Yes Yes   Weight 2lbs 2lbs 3lbs 3lbs 3lbs   Reps 10-15 10-15 10-15 10-15 10-15   Time 10 Minutes 10 Minutes 10 Minutes 10 Minutes 10 Minutes     Treadmill   MPH 1.5 2.3 2.3 2.5 2.5   Grade 0 0 0 0 0   Minutes 10 10 10 10 10    METs 2.15 2.76 2.76 2.91 2.91     Recumbant Bike   Level 1.5 2.5 3 3 3    Minutes 10 10 10 10 10    METs 1.8 2 2.3 2.2 2.2     NuStep   Level 2 3 3 4 4    SPM  60 80 80 80 80   Minutes 10 10 10 10 10    METs 1.8 2.8 3.5 3.5 3.9     Home Exercise Plan   Plans to continue exercise at  - Home (comment)  walking Home (comment)  walking Home (comment)  walking Home (comment)  walking   Frequency  - Add 3 additional days to program exercise sessions. Add 3 additional days to program exercise sessions. Add 3 additional days to program exercise sessions. Add 3 additional days to program exercise sessions.   Initial Home Exercises Provided  - 08/06/16 08/06/16 08/06/16 08/06/16      Exercise Comments:     Exercise Comments    Row Name 07/26/16 1502 08/23/16 1625 09/16/16 1234       Exercise Comments Pt completed first exercise session today and reported no signs of SOB, CP or dizziness. Pt was oriented to exercise equipment on 07/26/16.  Reviewed METs and goals. Pt is tolerating exercise very well; will continue to monitor exercise progression and activity levels. Reviewed METs and goals. Pt is tolerating exercise very well; will continue to monitor exercise progression and activity levels.        Exercise Goals and Review:     Exercise Goals    Row Name 07/22/16 [phone]  Exercise Goals   Increase Physical Activity Yes       Intervention Provide advice, education, support and counseling about physical activity/exercise needs.;Develop an individualized exercise prescription for aerobic and resistive training based on initial evaluation findings, risk stratification, comorbidities and participant's personal goals.       Expected Outcomes Achievement of increased cardiorespiratory fitness and enhanced flexibility, muscular endurance and strength shown through measurements of functional capacity and personal statement of participant.       Increase Strength and Stamina Yes       Intervention Provide advice, education, support and counseling about physical activity/exercise needs.;Develop an individualized exercise prescription for aerobic  and resistive training based on initial evaluation findings, risk stratification, comorbidities and participant's personal goals.       Expected Outcomes Achievement of increased cardiorespiratory fitness and enhanced flexibility, muscular endurance and strength shown through measurements of functional capacity and personal statement of participant.          Exercise Goals Re-Evaluation :     Exercise Goals Re-Evaluation    Row Name 08/06/16 1507 08/23/16 1626 09/16/16 1233 09/16/16 1240       Exercise Goal Re-Evaluation   Exercise Goals Review Increase Physical Activity;Increase Strenth and Stamina Increase Physical Activity;Increase Strenth and Stamina Increase Physical Activity;Able to understand and use rate of perceived exertion (RPE) scale;Knowledge and understanding of Target Heart Rate Range (THRR);Understanding of Exercise Prescription;Able to understand and use Dyspnea scale;Increase Strength and Stamina Increase Strength and Stamina;Understanding of Exercise Prescription;Knowledge and understanding of Target Heart Rate Range (THRR);Able to understand and use rate of perceived exertion (RPE) scale;Increase Physical Activity    Comments Reviewed home exercise with pt today.  Pt plans to walk for exercise, 3x/week in addition to coming to cardiac rehab.  Reviewed THR, pulse, RPE, sign and symptoms, NTG use, and when to call 911 or MD.  Also discussed weather considerations and indoor options.  Pt voiced understanding. Pt is walking for 30 minutes 2x/week in addition to coming to cardiac rehab. Pt stated feeling stronger since starting the program.  Pt is compliant with home exercise. Pt wallking tolerance and speed has increased. Pt is currently doing 2.57mph on the treadmill in cardiac rehab.  -    Expected Outcomes Pt will be compliant with HEP and improve in cardiorespiratory fitness Pt will be compliant with HEP and improve in cardiorespiratory fitness Pt will be compliant with HEP and  improve in cardiorespiratory fitness  -        Discharge Exercise Prescription (Final Exercise Prescription Changes):     Exercise Prescription Changes - 09/16/16 1200      Response to Exercise   Blood Pressure (Admit) 154/64   Blood Pressure (Exercise) 150/60   Blood Pressure (Exit) 134/68   Heart Rate (Admit) 88 bpm   Heart Rate (Exercise) 117 bpm   Heart Rate (Exit) 84 bpm   Rating of Perceived Exertion (Exercise) 12   Symptoms none   Comments pt was instructed to slow down on RPM's and speed due to elevated BP   Duration Continue with 30 min of aerobic exercise without signs/symptoms of physical distress.   Intensity THRR unchanged     Progression   Progression Continue to progress workloads to maintain intensity without signs/symptoms of physical distress.   Average METs 3     Resistance Training   Training Prescription Yes   Weight 3lbs   Reps 10-15   Time 10 Minutes     Treadmill   MPH 2.5  Grade 0   Minutes 10   METs 2.91     Recumbant Bike   Level 3   Minutes 10   METs 2.2     NuStep   Level 4   SPM 80   Minutes 10   METs 3.9     Home Exercise Plan   Plans to continue exercise at Home (comment)  walking   Frequency Add 3 additional days to program exercise sessions.   Initial Home Exercises Provided 08/06/16      Nutrition:  Target Goals: Understanding of nutrition guidelines, daily intake of sodium 1500mg , cholesterol 200mg , calories 30% from fat and 7% or less from saturated fats, daily to have 5 or more servings of fruits and vegetables.  Biometrics:     Pre Biometrics - 07/22/16 1017      Pre Biometrics   Waist Circumference 38 inches   Hip Circumference 38 inches   Waist to Hip Ratio 1 %   Triceps Skinfold 18 mm   % Body Fat 26.3 %   Grip Strength 41 kg   Flexibility 0 in   Single Leg Stand 0 seconds       Nutrition Therapy Plan and Nutrition Goals:     Nutrition Therapy & Goals - 07/22/16 1229      Nutrition Therapy    Diet Therapeutic Lifestyle Changes     Intervention Plan   Intervention Prescribe, educate and counsel regarding individualized specific dietary modifications aiming towards targeted core components such as weight, hypertension, lipid management, diabetes, heart failure and other comorbidities.   Expected Outcomes Short Term Goal: Understand basic principles of dietary content, such as calories, fat, sodium, cholesterol and nutrients.;Long Term Goal: Adherence to prescribed nutrition plan.      Nutrition Discharge: Nutrition Scores:     Nutrition Assessments - 07/22/16 1223      MEDFICTS Scores   Pre Score 67      Nutrition Goals Re-Evaluation:   Nutrition Goals Re-Evaluation:   Nutrition Goals Discharge (Final Nutrition Goals Re-Evaluation):   Psychosocial: Target Goals: Acknowledge presence or absence of significant depression and/or stress, maximize coping skills, provide positive support system. Participant is able to verbalize types and ability to use techniques and skills needed for reducing stress and depression.  Initial Review & Psychosocial Screening:     Initial Psych Review & Screening - 07/22/16 1328      Initial Review   Current issues with None Identified     Family Dynamics   Good Support System? Yes     Barriers   Psychosocial barriers to participate in program The patient should benefit from training in stress management and relaxation.     Screening Interventions   Interventions Encouraged to exercise      Quality of Life Scores:     Quality of Life - 07/22/16 1018      Quality of Life Scores   Health/Function Pre 27.54 %   Socioeconomic Pre 26.29 %   Psych/Spiritual Pre 27.36 %   Family Pre 30 %   GLOBAL Post 27.61 %      PHQ-9: Recent Review Flowsheet Data    Depression screen [facility] 2/9 07/26/2016   Decreased Interest 0   Down, Depressed, Hopeless 0   PHQ - 2 Score 0     Interpretation of Total Score  Total Score Depression  Severity:  1-4 = Minimal depression, 5-9 = Mild depression, 10-14 = Moderate depression, 15-19 = Moderately severe depression, 20-27 = Severe depression  Psychosocial Evaluation and Intervention:     Psychosocial Evaluation - 09/21/16 2209      Psychosocial Evaluation & Interventions   Interventions Encouraged to exercise with the program and follow exercise prescription;Stress management education;Relaxation education   Comments Pt is happy go lucky and recently celebrated 74 years of marriage.   Expected Outcomes Continue to display positive and healthy outlook and contribute meaningfully to the community   Continue Psychosocial Services  No Follow up required      Psychosocial Re-Evaluation:     Psychosocial Re-Evaluation    [place] Name 08/23/16 1242             Psychosocial Re-Evaluation   Current issues with None Identified       Interventions Stress management education;Encouraged to attend Cardiac Rehabilitation for the exercise;Relaxation education       Continue Psychosocial Services  Follow up required by staff          Psychosocial Discharge (Final Psychosocial Re-Evaluation):     Psychosocial Re-Evaluation - 08/23/16 1242      Psychosocial Re-Evaluation   Current issues with None Identified   Interventions Stress management education;Encouraged to attend Cardiac Rehabilitation for the exercise;Relaxation education   Continue Psychosocial Services  Follow up required by staff      Vocational Rehabilitation: Provide vocational rehab assistance to qualifying candidates.   Vocational Rehab Evaluation & Intervention:     Vocational Rehab - 07/22/16 1329      Initial Vocational Rehab Evaluation & Intervention   Assessment shows need for Vocational Rehabilitation No  Pt does not plan to return to competive employment      Education: Education Goals: Education classes will be provided on a weekly basis, covering required topics. Participant will state  understanding/return demonstration of topics presented.  Learning Barriers/Preferences:     Learning Barriers/Preferences - 07/22/16 0908      Learning Barriers/Preferences   Learning Barriers None   Learning Preferences Written Material;Skilled Demonstration;Audio      Education Topics: Count Your Pulse:  -Group instruction provided by verbal instruction, demonstration, patient participation and written materials to support subject.  Instructors address importance of being able to find your pulse and how to count your pulse when at home without a heart monitor.  Patients get hands on experience counting their pulse with staff help and individually.   CARDIAC REHAB PHASE II EXERCISE from 09/17/2016 in [place]  Date  09/17/16  Instruction Review Code  2- meets goals/outcomes      Heart Attack, Angina, and Risk Factor Modification:  -Group instruction provided by verbal instruction, video, and written materials to support subject.  Instructors address signs and symptoms of angina and heart attacks.    Also discuss risk factors for heart disease and how to make changes to improve heart health risk factors.   CARDIAC REHAB PHASE II EXERCISE from 09/17/2016 in [place]  Date  08/04/16  Instruction Review Code  2- meets goals/outcomes      Functional Fitness:  -Group instruction provided by verbal instruction, demonstration, patient participation, and written materials to support subject.  Instructors address safety measures for doing things around the house.  Discuss how to get up and down off the floor, how to pick things up properly, how to safely get out of a chair without assistance, and balance training.   CARDIAC REHAB PHASE II EXERCISE from 09/17/2016 in [place]  Date  08/27/16  Instruction  Review Code  2- meets goals/outcomes      Meditation and Mindfulness:  -Group instruction  provided by verbal instruction, patient participation, and written materials to support subject.  Instructor addresses importance of mindfulness and meditation practice to help reduce stress and improve awareness.  Instructor also leads participants through a meditation exercise.    CARDIAC REHAB PHASE II EXERCISE from 09/17/2016 in [place]  Date  08/25/16  Instruction Review Code  2- meets goals/outcomes      Stretching for Flexibility and Mobility:  -Group instruction provided by verbal instruction, patient participation, and written materials to support subject.  Instructors lead participants through series of stretches that are designed to increase flexibility thus improving mobility.  These stretches are additional exercise for major muscle groups that are typically performed during regular warm up and cool down.   CARDIAC REHAB PHASE II EXERCISE from 09/17/2016 in [place]  Date  09/03/16  Educator  EP  Instruction Review Code  R- Review/reinforce      Hands Only CPR:  -Group verbal, video, and participation provides a basic overview of AHA guidelines for community CPR. Role-play of emergencies allow participants the opportunity to practice calling for help and chest compression technique with discussion of AED use.   Hypertension: -Group verbal and written instruction that provides a basic overview of hypertension including the most recent diagnostic guidelines, risk factor reduction with self-care instructions and medication management.   CARDIAC REHAB PHASE II EXERCISE from 09/17/2016 in [place]  Date  08/20/16  Instruction Review Code  2- meets goals/outcomes       Nutrition I class: Heart Healthy Eating:  -Group instruction provided by PowerPoint slides, verbal discussion, and written materials to support subject matter. The instructor gives an explanation and review of the  Therapeutic Lifestyle Changes diet recommendations, which includes a discussion on lipid goals, dietary fat, sodium, fiber, plant stanol/sterol esters, sugar, and the components of a well-balanced, healthy diet.   Nutrition II class: Lifestyle Skills:  -Group instruction provided by PowerPoint slides, verbal discussion, and written materials to support subject matter. The instructor gives an explanation and review of label reading, grocery shopping for heart health, heart healthy recipe modifications, and ways to make healthier choices when eating out.   Diabetes Question & Answer:  -Group instruction provided by PowerPoint slides, verbal discussion, and written materials to support subject matter. The instructor gives an explanation and review of diabetes co-morbidities, pre- and post-prandial blood glucose goals, pre-exercise blood glucose goals, signs, symptoms, and treatment of hypoglycemia and hyperglycemia, and foot care basics.   Diabetes Blitz:  -Group instruction provided by PowerPoint slides, verbal discussion, and written materials to support subject matter. The instructor gives an explanation and review of the physiology behind type 1 and type 2 diabetes, diabetes medications and rational behind using different medications, pre- and post-prandial blood glucose recommendations and Hemoglobin A1c goals, diabetes diet, and exercise including blood glucose guidelines for exercising safely.    Portion Distortion:  -Group instruction provided by PowerPoint slides, verbal discussion, written materials, and food models to support subject matter. The instructor gives an explanation of serving size versus portion size, changes in portions sizes over the last 20 years, and what consists of a serving from each food group.   CARDIAC REHAB PHASE II EXERCISE from 09/17/2016 in [place]  Date  09/01/16  Educator  RD  Instruction Review Code  2- meets goals/outcomes       Stress Management:  -Group instruction provided by verbal instruction, video, and written materials to support subject matter.  Instructors review role of stress in heart disease and how to cope with stress positively.     Exercising on Your Own:  -Group instruction provided by verbal instruction, power point, and written materials to support subject.  Instructors discuss benefits of exercise, components of exercise, frequency and intensity of exercise, and end points for exercise.  Also discuss use of nitroglycerin and activating EMS.  Review options of places to exercise outside of rehab.  Review guidelines for sex with heart disease.   CARDIAC REHAB PHASE II EXERCISE from 09/17/2016 in [place]  Date  09/15/16  Instruction Review Code  2- meets goals/outcomes      Cardiac Drugs I:  -Group instruction provided by verbal instruction and written materials to support subject.  Instructor reviews cardiac drug classes: antiplatelets, anticoagulants, beta blockers, and statins.  Instructor discusses reasons, side effects, and lifestyle considerations for each drug class.   CARDIAC REHAB PHASE II EXERCISE from 09/17/2016 in [place]  Date  08/18/16  Instruction Review Code  2- meets goals/outcomes      Cardiac Drugs II:  -Group instruction provided by verbal instruction and written materials to support subject.  Instructor reviews cardiac drug classes: angiotensin converting enzyme inhibitors (ACE-I), angiotensin II receptor blockers (ARBs), nitrates, and calcium channel blockers.  Instructor discusses reasons, side effects, and lifestyle considerations for each drug class.   Anatomy and Physiology of the Circulatory System:  Group verbal and written instruction and models provide basic cardiac anatomy and physiology, with the coronary electrical and arterial systems. Review of: AMI, Angina, Valve disease, Heart Failure,  Peripheral Artery Disease, Cardiac Arrhythmia, Pacemakers, and the ICD.   Other Education:  -Group or individual verbal, written, or video instructions that support the educational goals of the cardiac rehab program.   Knowledge Questionnaire Score:     Knowledge Questionnaire Score - 07/22/16 1016      Knowledge Questionnaire Score   Pre Score 18/24      Core Components/Risk Factors/Patient Goals at Admission:     Personal Goals and Risk Factors at Admission - 07/22/16 1018      Core Components/Risk Factors/Patient Goals on Admission   Hypertension Yes   Intervention Provide education on lifestyle modifcations including regular physical activity/exercise, weight management, moderate sodium restriction and increased consumption of fresh fruit, vegetables, and low fat dairy, alcohol moderation, and smoking cessation.;Monitor prescription use compliance.   Expected Outcomes Short Term: Continued assessment and intervention until BP is < 140/20mm HG in hypertensive participants. < 130/57mm HG in hypertensive participants with diabetes, heart failure or chronic kidney disease.;Long Term: Maintenance of blood pressure at goal levels.   Lipids Yes   Intervention Provide education and support for participant on nutrition & aerobic/resistive exercise along with prescribed medications to achieve LDL 70mg , HDL >40mg .   Expected Outcomes Short Term: Participant states understanding of desired cholesterol values and is compliant with medications prescribed. Participant is following exercise prescription and nutrition guidelines.;Long Term: Cholesterol controlled with medications as prescribed, with individualized exercise RX and with personalized nutrition plan. Value goals: LDL < 70mg , HDL > 40 mg.      Core Components/Risk Factors/Patient Goals Review:      Goals and Risk Factor Review    Row Name 08/23/16 1233 09/21/16 2207  Core Components/Risk Factors/Patient Goals Review    Personal Goals Review Lipids;Hypertension Lipids;Hypertension      Review Pt is making progress toward meeting his goals.  Pt bp readings have been within normal limits.  Pt is expecting blood work to [facility] the efficacy of his medicaitons, diet and exercise for lowering his total cholesterol. Pt is making progress toward meeting his goals.  Pt bp readings have been within normal limits.  Pt is expecting blood work to [facility] the efficacy of his medicaitons, diet and exercise for lowering his total cholesterol. No new lab results noted in Epic,.      Expected Outcomes Pt bp and lipid panel will contineu to show improvement or be at goal. Pt  exertional bp and lipid panel will contineu to show improvement or be at goal.         Core Components/Risk Factors/Patient Goals at Discharge (Final Review):      Goals and Risk Factor Review - 09/21/16 2207      Core Components/Risk Factors/Patient Goals Review   Personal Goals Review Lipids;Hypertension   Review Pt is making progress toward meeting his goals.  Pt bp readings have been within normal limits.  Pt is expecting blood work to [facility] the efficacy of his medicaitons, diet and exercise for lowering his total cholesterol. No new lab results noted in Epic,.   Expected Outcomes Pt  exertional bp and lipid panel will contineu to show improvement or be at goal.      ITP Comments:     ITP Comments    Row Name 07/22/16 0843           ITP Comments Medical Director, Dr. Fransico Him          Comments:  Jesus Harmon is making expected progress toward personal goals after completing 23 sessions.Brief Psychosocial Assessment - No psychosocial needs identified at this time, no psychosocial interventions necessary. Pt enjoys wood working and just celebrated 58 years of marriage with his "bride". Pt feels supportive in his rehab participation.   Recommend continued exercise and life style modification education including  stress management and  relaxation techniques to decrease cardiac risk profile. Cherre Huger, BSN Cardiac and Training and development officer

## 2016-09-22 ENCOUNTER — Encounter (HOSPITAL_COMMUNITY): Payer: Medicare Other

## 2016-09-22 ENCOUNTER — Encounter (HOSPITAL_COMMUNITY)
Admission: RE | Admit: 2016-09-22 | Discharge: 2016-09-22 | Disposition: A | Discharge status: Home / Self Care | Payer: Medicare Other | Source: Ambulatory Visit | Attending: Cardiovascular Disease | Admitting: Cardiovascular Disease

## 2016-09-22 DIAGNOSIS — Z955 Presence of coronary angioplasty implant and graft: Secondary | ICD-10-CM | POA: Diagnosis not present

## 2016-09-24 ENCOUNTER — Encounter (HOSPITAL_COMMUNITY): Payer: Medicare Other

## 2016-09-24 ENCOUNTER — Encounter (HOSPITAL_COMMUNITY)
Admission: RE | Admit: 2016-09-24 | Discharge: 2016-09-24 | Disposition: A | Discharge status: Home / Self Care | Payer: Medicare Other | Source: Ambulatory Visit | Attending: Cardiovascular Disease | Admitting: Cardiovascular Disease

## 2016-09-24 DIAGNOSIS — Z955 Presence of coronary angioplasty implant and graft: Secondary | ICD-10-CM

## 2016-09-27 ENCOUNTER — Encounter (HOSPITAL_COMMUNITY): Payer: Medicare Other

## 2016-09-27 ENCOUNTER — Encounter (HOSPITAL_COMMUNITY)
Admission: RE | Admit: 2016-09-27 | Discharge: 2016-09-27 | Disposition: A | Discharge status: Home / Self Care | Payer: Medicare Other | Source: Ambulatory Visit | Attending: Cardiovascular Disease | Admitting: Cardiovascular Disease

## 2016-09-27 DIAGNOSIS — Z955 Presence of coronary angioplasty implant and graft: Secondary | ICD-10-CM | POA: Diagnosis not present

## 2016-09-29 ENCOUNTER — Encounter (HOSPITAL_COMMUNITY)
Admission: RE | Admit: 2016-09-29 | Discharge: 2016-09-29 | Disposition: A | Discharge status: Home / Self Care | Payer: Medicare Other | Source: Ambulatory Visit | Attending: Cardiovascular Disease | Admitting: Cardiovascular Disease

## 2016-09-29 ENCOUNTER — Encounter (HOSPITAL_COMMUNITY): Payer: Medicare Other

## 2016-09-29 DIAGNOSIS — Z955 Presence of coronary angioplasty implant and graft: Secondary | ICD-10-CM | POA: Diagnosis not present

## 2016-10-01 ENCOUNTER — Encounter (HOSPITAL_COMMUNITY): Payer: Medicare Other

## 2016-10-01 ENCOUNTER — Encounter (HOSPITAL_COMMUNITY): Admission: RE | Admit: 2016-10-01 | Payer: Medicare Other | Source: Ambulatory Visit

## 2016-10-04 ENCOUNTER — Encounter (HOSPITAL_COMMUNITY): Payer: Medicare Other

## 2016-10-04 ENCOUNTER — Encounter (HOSPITAL_COMMUNITY)
Admission: RE | Admit: 2016-10-04 | Discharge: 2016-10-04 | Disposition: A | Discharge status: Home / Self Care | Payer: Medicare Other | Source: Ambulatory Visit | Attending: Cardiovascular Disease | Admitting: Cardiovascular Disease

## 2016-10-04 DIAGNOSIS — Z955 Presence of coronary angioplasty implant and graft: Secondary | ICD-10-CM | POA: Diagnosis not present

## 2016-10-06 ENCOUNTER — Encounter (HOSPITAL_COMMUNITY): Payer: Medicare Other

## 2016-10-06 ENCOUNTER — Encounter (HOSPITAL_COMMUNITY)
Admission: RE | Admit: 2016-10-06 | Discharge: 2016-10-06 | Disposition: A | Discharge status: Home / Self Care | Payer: Medicare Other | Source: Ambulatory Visit | Attending: Cardiovascular Disease | Admitting: Cardiovascular Disease

## 2016-10-06 DIAGNOSIS — Z955 Presence of coronary angioplasty implant and graft: Secondary | ICD-10-CM

## 2016-10-06 DIAGNOSIS — K5900 Constipation, unspecified: Secondary | ICD-10-CM | POA: Diagnosis not present

## 2016-10-08 ENCOUNTER — Encounter (HOSPITAL_COMMUNITY): Payer: Medicare Other

## 2016-10-08 ENCOUNTER — Encounter (HOSPITAL_COMMUNITY)
Admission: RE | Admit: 2016-10-08 | Discharge: 2016-10-08 | Disposition: A | Discharge status: Home / Self Care | Payer: Medicare Other | Source: Ambulatory Visit | Attending: Cardiovascular Disease | Admitting: Cardiovascular Disease

## 2016-10-08 DIAGNOSIS — Z955 Presence of coronary angioplasty implant and graft: Secondary | ICD-10-CM | POA: Diagnosis not present

## 2016-10-11 ENCOUNTER — Encounter (HOSPITAL_COMMUNITY)
Admission: RE | Admit: 2016-10-11 | Discharge: 2016-10-11 | Disposition: A | Discharge status: Home / Self Care | Payer: Medicare Other | Source: Ambulatory Visit | Attending: Cardiovascular Disease | Admitting: Cardiovascular Disease

## 2016-10-11 ENCOUNTER — Encounter (HOSPITAL_COMMUNITY): Payer: Medicare Other

## 2016-10-11 DIAGNOSIS — Z79899 Other long term (current) drug therapy: Secondary | ICD-10-CM | POA: Insufficient documentation

## 2016-10-11 DIAGNOSIS — Z955 Presence of coronary angioplasty implant and graft: Secondary | ICD-10-CM | POA: Insufficient documentation

## 2016-10-11 DIAGNOSIS — I2510 Atherosclerotic heart disease of native coronary artery without angina pectoris: Secondary | ICD-10-CM | POA: Insufficient documentation

## 2016-10-11 DIAGNOSIS — Z87891 Personal history of nicotine dependence: Secondary | ICD-10-CM | POA: Diagnosis not present

## 2016-10-11 DIAGNOSIS — E785 Hyperlipidemia, unspecified: Secondary | ICD-10-CM | POA: Diagnosis not present

## 2016-10-11 DIAGNOSIS — M1990 Unspecified osteoarthritis, unspecified site: Secondary | ICD-10-CM | POA: Diagnosis not present

## 2016-10-11 DIAGNOSIS — Z87442 Personal history of urinary calculi: Secondary | ICD-10-CM | POA: Insufficient documentation

## 2016-10-11 DIAGNOSIS — Z7982 Long term (current) use of aspirin: Secondary | ICD-10-CM | POA: Insufficient documentation

## 2016-10-11 DIAGNOSIS — Z7902 Long term (current) use of antithrombotics/antiplatelets: Secondary | ICD-10-CM | POA: Diagnosis not present

## 2016-10-13 ENCOUNTER — Encounter (HOSPITAL_COMMUNITY): Payer: Medicare Other

## 2016-10-13 ENCOUNTER — Encounter (HOSPITAL_COMMUNITY)
Admission: RE | Admit: 2016-10-13 | Discharge: 2016-10-13 | Disposition: A | Discharge status: Home / Self Care | Payer: Medicare Other | Source: Ambulatory Visit | Attending: Cardiovascular Disease | Admitting: Cardiovascular Disease

## 2016-10-13 DIAGNOSIS — Z955 Presence of coronary angioplasty implant and graft: Secondary | ICD-10-CM | POA: Diagnosis not present

## 2016-10-15 ENCOUNTER — Encounter (HOSPITAL_COMMUNITY): Payer: Medicare Other

## 2016-10-15 ENCOUNTER — Encounter (HOSPITAL_COMMUNITY)
Admission: RE | Admit: 2016-10-15 | Discharge: 2016-10-15 | Disposition: A | Discharge status: Home / Self Care | Payer: Medicare Other | Source: Ambulatory Visit | Attending: Cardiovascular Disease | Admitting: Cardiovascular Disease

## 2016-10-15 DIAGNOSIS — Z955 Presence of coronary angioplasty implant and graft: Secondary | ICD-10-CM | POA: Diagnosis not present

## 2016-10-15 MED FILL — LATANOPROST 0.005% EYE DRP: 0.005 | 25 days supply | Qty: 3 | Fill #2

## 2016-10-15 MED FILL — CLOPIDOGREL 75 MG TABLET: 75 | 30 days supply | Qty: 30 | Fill #4

## 2016-10-17 ENCOUNTER — Encounter (HOSPITAL_COMMUNITY): Payer: Self-pay

## 2016-10-17 NOTE — Progress Notes (Signed)
Cardiac Individual Treatment Plan  Patient Details  Name: Jesus Harmon MRN: [id number] Date of Birth: 1929-07-25 Referring Provider:     CARDIAC REHAB PHASE II ORIENTATION from 07/22/2016 in [place]  Referring Provider  Sherren Mocha       Initial Encounter Date:    CARDIAC REHAB PHASE II ORIENTATION from 07/22/2016 in [place]  Date  07/22/16  Referring Provider  Sherren Mocha       Visit Diagnosis: 06/16/16 Status post coronary artery stent placement  Patient's Home Medications on Admission:  Current Outpatient Prescriptions:  .  acetaminophen (TYLENOL) 500 MG chewable tablet, Chew 500 mg by mouth every 6 (six) hours as needed for pain., Disp: , Rfl:  .  aspirin EC 81 MG tablet, Take 81 mg by mouth daily., Disp: , Rfl:  .  clopidogrel (PLAVIX) 75 MG tablet, Take 1 tablet (75 mg total) by mouth daily with breakfast., Disp: 30 tablet, Rfl: 11 .  fexofenadine (ALLEGRA) 180 MG tablet, Take 180 mg by mouth daily., Disp: , Rfl:  .  latanoprost (XALATAN) 0.005 % ophthalmic solution, Place 1 drop into both eyes at bedtime. , Disp: , Rfl:  .  lisinopril (PRINIVIL,ZESTRIL) 10 MG tablet, Take 1 tablet (10 mg total) by mouth daily., Disp: 90 tablet, Rfl: 3 .  nitroGLYCERIN (NITROSTAT) 0.4 MG SL tablet, Place 1 tablet (0.4 mg total) under the tongue every 5 (five) minutes as needed for chest pain., Disp: 25 tablet, Rfl: 5 .  simvastatin (ZOCOR) 20 MG tablet, Take 1 tablet (20 mg total) by mouth daily., Disp: 90 tablet, Rfl: 3  Past Medical History: Past Medical History:  Diagnosis Date  . Arthritis    back and neck (06/16/2016)  . CAD (coronary artery disease)    a. LHC 10/2009:  pLAD 50%, small D1 70-80% (too small for PCI), EF 65% => med Rx  b. Myoview 10/2009: EF 65%, no ischemia;  c.  ETT-MV 3/14:  No ischemia, EF 63%  . History of kidney stones    "passed them"  . HLD (hyperlipidemia)    "on RX; never had high  cholestrol" (06/16/2016)  . HTN (hypertension)    "on RX; never HTN" (06/16/2016)  . Migraine    "in high school" (06/16/2016)    Tobacco Use: History  Smoking Status  . Former Smoker  . Packs/day: 1.00  . Years: 18.00  . Quit date: 01/12/1971  Smokeless Tobacco  . Never Used    Labs: Recent Review Flowsheet Data    Labs for ITP Cardiac and Pulmonary Rehab Latest Ref Rng & Units 10/26/2009 12/08/2009 11/06/2010   Cholestrol 0 - 200 mg/dL 175 ATP III CLASSIFICATION: <200     mg/dL   Desirable 200-239  mg/dL   Borderline High >=240    mg/dL   High 162 153   LDLCALC 0 - 99 mg/dL 94 Total Cholesterol/HDL:CHD Risk Coronary Heart Disease Risk Table Men   Women 1/2 Average Risk   3.4   3.3 Average Risk       5.0   4.4 2 X Average Risk   9.6   7.1 3 X Average Risk  23.4   11.0 Use the calculated Patient Ratio above and the CHD Risk Table to determine the patient's CHD Risk. ATP III CLASSIFICATION (LDL): <100     mg/dL   Optimal 100-129  mg/dL   Near or Above Optimal 130-159  mg/dL   Borderline 160-189  mg/dL  High >190     mg/dL   Very High 82 64   HDL >39.00 mg/dL 70 71.20 82.40   Trlycerides 0.0 - 149.0 mg/dL 55 44.0 34.0      Capillary Blood Glucose: No results found for: GLUCAP   Exercise Target Goals:    Exercise Program Goal: Individual exercise prescription set with THRR, safety & activity barriers. Participant demonstrates ability to understand and report RPE using BORG scale, to self-measure pulse accurately, and to acknowledge the importance of the exercise prescription.  Exercise Prescription Goal: Starting with aerobic activity 30 plus minutes a day, 3 days per week for initial exercise prescription. Provide home exercise prescription and guidelines that participant acknowledges understanding prior to discharge.  Activity Barriers & Risk Stratification:     Activity Barriers & Cardiac Risk Stratification - 07/22/16 0959      Activity Barriers & Cardiac  Risk Stratification   Activity Barriers Back Problems   Cardiac Risk Stratification Moderate      6 Minute Walk:     6 Minute Walk    Row Name 07/22/16 0901 07/22/16 1017 10/15/16 1024     6 Minute Walk   Phase Initial  - Discharge   Distance  - 888 feet 1502 feet   Distance Feet Change  -  - 614 ft   Walk Time 6 minutes  - 6 minutes   # of Rest Breaks  - 0 0   MPH  - 1.68 2.84   METS  - 1.62 2.74   RPE  - 11 12   VO2 Peak  - 5.68 9.6   Symptoms  - No No   Resting HR  - 69 bpm 66 bpm   Resting BP  - 144/70 122/64   Max Ex. HR  - 90 bpm 93 bpm   Max Ex. BP  - 142/76 142/72   2 Minute Post BP  - 122/58 136/64   Row Name 10/15/16 1025         6 Minute Walk   Distance % Change 69.14 %        Oxygen Initial Assessment:   Oxygen Re-Evaluation:   Oxygen Discharge (Final Oxygen Re-Evaluation):   Initial Exercise Prescription:     Initial Exercise Prescription - 07/22/16 1000      Date of Initial Exercise RX and Referring Provider   Date 07/22/16   Referring Provider Sherren Mocha      Treadmill   MPH 1.5   Grade 0   Minutes 10   METs 2.15     Recumbant Bike   Level 1.5   Minutes 10   METs 2     NuStep   Level 2   SPM 60   Minutes 10   METs 1.7     Prescription Details   Frequency (times per week) 3   Duration Progress to 30 minutes of continuous aerobic without signs/symptoms of physical distress     Intensity   THRR 40-80% of Max Heartrate 54-108   Ratings of Perceived Exertion 11-15   Perceived Dyspnea 0-4     Progression   Progression Continue to progress workloads to maintain intensity without signs/symptoms of physical distress.     Resistance Training   Training Prescription Yes   Weight 2lbs   Reps 10-15      Perform Capillary Blood Glucose checks as needed.  Exercise Prescription Changes:      Exercise Prescription Changes    Row Name 07/26/16 1500 08/09/16 1538 08/24/16  1500 09/10/16 1229 09/16/16 1200     Response  to Exercise   Blood Pressure (Admit) 144/70 134/70 114/80 114/78 154/64   Blood Pressure (Exercise) 144/70 140/72 144/70 158/60 150/60   Blood Pressure (Exit) 130/80 142/62 118/78 130/80 134/68   Heart Rate (Admit) 64 bpm 68 bpm 71 bpm 100 bpm 88 bpm   Heart Rate (Exercise) 93 bpm 118 bpm 108 bpm 109 bpm 117 bpm   Heart Rate (Exit) 66 bpm 67 bpm 70 bpm 89 bpm 84 bpm   Rating of Perceived Exertion (Exercise) 11 12 11 11 12    Symptoms none none none none none   Comments pt was oriented to exercise equipment on 07/26/16  - -  - pt was instructed to slow down on RPM's and speed due to elevated BP   Duration Continue with 30 min of aerobic exercise without signs/symptoms of physical distress. Continue with 30 min of aerobic exercise without signs/symptoms of physical distress. Continue with 30 min of aerobic exercise without signs/symptoms of physical distress. Continue with 30 min of aerobic exercise without signs/symptoms of physical distress. Continue with 30 min of aerobic exercise without signs/symptoms of physical distress.   Intensity THRR unchanged THRR unchanged THRR unchanged THRR unchanged THRR unchanged     Progression   Progression Continue to progress workloads to maintain intensity without signs/symptoms of physical distress. Continue to progress workloads to maintain intensity without signs/symptoms of physical distress. Continue to progress workloads to maintain intensity without signs/symptoms of physical distress. Continue to progress workloads to maintain intensity without signs/symptoms of physical distress. Continue to progress workloads to maintain intensity without signs/symptoms of physical distress.   Average METs 2 2.7 2.9 2.9 3     Resistance Training   Training Prescription Yes Yes Yes Yes Yes   Weight 2lbs 2lbs 3lbs 3lbs 3lbs   Reps 10-15 10-15 10-15 10-15 10-15   Time 10 Minutes 10 Minutes 10 Minutes 10 Minutes 10 Minutes     Treadmill   MPH 1.5 2.3 2.3 2.5 2.5    Grade 0 0 0 0 0   Minutes 10 10 10 10 10    METs 2.15 2.76 2.76 2.91 2.91     Recumbant Bike   Level 1.5 2.5 3 3 3    Minutes 10 10 10 10 10    METs 1.8 2 2.3 2.2 2.2     NuStep   Level 2 3 3 4 4    SPM 60 80 80 80 80   Minutes 10 10 10 10 10    METs 1.8 2.8 3.5 3.5 3.9     Home Exercise Plan   Plans to continue exercise at  - Home (comment)  walking Home (comment)  walking Home (comment)  walking Home (comment)  walking   Frequency  - Add 3 additional days to program exercise sessions. Add 3 additional days to program exercise sessions. Add 3 additional days to program exercise sessions. Add 3 additional days to program exercise sessions.   Initial Home Exercises Provided  - 08/06/16 08/06/16 08/06/16 08/06/16   Row Name 09/29/16 0828 10/13/16 0800           Response to Exercise   Blood Pressure (Admit) 128/70 122/64      Blood Pressure (Exercise) 128/80 170/74      Blood Pressure (Exit) 128/70 136/64      Heart Rate (Admit) 63 bpm 70 bpm      Heart Rate (Exercise) 107 bpm 112 bpm      Heart Rate (  Exit) 64 bpm 80 bpm      Rating of Perceived Exertion (Exercise) 11 12      Symptoms none none      Duration Continue with 30 min of aerobic exercise without signs/symptoms of physical distress. Continue with 30 min of aerobic exercise without signs/symptoms of physical distress.      Intensity THRR unchanged THRR unchanged        Progression   Progression Continue to progress workloads to maintain intensity without signs/symptoms of physical distress. Continue to progress workloads to maintain intensity without signs/symptoms of physical distress.      Average METs 3.5 3.4        Resistance Training   Training Prescription Yes Yes      Weight 3lbs 3lbs      Reps 10-15 10-15      Time 10 Minutes 10 Minutes        Treadmill   MPH 2.5 2.5      Grade 0 0      Minutes 10 10      METs 2.91 2.91        Recumbant Bike   Level 3 3      Minutes 10 10      METs 3.7 3.6         NuStep   Level 4 4      SPM 90 90      Minutes 10 10      METs 3.7 3.6        Home Exercise Plan   Plans to continue exercise at Home (comment)  walking Home (comment)  walking      Frequency Add 3 additional days to program exercise sessions. Add 3 additional days to program exercise sessions.      Initial Home Exercises Provided 08/06/16 08/06/16         Exercise Comments:      Exercise Comments    Row Name 07/26/16 1502 08/23/16 1625 09/16/16 1234 10/18/16 1637     Exercise Comments Pt completed first exercise session today and reported no signs of SOB, CP or dizziness. Pt was oriented to exercise equipment on 07/26/16.  Reviewed METs and goals. Pt is tolerating exercise very well; will continue to monitor exercise progression and activity levels. Reviewed METs and goals. Pt is tolerating exercise very well; will continue to monitor exercise progression and activity levels. Reviewed METs and goals. Pt is tolerating exercise very well; will continue to monitor exercise progression and activity levels.       Exercise Goals and Review:      Exercise Goals    Row Name 07/22/16 0959             Exercise Goals   Increase Physical Activity Yes       Intervention Provide advice, education, support and counseling about physical activity/exercise needs.;Develop an individualized exercise prescription for aerobic and resistive training based on initial evaluation findings, risk stratification, comorbidities and participant's personal goals.       Expected Outcomes Achievement of increased cardiorespiratory fitness and enhanced flexibility, muscular endurance and strength shown through measurements of functional capacity and personal statement of participant.       Increase Strength and Stamina Yes       Intervention Provide advice, education, support and counseling about physical activity/exercise needs.;Develop an individualized exercise prescription for aerobic and resistive  training based on initial evaluation findings, risk stratification, comorbidities and participant's personal goals.       Expected  Outcomes Achievement of increased cardiorespiratory fitness and enhanced flexibility, muscular endurance and strength shown through measurements of functional capacity and personal statement of participant.          Exercise Goals Re-Evaluation :     Exercise Goals Re-Evaluation    Row Name 08/06/16 1507 08/23/16 1626 09/16/16 1233 09/16/16 1240 10/18/16 1637     Exercise Goal Re-Evaluation   Exercise Goals Review Increase Physical Activity;Increase Strenth and Stamina Increase Physical Activity;Increase Strenth and Stamina Increase Physical Activity;Able to understand and use rate of perceived exertion (RPE) scale;Knowledge and understanding of Target Heart Rate Range (THRR);Understanding of Exercise Prescription;Able to understand and use Dyspnea scale;Increase Strength and Stamina Increase Strength and Stamina;Understanding of Exercise Prescription;Knowledge and understanding of Target Heart Rate Range (THRR);Able to understand and use rate of perceived exertion (RPE) scale;Increase Physical Activity Increase Physical Activity;Able to understand and use rate of perceived exertion (RPE) scale;Knowledge and understanding of Target Heart Rate Range (THRR);Understanding of Exercise Prescription;Increase Strength and Stamina;Able to check pulse independently   Comments Reviewed home exercise with pt today.  Pt plans to walk for exercise, 3x/week in addition to coming to cardiac rehab.  Reviewed THR, pulse, RPE, sign and symptoms, NTG use, and when to call 911 or MD.  Also discussed weather considerations and indoor options.  Pt voiced understanding. Pt is walking for 30 minutes 2x/week in addition to coming to cardiac rehab. Pt stated feeling stronger since starting the program.  Pt is compliant with home exercise. Pt wallking tolerance and speed has increased. Pt is currently  doing 2.27mph on the treadmill in cardiac rehab.  - Pt continues to improve in walking tolerance and has increased pre/post walking distance/ 6 minute walk test by    Expected Outcomes Pt will be compliant with HEP and improve in cardiorespiratory fitness Pt will be compliant with HEP and improve in cardiorespiratory fitness Pt will be compliant with HEP and improve in cardiorespiratory fitness  -  -   Row Name 10/18/16 1638             Exercise Goal Re-Evaluation   Comments Pt continues to improve in walking tolerance and has increased pre/post walking distance/ 6 minute walk test by 633ft       Expected Outcomes Pt will continue to be compliant with HEP and improve in cardiorespiratory fitness           Discharge Exercise Prescription (Final Exercise Prescription Changes):     Exercise Prescription Changes - 10/13/16 0800      Response to Exercise   Blood Pressure (Admit) 122/64   Blood Pressure (Exercise) 170/74   Blood Pressure (Exit) 136/64   Heart Rate (Admit) 70 bpm   Heart Rate (Exercise) 112 bpm   Heart Rate (Exit) 80 bpm   Rating of Perceived Exertion (Exercise) 12   Symptoms none   Duration Continue with 30 min of aerobic exercise without signs/symptoms of physical distress.   Intensity THRR unchanged     Progression   Progression Continue to progress workloads to maintain intensity without signs/symptoms of physical distress.   Average METs 3.4     Resistance Training   Training Prescription Yes   Weight 3lbs   Reps 10-15   Time 10 Minutes     Treadmill   MPH 2.5   Grade 0   Minutes 10   METs 2.91     Recumbant Bike   Level 3   Minutes 10   METs 3.6     NuStep  Level 4   SPM 90   Minutes 10   METs 3.6     Home Exercise Plan   Plans to continue exercise at Home (comment)  walking   Frequency Add 3 additional days to program exercise sessions.   Initial Home Exercises Provided 08/06/16      Nutrition:  Target Goals: Understanding of  nutrition guidelines, daily intake of sodium 1500mg , cholesterol 200mg , calories 30% from fat and 7% or less from saturated fats, daily to have 5 or more servings of fruits and vegetables.  Biometrics:     Pre Biometrics - 07/22/16 1017      Pre Biometrics   Waist Circumference 38 inches   Hip Circumference 38 inches   Waist to Hip Ratio 1 %   Triceps Skinfold 18 mm   % Body Fat 26.3 %   Grip Strength 41 kg   Flexibility 0 in   Single Leg Stand 0 seconds         Post Biometrics - 10/15/16 1025       Post  Biometrics   Height 5' 8.5" (1.74 m)   Weight 153 lb 3.5 oz (69.5 kg)   Waist Circumference 35.5 inches   Hip Circumference 38 inches   Waist to Hip Ratio 0.93 %   BMI (Calculated) 22.96   Triceps Skinfold 20 mm   % Body Fat 25.8 %   Grip Strength 38 kg   Flexibility 0 in   Single Leg Stand 1.09 seconds      Nutrition Therapy Plan and Nutrition Goals:     Nutrition Therapy & Goals - 07/22/16 1229      Nutrition Therapy   Diet Therapeutic Lifestyle Changes     Intervention Plan   Intervention Prescribe, educate and counsel regarding individualized specific dietary modifications aiming towards targeted core components such as weight, hypertension, lipid management, diabetes, heart failure and other comorbidities.   Expected Outcomes Short Term Goal: Understand basic principles of dietary content, such as calories, fat, sodium, cholesterol and nutrients.;Long Term Goal: Adherence to prescribed nutrition plan.      Nutrition Discharge: Nutrition Scores:     Nutrition Assessments - 07/22/16 1223      MEDFICTS Scores   Pre Score 67      Nutrition Goals Re-Evaluation:   Nutrition Goals Re-Evaluation:   Nutrition Goals Discharge (Final Nutrition Goals Re-Evaluation):   Psychosocial: Target Goals: Acknowledge presence or absence of significant depression and/or stress, maximize coping skills, provide positive support system. Participant is able to  verbalize types and ability to use techniques and skills needed for reducing stress and depression.  Initial Review & Psychosocial Screening:     Initial Psych Review & Screening - 07/22/16 1328      Initial Review   Current issues with None Identified     Family Dynamics   Good Support System? Yes     Barriers   Psychosocial barriers to participate in program The patient should benefit from training in stress management and relaxation.     Screening Interventions   Interventions Encouraged to exercise      Quality of Life Scores:     Quality of Life - 07/22/16 1018      Quality of Life Scores   Health/Function Pre 27.54 %   Socioeconomic Pre 26.29 %   Psych/Spiritual Pre 27.36 %   Family Pre 30 %   GLOBAL Post 27.61 %      PHQ-9: Recent Review Flowsheet Data    Depression screen  PHQ 2/9 07/26/2016   Decreased Interest 0   Down, Depressed, Hopeless 0   PHQ - 2 Score 0     Interpretation of Total Score  Total Score Depression Severity:  1-4 = Minimal depression, 5-9 = Mild depression, 10-14 = Moderate depression, 15-19 = Moderately severe depression, 20-27 = Severe depression   Psychosocial Evaluation and Intervention:     Psychosocial Evaluation - 10/17/16 1056      Psychosocial Evaluation & Interventions   Interventions Encouraged to exercise with the program and follow exercise prescription   Comments Pt is happy go lucky who does not look his age and marvels his fellow class participatnts and rehab staff   Expected Outcomes Continue to display positive and healthy outlook and contribute meaningfully to the community.   Continue Psychosocial Services  No Follow up required      Psychosocial Re-Evaluation:     Psychosocial Re-Evaluation    [place] Name 08/23/16 1242 09/21/16 2233 10/17/16 1056         Psychosocial Re-Evaluation   Current issues with None Identified None Identified None Identified     Interventions Stress management  education;Encouraged to attend Cardiac Rehabilitation for the exercise;Relaxation education Stress management education;Encouraged to attend Cardiac Rehabilitation for the exercise;Relaxation education Encouraged to attend Cardiac Rehabilitation for the exercise     Continue Psychosocial Services  Follow up required by staff No Follow up required No Follow up required        Psychosocial Discharge (Final Psychosocial Re-Evaluation):     Psychosocial Re-Evaluation - 10/17/16 1056      Psychosocial Re-Evaluation   Current issues with None Identified   Interventions Encouraged to attend Cardiac Rehabilitation for the exercise   Continue Psychosocial Services  No Follow up required      Vocational Rehabilitation: Provide vocational rehab assistance to qualifying candidates.   Vocational Rehab Evaluation & Intervention:     Vocational Rehab - 07/22/16 1329      Initial Vocational Rehab Evaluation & Intervention   Assessment shows need for Vocational Rehabilitation No  Pt does not plan to return to competive employment      Education: Education Goals: Education classes will be provided on a weekly basis, covering required topics. Participant will state understanding/return demonstration of topics presented.  Learning Barriers/Preferences:     Learning Barriers/Preferences - 07/22/16 0908      Learning Barriers/Preferences   Learning Barriers None   Learning Preferences Written Material;Skilled Demonstration;Audio      Education Topics: Count Your Pulse:  -Group instruction provided by verbal instruction, demonstration, patient participation and written materials to support subject.  Instructors address importance of being able to find your pulse and how to count your pulse when at home without a heart monitor.  Patients get hands on experience counting their pulse with staff help and individually.   CARDIAC REHAB PHASE II EXERCISE from 10/08/2016 in [facility]  Date  09/17/16  Instruction Review Code  2- meets goals/outcomes      Heart Attack, Angina, and Risk Factor Modification:  -Group instruction provided by verbal instruction, video, and written materials to support subject.  Instructors address signs and symptoms of angina and heart attacks.    Also discuss risk factors for heart disease and how to make changes to improve heart health risk factors.   CARDIAC REHAB PHASE II EXERCISE from 10/08/2016 in [place]  Date  09/29/16  Instruction Review Code  2- meets goals/outcomes  Functional Fitness:  -Group instruction provided by verbal instruction, demonstration, patient participation, and written materials to support subject.  Instructors address safety measures for doing things around the house.  Discuss how to get up and down off the floor, how to pick things up properly, how to safely get out of a chair without assistance, and balance training.   CARDIAC REHAB PHASE II EXERCISE from 10/08/2016 in [facility]  Date  10/01/16  Instruction Review Code  2- meets goals/outcomes      Meditation and Mindfulness:  -Group instruction provided by verbal instruction, patient participation, and written materials to support subject.  Instructor addresses importance of mindfulness and meditation practice to help reduce stress and improve awareness.  Instructor also leads participants through a meditation exercise.    CARDIAC REHAB PHASE II EXERCISE from 10/08/2016 in [place]  Date  08/25/16  Instruction Review Code  2- meets goals/outcomes      Stretching for Flexibility and Mobility:  -Group instruction provided by verbal instruction, patient participation, and written materials to support subject.  Instructors lead participants through series of stretches that are designed to increase flexibility thus improving mobility.   These stretches are additional exercise for major muscle groups that are typically performed during regular warm up and cool down.   CARDIAC REHAB PHASE II EXERCISE from 10/08/2016 in [place]  Date  09/03/16  Educator  EP  Instruction Review Code  R- Review/reinforce      Hands Only CPR:  -Group verbal, video, and participation provides a basic overview of AHA guidelines for community CPR. Role-play of emergencies allow participants the opportunity to practice calling for help and chest compression technique with discussion of AED use.   Hypertension: -Group verbal and written instruction that provides a basic overview of hypertension including the most recent diagnostic guidelines, risk factor reduction with self-care instructions and medication management.   CARDIAC REHAB PHASE II EXERCISE from 10/08/2016 in [facility]  Date  10/08/16  Instruction Review Code  2- meets goals/outcomes       Nutrition I class: Heart Healthy Eating:  -Group instruction provided by PowerPoint slides, verbal discussion, and written materials to support subject matter. The instructor gives an explanation and review of the Therapeutic Lifestyle Changes diet recommendations, which includes a discussion on lipid goals, dietary fat, sodium, fiber, plant stanol/sterol esters, sugar, and the components of a well-balanced, healthy diet.   Nutrition II class: Lifestyle Skills:  -Group instruction provided by PowerPoint slides, verbal discussion, and written materials to support subject matter. The instructor gives an explanation and review of label reading, grocery shopping for heart health, heart healthy recipe modifications, and ways to make healthier choices when eating out.   Diabetes Question & Answer:  -Group instruction provided by PowerPoint slides, verbal discussion, and written materials to support subject matter. The instructor gives an  explanation and review of diabetes co-morbidities, pre- and post-prandial blood glucose goals, pre-exercise blood glucose goals, signs, symptoms, and treatment of hypoglycemia and hyperglycemia, and foot care basics.   Diabetes Blitz:  -Group instruction provided by PowerPoint slides, verbal discussion, and written materials to support subject matter. The instructor gives an explanation and review of the physiology behind type 1 and type 2 diabetes, diabetes medications and rational behind using different medications, pre- and post-prandial blood glucose recommendations and Hemoglobin A1c goals, diabetes diet, and exercise including blood glucose guidelines for exercising safely.  Portion Distortion:  -Group instruction provided by PowerPoint slides, verbal discussion, written materials, and food models to support subject matter. The instructor gives an explanation of serving size versus portion size, changes in portions sizes over the last 20 years, and what consists of a serving from each food group.   CARDIAC REHAB PHASE II EXERCISE from 10/08/2016 in [place]  Date  09/01/16  Educator  RD  Instruction Review Code  2- meets goals/outcomes      Stress Management:  -Group instruction provided by verbal instruction, video, and written materials to support subject matter.  Instructors review role of stress in heart disease and how to cope with stress positively.     Exercising on Your Own:  -Group instruction provided by verbal instruction, power point, and written materials to support subject.  Instructors discuss benefits of exercise, components of exercise, frequency and intensity of exercise, and end points for exercise.  Also discuss use of nitroglycerin and activating EMS.  Review options of places to exercise outside of rehab.  Review guidelines for sex with heart disease.   CARDIAC REHAB PHASE II EXERCISE from 10/08/2016 in [place]  Date  09/15/16  Instruction Review Code  2- meets goals/outcomes      Cardiac Drugs I:  -Group instruction provided by verbal instruction and written materials to support subject.  Instructor reviews cardiac drug classes: antiplatelets, anticoagulants, beta blockers, and statins.  Instructor discusses reasons, side effects, and lifestyle considerations for each drug class.   CARDIAC REHAB PHASE II EXERCISE from 10/08/2016 in [place]  Date  09/22/16  Instruction Review Code  2- meets goals/outcomes      Cardiac Drugs II:  -Group instruction provided by verbal instruction and written materials to support subject.  Instructor reviews cardiac drug classes: angiotensin converting enzyme inhibitors (ACE-I), angiotensin II receptor blockers (ARBs), nitrates, and calcium channel blockers.  Instructor discusses reasons, side effects, and lifestyle considerations for each drug class.   Anatomy and Physiology of the Circulatory System:  Group verbal and written instruction and models provide basic cardiac anatomy and physiology, with the coronary electrical and arterial systems. Review of: AMI, Angina, Valve disease, Heart Failure, Peripheral Artery Disease, Cardiac Arrhythmia, Pacemakers, and the ICD.   CARDIAC REHAB PHASE II EXERCISE from 10/08/2016 in [place]  Date  10/06/16  Instruction Review Code  2- meets goals/outcomes      Other Education:  -Group or individual verbal, written, or video instructions that support the educational goals of the cardiac rehab program.   Knowledge Questionnaire Score:     Knowledge Questionnaire Score - 07/22/16 1016      Knowledge Questionnaire Score   Pre Score 18/24      Core Components/Risk Factors/Patient Goals at Admission:     Personal Goals and Risk Factors at Admission - 07/22/16 1018      Core Components/Risk Factors/Patient Goals on Admission    Hypertension Yes   Intervention Provide education on lifestyle modifcations including regular physical activity/exercise, weight management, moderate sodium restriction and increased consumption of fresh fruit, vegetables, and low fat dairy, alcohol moderation, and smoking cessation.;Monitor prescription use compliance.   Expected Outcomes Short Term: Continued assessment and intervention until BP is < 140/43mm HG in hypertensive participants. < 130/22mm HG in hypertensive participants with diabetes, heart failure or chronic kidney disease.;Long Term: Maintenance of blood pressure at goal levels.   Lipids Yes  Intervention Provide education and support for participant on nutrition & aerobic/resistive exercise along with prescribed medications to achieve LDL 70mg , HDL >40mg .   Expected Outcomes Short Term: Participant states understanding of desired cholesterol values and is compliant with medications prescribed. Participant is following exercise prescription and nutrition guidelines.;Long Term: Cholesterol controlled with medications as prescribed, with individualized exercise RX and with personalized nutrition plan. Value goals: LDL < 70mg , HDL > 40 mg.      Core Components/Risk Factors/Patient Goals Review:      Goals and Risk Factor Review    Row Name 08/23/16 1233 09/21/16 2207 10/17/16 1054         Core Components/Risk Factors/Patient Goals Review   Personal Goals Review Lipids;Hypertension Lipids;Hypertension Lipids;Hypertension     Review Pt is making progress toward meeting his goals.  Pt bp readings have been within normal limits.  Pt is expecting blood work to [employer] [facility] the efficacy of his medicaitons, diet and exercise for lowering his total cholesterol. Pt is making progress toward meeting his goals.  Pt bp readings have been within normal limits.  Pt is expecting blood work to [facility] the efficacy of his medicaitons, diet and exercise for lowering his total cholesterol. No new lab  results noted in Epic,. Pt is making progress toward meeting his goals.  Pt bp readings have been within normal limits however pt has periodic elevation in exertional bp.   Pt is expecting blood work to [facility] the efficacy of his medicaitons, diet and exercise for lowering his total cholesterol. No new lab results noted in Epic,.     Expected Outcomes Pt bp and lipid panel will contineu to show improvement or be at goal. Pt  exertional bp and lipid panel will contineu to show improvement or be at goal. Pt  exertional bp and lipid panel will continue to show improvement or be at goal. Pt has upcoming appt with Dr. Burt Knack on 12/13.        Core Components/Risk Factors/Patient Goals at Discharge (Final Review):      Goals and Risk Factor Review - 10/17/16 1054      Core Components/Risk Factors/Patient Goals Review   Personal Goals Review Lipids;Hypertension   Review Pt is making progress toward meeting his goals.  Pt bp readings have been within normal limits however pt has periodic elevation in exertional bp.   Pt is expecting blood work to [facility] the efficacy of his medicaitons, diet and exercise for lowering his total cholesterol. No new lab results noted in Epic,.   Expected Outcomes Pt  exertional bp and lipid panel will continue to show improvement or be at goal. Pt has upcoming appt with Dr. Burt Knack on 12/13.      ITP Comments:     ITP Comments    Row Name 07/22/16 0843 10/19/16 1518 10/19/16 1519       ITP Comments Medical Director, Dr. Fransico Him Medical Director, Dr. Fransico Him Medical Director, Dr. Fransico Him        Comments:  Timmothy Sours is making expected progress toward personal goals after completing 32 sessions. Psychosocial Assessment - No psychosocial needs identified at this time, no psychosocial interventions necessary. 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 Pt feels supportive in his rehab participation Pt is nearing graduation. Recommend continued exercise  and life style modification education including  stress management and relaxation techniques to decrease cardiac risk profile. Cherre Huger, BSN Cardiac and Training and development officer

## 2016-10-18 ENCOUNTER — Encounter (HOSPITAL_COMMUNITY): Payer: Medicare Other

## 2016-10-18 ENCOUNTER — Encounter (HOSPITAL_COMMUNITY)
Admission: RE | Admit: 2016-10-18 | Discharge: 2016-10-18 | Disposition: A | Discharge status: Home / Self Care | Payer: Medicare Other | Source: Ambulatory Visit | Attending: Cardiovascular Disease | Admitting: Cardiovascular Disease

## 2016-10-18 DIAGNOSIS — Z955 Presence of coronary angioplasty implant and graft: Secondary | ICD-10-CM | POA: Diagnosis not present

## 2016-10-19 DIAGNOSIS — Z23 Encounter for immunization: Secondary | ICD-10-CM | POA: Diagnosis not present

## 2016-10-20 ENCOUNTER — Encounter (HOSPITAL_COMMUNITY)
Admission: RE | Admit: 2016-10-20 | Discharge: 2016-10-20 | Disposition: A | Discharge status: Home / Self Care | Payer: Medicare Other | Source: Ambulatory Visit | Attending: Cardiovascular Disease | Admitting: Cardiovascular Disease

## 2016-10-20 ENCOUNTER — Encounter (HOSPITAL_COMMUNITY): Payer: Medicare Other

## 2016-10-20 DIAGNOSIS — Z955 Presence of coronary angioplasty implant and graft: Secondary | ICD-10-CM

## 2016-10-22 ENCOUNTER — Encounter (HOSPITAL_COMMUNITY): Payer: Medicare Other

## 2016-10-25 ENCOUNTER — Encounter (HOSPITAL_COMMUNITY): Payer: Self-pay

## 2016-10-25 ENCOUNTER — Encounter (HOSPITAL_COMMUNITY)
Admission: RE | Admit: 2016-10-25 | Discharge: 2016-10-25 | Disposition: A | Discharge status: Home / Self Care | Payer: Medicare Other | Source: Ambulatory Visit | Attending: Cardiovascular Disease | Admitting: Cardiovascular Disease

## 2016-10-25 ENCOUNTER — Encounter (HOSPITAL_COMMUNITY): Payer: Medicare Other

## 2016-10-25 DIAGNOSIS — Z955 Presence of coronary angioplasty implant and graft: Secondary | ICD-10-CM | POA: Diagnosis not present

## 2016-10-25 DIAGNOSIS — R634 Abnormal weight loss: Secondary | ICD-10-CM | POA: Diagnosis not present

## 2016-10-25 DIAGNOSIS — J069 Acute upper respiratory infection, unspecified: Secondary | ICD-10-CM | POA: Diagnosis not present

## 2016-10-25 MED FILL — PROMETHAZINE-CODEINE SYRUP: 6.25-10 | 4 days supply | Qty: 120 | Fill #0

## 2016-10-25 NOTE — Progress Notes (Signed)
Discharge Progress Report  Patient Details  Name: Jesus Harmon MRN: [id number] Date of Birth: 22-Aug-1929 Referring Provider:     CARDIAC REHAB PHASE II ORIENTATION from 07/22/2016 in [place]  Referring Provider  Sherren Mocha        Number of Visits: 35  Reason for Discharge:  Patient reached a stable level of exercise. Patient independent in their exercise. Patient has met program and personal goals.  Smoking History:  History  Smoking Status  . Former Smoker  . Packs/day: 1.00  . Years: 18.00  . Quit date: 01/12/1971  Smokeless Tobacco  . Never Used    Diagnosis:  06/16/16 Status post coronary artery stent placement  ADL UCSD:   Initial Exercise Prescription:     Initial Exercise Prescription - 07/22/16 1000      Date of Initial Exercise RX and Referring Provider   Date 07/22/16   Referring Provider Sherren Mocha      Treadmill   MPH 1.5   Grade 0   Minutes 10   METs 2.15     Recumbant Bike   Level 1.5   Minutes 10   METs 2     NuStep   Level 2   SPM 60   Minutes 10   METs 1.7     Prescription Details   Frequency (times per week) 3   Duration Progress to 30 minutes of continuous aerobic without signs/symptoms of physical distress     Intensity   THRR 40-80% of Max Heartrate 54-108   Ratings of Perceived Exertion 11-15   Perceived Dyspnea 0-4     Progression   Progression Continue to progress workloads to maintain intensity without signs/symptoms of physical distress.     Resistance Training   Training Prescription Yes   Weight 2lbs   Reps 10-15      Discharge Exercise Prescription (Final Exercise Prescription Changes):     Exercise Prescription Changes - 10/25/16 1421      Response to Exercise   Blood Pressure (Admit) 122/60   Blood Pressure (Exercise) 162/60   Blood Pressure (Exit) 140/70   Heart Rate (Admit) 62 bpm   Heart Rate (Exercise) 119 bpm   Heart Rate (Exit) 74 bpm   Rating of  Perceived Exertion (Exercise) 12   Symptoms none   Duration Continue with 30 min of aerobic exercise without signs/symptoms of physical distress.   Intensity THRR unchanged     Progression   Progression Continue to progress workloads to maintain intensity without signs/symptoms of physical distress.   Average METs 3.3     Resistance Training   Training Prescription Yes   Weight 3lbs   Reps 10-15   Time 10 Minutes     Treadmill   MPH 2.5   Grade 0   Minutes 10   METs 2.91     Recumbant Bike   Level 3   Minutes 10   METs 3.5     NuStep   Level 4   SPM 90   Minutes 10   METs 3.6     Home Exercise Plan   Plans to continue exercise at Home (comment)  walking   Frequency Add 3 additional days to program exercise sessions.   Initial Home Exercises Provided 08/06/16      Functional Capacity:     6 Minute Walk    Row Name 07/22/16 0901 07/22/16 1017 10/15/16 1024     6 Minute Walk   Phase Initial  -  Discharge   Distance  - 888 feet 1502 feet   Distance Feet Change  -  - 614 ft   Walk Time 6 minutes  - 6 minutes   # of Rest Breaks  - 0 0   MPH  - 1.68 2.84   METS  - 1.62 2.74   RPE  - 11 12   VO2 Peak  - 5.68 9.6   Symptoms  - No No   Resting HR  - 69 bpm 66 bpm   Resting BP  - 144/70 122/64   Max Ex. HR  - 90 bpm 93 bpm   Max Ex. BP  - 142/76 142/72   2 Minute Post BP  - 122/58 136/64   Row Name 10/15/16 1025         6 Minute Walk   Distance % Change 69.14 %        Psychological, QOL, Others - Outcomes: PHQ 2/9: Depression screen [facility] 2/9 10/25/2016 07/26/2016  Decreased Interest 0 0  Down, Depressed, Hopeless 0 0  PHQ - 2 Score 0 0    Quality of Life:     Quality of Life - 10/25/16 1145      Quality of Life Scores   Health/Function Pre 27.54 %   Health/Function Post 26.08 %   Health/Function % Change -5.3 %   Socioeconomic Pre 26.29 %   Socioeconomic Post 25.42 %   Socioeconomic % Change  -3.31 %   Psych/Spiritual Pre 27.36 %    Psych/Spiritual Post 25.93 %   Psych/Spiritual % Change -5.23 %   Family Pre 30 %   Family Post 29.17 %   Family % Change -2.77 %   GLOBAL Pre 27.61 %   GLOBAL Post 26.22 %   GLOBAL % Change -5.03 %      Personal Goals: Goals established at orientation with interventions provided to work toward goal.     Personal Goals and Risk Factors at Admission - 07/22/16 1018      Core Components/Risk Factors/Patient Goals on Admission   Hypertension Yes   Intervention Provide education on lifestyle modifcations including regular physical activity/exercise, weight management, moderate sodium restriction and increased consumption of fresh fruit, vegetables, and low fat dairy, alcohol moderation, and smoking cessation.;Monitor prescription use compliance.   Expected Outcomes Short Term: Continued assessment and intervention until BP is < 140/61m HG in hypertensive participants. < 130/843mHG in hypertensive participants with diabetes, heart failure or chronic kidney disease.;Long Term: Maintenance of blood pressure at goal levels.   Lipids Yes   Intervention Provide education and support for participant on nutrition & aerobic/resistive exercise along with prescribed medications to achieve LDL <7090mHDL >42m53m Expected Outcomes Short Term: Participant states understanding of desired cholesterol values and is compliant with medications prescribed. Participant is following exercise prescription and nutrition guidelines.;Long Term: Cholesterol controlled with medications as prescribed, with individualized exercise RX and with personalized nutrition plan. Value goals: LDL < 70mg86mL > 40 mg.       Personal Goals Discharge:     Goals and Risk Factor Review    Row Name 08/23/16 1233 09/21/16 2207 10/17/16 1054         Core Components/Risk Factors/Patient Goals Review   Personal Goals Review Lipids;Hypertension Lipids;Hypertension Lipids;Hypertension     Review Pt is making progress toward  meeting his goals.  Pt bp readings have been within normal limits.  [facility], diet  and exercise for lowering his total cholesterol. Pt is making progress toward meeting his goals.  Pt bp readings have been within normal limits.  Pt is expecting blood work to [facility] the efficacy of his medicaitons, diet and exercise for lowering his total cholesterol. No new lab results noted in Epic,. Pt is making progress toward meeting his goals.  Pt bp readings have been within normal limits however pt has periodic elevation in exertional bp.   Pt is expecting blood work to [facility] the efficacy of his medicaitons, diet and exercise for lowering his total cholesterol. No new lab results noted in Epic,.     Expected Outcomes Pt bp and lipid panel will contineu to show improvement or be at goal. Pt  exertional bp and lipid panel will contineu to show improvement or be at goal. Pt  exertional bp and lipid panel will continue to show improvement or be at goal. Pt has upcoming appt with Dr. Burt Knack on 12/13.        Exercise Goals and Review:     Exercise Goals    Row Name 07/22/16 0959             Exercise Goals   Increase Physical Activity Yes       Intervention Provide advice, education, support and counseling about physical activity/exercise needs.;Develop an individualized exercise prescription for aerobic and resistive training based on initial evaluation findings, risk stratification, comorbidities and participant's personal goals.       Expected Outcomes Achievement of increased cardiorespiratory fitness and enhanced flexibility, muscular endurance and strength shown through measurements of functional capacity and personal statement of participant.       Increase Strength and Stamina Yes       Intervention Provide advice, education, support and counseling about physical activity/exercise needs.;Develop an individualized exercise prescription for  aerobic and resistive training based on initial evaluation findings, risk stratification, comorbidities and participant's personal goals.       Expected Outcomes Achievement of increased cardiorespiratory fitness and enhanced flexibility, muscular endurance and strength shown through measurements of functional capacity and personal statement of participant.          Nutrition & Weight - Outcomes:     Pre Biometrics - 07/22/16 1017      Pre Biometrics   Waist Circumference 38 inches   Hip Circumference 38 inches   Waist to Hip Ratio 1 %   Triceps Skinfold 18 mm   % Body Fat 26.3 %   Grip Strength 41 kg   Flexibility 0 in   Single Leg Stand 0 seconds         Post Biometrics - 10/15/16 1025       Post  Biometrics   Height 5' 8.5" (1.74 m)   Weight 153 lb 3.5 oz (69.5 kg)   Waist Circumference 35.5 inches   Hip Circumference 38 inches   Waist to Hip Ratio 0.93 %   BMI (Calculated) 22.96   Triceps Skinfold 20 mm   % Body Fat 25.8 %   Grip Strength 38 kg   Flexibility 0 in   Single Leg Stand 1.09 seconds      Nutrition:     Nutrition Therapy & Goals - 07/22/16 1229      Nutrition Therapy   Diet Therapeutic Lifestyle Changes     Intervention Plan   Intervention Prescribe, educate and counsel regarding individualized specific dietary modifications aiming towards targeted core components such as weight, hypertension, lipid management, diabetes, heart failure and other  comorbidities.   Expected Outcomes Short Term Goal: Understand basic principles of dietary content, such as calories, fat, sodium, cholesterol and nutrients.;Long Term Goal: Adherence to prescribed nutrition plan.      Nutrition Discharge:     Nutrition Assessments - 10/29/16 0915      MEDFICTS Scores   Pre Score 67   Post Score 30   Score Difference -37      Education Questionnaire Score:     Knowledge Questionnaire Score - 10/25/16 1135      Knowledge Questionnaire Score   Post Score  23/24      Goals reviewed with patient Pt graduated from cardiac rehab program today with completion of 35 exercise sessions in Phase II. Pt maintained good attendance and progressed nicely during his participation in rehab as evidenced by increased MET level.   Medication list reconciled. Repeat  PHQ score-0. Pt completed post assessment quality of life survey. Pt scored the following:     Quality of Life - 10/25/16 1145      Quality of Life Scores   Health/Function Pre 27.54 %   Health/Function Post 26.08 %   Health/Function % Change -5.3 %   Socioeconomic Pre 26.29 %   Socioeconomic Post 25.42 %   Socioeconomic % Change  -3.31 %   Psych/Spiritual Pre 27.36 %   Psych/Spiritual Post 25.93 %   Psych/Spiritual % Change -5.23 %   Family Pre 30 %   Family Post 29.17 %   Family % Change -2.77 %   GLOBAL Pre 27.61 %   GLOBAL Post 26.22 %   GLOBAL % Change -5.03 %    Pt has made significant lifestyle changes and should be commended for his success. Pt feels he has achieved his goals during cardiac rehab.  Pt feels he is in better shape and has increase strength.  Pt plans to continue exercise at home with walking 30 minutes every day and riding his bike/helment on good weather days. Cherre Huger, BSN Cardiac and Training and development officer

## 2016-10-27 ENCOUNTER — Encounter (HOSPITAL_COMMUNITY): Payer: Medicare Other

## 2016-10-28 DIAGNOSIS — S76212A Strain of adductor muscle, fascia and tendon of left thigh, initial encounter: Secondary | ICD-10-CM | POA: Diagnosis not present

## 2016-10-29 ENCOUNTER — Encounter (HOSPITAL_COMMUNITY): Payer: Medicare Other

## 2016-11-02 MED FILL — CIPROFLOXACIN HCL 500 MG TA: 500 | 10 days supply | Qty: 20 | Fill #0

## 2016-11-02 MED FILL — traMADol HCL 50 MG TABS: 50 | 5 days supply | Qty: 20 | Fill #0

## 2016-11-04 ENCOUNTER — Telehealth: Payer: Self-pay | Admitting: Cardiovascular Disease

## 2016-11-04 NOTE — Telephone Encounter (Signed)
New Message  Pt c/o medication issue:  1. Name of Medication: Simvastatin   2. How are you currently taking this medication (dosage and times per day)? 20mg    3. Are you having a reaction (difficulty breathing--STAT)? no  4. What is your medication issue? Per pt daughter would like to speak with RN about pt medical Doctor taking pt off of is medication .please call back to discuss

## 2016-11-04 NOTE — Telephone Encounter (Signed)
[name], the patient's daughter, called to report the patient went to his PCP and was placed on Cipro for 10 days for diverticulitis.  The PCP told him he would have to cut his Zocor in half while he is taking the Cipro and she wanted to ask if it is OK to do that because of the known plaque build-up. Reiterated to her that 10 days is a short amount of time and to take 1/2 dose for the 10 days and resume full dose when OK with PCP.  She was grateful for call and agrees with treatment plan.

## 2016-11-04 NOTE — Telephone Encounter (Signed)
Left message to call back  

## 2016-11-04 NOTE — Telephone Encounter (Signed)
Mild interaction - likely would have been ok continuing simvastatin 20mg  daily. Also ok with dose reduction since this is for a short period of time, then resuming normal simvastatin dose after cipro course is completed.

## 2016-11-08 DIAGNOSIS — K4090 Unilateral inguinal hernia, without obstruction or gangrene, not specified as recurrent: Secondary | ICD-10-CM | POA: Diagnosis not present

## 2016-11-08 MED FILL — SIMVASTATIN 20 MG TABLET: 20 | 90 days supply | Qty: 90 | Fill #2

## 2016-11-11 MED FILL — PROMETHAZINE W/COD SYRUP: 6.25-10 | 4 days supply | Qty: 120 | Fill #0

## 2016-11-16 MED FILL — CLOPIDOGREL 75 MG TABLET: 75 | 30 days supply | Qty: 30 | Fill #5

## 2016-11-16 MED FILL — LATANOPROST 0.005% EYE DRP: 0.005 | 25 days supply | Qty: 3 | Fill #3

## 2016-11-17 DIAGNOSIS — K4090 Unilateral inguinal hernia, without obstruction or gangrene, not specified as recurrent: Secondary | ICD-10-CM | POA: Diagnosis not present

## 2016-11-17 DIAGNOSIS — I2510 Atherosclerotic heart disease of native coronary artery without angina pectoris: Secondary | ICD-10-CM | POA: Diagnosis not present

## 2016-11-24 ENCOUNTER — Telehealth: Payer: Self-pay

## 2016-11-24 NOTE — Telephone Encounter (Signed)
Scheduled patient tomorrow with Dr. Burt Knack. He was grateful for call and agrees with treatment plan.

## 2016-11-24 NOTE — Telephone Encounter (Signed)
-----   Message from Sherren Mocha, MD sent at 11/24/2016 12:55 PM EST ----- Cathe Mons - this is a patient who needs a pre-op assessment. Could you put him on for my add-on clinic next Tuesday or get him on one of the APP schedules?   thx

## 2016-11-25 ENCOUNTER — Ambulatory Visit: Payer: Medicare Other | Admitting: Cardiovascular Disease

## 2016-11-25 ENCOUNTER — Encounter (INDEPENDENT_AMBULATORY_CARE_PROVIDER_SITE_OTHER): Payer: Self-pay

## 2016-11-25 ENCOUNTER — Encounter: Payer: Self-pay | Admitting: Cardiovascular Disease

## 2016-11-25 VITALS — BP 132/60 | HR 58 | Ht 72.0 in | Wt 153.4 lb

## 2016-11-25 DIAGNOSIS — E785 Hyperlipidemia, unspecified: Secondary | ICD-10-CM | POA: Diagnosis not present

## 2016-11-25 DIAGNOSIS — I2510 Atherosclerotic heart disease of native coronary artery without angina pectoris: Secondary | ICD-10-CM | POA: Diagnosis not present

## 2016-11-25 DIAGNOSIS — Z01810 Encounter for preprocedural cardiovascular examination: Secondary | ICD-10-CM | POA: Diagnosis not present

## 2016-11-25 DIAGNOSIS — I10 Essential (primary) hypertension: Secondary | ICD-10-CM

## 2016-11-25 NOTE — Patient Instructions (Signed)
Medication Instructions:  Your provider recommends that you continue on your current medications as directed. Please refer to the Current Medication list given to you today.    Labwork: None  Testing/Procedures: None  Follow-Up: Your provider wants you to follow-up in: 1 year with Dr. Burt Knack. You will receive a reminder letter in the mail two months in advance. If you don't receive a letter, please call our office to schedule the follow-up appointment.    Any Other Special Instructions Will Be Listed Below (If Applicable). You have been cleared to have surgery. You may hold your Plavix 5-7 days (as deemed by your surgeon) as long as you wait until after December 11, 2016 to hold.    If you need a refill on your cardiac medications before your next appointment, please call your pharmacy.

## 2016-11-25 NOTE — Progress Notes (Signed)
Cardiology Office Note Date:  11/25/2016   ID:  [name], DOB 09/12/1929, MRN [id number]  PCP:  Aurea Graff.Marlou Sa, MD  Cardiologist:  Sherren Mocha, MD    Chief Complaint  Patient presents with  . Coronary Artery Disease     History of Present Illness: Jesus Harmon is a 83 y.o. male who presents for preoperative cardiovascular assessment.  The patient has a history of coronary artery disease.  He developed exertional angina in May of this year and underwent evaluation with stress testing and echo studies.  There was no ischemia demonstrated, but with typical symptoms cardiac catheterization was recommended.  He was found to have severe 90% stenosis of the right coronary artery treated with PCI using a 4.0 mm drug-eluting stent.  He was noted to have moderate proximal LAD stenosis and FFR was performed.  This was positive and PCI was attempted.  Because of severe calcification, balloon angioplasty was unsuccessful due to inability to expand the balloon.  With the patient return for follow-up and was completely asymptomatic.  We discussed consideration of atherectomy and stenting of the LAD, but decided to manage him medically since he had no symptoms.  He has now completed 3 months of cardiac rehab and had no symptoms with any of his exercise.  Unfortunately, over the last 4 weeks he has developed increasing problems with that left inguinal hernia and has been unable to do much activity because of this.  He continues to be asymptomatic from a cardiac perspective.  He specifically denies chest pain, chest pressure, or shortness of breath.   Past Medical History:  Diagnosis Date  . Arthritis    back and neck (06/16/2016)  . CAD (coronary artery disease)    a. LHC 10/2009:  pLAD 50%, small D1 70-80% (too small for PCI), EF 65% => med Rx  b. Myoview 10/2009: EF 65%, no ischemia;  c.  ETT-MV 3/14:  No ischemia, EF 63%  . History of kidney stones    "passed them"  . HLD (hyperlipidemia)     "on RX; never had high cholestrol" (06/16/2016)  . HTN (hypertension)    "on RX; never HTN" (06/16/2016)  . Migraine    "in high school" (06/16/2016)    Past Surgical History:  Procedure Laterality Date  . BASAL CELL CARCINOMA EXCISION     "one of my ears; big one on my nose; one on my left shoulder" (06/16/2016)  . CARDIAC CATHETERIZATION  2017   "treated w/RX"  . CATARACT EXTRACTION W/ INTRAOCULAR LENS  IMPLANT, BILATERAL Bilateral   . CORONARY ANGIOPLASTY WITH STENT PLACEMENT  06/16/2016  . CORONARY BALLOON ANGIOPLASTY N/A 06/16/2016   Procedure: Coronary Balloon Angioplasty;  Surgeon: Sherren Mocha, MD;  Location: [facility];  Service: Cardiovascular;  Laterality: N/A;  Prox LAD  . CORONARY STENT INTERVENTION  06/16/2016   Procedure: Coronary Stent Intervention;  Surgeon: Sherren Mocha, MD;  Location: [facility];  Service: Cardiovascular;;  Synergy 4.0x16 to Mid RCA  . INTRAVASCULAR PRESSURE WIRE/FFR STUDY N/A 06/16/2016   Procedure: Intravascular Pressure Wire/FFR Study;  Surgeon: Sherren Mocha, MD;  Location: [facility];  Service: Cardiovascular;  Laterality: N/A;  Prox LAD  . KNEE ARTHROSCOPY     "not sure which side"  . LEFT HEART CATH AND CORONARY ANGIOGRAPHY N/A 06/16/2016   Procedure: Left Heart Cath and Coronary Angiography;  Surgeon: Sherren Mocha, MD;  Location: [facility];  Service: Cardiovascular;  Laterality: N/A;  .  TONSILLECTOMY    . TRANSURETHRAL RESECTION OF PROSTATE N/A 03/04/2014   Procedure: TRANSURETHRAL RESECTION OF THE PROSTATE, TRANSURETHRAL RESECTION OF THE BLADDER TUMOR;  Surgeon: Ailene Rud, MD;  Location: [facility];  Service: Urology;  Laterality: N/A;    Current Outpatient Medications  Medication Sig Dispense Refill  . acetaminophen (TYLENOL) 500 MG chewable tablet Chew 500 mg by mouth every 6 (six) hours as needed for pain.    Marland Kitchen aspirin EC 81 MG tablet Take 81 mg by mouth daily.    . clopidogrel (PLAVIX) 75 MG  tablet Take 1 tablet (75 mg total) by mouth daily with breakfast. 30 tablet 11  . fexofenadine (ALLEGRA) 180 MG tablet Take 180 mg by mouth daily.    Marland Kitchen latanoprost (XALATAN) 0.005 % ophthalmic solution Place 1 drop into both eyes at bedtime.     Marland Kitchen lisinopril (PRINIVIL,ZESTRIL) 10 MG tablet Take 1 tablet (10 mg total) by mouth daily. 90 tablet 3  . nitroGLYCERIN (NITROSTAT) 0.4 MG SL tablet Place 1 tablet (0.4 mg total) under the tongue every 5 (five) minutes as needed for chest pain. 25 tablet 5  . simvastatin (ZOCOR) 20 MG tablet Take 1 tablet (20 mg total) by mouth daily. 90 tablet 3   No current facility-administered medications for this visit.     Allergies:   Amoxicillin; Sulfonamide derivatives; and Sulfamethoxazole   Social History:  The patient  reports that he quit smoking about 45 years ago. He has a 18.00 pack-year smoking history. he has never used smokeless tobacco. He reports that he does not drink alcohol or use drugs.   Family History:  The patient's  family history includes COPD in his father; Heart failure in his father; Sudden death in his mother.    ROS:  Please see the history of present illness.  All other systems are reviewed and negative.    PHYSICAL EXAM: VS:  BP 132/60   Pulse (!) 58   Ht 6' (1.829 m)   Wt 153 lb 6.4 oz (69.6 kg)   BMI 20.80 kg/m  , BMI Body mass index is 20.8 kg/m. GEN: Well nourished, well developed, pleasant elderly male in no acute distress  HEENT: normal  Neck: no JVD, no masses. No carotid bruits Cardiac: RRR without murmur or gallop     Respiratory:  clear to auscultation bilaterally, normal work of breathing GI: soft, nontender, nondistended, + BS MS: no deformity or atrophy  Ext: no pretibial edema, pedal pulses 2+= bilaterally Skin: warm and dry, no rash Neuro:  Strength and sensation are intact Psych: euthymic mood, full affect  EKG:  EKG is ordered today. The ekg ordered today shows sinus bradycardia 58 bpm, otherwise  within normal limits.  Recent Labs: 06/17/2016: BUN 16; Creatinine, Ser 1.11; Hemoglobin 10.6; Platelets 232; Potassium 4.5; Sodium 139   Lipid Panel     Component Value Date/Time   CHOL 153 11/06/2010 1014   TRIG 34.0 11/06/2010 1014   HDL 82.40 11/06/2010 1014   CHOLHDL 2 11/06/2010 1014   VLDL 6.8 11/06/2010 1014   LDLCALC 64 11/06/2010 1014      Wt Readings from Last 3 Encounters:  11/25/16 153 lb 6.4 oz (69.6 kg)  10/15/16 153 lb 3.5 oz (69.5 kg)  07/22/16 155 lb 3.3 oz (70.4 kg)     Cardiac Studies Reviewed: Cardiac Cath 06-16-2016: Conclusion   1. Severe stenosis of the mid right coronary artery treated successfully with PTCA and stenting using a 4.0 x 16 mm Synergy  DES 2. Severe proximal LAD stenosis confirmed by FFR analysis (0.72) but unsuccessful angioplasty because of inability to expand a balloon in this rigid lesion 3. Widely patent left main and left circumflex 4. Normal LV function by noninvasive assessment  Recommendation: The patient will be observed overnight. Will continue dual antiplatelet therapy with aspirin and clopidogrel for 12 months. Consider atherectomy and PCI of the LAD lesion in a few weeks after reassessment of the patient in the outpatient setting.  Indications   Angina pectoris ([place]) [I20.9 (ICD-10-CM)]  Procedural Details/Technique   Technical Details INDICATION: Exertional angina, crescendo pattern, CCS Class 3 symptoms  PROCEDURAL DETAILS: The right wrist was prepped, draped, and anesthetized with 1% lidocaine. Using the modified Seldinger technique, a 5/6 French Slender sheath was introduced into the right radial artery. 3 mg of verapamil was administered through the sheath, weight-based unfractionated heparin was administered intravenously. Standard Judkins catheters were used for selective coronary angiography. LV pressure is recorded with a JR4 catheter. PCI of the RCA is performed after the diagnostic procedure. FFR and PCI of the LAD  are performed. Catheter exchanges were performed over an exchange length guidewire. There were no immediate procedural complications. A TR band was used for radial hemostasis at the completion of the procedure. The patient was transferred to the post catheterization recovery area for further monitoring.    Estimated blood loss <50 mL.  During this procedure the patient was administered the following to achieve and maintain moderate conscious sedation: Versed 3 mg, Fentanyl 75 mcg, while the patient's heart rate, blood pressure, and oxygen saturation were continuously monitored. The period of conscious sedation was 101 minutes, of which I was present face-to-face 100% of this time.  Coronary Findings   Diagnostic  Dominance: Right  Left Anterior Descending  Prox LAD to Mid LAD lesion 70% stenosed  The lesion is eccentric. The lesion is severely calcified.  Right Coronary Artery  Mid RCA lesion 90% stenosed  The lesion is concentric.  Intervention   Prox LAD to Mid LAD lesion  Angioplasty  Lesion crossed with guidewire using a WIRE COUGAR XT STRL 190CM. Angioplasty alone was performed using a BALLOON WOL[place] 2.50X10. Maximum pressure: 18 atm. The pre-interventional distal flow is normal (TIMI 3). The post-interventional distal flow is normal (TIMI 3). The intervention was unsuccessful due to inability to expand the balloon. No complications occurred at this lesion. Pressure wire/FFR was performed on the lesion using a MICROCATHETER NAVVUS. FFR measurement: 0.72. There is a moderate heavily calcified eccentric lesion in the proximal LAD. There is minimal progression from the previous study, but a significant stepdown is suggestive of a potentially hemodynamically significant lesion. An EBU 3.5 cm guide is used. A cougar wire is advanced across the lesion. A Navvus catheter is equalized at the end of the guide tip and then advanced across the lesion. The resting FFR is 0.88. The peak FFR with IV  adenosine is 0.72. A 2.5 x 10 mm Wolverine cutting balloon is advanced across the lesion and dilated to 12 atm (burst pressure). 2 inflations were done and there was a significant waist at the lesion site. A 2.5 x 12 mm noncompliant balloon was then advanced and dilated to 18 atm on 2 inflations with the second inflation causing the balloon to burst. The lesion was extremely rigid and I felt further attempts would risk serious dissection or even perforation. There is no angiographic evidence of dissection at the completion of the procedure. The balloon and wires are all removed.  Final angiography demonstrates no change at the lesion site and TIMI-3 flow in the vessel.  There is a 70% residual stenosis post intervention.  Mid RCA lesion  Angioplasty  Pre-stent angioplasty was performed. A STENT SYNERGY DES 4X16 drug eluting stent was successfully placed. Post-stent angioplasty was performed using a BALLOON [place] EUPHORA [id number]. The pre-interventional distal flow is normal (TIMI 3). The post-interventional distal flow is normal (TIMI 3). The intervention was successful . No complications occurred at this lesion. There is a tight lesion in the mid right coronary artery The previous cath study. A JR4 guide is used. A cougar wire is used to cross the lesion. Heparin is used for anticoagulation area the patient is preloaded with Plavix 600 mg on the table. A therapeutic ACT is achieved. The lesion is predilated with a 3.0 mm balloon. There is a localized balloon dissection present but it appears nonflow limiting. The lesion is then stented with a 4.0 mm x 16 mm Synergy DES deployed at 14 atm. The stent is postdilated with a 4.5 mm noncompliant balloon to 14 atm.  There is no residual stenosis post intervention.  Coronary Diagrams   Diagnostic Diagram       Post-Intervention Diagram        Echo 05-20-2016: Study Conclusions  - Left ventricle: The cavity size was normal. There was mild focal   basal  hypertrophy of the septum. Systolic function was normal.   Wall motion was normal; there were no regional wall motion   abnormalities. Left ventricular diastolic function parameters   were normal. - Aortic valve: Trileaflet. Moderate focal thickening and   calcification involving the noncoronary cusp. - Mitral valve: There is a thin very mobile bright target off the   MV that likely represents redundant chordae tendinae. Less likely   to be small ruptured chord with only trivial MR present. There   was trivial regurgitation. - Atrial septum: There was increased thickness of the septum,   consistent with lipomatous hypertrophy. - Pulmonic valve: There was trivial regurgitation.  ASSESSMENT AND PLAN: 1.  Coronary artery disease, native vessel, without angina: The patient appears clinically stable with no symptoms of angina, heart failure, or arrhythmia.  He continues on aspirin, clopidogrel, and a statin drug.  He has not been on a beta-blocker because of resting bradycardia.  He can proceed with left inguinal hernia repair at low risk of cardiovascular complications.  I advised him that he can hold Plavix anytime after December 1 which will take him out to 6 months from the time of his PCI procedure.  It would be fine for him to hold Plavix 5-7 days prior to left inguinal hernia surgery if this is okay with Dr. [place] Silos.  I would like him to continue aspirin 81 mg without interruption if possible.  Our service would be available if any problems arise.  2.  Hypertension: Blood pressure well controlled on lisinopril.  3.  Hyperlipidemia: Treated with simvastatin 20 mg daily.  Most recent lipids are reviewed with LDL cholesterol of 52, HDL cholesterol 81.  Current medicines are reviewed with the patient today.  The patient does not have concerns regarding medicines.  Labs/ tests ordered today include:   Orders Placed This Encounter  Procedures  . EKG 12-Lead    Disposition:   FU one  year  Signed, Sherren Mocha, MD  11/25/2016 10:23 AM    [facility] [place], [place], [place]  [address] Phone: [phone]; Fax: [phone]

## 2016-12-01 ENCOUNTER — Ambulatory Visit: Payer: Self-pay | Admitting: General Surgery

## 2016-12-06 MED FILL — LATANOPROST 0.005% EYE DRP: 0.005 | 25 days supply | Qty: 3 | Fill #0

## 2016-12-08 DIAGNOSIS — K4090 Unilateral inguinal hernia, without obstruction or gangrene, not specified as recurrent: Secondary | ICD-10-CM | POA: Diagnosis not present

## 2016-12-08 MED FILL — oxyCODONE HCL 5 MG TABS: 5 | 8 days supply | Qty: 30 | Fill #0

## 2016-12-13 MED FILL — BENZONATATE 200 MG CAPSULE: 200 | 5 days supply | Qty: 15 | Fill #0

## 2016-12-16 NOTE — Patient Instructions (Signed)
Jesus Harmon  12/16/2016   Your procedure is scheduled on: 12-22-16  Report to [facility] Main  Entrance Take [place]  elevators to 3rd floor to  [place] at 1115 AM.   Call this number if you have problems the morning of surgery [phone]   Remember: ONLY 1 PERSON MAY GO WITH YOU TO SHORT STAY TO GET  READY MORNING OF [place].  Do not eat food :After Midnight.CLEAR LIQUIDS FROM MIDNIGHT UNTIL 715 AM DAY OF SURGERY-NOTHING BY MOUTH AFTER 715 AM DAY OF SURGERY     CLEAR LIQUID DIET   Foods Allowed                                                                     Foods Excluded  Coffee and tea, regular and decaf                             liquids that you cannot  Plain Jell-O in any flavor                                             see through such as: Fruit ices (not with fruit pulp)                                     milk, soups, orange juice  Iced Popsicles                                    All solid food Carbonated beverages, regular and diet                                    Cranberry, grape and apple juices Sports drinks like Gatorade Lightly seasoned clear broth or consume(fat free) Sugar, honey syrup  Sample Menu Breakfast                                Lunch                                     Supper Cranberry juice                    Beef broth                            Chicken broth Jell-O                                     Grape juice  Apple juice Coffee or tea                        Jell-O                                      Popsicle                                                Coffee or tea                        Coffee or tea  _____________________________________________________________________              Stop plavix 5 days before surgery per dr cooper's instructions   Take these medicines the morning of surgery with A SIP OF WATER: OXYCODONE IF NEEDED              You  may not have any metal on your body including hair pins and              piercings  Do not wear jewelry, make-up, lotions, powders or perfumes, deodorant             Do not wear nail polish.  Do not shave  48 hours prior to surgery.              Men may shave face and neck.   Do not bring valuables to the hospital. [place].  Contacts, dentures or bridgework may not be worn into surgery.  Leave suitcase in the car. After surgery it may be brought to your room.                Please read over the following fact sheets you were given: _____________________________________________________________________             [facility] - Preparing for Surgery Before surgery, you can play an important role.  Because skin is not sterile, your skin needs to be as free of germs as possible.  You can reduce the number of germs on your skin by washing with CHG (chlorahexidine gluconate) soap before surgery.  CHG is an antiseptic cleaner which kills germs and bonds with the skin to continue killing germs even after washing. Please DO NOT use if you have an allergy to CHG or antibacterial soaps.  If your skin becomes reddened/irritated stop using the CHG and inform your nurse when you arrive at Short Stay. Do not shave (including legs and underarms) for at least 48 hours prior to the first CHG shower.  You may shave your face/neck. Please follow these instructions carefully:  1.  Shower with CHG Soap the night before surgery and the  morning of Surgery.  2.  If you choose to wash your hair, wash your hair first as usual with your  normal  shampoo.  3.  After you shampoo, rinse your hair and body thoroughly to remove the  shampoo.                           4.  Use CHG as you  would any other liquid soap.  You can apply chg directly  to the skin and wash                       Gently with a scrungie or clean washcloth.  5.  Apply the CHG Soap to your body ONLY  FROM THE NECK DOWN.   Do not use on face/ open                           Wound or open sores. Avoid contact with eyes, ears mouth and genitals (private parts).                       Wash face,  Genitals (private parts) with your normal soap.             6.  Wash thoroughly, paying special attention to the area where your surgery  will be performed.  7.  Thoroughly rinse your body with warm water from the neck down.  8.  DO NOT shower/wash with your normal soap after using and rinsing off  the CHG Soap.                9.  Pat yourself dry with a clean towel.            10.  Wear clean pajamas.            11.  Place clean sheets on your bed the night of your first shower and do not  sleep with pets. Day of Surgery : Do not apply any lotions/deodorants the morning of surgery.  Please wear clean clothes to the hospital/surgery center.  FAILURE TO FOLLOW THESE INSTRUCTIONS MAY RESULT IN THE CANCELLATION OF YOUR SURGERY PATIENT SIGNATURE_________________________________  NURSE SIGNATURE__________________________________  ________________________________________________________________________

## 2016-12-16 NOTE — Progress Notes (Signed)
CARDIAC CLEARANCE NOTE/LOV DR Burt Knack 11-25-16 Epic STRESS TEST 05-20-16 Epic  CARDIAC CATH 06-16-16 Epic ECHO 05-20-16 Epic EKG 11-25-16 Epic

## 2016-12-17 ENCOUNTER — Encounter (HOSPITAL_COMMUNITY)
Admission: RE | Admit: 2016-12-17 | Discharge: 2016-12-17 | Disposition: A | Discharge status: Home / Self Care | Payer: Medicare Other | Source: Ambulatory Visit | Attending: General Surgery | Admitting: General Surgery

## 2016-12-17 ENCOUNTER — Other Ambulatory Visit: Payer: Self-pay

## 2016-12-17 ENCOUNTER — Encounter (HOSPITAL_COMMUNITY): Payer: Self-pay

## 2016-12-17 DIAGNOSIS — K4090 Unilateral inguinal hernia, without obstruction or gangrene, not specified as recurrent: Secondary | ICD-10-CM | POA: Diagnosis not present

## 2016-12-17 DIAGNOSIS — Z01818 Encounter for other preprocedural examination: Secondary | ICD-10-CM | POA: Diagnosis not present

## 2016-12-17 LAB — CBC
HEMATOCRIT: 35 % — AB (ref 39.0–52.0)
HEMOGLOBIN: 11.1 g/dL — AB (ref 13.0–17.0)
MCH: 29.1 pg (ref 26.0–34.0)
MCHC: 31.7 g/dL (ref 30.0–36.0)
MCV: 91.6 fL (ref 78.0–100.0)
Platelets: 259 10*3/uL (ref 150–400)
RBC: 3.82 MIL/uL — AB (ref 4.22–5.81)
RDW: 13.6 % (ref 11.5–15.5)
WBC: 7.8 10*3/uL (ref 4.0–10.5)

## 2016-12-17 LAB — BASIC METABOLIC PANEL
Anion gap: 7 (ref 5–15)
BUN: 17 mg/dL (ref 6–20)
CHLORIDE: 104 mmol/L (ref 101–111)
CO2: 28 mmol/L (ref 22–32)
Calcium: 9.4 mg/dL (ref 8.9–10.3)
Creatinine, Ser: 0.99 mg/dL (ref 0.61–1.24)
GFR calc non Af Amer: >60 mL/min (ref >60)
Glucose, Bld: 105 mg/dL — ABNORMAL HIGH (ref 65–99)
POTASSIUM: 4.4 mmol/L (ref 3.5–5.1)
SODIUM: 139 mmol/L (ref 135–145)

## 2016-12-20 ENCOUNTER — Encounter (HOSPITAL_COMMUNITY): Admission: RE | Admit: 2016-12-20 | Payer: Medicare Other | Source: Ambulatory Visit

## 2016-12-22 ENCOUNTER — Ambulatory Visit (HOSPITAL_COMMUNITY): Payer: Medicare Other | Admitting: Certified Registered Nurse Anesthetist

## 2016-12-22 ENCOUNTER — Observation Stay (HOSPITAL_COMMUNITY)
Admission: RE | Admit: 2016-12-22 | Discharge: 2016-12-23 | Disposition: A | Discharge status: Home / Self Care | Payer: Medicare Other | Source: Ambulatory Visit | Attending: General Surgery | Admitting: General Surgery

## 2016-12-22 ENCOUNTER — Encounter (HOSPITAL_COMMUNITY)
Admission: RE | Disposition: A | Discharge status: Home / Self Care | Payer: Self-pay | Source: Ambulatory Visit | Attending: General Surgery

## 2016-12-22 ENCOUNTER — Other Ambulatory Visit: Payer: Self-pay

## 2016-12-22 ENCOUNTER — Encounter (HOSPITAL_COMMUNITY): Payer: Self-pay | Admitting: Emergency Medicine

## 2016-12-22 DIAGNOSIS — G8918 Other acute postprocedural pain: Secondary | ICD-10-CM | POA: Diagnosis not present

## 2016-12-22 DIAGNOSIS — K4090 Unilateral inguinal hernia, without obstruction or gangrene, not specified as recurrent: Secondary | ICD-10-CM | POA: Diagnosis not present

## 2016-12-22 DIAGNOSIS — Z7982 Long term (current) use of aspirin: Secondary | ICD-10-CM | POA: Diagnosis not present

## 2016-12-22 DIAGNOSIS — Z9889 Other specified postprocedural states: Secondary | ICD-10-CM

## 2016-12-22 DIAGNOSIS — Z8719 Personal history of other diseases of the digestive system: Secondary | ICD-10-CM

## 2016-12-22 DIAGNOSIS — Z880 Allergy status to penicillin: Secondary | ICD-10-CM | POA: Diagnosis not present

## 2016-12-22 DIAGNOSIS — I25709 Atherosclerosis of coronary artery bypass graft(s), unspecified, with unspecified angina pectoris: Secondary | ICD-10-CM | POA: Diagnosis not present

## 2016-12-22 DIAGNOSIS — Z87891 Personal history of nicotine dependence: Secondary | ICD-10-CM | POA: Diagnosis not present

## 2016-12-22 DIAGNOSIS — Z79899 Other long term (current) drug therapy: Secondary | ICD-10-CM | POA: Insufficient documentation

## 2016-12-22 DIAGNOSIS — I10 Essential (primary) hypertension: Secondary | ICD-10-CM | POA: Insufficient documentation

## 2016-12-22 HISTORY — PX: INSERTION OF MESH: SHX5868

## 2016-12-22 HISTORY — PX: INGUINAL HERNIA REPAIR: SHX194

## 2016-12-22 LAB — CREATININE, SERUM: Creatinine, Ser: 0.84 mg/dL (ref 0.61–1.24)

## 2016-12-22 LAB — CBC
HEMATOCRIT: 32.8 % — AB (ref 39.0–52.0)
Hemoglobin: 10.5 g/dL — ABNORMAL LOW (ref 13.0–17.0)
MCH: 29.3 pg (ref 26.0–34.0)
MCHC: 32 g/dL (ref 30.0–36.0)
MCV: 91.6 fL (ref 78.0–100.0)
PLATELETS: 292 10*3/uL (ref 150–400)
RBC: 3.58 MIL/uL — AB (ref 4.22–5.81)
RDW: 13.3 % (ref 11.5–15.5)
WBC: 14.1 10*3/uL — AB (ref 4.0–10.5)

## 2016-12-22 SURGERY — REPAIR, HERNIA, INGUINAL, ADULT
Anesthesia: General | Laterality: Left

## 2016-12-22 MED ORDER — FENTANYL CITRATE (PF) 100 MCG/2ML IJ SOLN
25.0000 ug | INTRAMUSCULAR | Status: DC | PRN
  Administered 2016-12-22 (×3): 50 ug via INTRAVENOUS

## 2016-12-22 MED ORDER — ASPIRIN EC 81 MG PO TBEC: 81.0000 mg | DELAYED_RELEASE_TABLET | Freq: Every evening | ORAL | Status: DC

## 2016-12-22 MED ORDER — FENTANYL CITRATE (PF) 100 MCG/2ML IJ SOLN: 25.0000 ug | INTRAMUSCULAR | Status: DC | PRN

## 2016-12-22 MED ORDER — ONDANSETRON HCL 4 MG/2ML IJ SOLN
INTRAMUSCULAR | Status: AC
  Filled 2016-12-22: qty 2

## 2016-12-22 MED ORDER — FENTANYL CITRATE (PF) 100 MCG/2ML IJ SOLN
INTRAMUSCULAR | Status: AC
  Filled 2016-12-22: qty 2

## 2016-12-22 MED ORDER — DIPHENHYDRAMINE HCL 50 MG/ML IJ SOLN: 12.5000 mg | Freq: Four times a day (QID) | INTRAMUSCULAR | Status: DC | PRN

## 2016-12-22 MED ORDER — MORPHINE SULFATE (PF) 4 MG/ML IV SOLN
INTRAVENOUS | Status: AC
  Filled 2016-12-22: qty 1

## 2016-12-22 MED ORDER — KETOROLAC TROMETHAMINE 30 MG/ML IJ SOLN: 30.0000 mg | Freq: Once | INTRAMUSCULAR | Status: DC | PRN

## 2016-12-22 MED ORDER — CHLORHEXIDINE GLUCONATE CLOTH 2 % EX PADS: 6.0000 | MEDICATED_PAD | Freq: Once | CUTANEOUS | Status: DC

## 2016-12-22 MED ORDER — MORPHINE SULFATE (PF) 4 MG/ML IV SOLN
2.0000 mg | INTRAVENOUS | Status: DC | PRN
  Administered 2016-12-22 (×2): 4 mg via INTRAVENOUS
  Filled 2016-12-22: qty 1

## 2016-12-22 MED ORDER — MEPERIDINE HCL 50 MG/ML IJ SOLN: 6.2500 mg | INTRAMUSCULAR | Status: DC | PRN

## 2016-12-22 MED ORDER — LISINOPRIL 10 MG PO TABS
10.0000 mg | ORAL_TABLET | Freq: Every day | ORAL | Status: DC
  Administered 2016-12-23: 10 mg via ORAL
  Filled 2016-12-22: qty 1

## 2016-12-22 MED ORDER — ENOXAPARIN SODIUM 40 MG/0.4ML ~~LOC~~ SOLN
40.0000 mg | SUBCUTANEOUS | Status: DC
  Administered 2016-12-23: 40 mg via SUBCUTANEOUS
  Filled 2016-12-22: qty 0.4

## 2016-12-22 MED ORDER — HYDROMORPHONE HCL 1 MG/ML IJ SOLN
INTRAMUSCULAR | Status: AC
  Filled 2016-12-22: qty 1

## 2016-12-22 MED ORDER — PANTOPRAZOLE SODIUM 40 MG IV SOLR
40.0000 mg | Freq: Every day | INTRAVENOUS | Status: DC
  Administered 2016-12-22: 40 mg via INTRAVENOUS
  Filled 2016-12-22: qty 40

## 2016-12-22 MED ORDER — ZOLPIDEM TARTRATE 5 MG PO TABS: 5.0000 mg | ORAL_TABLET | Freq: Every evening | ORAL | Status: DC | PRN

## 2016-12-22 MED ORDER — ONDANSETRON HCL 4 MG/2ML IJ SOLN: 4.0000 mg | Freq: Four times a day (QID) | INTRAMUSCULAR | Status: DC | PRN

## 2016-12-22 MED ORDER — NITROGLYCERIN 0.4 MG SL SUBL: 0.4000 mg | SUBLINGUAL_TABLET | SUBLINGUAL | Status: DC | PRN

## 2016-12-22 MED ORDER — OXYCODONE HCL 5 MG PO TABS: 5.0000 mg | ORAL_TABLET | ORAL | Status: DC | PRN

## 2016-12-22 MED ORDER — SIMVASTATIN 20 MG PO TABS
20.0000 mg | ORAL_TABLET | Freq: Every evening | ORAL | Status: DC
  Administered 2016-12-22: 20 mg via ORAL
  Filled 2016-12-22: qty 1

## 2016-12-22 MED ORDER — HYDROMORPHONE HCL 1 MG/ML IJ SOLN
0.2500 mg | INTRAMUSCULAR | Status: DC | PRN
  Administered 2016-12-22 (×2): 0.5 mg via INTRAVENOUS

## 2016-12-22 MED ORDER — BUPIVACAINE-EPINEPHRINE 0.25% -1:200000 IJ SOLN
INTRAMUSCULAR | Status: DC | PRN
  Administered 2016-12-22: 10 mL

## 2016-12-22 MED ORDER — BUPIVACAINE-EPINEPHRINE (PF) 0.25% -1:200000 IJ SOLN
INTRAMUSCULAR | Status: AC
  Filled 2016-12-22: qty 30

## 2016-12-22 MED ORDER — LACTATED RINGERS IV SOLN
INTRAVENOUS | Status: DC
  Administered 2016-12-22 (×3): via INTRAVENOUS

## 2016-12-22 MED ORDER — ONDANSETRON 4 MG PO TBDP: 4.0000 mg | ORAL_TABLET | Freq: Four times a day (QID) | ORAL | Status: DC | PRN

## 2016-12-22 MED ORDER — LATANOPROST 0.005 % OP SOLN
1.0000 [drp] | Freq: Every day | OPHTHALMIC | Status: DC
  Administered 2016-12-22: 1 [drp] via OPHTHALMIC
  Filled 2016-12-22: qty 2.5

## 2016-12-22 MED ORDER — PROMETHAZINE HCL 25 MG/ML IJ SOLN: 6.2500 mg | INTRAMUSCULAR | Status: DC | PRN

## 2016-12-22 MED ORDER — FENTANYL CITRATE (PF) 100 MCG/2ML IJ SOLN
INTRAMUSCULAR | Status: AC
  Filled 2016-12-22: qty 4

## 2016-12-22 MED ORDER — DIPHENHYDRAMINE HCL 12.5 MG/5ML PO ELIX: 12.5000 mg | ORAL_SOLUTION | Freq: Four times a day (QID) | ORAL | Status: DC | PRN

## 2016-12-22 MED ORDER — PROPOFOL 10 MG/ML IV BOLUS
INTRAVENOUS | Status: DC | PRN
  Administered 2016-12-22: 140 mg via INTRAVENOUS

## 2016-12-22 MED ORDER — FENTANYL CITRATE (PF) 100 MCG/2ML IJ SOLN
INTRAMUSCULAR | Status: AC
  Administered 2016-12-22: 100 ug
  Filled 2016-12-22: qty 2

## 2016-12-22 MED ORDER — PROMETHAZINE-CODEINE 6.25-10 MG/5ML PO SYRP: 5.0000 mL | ORAL_SOLUTION | ORAL | Status: DC | PRN

## 2016-12-22 MED ORDER — PROPOFOL 10 MG/ML IV BOLUS
INTRAVENOUS | Status: AC
  Filled 2016-12-22: qty 20

## 2016-12-22 MED ORDER — SUGAMMADEX SODIUM 200 MG/2ML IV SOLN
INTRAVENOUS | Status: AC
  Filled 2016-12-22: qty 2

## 2016-12-22 MED ORDER — GUAIFENESIN ER 600 MG PO TB12
600.0000 mg | ORAL_TABLET | Freq: Two times a day (BID) | ORAL | Status: DC
  Administered 2016-12-22 – 2016-12-23 (×2): 600 mg via ORAL
  Filled 2016-12-22 (×2): qty 1

## 2016-12-22 MED ORDER — CIPROFLOXACIN IN D5W 400 MG/200ML IV SOLN
400.0000 mg | INTRAVENOUS | Status: AC
  Administered 2016-12-22: 400 mg via INTRAVENOUS
  Filled 2016-12-22: qty 200

## 2016-12-22 MED ORDER — ONDANSETRON HCL 4 MG/2ML IJ SOLN
INTRAMUSCULAR | Status: DC | PRN
  Administered 2016-12-22: 4 mg via INTRAVENOUS

## 2016-12-22 MED ORDER — MIDAZOLAM HCL 2 MG/2ML IJ SOLN
INTRAMUSCULAR | Status: AC
  Administered 2016-12-22: 2 mg
  Filled 2016-12-22: qty 2

## 2016-12-22 MED ORDER — TRAMADOL HCL 50 MG PO TABS
50.0000 mg | ORAL_TABLET | Freq: Four times a day (QID) | ORAL | Status: DC | PRN
  Administered 2016-12-22 – 2016-12-23 (×2): 50 mg via ORAL
  Filled 2016-12-22 (×3): qty 1

## 2016-12-22 SURGICAL SUPPLY — 37 items
ADH SKN CLS APL DERMABOND .7 (GAUZE/BANDAGES/DRESSINGS) ×1
APL SKNCLS STERI-STRIP NONHPOA (GAUZE/BANDAGES/DRESSINGS) ×1
BENZOIN TINCTURE PRP APPL 2/3 (GAUZE/BANDAGES/DRESSINGS) ×2 IMPLANT
BLADE HEX COATED 2.75 (ELECTRODE) ×2 IMPLANT
BLADE SURG SZ10 CARB STEEL (BLADE) ×4 IMPLANT
DECANTER SPIKE VIAL GLASS SM (MISCELLANEOUS) ×2 IMPLANT
DERMABOND ADVANCED (GAUZE/BANDAGES/DRESSINGS) ×1
DERMABOND ADVANCED .7 DNX12 (GAUZE/BANDAGES/DRESSINGS) IMPLANT
DRAIN PENROSE 18X1/2 LTX STRL (DRAIN) ×2 IMPLANT
DRAPE LAPAROTOMY TRNSV 102X78 (DRAPE) ×2 IMPLANT
ELECT PENCIL ROCKER SW 15FT (MISCELLANEOUS) ×2 IMPLANT
ELECT REM PT RETURN 15FT ADLT (MISCELLANEOUS) ×2 IMPLANT
GAUZE SPONGE 4X4 12PLY STRL (GAUZE/BANDAGES/DRESSINGS) ×2 IMPLANT
GLOVE BIOGEL PI IND STRL 7.0 (GLOVE) ×1 IMPLANT
GLOVE BIOGEL PI INDICATOR 7.0 (GLOVE) ×1
GLOVE ECLIPSE 8.0 STRL XLNG CF (GLOVE) ×2 IMPLANT
GOWN STRL REUS W/TWL LRG LVL3 (GOWN DISPOSABLE) ×2 IMPLANT
GOWN STRL REUS W/TWL XL LVL3 (GOWN DISPOSABLE) ×4 IMPLANT
KIT BASIN OR (CUSTOM PROCEDURE TRAY) ×2 IMPLANT
MESH HERNIA 3X6 (Mesh General) ×1 IMPLANT
NDL HYPO 25X1 1.5 SAFETY (NEEDLE) ×1 IMPLANT
NEEDLE HYPO 25X1 1.5 SAFETY (NEEDLE) ×2 IMPLANT
NS IRRIG 1000ML POUR BTL (IV SOLUTION) ×2 IMPLANT
PACK BASIC VI WITH GOWN DISP (CUSTOM PROCEDURE TRAY) ×2 IMPLANT
SPONGE LAP 4X18 X RAY DECT (DISPOSABLE) ×4 IMPLANT
STRIP CLOSURE SKIN 1/2X4 (GAUZE/BANDAGES/DRESSINGS) ×2 IMPLANT
SUT MNCRL AB 4-0 PS2 18 (SUTURE) ×2 IMPLANT
SUT PROLENE 0 SH 30 (SUTURE) ×6 IMPLANT
SUT VIC AB 2-0 SH 27 (SUTURE) ×4
SUT VIC AB 2-0 SH 27X BRD (SUTURE) ×2 IMPLANT
SUT VIC AB 3-0 SH 27 (SUTURE) ×4
SUT VIC AB 3-0 SH 27X BRD (SUTURE) ×1 IMPLANT
SUT VIC AB 3-0 SH 27XBRD (SUTURE) IMPLANT
SYR BULB IRRIGATION 50ML (SYRINGE) ×2 IMPLANT
SYR CONTROL 10ML LL (SYRINGE) ×2 IMPLANT
TOWEL OR 17X26 10 PK STRL BLUE (TOWEL DISPOSABLE) ×2 IMPLANT
YANKAUER SUCT BULB TIP 10FT TU (MISCELLANEOUS) ×2 IMPLANT

## 2016-12-22 NOTE — Progress Notes (Addendum)
AssistedDrs. Moser/Hatchett  with left, ultrasound guided, adductor canal block. Side rails up, monitors on throughout procedure. See vital signs in flow sheet. Tolerated Procedure well.

## 2016-12-22 NOTE — Anesthesia Postprocedure Evaluation (Signed)
Anesthesia Post Note  Patient: Jesus Harmon  Procedure(s) Performed: REPAIR OF LEFT INGUINAL HERNIA WITH MESH (Left ) INSERTION OF MESH (Left )     Patient location during evaluation: PACU Anesthesia Type: General Level of consciousness: awake and sedated Pain management: pain level not controlled Vital Signs Assessment: post-procedure vital signs reviewed and stable Respiratory status: spontaneous breathing Cardiovascular status: stable Postop Assessment: no apparent nausea or vomiting Anesthetic complications: no    Last Vitals:  Vitals:   12/22/16 1615 12/22/16 1630  BP: (!) 144/74 (!) 155/65  Pulse: (!) 57 (!) 55  Resp: (!) 24 17  Temp:    SpO2: 99% 100%    Last Pain:  Vitals:   12/22/16 1630  TempSrc:   PainSc: 6    Pain Goal: Patients Stated Pain Goal: 4 (12/22/16 1127)               [place]

## 2016-12-22 NOTE — Transfer of Care (Signed)
Immediate Anesthesia Transfer of Care Note  Patient: Jesus Harmon  Procedure(s) Performed: REPAIR OF LEFT INGUINAL HERNIA WITH MESH (Left ) INSERTION OF MESH (Left )  Patient Location: PACU  Anesthesia Type:General  Level of Consciousness: awake, alert  and oriented  Airway & Oxygen Therapy: Patient Spontanous Breathing and Patient connected to face mask oxygen  Post-op Assessment: Report given to RN and Post -op Vital signs reviewed and stable  Post vital signs: Reviewed and stable  Last Vitals:  Vitals:   12/22/16 1419 12/22/16 1420  BP:  (!) 118/57  Pulse: 64   Resp: (!) 21   Temp:    SpO2: 99%     Last Pain:  Vitals:   12/22/16 1105  TempSrc: Oral      Patients Stated Pain Goal: 4 (98/33/82 5053)  Complications: No apparent anesthesia complications

## 2016-12-22 NOTE — Anesthesia Procedure Notes (Addendum)
Anesthesia Regional Block: TAP block   Pre-Anesthetic Checklist: ,, timeout performed, Correct Patient, Correct Site, Correct Laterality, Correct Procedure, Correct Position, site marked, Risks and benefits discussed,  Surgical consent,  Pre-op evaluation,  At surgeon's request and post-op pain management  Laterality: Left and Lower  Prep: chloraprep       Needles:  Injection technique: Single-shot     Needle Length: 9cm  Needle Gauge: 21     Additional Needles:   Procedures:,,,, ultrasound used (permanent image in chart),,,,  Narrative:  Start time: 12/22/2016 1:48 PM End time: 12/22/2016 2:18 PM Injection made incrementally with aspirations every 5 mL.  Performed by: Personally  Anesthesiologist: Lyn Hollingshead, MD

## 2016-12-22 NOTE — H&P (Signed)
Jesus Harmon 11/17/2016 9:13 AM Location: [facility] Patient #: [id number] DOB: 07-Dec-1929 Married / Language: Jesus Harmon / Race: White Male   History of Present Illness [name] MD; 11/17/2016 9:31 AM) The patient is a 83 year old male who presents with an inguinal hernia. I was asked to see [name] in consultation by Dr. Donnie Coffin regarding a left inguinal hernia. Tunnel developed some left inguinal pain and was found on exam by Dr. Alroy Dust have a left inguinal hernia. He can feel a lump there usually during the day but it goes in and out. If he lays down at night it pops back in pain usually comes out in the morning after he gets up. He complains of localized pain but has not had any changes in bowel or bladder habits. Of note, he is status post PTCA with drug-eluting stent by Dr. Burt Knack in June 2018 and he is on dual antiplatelet therapy with aspirin and Plavix. Additionally, he has a significant stenosis in his LAD. Further intervention was initially planned but at this time, Steed Kanaan reports they're holding off for now. He sees Dr. Burt Knack regularly.   Past Surgical History (Jesus Harmon, [place]; 11/17/2016 9:13 AM) Cataract Surgery  Bilateral. Knee Surgery  Left. Tonsillectomy  TURP   Diagnostic Studies History (Jesus Harmon, [place]; 11/17/2016 9:13 AM) Colonoscopy  >10 years ago  Allergies (Jesus Harmon, Jesus Harmon; 11/17/2016 9:15 AM) Penicillins  Sulfa Antibiotics  Allergies Reconciled   Medication History (Jesus Harmon, Jesus Harmon; 11/17/2016 9:16 AM) Lisinopril (10MG  Tablet, Oral) Active. Simvastatin (20MG  Tablet, Oral) Active. Clopidogrel Bisulfate (75MG  Tablet, Oral) Active. Nitroglycerin (0.4MG  Tab Sublingual, Sublingual) Active. Aspirin (81MG  Tablet, Oral) Active. Medications Reconciled  Social History (Jesus Harmon, [place]; 11/17/2016 9:13 AM) Caffeine use  Carbonated beverages, Coffee, Tea. No alcohol use  No drug  use  Tobacco use  Former smoker.  Family History (Jesus Harmon, [place]; 11/17/2016 9:13 AM) Arthritis  Mother. Diabetes Mellitus  Father. Heart Disease  Mother. Migraine Headache  Daughter. Respiratory Condition  Father. Thyroid problems  Daughter.  Other Problems (Jesus A. Jesus Harmon, Jesus Harmon; 11/17/2016 9:13 AM) Arthritis  Back Pain  Cancer  Enlarged Prostate  Inguinal Hernia  Kidney Stone  Melanoma  Migraine Headache  Other disease, cancer, significant illness     Review of Systems (Jesus A. Jesus Harmon RMA; 11/17/2016 9:13 AM) General Present- Weight Loss. Not Present- Appetite Loss, Chills, Fatigue, Fever, Night Sweats and Weight Gain. Skin Not Present- Change in Wart/Mole, Dryness, Hives, Jaundice, New Lesions, Non-Healing Wounds, Rash and Ulcer. HEENT Present- Seasonal Allergies and Wears glasses/contact lenses. Not Present- Earache, Hearing Loss, Hoarseness, Nose Bleed, Oral Ulcers, Ringing in the Ears, Sinus Pain, Sore Throat, Visual Disturbances and Yellow Eyes. Respiratory Not Present- Bloody sputum, Chronic Cough, Difficulty Breathing, Snoring and Wheezing. Breast Not Present- Breast Mass, Breast Pain, Nipple Discharge and Skin Changes. Cardiovascular Not Present- Chest Pain, Difficulty Breathing Lying Down, Leg Cramps, Palpitations, Rapid Heart Rate, Shortness of Breath and Swelling of Extremities. Gastrointestinal Present- Constipation and Excessive gas. Not Present- Abdominal Pain, Bloating, Bloody Stool, Change in Bowel Habits, Chronic diarrhea, Difficulty Swallowing, Gets full quickly at meals, Hemorrhoids, Indigestion, Nausea, Rectal Pain and Vomiting. Male Genitourinary Present- Frequency and Nocturia. Not Present- Blood in Urine, Change in Urinary Stream, Impotence, Painful Urination, Urgency and Urine Leakage. Musculoskeletal Not Present- Back Pain, Joint Pain, Joint Stiffness, Muscle Pain, Muscle Weakness and Swelling of Extremities. Neurological Not  Present- Decreased Memory, Fainting, Headaches, Numbness, Seizures, Tingling,  Tremor, Trouble walking and Weakness. Psychiatric Not Present- Anxiety, Bipolar, Change in Sleep Pattern, Depression, Fearful and Frequent crying. Endocrine Not Present- Cold Intolerance, Excessive Hunger, Hair Changes, Heat Intolerance, Hot flashes and New Diabetes. Hematology Present- Blood Thinners, Easy Bruising and Excessive bleeding. Not Present- Gland problems, HIV and Persistent Infections.  Vitals (Jesus A. Jesus Harmon RMA; 11/17/2016 9:15 AM) 11/17/2016 9:14 AM Weight: 152.2 lb Height: 70in Body Surface Area: 1.86 m Body Mass Index: 21.84 kg/m  Temp.: 98.59F  Pulse: 78 (Regular)  BP: 124/82 (Sitting, Left Arm, Standard)       Physical Exam (Roschelle Calandra E. Grandville Silos MD; 11/17/2016 9:32 AM) General Mental Status-Alert. General Appearance-Consistent with stated age. Hydration-Well hydrated. Voice-Normal.  Integumentary Note: Small contusions on his hands left greater than right   Head and Neck Head-normocephalic, atraumatic with no lesions or palpable masses. Trachea-midline. Thyroid Gland Characteristics - normal size and consistency.  Eye Eyeball - Bilateral-Extraocular movements intact. Sclera/Conjunctiva - Bilateral-No scleral icterus.  Chest and Lung Exam Chest and lung exam reveals -quiet, even and easy respiratory effort with no use of accessory muscles and on auscultation, normal breath sounds, no adventitious sounds and normal vocal resonance. Inspection Chest Wall - Normal. Back - normal.  Cardiovascular Cardiovascular examination reveals -normal heart sounds, regular rate and rhythm with no murmurs and normal pedal pulses bilaterally.  Abdomen Inspection Inspection of the abdomen reveals - No Hernias. Palpation/Percussion Palpation and Percussion of the abdomen reveal - Soft, Non Tender, No Rebound tenderness, No Rigidity (guarding) and No  hepatosplenomegaly. Auscultation Auscultation of the abdomen reveals - Bowel sounds normal.  Male Genitourinary Note: Penis is normal, both testes are descended without any edema or tenderness, no evidence of right inguinal hernia, he has a moderate sized left inguinal hernia which is easily reducible but automatically occurs, it does not extend down into his scrotum, mild discomfort on reduction.   Neurologic Neurologic evaluation reveals -alert and oriented x 3 with no impairment of recent or remote memory. Mental Status-Normal.  Musculoskeletal Global Assessment -Note: no gross deformities.  Normal Exam - Left-Upper Extremity Strength Normal and Lower Extremity Strength Normal. Normal Exam - Right-Upper Extremity Strength Normal and Lower Extremity Strength Normal.  Lymphatic Head & Neck  General Head & Neck Lymphatics: Bilateral - Description - Normal. Axillary  General Axillary Region: Bilateral - Description - Normal. Tenderness - Non Tender. Femoral & Inguinal  Generalized Femoral & Inguinal Lymphatics: Bilateral - Description - No Generalized lymphadenopathy.    Assessment & Plan [name] MD; 11/17/2016 9:35 AM) INGUINAL HERNIA OF LEFT SIDE WITHOUT OBSTRUCTION OR GANGRENE (K40.90) Impression: I discussed left inguinal hernia repair with mesh with him. I discussed the procedure, risks, and benefits. I discussed the expected postoperative course and the chance of improving his symptoms. Additionally, I discussed his significant coronary artery disease and the need for cardiac clearance from Dr. Burt Knack. He will also need to be able to be off his Plavix for 5 days before surgery area I am willing to allow him to continue his aspirin. I understand it may be some time before Dr. Burt Knack feels it safe for him to hold his Plavix in light of his drug-eluting stent placed in June. Fortunately, while his hernia is symptomatic, it is not an emergency to repair  immediately. I will await word from Dr. Burt Knack and proceed accordingly.  Update - cardiac clearance received from Dr. Burt Knack.  12/22/16 re-eval: exam unchanged. Mild URI symptoms. No other changes.  Georganna Skeans, MD, MPH, FACS Trauma: [phone]  General Surgery: [phone]

## 2016-12-22 NOTE — Op Note (Signed)
12/22/2016  3:31 PM  PATIENT:  Jesus Harmon  83 y.o. male  PRE-OPERATIVE DIAGNOSIS:  left inguinal hernia  POST-OPERATIVE DIAGNOSIS:  left inguinal hernia  PROCEDURE:  Procedure(s): REPAIR OF LEFT INGUINAL HERNIA WITH MESH INSERTION OF MESH  SURGEON:  Surgeon(s): Georganna Skeans, MD  ASSISTANTS: none   ANESTHESIA:   local, regional and general  EBL:  No intake/output data recorded.  BLOOD ADMINISTERED:none  DRAINS: none   SPECIMEN:  No Specimen  DISPOSITION OF SPECIMEN:  N/A  COUNTS:  YES  DICTATION: .Dragon Dictation Findings: Indirect hernia, cord lipoma  Procedure in detail: [name] presents for left inguinal hernia repair with mesh.  He was identified in the preop holding area.  Informed consent was obtained.  He received IV antibiotics.  A block was placed by anesthesia.  He was brought to the operating room and his abdomen and groins were prepped and draped in a sterile fashion.  We did a timeout procedure.  Local anesthetic was injected in the left groin along the planned line of incision.  Left groin incision was made.  Subtenons tissues were dissected down through Scarpa's fascia revealing the external oblique fascia.  It was attenuated medially due to the hernia.  It was opened laterally and the opening was continued down to the external ring.  The superior leaflet was dissected free off the transversalis.  The inferior leaflet was dissected down revealing the shelving edge of the inguinal ligament.  Cord structures were encircled with a Penrose drain.  The cord was dissected and a large cord lipoma was excised.  Next, the hernia sac was identified which was an indirect hernia sac.  This was opened and dissected free from the cord structures.  The contents had spontaneously reduced.  It was high ligated with 2-0 Vicryl.  The floor was intact.  The hernia was then repaired with a keyhole polypropylene mesh cut to custom size and shape.  This was tacked to the  tissues over the pubic tubercle with 0 Prolene.  It was secured with a running 0 Prolene to the shelving edge of the inguinal ligament inferiorly.  Superiorly interrupted 0 Prolenes were used to tack it down to the transversalis.  The 2 leaflets were rejoined behind the cord and tucked down laterally under the external oblique.  These were then tacked down with interrupted 0 Prolene.  The wound was copiously irrigated.  Hemostasis was ensured.  The external oblique was closed with running 2-0 Vicryl.  Hemostasis was ensured.  Scarpa's fascia was closed with interrupted 3-0 Vicryl and the skin was closed with running 4-0 Monocryl followed by Dermabond.  All counts were correct.  I relocated his left testicle to anatomic position in the scrotum.  There were no apparent comp occasions and he was taken to recovery in stable condition. PATIENT DISPOSITION:  PACU - hemodynamically stable.   Delay start of Pharmacological VTE agent (>24hrs) due to surgical blood loss or risk of bleeding:  no  Georganna Skeans, MD, MPH, FACS Pager: [phone]  12/12/20183:31 PM

## 2016-12-22 NOTE — Interval H&P Note (Signed)
History and Physical Interval Note:  12/22/2016 12:53 PM  Jesus Harmon  has presented today for surgery, with the diagnosis of left inguinal hernia  The various methods of treatment have been discussed with the patient and family. After consideration of risks, benefits and other options for treatment, the patient has consented to  Procedure(s): REPAIR OF LEFT INGUINAL HERNIA WITH MESH (Left) INSERTION OF MESH (Left) as a surgical intervention .  The patient's history has been reviewed, patient examined, no change in status, stable for surgery.  I have reviewed the patient's chart and labs.  Questions were answered to the patient's satisfaction.     Zenovia Jarred

## 2016-12-22 NOTE — Anesthesia Procedure Notes (Signed)
Procedure Name: LMA Insertion Date/Time: 12/22/2016 2:32 PM Performed by: [place] Cashawn Yanko C, CRNA Pre-anesthesia Checklist: Patient identified, Emergency Drugs available, Suction available and Patient being monitored Patient Re-evaluated:Patient Re-evaluated prior to induction Oxygen Delivery Method: Circle system utilized Preoxygenation: Pre-oxygenation with 100% oxygen Induction Type: IV induction Ventilation: Mask ventilation without difficulty LMA: LMA inserted LMA Size: 4.0 Number of attempts: 1 Airway Equipment and Method: Bite block Placement Confirmation: positive ETCO2 Tube secured with: Tape Dental Injury: Teeth and Oropharynx as per pre-operative assessment

## 2016-12-22 NOTE — Anesthesia Preprocedure Evaluation (Signed)
Anesthesia Evaluation  Patient identified by MRN, date of birth, ID band Patient awake    Reviewed: Allergy & Precautions, NPO status , Patient's Chart, lab work & pertinent test results  Airway Mallampati: I       Dental no notable dental hx. (+) Teeth Intact   Pulmonary former smoker,    Pulmonary exam normal breath sounds clear to auscultation       Cardiovascular hypertension, Normal cardiovascular exam Rhythm:Regular Rate:Normal     Neuro/Psych negative psych ROS   GI/Hepatic negative GI ROS, Neg liver ROS,   Endo/Other  negative endocrine ROS  Renal/GU negative Renal ROS  negative genitourinary   Musculoskeletal   Abdominal Normal abdominal exam  (+)   Peds  Hematology negative hematology ROS (+)   Anesthesia Other Findings   Reproductive/Obstetrics                             Anesthesia Physical Anesthesia Plan  ASA: III  Anesthesia Plan: General   Post-op Pain Management:  Regional for Post-op pain   Induction:   PONV Risk Score and Plan: Ondansetron, Dexamethasone and Treatment may vary due to age or medical condition  Airway Management Planned: LMA  Additional Equipment:   Intra-op Plan:   Post-operative Plan:   Informed Consent: I have reviewed the patients History and Physical, chart, labs and discussed the procedure including the risks, benefits and alternatives for the proposed anesthesia with the patient or authorized representative who has indicated his/her understanding and acceptance.   Dental advisory given  Plan Discussed with: CRNA and Surgeon  Anesthesia Plan Comments:         Anesthesia Quick Evaluation

## 2016-12-23 ENCOUNTER — Encounter (HOSPITAL_COMMUNITY): Payer: Self-pay | Admitting: General Surgery

## 2016-12-23 ENCOUNTER — Ambulatory Visit: Payer: Medicare Other | Admitting: Cardiovascular Disease

## 2016-12-23 DIAGNOSIS — Z7982 Long term (current) use of aspirin: Secondary | ICD-10-CM | POA: Diagnosis not present

## 2016-12-23 DIAGNOSIS — I10 Essential (primary) hypertension: Secondary | ICD-10-CM | POA: Diagnosis not present

## 2016-12-23 DIAGNOSIS — K4090 Unilateral inguinal hernia, without obstruction or gangrene, not specified as recurrent: Secondary | ICD-10-CM | POA: Diagnosis not present

## 2016-12-23 DIAGNOSIS — Z87891 Personal history of nicotine dependence: Secondary | ICD-10-CM | POA: Diagnosis not present

## 2016-12-23 DIAGNOSIS — Z79899 Other long term (current) drug therapy: Secondary | ICD-10-CM | POA: Diagnosis not present

## 2016-12-23 DIAGNOSIS — Z880 Allergy status to penicillin: Secondary | ICD-10-CM | POA: Diagnosis not present

## 2016-12-23 MED ORDER — OXYCODONE HCL 5 MG PO TABS: 5.0000 mg | ORAL_TABLET | ORAL | 0 refills | Status: DC | PRN

## 2016-12-23 MED FILL — oxyCODONE HCL 5 MG TABS: 5 | 3 days supply | Qty: 30 | Fill #0

## 2016-12-23 NOTE — Care Management Obs Status (Signed)
[place] NOTIFICATION   Patient Details  Name: CHUN SELLEN MRN: [id number] Date of Birth: 1929-07-30   Medicare Observation Status Notification Given:  Yes    Guadalupe Maple, RN 12/23/2016, 11:19 AM

## 2016-12-23 NOTE — Discharge Summary (Signed)
Physician Discharge Summary  Patient ID: Jesus Harmon MRN: [id number] DOB/AGE: January 20, 1929 83 y.o.  Admit date: 12/22/2016 Discharge date: 12/23/2016  Admission Diagnoses:left inguinal hernia  Discharge Diagnoses:  Active Problems:   S/P inguinal hernia repair   Discharged Condition: good  Hospital Course: Patient did well post-op. No cardiac issues. Good pain control. D/C home POD#1.  Consults: None  Significant Diagnostic Studies: no  Treatments: surgery: above  Discharge Exam: Blood pressure (!) 117/53, pulse 73, temperature 98.9 F (37.2 C), temperature source Oral, resp. rate 12, height 6' (1.829 m), weight 71.2 kg (157 lb), SpO2 91 %. General appearance: alert and cooperative Resp: clear to auscultation bilaterally Male genitalia: L inguinal incision CDI  Disposition: 01-Home or Self Care  Discharge Instructions    Call MD for:  redness, tenderness, or signs of infection (pain, swelling, redness, odor or green/yellow discharge around incision site)   Complete by:  As directed    Diet - low sodium heart healthy   Complete by:  As directed    Discharge instructions   Complete by:  As directed    CC[facility] _______[facility]  UMBILICAL OR INGUINAL HERNIA REPAIR: POST OP INSTRUCTIONS  Always review your discharge instruction sheet given to you by the facility where your surgery was performed. IF YOU HAVE DISABILITY OR FAMILY LEAVE FORMS, YOU MUST BRING THEM TO THE OFFICE FOR PROCESSING.   DO NOT GIVE THEM TO YOUR DOCTOR.  1. A  prescription for pain medication may be given to you upon discharge.  Take your pain medication as prescribed, if needed.  If narcotic pain medicine is not needed, then you may take acetaminophen (Tylenol) or ibuprofen (Advil) as needed. 2. Take your usually prescribed medications unless otherwise directed. If you need a refill on your pain medication, please contact your pharmacy.  They will contact our office to request  authorization. Prescriptions will not be filled after 5 pm or on week-ends. 3. You should follow a light diet the first 24 hours after arrival home, such as soup and crackers, etc.  Be sure to include lots of fluids daily.  Resume your normal diet the day after surgery. 4.Most patients will experience some swelling and bruising around the umbilicus or in the groin and scrotum.  Ice packs and reclining will help.  Swelling and bruising can take several days to resolve.  6. It is common to experience some constipation if taking pain medication after surgery.  Increasing fluid intake and taking a stool softener (such as Colace) will usually help or prevent this problem from occurring.  A mild laxative (Milk of Magnesia or Miralax) should be taken according to package directions if there are no bowel movements after 48 hours. 7. Unless discharge instructions indicate otherwise, you may remove your bandages 24-48 hours after surgery, and you may shower at that time.  You may have steri-strips (small skin tapes) in place directly over the incision.  These strips should be left on the skin for 7-10 days.  If your surgeon used skin glue on the incision, you may shower in 24 hours.  The glue will flake off over the next 2-3 weeks.  Any sutures or staples will be removed at the office during your follow-up visit. 8. ACTIVITIES:  You may resume regular (light) daily activities beginning the next day-such as daily self-care, walking, climbing stairs-gradually increasing activities as tolerated.  You may have sexual intercourse when it is comfortable.  Refrain from any heavy lifting or straining until  approved by your doctor.  a.You may drive when you are no longer taking prescription pain medication, you can comfortably wear a seatbelt, and you can safely maneuver your car and apply brakes. b.RETURN TO WORK:   _____________________________________________  9.You should see your doctor in the office for a follow-up  appointment approximately 2-3 weeks after your surgery.  Make sure that you call for this appointment within a day or two after you arrive home to insure a convenient appointment time. 10.OTHER INSTRUCTIONS: _________________________    _____________________________________  WHEN TO CALL YOUR DOCTOR: Fever over 101.0 Inability to urinate Nausea and/or vomiting Extreme swelling or bruising Continued bleeding from incision. Increased pain, redness, or drainage from the incision  The clinic staff is available to answer your questions during regular business hours.  Please don't hesitate to call and ask to speak to one of the nurses for clinical concerns.  If you have a medical emergency, go to the nearest emergency room or call 911.  A surgeon from [facility] is always on call at the hospital   [address] [place], [place], [place]  [address] ?  [address], [place], [place]   [address] [phone] ? [phone] ? FAX [phone] Web site: [email or web address]   Increase activity slowly   Complete by:  As directed    Lifting restrictions   Complete by:  As directed    Do not lift over 10lbs for 6 weeks     Allergies as of 12/23/2016      Reactions   Amoxicillin Swelling, Rash, Other (See Comments)   Swelling and rash to face Has patient had a PCN reaction causing immediate rash, facial/tongue/throat swelling, SOB or lightheadedness with hypotension: Yes Has patient had a PCN reaction causing severe rash involving mucus membranes or skin necrosis: No Has patient had a PCN reaction that required hospitalization: No Has patient had a PCN reaction occurring within the last 10 years: Yes If all of the above answers are "NO", then may proceed with Cephalosporin use.   Sulfonamide Derivatives Hives   Sulfamethoxazole Hives      Medication List    TAKE these medications   acetaminophen 500 MG chewable tablet Commonly known as:  TYLENOL Chew  1,000 mg by mouth every 6 (six) hours as needed for pain.   aspirin EC 81 MG tablet Take 81 mg by mouth every evening.   clopidogrel 75 MG tablet Commonly known as:  PLAVIX Take 1 tablet (75 mg total) by mouth daily with breakfast.   fexofenadine 180 MG tablet Commonly known as:  ALLEGRA Take 180 mg by mouth daily as needed for allergies.   guaiFENesin 600 MG 12 hr tablet Commonly known as:  MUCINEX Take by mouth 2 (two) times daily.   latanoprost 0.005 % ophthalmic solution Commonly known as:  XALATAN Place 1 drop into both eyes at bedtime.   lisinopril 10 MG tablet Commonly known as:  PRINIVIL,ZESTRIL Take 1 tablet (10 mg total) by mouth daily.   nitroGLYCERIN 0.4 MG SL tablet Commonly known as:  NITROSTAT Place 1 tablet (0.4 mg total) under the tongue every 5 (five) minutes as needed for chest pain.   oxyCODONE 5 MG immediate release tablet Commonly known as:  Oxy IR/ROXICODONE Take 5 mg by mouth every 6 (six) hours as needed for moderate pain. What changed:  Another medication with the same name was added. Make sure you understand how and when to take each.   oxyCODONE 5 MG immediate  release tablet Commonly known as:  Oxy IR/ROXICODONE Take 1-2 tablets (5-10 mg total) by mouth every 4 (four) hours as needed for moderate pain. What changed:  You were already taking a medication with the same name, and this prescription was added. Make sure you understand how and when to take each.   promethazine-codeine 6.25-10 MG/5ML syrup Commonly known as:  PHENERGAN with CODEINE Take by mouth every 4 (four) hours as needed for cough.   simvastatin 20 MG tablet Commonly known as:  ZOCOR Take 1 tablet (20 mg total) by mouth daily. What changed:  when to take this      Follow-up Information    Georganna Skeans, MD. Schedule an appointment as soon as possible for a visit in 3 week(s).   Specialty:  General Surgery Contact information: [place]  [place]  [address] [phone]           Signed: Zenovia Jarred 12/23/2016, 9:39 AM

## 2016-12-27 MED FILL — CLOPIDOGREL 75 MG TABLET: 75 | 30 days supply | Qty: 30 | Fill #6

## 2016-12-27 MED FILL — LISINOPRIL 10 MG TABS: 10 | 90 days supply | Qty: 90 | Fill #3

## 2017-01-18 DIAGNOSIS — C44319 Basal cell carcinoma of skin of other parts of face: Secondary | ICD-10-CM | POA: Diagnosis not present

## 2017-01-18 DIAGNOSIS — D692 Other nonthrombocytopenic purpura: Secondary | ICD-10-CM | POA: Diagnosis not present

## 2017-01-18 DIAGNOSIS — Z85828 Personal history of other malignant neoplasm of skin: Secondary | ICD-10-CM | POA: Diagnosis not present

## 2017-01-18 DIAGNOSIS — D034 Melanoma in situ of scalp and neck: Secondary | ICD-10-CM | POA: Diagnosis not present

## 2017-01-18 DIAGNOSIS — D1801 Hemangioma of skin and subcutaneous tissue: Secondary | ICD-10-CM | POA: Diagnosis not present

## 2017-01-18 DIAGNOSIS — L821 Other seborrheic keratosis: Secondary | ICD-10-CM | POA: Diagnosis not present

## 2017-01-20 DIAGNOSIS — H401131 Primary open-angle glaucoma, bilateral, mild stage: Secondary | ICD-10-CM | POA: Diagnosis not present

## 2017-01-31 DIAGNOSIS — Z85828 Personal history of other malignant neoplasm of skin: Secondary | ICD-10-CM | POA: Diagnosis not present

## 2017-01-31 DIAGNOSIS — D034 Melanoma in situ of scalp and neck: Secondary | ICD-10-CM | POA: Diagnosis not present

## 2017-01-31 MED FILL — MUPIROCIN 2% OINTMENT: 2 | 7 days supply | Qty: 22 | Fill #0

## 2017-02-07 MED FILL — CLOPIDOGREL 75 MG TABLET: 75 | 30 days supply | Qty: 30 | Fill #7

## 2017-02-07 MED FILL — SIMVASTATIN 20 MG TABLET: 20 | 90 days supply | Qty: 90 | Fill #3

## 2017-02-09 MED FILL — LATANOPROST 0.005% EYE DRP: 0.005 | 25 days supply | Qty: 3 | Fill #1

## 2017-03-04 DIAGNOSIS — R634 Abnormal weight loss: Secondary | ICD-10-CM | POA: Diagnosis not present

## 2017-03-04 DIAGNOSIS — S61401A Unspecified open wound of right hand, initial encounter: Secondary | ICD-10-CM | POA: Diagnosis not present

## 2017-03-11 MED FILL — CLOPIDOGREL 75 MG TABLET: 75 | 30 days supply | Qty: 30 | Fill #8

## 2017-04-04 ENCOUNTER — Other Ambulatory Visit: Payer: Self-pay | Admitting: Cardiovascular Disease

## 2017-04-04 MED FILL — LATANOPROST 0.005% EYE DRP: 0.005 | 25 days supply | Qty: 3 | Fill #2

## 2017-04-05 MED FILL — LISINOPRIL 10 MG TABS: 10 | 90 days supply | Qty: 90 | Fill #0

## 2017-04-11 MED FILL — CLOPIDOGREL 75 MG TABLET: 75 | 30 days supply | Qty: 30 | Fill #9

## 2017-04-12 DIAGNOSIS — M19019 Primary osteoarthritis, unspecified shoulder: Secondary | ICD-10-CM | POA: Diagnosis not present

## 2017-04-12 DIAGNOSIS — M19011 Primary osteoarthritis, right shoulder: Secondary | ICD-10-CM | POA: Diagnosis not present

## 2017-04-12 DIAGNOSIS — M25511 Pain in right shoulder: Secondary | ICD-10-CM | POA: Diagnosis not present

## 2017-04-20 MED FILL — SIMVASTATIN 20 MG TABLET: 20 | 30 days supply | Qty: 30 | Fill #0

## 2017-04-26 DIAGNOSIS — H524 Presbyopia: Secondary | ICD-10-CM | POA: Diagnosis not present

## 2017-04-26 DIAGNOSIS — H401131 Primary open-angle glaucoma, bilateral, mild stage: Secondary | ICD-10-CM | POA: Diagnosis not present

## 2017-04-26 DIAGNOSIS — Z961 Presence of intraocular lens: Secondary | ICD-10-CM | POA: Diagnosis not present

## 2017-04-26 MED FILL — LATANOPROST 0.005% EYE DRP: 0.005 | 25 days supply | Qty: 3 | Fill #0

## 2017-05-02 MED FILL — traMADol HCL 50 MG TABS: 50 | 7 days supply | Qty: 28 | Fill #0

## 2017-05-10 ENCOUNTER — Ambulatory Visit: Payer: Medicare Other | Attending: General Surgery | Admitting: Physical Therapy

## 2017-05-10 ENCOUNTER — Other Ambulatory Visit: Payer: Self-pay

## 2017-05-10 ENCOUNTER — Encounter: Payer: Self-pay | Admitting: Physical Therapy

## 2017-05-10 DIAGNOSIS — M6281 Muscle weakness (generalized): Secondary | ICD-10-CM | POA: Diagnosis present

## 2017-05-10 DIAGNOSIS — M25651 Stiffness of right hip, not elsewhere classified: Secondary | ICD-10-CM

## 2017-05-10 DIAGNOSIS — R252 Cramp and spasm: Secondary | ICD-10-CM

## 2017-05-10 DIAGNOSIS — M25652 Stiffness of left hip, not elsewhere classified: Secondary | ICD-10-CM

## 2017-05-10 DIAGNOSIS — R2689 Other abnormalities of gait and mobility: Secondary | ICD-10-CM | POA: Insufficient documentation

## 2017-05-10 NOTE — Therapy (Signed)
[facility] [address], [place], [place], [address] Phone: [phone]   Fax:  [phone]  Physical Therapy Evaluation  Patient Details  Name: Jesus Harmon MRN: [id number] Date of Birth: 22-Apr-1929 Referring Provider: Dr. Georganna Skeans   Encounter Date: 05/10/2017  PT End of Session - 05/10/17 1335    Visit Number  1    Date for PT Re-Evaluation  07/05/17    Authorization Type  Medicare    PT Start Time  0930    PT Stop Time  1010    PT Time Calculation (min)  40 min    Activity Tolerance  Patient tolerated treatment well    Behavior During Therapy  [facility] for tasks assessed/performed       Past Medical History:  Diagnosis Date  . Arthritis    back and neck (06/16/2016)  . CAD (coronary artery disease)    a. LHC 10/2009:  pLAD 50%, small D1 70-80% (too small for PCI), EF 65% => med Rx  b. Myoview 10/2009: EF 65%, no ischemia;  c.  ETT-MV 3/14:  No ischemia, EF 63%  . Cancer [facility])    sin cancer removed nose   . History of kidney stones    "passed them"  . HLD (hyperlipidemia)    "on RX; never had high cholestrol" (06/16/2016)  . HTN (hypertension)    "on RX; never HTN" (06/16/2016)  . Migraine    "in high school" (06/16/2016)    Past Surgical History:  Procedure Laterality Date  . BASAL CELL CARCINOMA EXCISION     "one of my ears; big one on my nose; one on my left shoulder" (06/16/2016)  . CARDIAC CATHETERIZATION  2017   "treated w/RX"  . CATARACT EXTRACTION W/ INTRAOCULAR LENS  IMPLANT, BILATERAL Bilateral   . CORONARY ANGIOPLASTY WITH STENT PLACEMENT  06/16/2016  . CORONARY BALLOON ANGIOPLASTY N/A 06/16/2016   Procedure: Coronary Balloon Angioplasty;  Surgeon: Sherren Mocha, MD;  Location: [place] [facility];  Service: Cardiovascular;  Laterality: N/A;  Prox LAD  . CORONARY STENT INTERVENTION  06/16/2016   Procedure: Coronary Stent Intervention;  Surgeon: Sherren Mocha, MD;  Location: [name] [facility];   Service: Cardiovascular;;  Synergy 4.0x16 to Mid RCA  . INGUINAL HERNIA REPAIR Left 12/22/2016   Procedure: REPAIR OF LEFT INGUINAL HERNIA WITH MESH;  Surgeon: Georganna Skeans, MD;  Location: [facility];  Service: General;  Laterality: Left;  . INSERTION OF MESH Left 12/22/2016   Procedure: INSERTION OF MESH;  Surgeon: Georganna Skeans, MD;  Location: [facility];  Service: General;  Laterality: Left;  . INTRAVASCULAR PRESSURE WIRE/FFR STUDY N/A 06/16/2016   Procedure: Intravascular Pressure Wire/FFR Study;  Surgeon: Sherren Mocha, MD;  Location: [place] [facility];  Service: Cardiovascular;  Laterality: N/A;  Prox LAD  . KNEE ARTHROSCOPY     "not sure which side"  . LEFT HEART CATH AND CORONARY ANGIOGRAPHY N/A 06/16/2016   Procedure: Left Heart Cath and Coronary Angiography;  Surgeon: Sherren Mocha, MD;  Location: [place] [facility];  Service: Cardiovascular;  Laterality: N/A;  . TONSILLECTOMY    . TRANSURETHRAL RESECTION OF PROSTATE N/A 03/04/2014   Procedure: TRANSURETHRAL RESECTION OF THE PROSTATE, TRANSURETHRAL RESECTION OF THE BLADDER TUMOR;  Surgeon: Ailene Rud, MD;  Location: [facility];  Service: Urology;  Laterality: N/A;    There were no vitals filed for this visit.   Subjective Assessment - 05/10/17 0940    Subjective  Patient has had pain  since surgery. After sitting a period of time and stand felt decreased in balance.     Patient Stated Goals  reduce pain with activities    Currently in Pain?  Yes    Pain Score  9     Pain Location  Groin    Pain Orientation  Left    Pain Descriptors / Indicators  Sharp    Pain Type  Acute pain    Pain Onset  More than a month ago    Pain Frequency  Intermittent    Aggravating Factors   squat, walk up steps, yardwork    Pain Relieving Factors  sit down    Effect of Pain on Daily Activities  prevents patient from doing stuff outside, squatting         [facility] PT Assessment - 05/10/17 0001      Assessment   Medical Diagnosis  Status post  left inguinal hernia repair W10.272    Referring Provider  Dr. Georganna Skeans    Onset Date/Surgical Date  12/22/16    Prior Therapy  None      Precautions   Precautions  None      Restrictions   Weight Bearing Restrictions  No      Balance Screen   Has the patient fallen in the past 6 months  No    Has the patient had a decrease in activity level because of a fear of falling?   No    Is the patient reluctant to leave their home because of a fear of falling?   No      Home Film/video editor residence      Prior Function   Level of Independence  Independent      Cognition   Overall Cognitive Status  Within Functional Limits for tasks assessed      Observation/Other Assessments   Focus on Therapeutic Outcomes (FOTO)   80% limitation due to berg balance score      Posture/Postural Control   Posture/Postural Control  No significant limitations      ROM / Strength   AROM / PROM / Strength  AROM;Strength;PROM      AROM   Lumbar Flexion  decreased by 25%    Lumbar Extension  decreased by 75%    Lumbar - Right Side Bend  decreased by 25%    Lumbar - Left Side Bend  decreased by 25%      PROM   Right Hip External Rotation   38    Right Hip Internal Rotation   10    Left Hip Flexion  110    Left Hip External Rotation   15    Left Hip Internal Rotation   10      Strength   Right Hip Flexion  3+/5    Right Hip Extension  2+/5    Right Hip ABduction  4-/5    Left Hip Flexion  3+/5    Left Hip Extension  3/5    Left Hip ABduction  3/5      Right Hip   Right Hip Extension  0    Right Hip Flexion  110      Palpation   Palpation comment  left ilium rotated posteriorly; tenderness in the left ischiocavernosus and pubic rim       Berg Balance Test   Sit to Stand  Able to stand  independently using hands    Standing Unsupported  Able to  stand safely 2 minutes    Sitting with Back Unsupported but Feet Supported on Floor or Stool  Able to sit safely  and securely 2 minutes    Stand to Sit  Controls descent by using hands    Transfers  Able to transfer safely, definite need of hands    Standing Unsupported with Eyes Closed  Able to stand 10 seconds safely    Standing Ubsupported with Feet Together  Able to place feet together independently and stand 1 minute safely increased trunk sway    From Standing, Reach Forward with Outstretched Arm  Can reach forward >12 cm safely (5")    From Standing Position, Pick up Object from Floor  Able to pick up shoe safely and easily    From Standing Position, Turn to Look Behind Over each Shoulder  Turn sideways only but maintains balance    Turn 360 Degrees  Able to turn 360 degrees safely but slowly    Standing Unsupported, Alternately Place Feet on Step/Stool  Able to stand independently and safely and complete 8 steps in 20 seconds    Standing Unsupported, One Foot in Front  Able to take small step independently and hold 30 seconds    Standing on One Leg  Unable to try or needs assist to prevent fall    Total Score  42    Berg comment:  80% chance of falling                Objective measurements completed on examination: See above findings.    Pelvic Floor Special Questions - 05/10/17 0001    Urinary Leakage  No    Palpation  tenderness located on the left ischiocavernosus                 PT Short Term Goals - 05/10/17 1342      PT SHORT TERM GOAL #1   Title  independent with initial HEP    Time  4    Period  Weeks    Status  New    Target Date  06/07/17      PT SHORT TERM GOAL #2   Title  pain with squatting, walking, and stairs decreased >/= 25%    Time  4    Period  Weeks    Status  New    Target Date  06/07/17      PT SHORT TERM GOAL #3   Title  Berg balance score >/= 46/56    Time  4    Period  Weeks    Status  New    Target Date  06/07/17        PT Long Term Goals - 05/10/17 1343      PT LONG TERM GOAL #1   Title  independent with HEP and  understand how to progress himself    Time  8    Period  Weeks    Status  New    Target Date  07/05/17      PT LONG TERM GOAL #2   Title  Berg balance score </=52/56    Time  8    Period  Weeks    Status  New    Target Date  07/05/17      PT LONG TERM GOAL #3   Title  inguinal pain with squat, walking, stairs </= 75% due to improved tissue mobility    Time  8    Period  Weeks    Status  New    Target Date  07/05/17      PT LONG TERM GOAL #4   Title  ambulate with improved heel strike instead of shuffling his feet due to improve hip ROM and decreased in pain to decrease risk of falling    Time  8    Period  Weeks    Status  New    Target Date  07/05/17             Plan - 05/10/17 1336    Clinical Impression Statement  Patient is a 84 year old male with left inguinal pain that lingered after surgery on 12/22/2016.  Patient reports his pain level is 9/10 when he squats, walks, go up stairs and yardwork.  Pain is better with sitting.  Patient reports when he gets out of a chair he is unsteady for the first step and he has to hold onto the pew when he stands to turn.  Berg Balance score is 42/56 indication 80% chance of falling.  Lumbar, and bilateral hip ROM is limited . Bilateral hip strength is averaging 3-4/5.  Gait deficits include decreased step length, walk slowly, and shuffles his feet.  Patient has tenderness located in the left ishiocavernosus and along the left pubic rami.  Patient will benefit from skilled therapy to reduce his pain and improve his balance so he is able to perfrom daily activities.     History and Personal Factors relevant to plan of care:  Inguinal hernia repair 12/22/2016; history of cardia problems    Clinical Presentation  Evolving    Clinical Presentation due to:  balance and pain affects daily activities    Clinical Decision Making  Moderate    Rehab Potential  Excellent    Clinical Impairments Affecting Rehab Potential  Inguinal hernia repair  12/22/2016; history of cardia problems    PT Frequency  2x / week    PT Duration  8 weeks    PT Treatment/Interventions  Biofeedback;Gait training;Stair training;Therapeutic activities;Therapeutic exercise;Balance training;Patient/family education;Neuromuscular re-education;Manual techniques;Scar mobilization;Dry needling    PT Next Visit Plan  soft tissue work to the inguinal area; joint mobs to left hip; correct pelvis, abdominal strength, hip strength and ROM exercises, hip flexor stretch    PT Home Exercise Plan  progress as needed    Consulted and Agree with Plan of Care  Patient       Patient will benefit from skilled therapeutic intervention in order to improve the following deficits and impairments:  Abnormal gait, Increased fascial restricitons, Pain, Decreased scar mobility, Increased muscle spasms, Decreased strength, Decreased range of motion, Decreased activity tolerance  Visit Diagnosis: Muscle weakness (generalized) - Plan: PT plan of care cert/re-cert  Cramp and spasm - Plan: PT plan of care cert/re-cert  Other abnormalities of gait and mobility - Plan: PT plan of care cert/re-cert  Stiffness of left hip, not elsewhere classified - Plan: PT plan of care cert/re-cert  Stiffness of right hip, not elsewhere classified - Plan: PT plan of care cert/re-cert     Problem List Patient Active Problem List   Diagnosis Date Noted  . S/P inguinal hernia repair 12/22/2016  . Coronary artery disease with exertional angina ([place]) 06/16/2016  . Coronary artery disease involving native coronary artery of native heart with angina pectoris ([place]) 06/16/2016  . Benign hypertrophy of prostate 03/04/2014  . HYPERLIPIDEMIA-MIXED 02/17/2010  . HYPERTENSION, UNSPECIFIED 02/17/2010  . CAD, NATIVE VESSEL  (06/16/2016): a. sev sten of mid right coronary art PTCA and  DES , severe prox LAD stenosis unsucc PTCA, widely patent left main and left circ 11/14/2009  . FATIGUE 11/12/2009  . ARM NUMBNESS  11/12/2009  . DYSPNEA 11/12/2009    Earlie Counts, PT 05/10/17 1:48 PM    [facility] [address], [place], [place], [address] Phone: [phone]   Fax:  [phone]  Name: Jesus Harmon MRN: [id number] Date of Birth: 1929/06/27

## 2017-05-11 DIAGNOSIS — M25511 Pain in right shoulder: Secondary | ICD-10-CM | POA: Diagnosis not present

## 2017-05-16 MED FILL — SIMVASTATIN 20 MG TABLET: 20 | 90 days supply | Qty: 90 | Fill #0

## 2017-05-16 MED FILL — CLOPIDOGREL 75 MG TABLET: 75 | 30 days supply | Qty: 30 | Fill #10

## 2017-05-17 ENCOUNTER — Encounter: Payer: Self-pay | Admitting: Physical Therapy

## 2017-05-17 ENCOUNTER — Ambulatory Visit: Payer: Medicare Other | Attending: General Surgery | Admitting: Physical Therapy

## 2017-05-17 DIAGNOSIS — M25652 Stiffness of left hip, not elsewhere classified: Secondary | ICD-10-CM | POA: Diagnosis not present

## 2017-05-17 DIAGNOSIS — M25651 Stiffness of right hip, not elsewhere classified: Secondary | ICD-10-CM | POA: Diagnosis not present

## 2017-05-17 DIAGNOSIS — R2689 Other abnormalities of gait and mobility: Secondary | ICD-10-CM | POA: Diagnosis not present

## 2017-05-17 DIAGNOSIS — M6281 Muscle weakness (generalized): Secondary | ICD-10-CM

## 2017-05-17 DIAGNOSIS — R252 Cramp and spasm: Secondary | ICD-10-CM

## 2017-05-17 NOTE — Patient Instructions (Signed)
Access Code: [email or web address]  URL: [email or web address]  Date: 05/17/2017  Prepared by: Earlie Counts   Exercises  Supine Hamstring Stretch with Strap - 2 reps - 1 sets - 30 hold - 1x daily - 7x weekly  Hip Adductors and Hamstring Stretch with Strap - 2 reps - 1 sets - 30 hold - 1x daily - 7x weekly  Supine Hip External Rotation Stretch - 2 reps - 1 sets - 30 hold - 1x daily - 7x weekly  Modified Thomas Stretch - 2 reps - 1 sets - 30 hold - 1x daily - 7x weekly   [facility] [address] [place], [place] [address] Phone # [phone] Fax [phone]

## 2017-05-17 NOTE — Therapy (Signed)
[facility] [address] [place], [place], [address] Phone: [phone]   Fax:  [phone]  Physical Therapy Treatment  Patient Details  Name: Jesus Harmon MRN: [id number] Date of Birth: 08-07-1929 Referring Provider: Dr. Georganna Skeans   Encounter Date: 05/17/2017  PT End of Session - 05/17/17 1015    Visit Number  2    Date for PT Re-Evaluation  07/05/17    Authorization Type  Medicare    PT Start Time  0930    PT Stop Time  1010    PT Time Calculation (min)  40 min    Activity Tolerance  Patient tolerated treatment well    Behavior During Therapy  [facility] for tasks assessed/performed       Past Medical History:  Diagnosis Date  . Arthritis    back and neck (06/16/2016)  . CAD (coronary artery disease)    a. LHC 10/2009:  pLAD 50%, small D1 70-80% (too small for PCI), EF 65% => med Rx  b. Myoview 10/2009: EF 65%, no ischemia;  c.  ETT-MV 3/14:  No ischemia, EF 63%  . Cancer [facility])    sin cancer removed nose   . History of kidney stones    "passed them"  . HLD (hyperlipidemia)    "on RX; never had high cholestrol" (06/16/2016)  . HTN (hypertension)    "on RX; never HTN" (06/16/2016)  . Migraine    "in high school" (06/16/2016)    Past Surgical History:  Procedure Laterality Date  . BASAL CELL CARCINOMA EXCISION     "one of my ears; big one on my nose; one on my left shoulder" (06/16/2016)  . CARDIAC CATHETERIZATION  2017   "treated w/RX"  . CATARACT EXTRACTION W/ INTRAOCULAR LENS  IMPLANT, BILATERAL Bilateral   . CORONARY ANGIOPLASTY WITH STENT PLACEMENT  06/16/2016  . CORONARY BALLOON ANGIOPLASTY N/A 06/16/2016   Procedure: Coronary Balloon Angioplasty;  Surgeon: Sherren Mocha, MD;  Location: [place] [facility];  Service: Cardiovascular;  Laterality: N/A;  Prox LAD  . CORONARY STENT INTERVENTION  06/16/2016   Procedure: Coronary Stent Intervention;  Surgeon: Sherren Mocha, MD;  Location: [place] [facility];   Service: Cardiovascular;;  Synergy 4.0x16 to Mid RCA  . INGUINAL HERNIA REPAIR Left 12/22/2016   Procedure: REPAIR OF LEFT INGUINAL HERNIA WITH MESH;  Surgeon: Georganna Skeans, MD;  [facility];  Service: General;  Laterality: Left;  . INSERTION OF MESH Left 12/22/2016   Procedure: INSERTION OF MESH;  Surgeon: Georganna Skeans, MD;  [facility];  Service: General;  Laterality: Left;  . INTRAVASCULAR PRESSURE WIRE/FFR STUDY N/A 06/16/2016   Procedure: Intravascular Pressure Wire/FFR Study;  Surgeon: Sherren Mocha, MD;  Location: [place] [facility];  Service: Cardiovascular;  Laterality: N/A;  Prox LAD  . KNEE ARTHROSCOPY     "not sure which side"  . LEFT HEART CATH AND CORONARY ANGIOGRAPHY N/A 06/16/2016   Procedure: Left Heart Cath and Coronary Angiography;  Surgeon: Sherren Mocha, MD;  Location: [place] [facility];  Service: Cardiovascular;  Laterality: N/A;  . TONSILLECTOMY    . TRANSURETHRAL RESECTION OF PROSTATE N/A 03/04/2014   Procedure: TRANSURETHRAL RESECTION OF THE PROSTATE, TRANSURETHRAL RESECTION OF THE BLADDER TUMOR;  Surgeon: Ailene Rud, MD;  [facility];  Service: Urology;  Laterality: N/A;    There were no vitals filed for this visit.  Subjective Assessment - 05/17/17 0933    Subjective  I felt good after the  last session.     Patient Stated Goals  reduce pain with activities    Currently in Pain?  Yes    Pain Score  9     Pain Location  Groin    Pain Orientation  Left    Pain Descriptors / Indicators  Sharp    Pain Type  Acute pain    Pain Onset  More than a month ago    Pain Frequency  Intermittent    Aggravating Factors   bending over, squatting, yardwork, steps    Pain Relieving Factors  sit down                       [facility] Adult PT Treatment/Exercise - 05/17/17 0001      Manual Therapy   Manual Therapy  Joint mobilization;Soft tissue mobilization;Myofascial release    Manual therapy comments  manually stretche d left hip  ER and hamstring    Joint Mobilization  left hip with mulligan strap for distraction, lateral glide, inferior glide and posterior glide    Soft tissue mobilization  left hip adductor, where the abdominals attach to the pubic bone on the left, ischiococcygeus             PT Education - 05/17/17 1014    Education provided  Yes    Education Details  Access Code: [email or web address]    Person(s) Educated  Patient    Methods  Explanation;Demonstration;Handout    Comprehension  Verbalized understanding;Returned demonstration       PT Short Term Goals - 05/17/17 1019      PT SHORT TERM GOAL #1   Title  independent with initial HEP    Time  4    Period  Weeks    Status  On-going      PT SHORT TERM GOAL #2   Title  pain with squatting, walking, and stairs decreased >/= 25%    Time  4    Period  Weeks    Status  On-going      PT SHORT TERM GOAL #3   Title  Berg balance score >/= 46/56    Time  4    Period  Weeks    Status  On-going        PT Long Term Goals - 05/10/17 1343      PT LONG TERM GOAL #1   Title  independent with HEP and understand how to progress himself    Time  8    Period  Weeks    Status  New    Target Date  07/05/17      PT LONG TERM GOAL #2   Title  Berg balance score </=52/56    Time  8    Period  Weeks    Status  New    Target Date  07/05/17      PT LONG TERM GOAL #3   Title  inguinal pain with squat, walking, stairs </= 75% due to improved tissue mobility    Time  8    Period  Weeks    Status  New    Target Date  07/05/17      PT LONG TERM GOAL #4   Title  ambulate with improved heel strike instead of shuffling his feet due to improve hip ROM and decreased in pain to decrease risk of falling    Time  8    Period  Weeks    Status  New  Target Date  07/05/17            Plan - 05/17/17 1015    Clinical Impression Statement  After manual work, patient had no pain with bending forward.  Patient has tight hamstrings, hip flexors and hip  adductors.  Patient was able to demonstrate the stretching program correctly.  Patient was able to stand up straight after therapy for first time.  Patient will benefit from skilled therapy to reduce pain and improve his balance so he is able to perfrom daily activities.     Rehab Potential  Excellent    Clinical Impairments Affecting Rehab Potential  Inguinal hernia repair 12/22/2016; history of cardia problems    PT Frequency  2x / week    PT Duration  8 weeks    PT Treatment/Interventions  Biofeedback;Gait training;Stair training;Therapeutic activities;Therapeutic exercise;Balance training;Patient/family education;Neuromuscular re-education;Manual techniques;Scar mobilization;Dry needling    PT Next Visit Plan  soft tissue work to the inguinal area; joint mobs to left hip; correct pelvis, abdominal strength, hip strength in standing especially hip extension    PT Home Exercise Plan  Access Code: [email or web address]    Consulted and Agree with Plan of Care  Patient       Patient will benefit from skilled therapeutic intervention in order to improve the following deficits and impairments:  Abnormal gait, Increased fascial restricitons, Pain, Decreased scar mobility, Increased muscle spasms, Decreased strength, Decreased range of motion, Decreased activity tolerance  Visit Diagnosis: Muscle weakness (generalized)  Cramp and spasm  Other abnormalities of gait and mobility  Stiffness of left hip, not elsewhere classified  Stiffness of right hip, not elsewhere classified     Problem List Patient Active Problem List   Diagnosis Date Noted  . S/P inguinal hernia repair 12/22/2016  . Coronary artery disease with exertional angina ([place]) 06/16/2016  . Coronary artery disease involving native coronary artery of native heart with angina pectoris ([place]) 06/16/2016  . Benign hypertrophy of prostate 03/04/2014  . HYPERLIPIDEMIA-MIXED 02/17/2010  . HYPERTENSION, UNSPECIFIED 02/17/2010  . CAD, NATIVE  VESSEL  (06/16/2016): a. sev sten of mid right coronary art PTCA and DES , severe prox LAD stenosis unsucc PTCA, widely patent left main and left circ 11/14/2009  . FATIGUE 11/12/2009  . ARM NUMBNESS 11/12/2009  . DYSPNEA 11/12/2009    Earlie Counts, PT 05/17/17 10:20 AM   [place] [facility] [address], [place], [place], [address] Phone: [phone]   Fax:  [phone]  Name: Jesus Harmon MRN: [id number] Date of Birth: [birth year]

## 2017-05-19 ENCOUNTER — Ambulatory Visit: Payer: Medicare Other | Admitting: Physical Therapy

## 2017-05-19 ENCOUNTER — Encounter: Payer: Self-pay | Admitting: Physical Therapy

## 2017-05-19 DIAGNOSIS — M25651 Stiffness of right hip, not elsewhere classified: Secondary | ICD-10-CM | POA: Diagnosis not present

## 2017-05-19 DIAGNOSIS — R2689 Other abnormalities of gait and mobility: Secondary | ICD-10-CM

## 2017-05-19 DIAGNOSIS — M6281 Muscle weakness (generalized): Secondary | ICD-10-CM | POA: Diagnosis not present

## 2017-05-19 DIAGNOSIS — M25652 Stiffness of left hip, not elsewhere classified: Secondary | ICD-10-CM

## 2017-05-19 DIAGNOSIS — R252 Cramp and spasm: Secondary | ICD-10-CM

## 2017-05-19 NOTE — Therapy (Signed)
[facility] [address] [place], [place], [address] Phone: [phone]   Fax:  [phone]  Physical Therapy Treatment  Patient Details  Name: Jesus Harmon MRN: [id number] Date of Birth: Jan 15, 1929 Referring Provider: Dr. Georganna Skeans   Encounter Date: 05/19/2017  PT End of Session - 05/19/17 1019    Visit Number  3    Date for PT Re-Evaluation  07/05/17    Authorization Type  Medicare    PT Start Time  9326    PT Stop Time  1055    PT Time Calculation (min)  40 min    Activity Tolerance  Patient tolerated treatment well    Behavior During Therapy  [facility] for tasks assessed/performed       Past Medical History:  Diagnosis Date  . Arthritis    back and neck (06/16/2016)  . CAD (coronary artery disease)    a. LHC 10/2009:  pLAD 50%, small D1 70-80% (too small for PCI), EF 65% => med Rx  b. Myoview 10/2009: EF 65%, no ischemia;  c.  ETT-MV 3/14:  No ischemia, EF 63%  . Cancer [facility])    sin cancer removed nose   . History of kidney stones    "passed them"  . HLD (hyperlipidemia)    "on RX; never had high cholestrol" (06/16/2016)  . HTN (hypertension)    "on RX; never HTN" (06/16/2016)  . Migraine    "in high school" (06/16/2016)    Past Surgical History:  Procedure Laterality Date  . BASAL CELL CARCINOMA EXCISION     "one of my ears; big one on my nose; one on my left shoulder" (06/16/2016)  . CARDIAC CATHETERIZATION  2017   "treated w/RX"  . CATARACT EXTRACTION W/ INTRAOCULAR LENS  IMPLANT, BILATERAL Bilateral   . CORONARY ANGIOPLASTY WITH STENT PLACEMENT  06/16/2016  . CORONARY BALLOON ANGIOPLASTY N/A 06/16/2016   Procedure: Coronary Balloon Angioplasty;  Surgeon: Sherren Mocha, MD;  Location: [place] [facility];  Service: Cardiovascular;  Laterality: N/A;  Prox LAD  . CORONARY STENT INTERVENTION  06/16/2016   Procedure: Coronary Stent Intervention;  Surgeon: Sherren Mocha, MD;  Location: [place] [facility];   Service: Cardiovascular;;  Synergy 4.0x16 to Mid RCA  . INGUINAL HERNIA REPAIR Left 12/22/2016   Procedure: REPAIR OF LEFT INGUINAL HERNIA WITH MESH;  Surgeon: Georganna Skeans, MD;  [facility]: [facility];  Service: General;  Laterality: Left;  . INSERTION OF MESH Left 12/22/2016   Procedure: INSERTION OF MESH;  Surgeon: Georganna Skeans, MD;  [facility]: [facility];  Service: General;  Laterality: Left;  . INTRAVASCULAR PRESSURE WIRE/FFR STUDY N/A 06/16/2016   Procedure: Intravascular Pressure Wire/FFR Study;  Surgeon: Sherren Mocha, MD;  Location: [place] [facility];  Service: Cardiovascular;  Laterality: N/A;  Prox LAD  . KNEE ARTHROSCOPY     "not sure which side"  . LEFT HEART CATH AND CORONARY ANGIOGRAPHY N/A 06/16/2016   Procedure: Left Heart Cath and Coronary Angiography;  Surgeon: Sherren Mocha, MD;  Location: [place] [facility];  Service: Cardiovascular;  Laterality: N/A;  . TONSILLECTOMY    . TRANSURETHRAL RESECTION OF PROSTATE N/A 03/04/2014   Procedure: TRANSURETHRAL RESECTION OF THE PROSTATE, TRANSURETHRAL RESECTION OF THE BLADDER TUMOR;  Surgeon: Ailene Rud, MD;  Location: [facility];  Service: Urology;  Laterality: N/A;    There were no vitals filed for this visit.  Subjective Assessment - 05/19/17 1016    Subjective  I feel good when I  leave but when I go home and I have pain.      Patient Stated Goals  reduce pain with activities    Currently in Pain?  Yes    Pain Score  9     Pain Location  Groin    Pain Orientation  Left    Pain Descriptors / Indicators  Sharp    Pain Onset  More than a month ago    Pain Frequency  Intermittent    Aggravating Factors   bending down to pick up something, yardwork    Pain Relieving Factors  sit down    Multiple Pain Sites  No         [facility] PT Assessment - 05/19/17 0001      Palpation   Palpation comment  tenderness over the femoral triangle                    [facility] Adult PT Treatment/Exercise - 05/19/17 0001       Self-Care   Self-Care  Other Self-Care Comments    Other Self-Care Comments   information on tips to avoid falls due to him feel unsteady      Therapeutic Activites    Therapeutic Activities  Lifting    Lifting  squatting to lift items with wide base of support, erect spine, breathing to reduce left groin pain; sit to stand and back with hands in groin to promote increased hip flexion and not using his hands      Manual Therapy   Manual Therapy  Myofascial release    Joint Mobilization  to anterior left groin to release the tissue around the surgical site             PT Education - 05/19/17 1145    Education provided  Yes    Education Details  Access Code: [email or web address]     Person(s) Educated  Patient    Methods  Explanation;Demonstration;Verbal cues;Handout    Comprehension  Returned demonstration;Verbalized understanding       PT Short Term Goals - 05/19/17 1150      PT SHORT TERM GOAL #1   Title  independent with initial HEP    Time  4    Period  Weeks    Status  Achieved      PT SHORT TERM GOAL #2   Title  pain with squatting, walking, and stairs decreased >/= 25%    Time  4    Period  Weeks    Status  On-going      PT SHORT TERM GOAL #3   Title  Berg balance score >/= 46/56    Time  4    Period  Weeks    Status  On-going        PT Long Term Goals - 05/10/17 1343      PT LONG TERM GOAL #1   Title  independent with HEP and understand how to progress himself    Time  8    Period  Weeks    Status  New    Target Date  07/05/17      PT LONG TERM GOAL #2   Title  Berg balance score </=52/56    Time  8    Period  Weeks    Status  New    Target Date  07/05/17      PT LONG TERM GOAL #3   Title  inguinal pain with squat, walking, stairs </= 75% due to improved  tissue mobility    Time  8    Period  Weeks    Status  New    Target Date  07/05/17      PT LONG TERM GOAL #4   Title  ambulate with improved heel strike instead of shuffling his feet due to  improve hip ROM and decreased in pain to decrease risk of falling    Time  8    Period  Weeks    Status  New    Target Date  07/05/17            Plan - 05/19/17 1146    Clinical Impression Statement  Patient feels good after he leaves therapy.  Patient reports his pain will come back after he has been lifting items at home.  Patient came into therapy with a flexed posture.  Patient was instructed on correct posture to decrease strain on the left groin, how to lift items with a wide stance and erect spine, going from sit to stand with hips in groin and erect spine.  Patient is able to go from sitting to standing without hands when he increases his hip flexion and increase thoracic extension.  When patient leaves therapy he is able to stand upright. Patient will benefit from skilled therapy to reduce pain and improve his balance so he is able to perform daily activities.     Rehab Potential  Excellent    Clinical Impairments Affecting Rehab Potential  Inguinal hernia repair 12/22/2016; history of cardia problems    PT Frequency  2x / week    PT Duration  8 weeks    PT Treatment/Interventions  Biofeedback;Gait training;Stair training;Therapeutic activities;Therapeutic exercise;Balance training;Patient/family education;Neuromuscular re-education;Manual techniques;Scar mobilization;Dry needling    PT Next Visit Plan  work on balance, erect trunk, soft tissue work    PT Home Exercise Plan  Access Code: [email or web address]    Consulted and Agree with Plan of Care  Patient       Patient will benefit from skilled therapeutic intervention in order to improve the following deficits and impairments:  Abnormal gait, Increased fascial restricitons, Pain, Decreased scar mobility, Increased muscle spasms, Decreased strength, Decreased range of motion, Decreased activity tolerance  Visit Diagnosis: Muscle weakness (generalized)  Cramp and spasm  Other abnormalities of gait and mobility  Stiffness of left hip,  not elsewhere classified  Stiffness of right hip, not elsewhere classified     Problem List Patient Active Problem List   Diagnosis Date Noted  . S/P inguinal hernia repair 12/22/2016  . Coronary artery disease with exertional angina ([place]) 06/16/2016  . Coronary artery disease involving native coronary artery of native heart with angina pectoris ([place]) 06/16/2016  . Benign hypertrophy of prostate 03/04/2014  . HYPERLIPIDEMIA-MIXED 02/17/2010  . HYPERTENSION, UNSPECIFIED 02/17/2010  . CAD, NATIVE VESSEL  (06/16/2016): a. sev sten of mid right coronary art PTCA and DES , severe prox LAD stenosis unsucc PTCA, widely patent left main and left circ 11/14/2009  . FATIGUE 11/12/2009  . ARM NUMBNESS 11/12/2009  . DYSPNEA 11/12/2009    Earlie Counts, PT 05/19/17 11:51 AM   [facility] [address] [place], [place], [address] Phone: [phone]   Fax:  [phone]  Name: Jesus Harmon MRN: [id number] Date of Birth: [birth year]

## 2017-05-19 NOTE — Patient Instructions (Signed)
Access Code: [email or web address]  URL: [email or web address]  Date: 05/19/2017  Prepared by: Earlie Counts   Exercises  Supine Hamstring Stretch with Strap - 2 reps - 1 sets - 30 hold - 1x daily - 7x weekly  Hip Adductors and Hamstring Stretch with Strap - 2 reps - 1 sets - 30 hold - 1x daily - 7x weekly  Supine Hip External Rotation Stretch - 2 reps - 1 sets - 30 hold - 1x daily - 7x weekly  Modified Thomas Stretch - 2 reps - 1 sets - 30 hold - 1x daily - 7x weekly  Overhead Y Squat - 15 reps - 1 sets - 1x daily - 7x weekly  Sit to Stand with Hands on Knees - 10 reps - 1 sets - 1x daily - 7x weekly  Standing Hip Extension with Counter Support - 10 reps - 3 sets - 1x daily - 7x weekly  Patient Education  Lifting Techniques  Forward Head Posture  How to Prevent [facility] [address] [place], [place] [address] Phone # [phone] Fax [phone]

## 2017-05-23 ENCOUNTER — Ambulatory Visit: Payer: Medicare Other | Admitting: Physical Therapy

## 2017-05-23 ENCOUNTER — Encounter: Payer: Self-pay | Admitting: Physical Therapy

## 2017-05-23 DIAGNOSIS — R252 Cramp and spasm: Secondary | ICD-10-CM

## 2017-05-23 DIAGNOSIS — M6281 Muscle weakness (generalized): Secondary | ICD-10-CM

## 2017-05-23 DIAGNOSIS — R2689 Other abnormalities of gait and mobility: Secondary | ICD-10-CM

## 2017-05-23 DIAGNOSIS — M25652 Stiffness of left hip, not elsewhere classified: Secondary | ICD-10-CM

## 2017-05-23 DIAGNOSIS — M25651 Stiffness of right hip, not elsewhere classified: Secondary | ICD-10-CM

## 2017-05-23 NOTE — Therapy (Signed)
[facility] [address], [place], [place], [address] Phone: [phone]   Fax:  [phone]  Physical Therapy Treatment  Patient Details  Name: Jesus Harmon MRN: [id number] Date of Birth: 20-May-1929 Referring Provider: Dr. Georganna Skeans   Encounter Date: 05/23/2017  PT End of Session - 05/23/17 0917    Visit Number  4    Date for PT Re-Evaluation  07/05/17    Authorization Type  Medicare    PT Start Time  0917    PT Stop Time  1014    PT Time Calculation (min)  57 min    Activity Tolerance  Patient tolerated treatment well    Behavior During Therapy  [facility] for tasks assessed/performed       Past Medical History:  Diagnosis Date  . Arthritis    back and neck (06/16/2016)  . CAD (coronary artery disease)    a. LHC 10/2009:  pLAD 50%, small D1 70-80% (too small for PCI), EF 65% => med Rx  b. Myoview 10/2009: EF 65%, no ischemia;  c.  ETT-MV 3/14:  No ischemia, EF 63%  . Cancer [facility])    sin cancer removed nose   . History of kidney stones    "passed them"  . HLD (hyperlipidemia)    "on RX; never had high cholestrol" (06/16/2016)  . HTN (hypertension)    "on RX; never HTN" (06/16/2016)  . Migraine    "in high school" (06/16/2016)    Past Surgical History:  Procedure Laterality Date  . BASAL CELL CARCINOMA EXCISION     "one of my ears; big one on my nose; one on my left shoulder" (06/16/2016)  . CARDIAC CATHETERIZATION  2017   "treated w/RX"  . CATARACT EXTRACTION W/ INTRAOCULAR LENS  IMPLANT, BILATERAL Bilateral   . CORONARY ANGIOPLASTY WITH STENT PLACEMENT  06/16/2016  . CORONARY BALLOON ANGIOPLASTY N/A 06/16/2016   Procedure: Coronary Balloon Angioplasty;  Surgeon: Sherren Mocha, MD;  Location: [place] [facility];  Service: Cardiovascular;  Laterality: N/A;  Prox LAD  . CORONARY STENT INTERVENTION  06/16/2016   Procedure: Coronary Stent Intervention;  Surgeon: Sherren Mocha, MD;  Location: [place] [facility];   Service: Cardiovascular;;  Synergy 4.0x16 to Mid RCA  . INGUINAL HERNIA REPAIR Left 12/22/2016   Procedure: REPAIR OF LEFT INGUINAL HERNIA WITH MESH;  Surgeon: Georganna Skeans, MD;  [facility];  Service: General;  Laterality: Left;  . INSERTION OF MESH Left 12/22/2016   Procedure: INSERTION OF MESH;  Surgeon: Georganna Skeans, MD;  [facility]: [facility];  Service: General;  Laterality: Left;  . INTRAVASCULAR PRESSURE WIRE/FFR STUDY N/A 06/16/2016   Procedure: Intravascular Pressure Wire/FFR Study;  Surgeon: Sherren Mocha, MD;  Location: [place] [facility];  Service: Cardiovascular;  Laterality: N/A;  Prox LAD  . KNEE ARTHROSCOPY     "not sure which side"  . LEFT HEART CATH AND CORONARY ANGIOGRAPHY N/A 06/16/2016   Procedure: Left Heart Cath and Coronary Angiography;  Surgeon: Sherren Mocha, MD;  Location: [place] [facility];  Service: Cardiovascular;  Laterality: N/A;  . TONSILLECTOMY    . TRANSURETHRAL RESECTION OF PROSTATE N/A 03/04/2014   Procedure: TRANSURETHRAL RESECTION OF THE PROSTATE, TRANSURETHRAL RESECTION OF THE BLADDER TUMOR;  Surgeon: Ailene Rud, MD;  [facility];  Service: Urology;  Laterality: N/A;    There were no vitals filed for this visit.  Subjective Assessment - 05/23/17 0919    Subjective  No new complaints this AM.  I am working on my posture. No pain complaints this AM.    Currently in Pain?  No/denies    Multiple Pain Sites  No                       [facility] Adult PT Treatment/Exercise - 05/23/17 0001      Neuro Re-ed    Neuro Re-ed Details   Tandem stance Bil with yellow band rows & extensions for posture 10x ach Given for HEP.       Knee/Hip Exercises: Stretches   Other Knee/Hip Stretches  Adductor stretches bil with pillow for props 3x10      Knee/Hip Exercises: Aerobic   Other Aerobic  Arm bike L0 reverse 4 min Concurrent status update with PTA      Knee/Hip Exercises: Seated   Clamshell with TheraBand  Green 20x       Shoulder Exercises: Seated   Extension  -- Yellow band 2x10 with focus on balance and posture    Horizontal ABduction  Strengthening;Both;20 reps;Theraband Postural verbal cues    Theraband Level (Shoulder Horizontal ABduction)  Level 1 (Yellow)      Manual Therapy   Manual Therapy  Myofascial release    Joint Mobilization  to anterior left groin to release the tissue around the surgical site ripple up and down with instrument             PT Education - 05/23/17 0937    Education provided  Yes    Education Details  HEP    Person(s) Educated  Patient    Methods  Explanation;Demonstration;Tactile cues;Verbal cues;Handout    Comprehension  Verbalized understanding;Returned demonstration       PT Short Term Goals - 05/19/17 1150      PT SHORT TERM GOAL #1   Title  independent with initial HEP    Time  4    Period  Weeks    Status  Achieved      PT SHORT TERM GOAL #2   Title  pain with squatting, walking, and stairs decreased >/= 25%    Time  4    Period  Weeks    Status  On-going      PT SHORT TERM GOAL #3   Title  Berg balance score >/= 46/56    Time  4    Period  Weeks    Status  On-going        PT Long Term Goals - 05/10/17 1343      PT LONG TERM GOAL #1   Title  independent with HEP and understand how to progress himself    Time  8    Period  Weeks    Status  New    Target Date  07/05/17      PT LONG TERM GOAL #2   Title  Berg balance score </=52/56    Time  8    Period  Weeks    Status  New    Target Date  07/05/17      PT LONG TERM GOAL #3   Title  inguinal pain with squat, walking, stairs </= 75% due to improved tissue mobility    Time  8    Period  Weeks    Status  New    Target Date  07/05/17      PT LONG TERM GOAL #4   Title  ambulate with improved heel strike instead of shuffling his feet due to improve hip  ROM and decreased in pain to decrease risk of falling    Time  8    Period  Weeks    Status  New    Target Date  07/05/17             Plan - 05/23/17 1478    Clinical Impression Statement  Pt presented today without complaints of pain. He did report he had some over the weekend after mowing. He is working hard on his posture and staying mre erect in his trunk but he says he forgets and doesn't know when he starts to slump. Pt's HEP was advanced today: included postural strength/endurance using resistance bands in addition to performing them with tandem stance to also work on his balance. He also worked on stretches for his adductors which was most difficult: RT > LT. Pt mainly required verbal cuing on performing his exercises slowly especailly the eccentric portion of the exercise.     Rehab Potential  Excellent    Clinical Impairments Affecting Rehab Potential  Inguinal hernia repair 12/22/2016; history of cardia problems    PT Frequency  2x / week    PT Duration  8 weeks    PT Treatment/Interventions  Biofeedback;Gait training;Stair training;Therapeutic activities;Therapeutic exercise;Balance training;Patient/family education;Neuromuscular re-education;Manual techniques;Scar mobilization;Dry needling    PT Next Visit Plan  work on balance, erect trunk, soft tissue work    PT Home Exercise Plan  Access Code: [email or web address]    Consulted and Agree with Plan of Care  Patient       Patient will benefit from skilled therapeutic intervention in order to improve the following deficits and impairments:  Abnormal gait, Increased fascial restricitons, Pain, Decreased scar mobility, Increased muscle spasms, Decreased strength, Decreased range of motion, Decreased activity tolerance  Visit Diagnosis: Muscle weakness (generalized)  Cramp and spasm  Other abnormalities of gait and mobility  Stiffness of left hip, not elsewhere classified  Stiffness of right hip, not elsewhere classified     Problem List Patient Active Problem List   Diagnosis Date Noted  . S/P inguinal hernia repair 12/22/2016  . Coronary artery  disease with exertional angina ([place]) 06/16/2016  . Coronary artery disease involving native coronary artery of native heart with angina pectoris ([place]) 06/16/2016  . Benign hypertrophy of prostate 03/04/2014  . HYPERLIPIDEMIA-MIXED 02/17/2010  . HYPERTENSION, UNSPECIFIED 02/17/2010  . CAD, NATIVE VESSEL  (06/16/2016): a. sev sten of mid right coronary art PTCA and DES , severe prox LAD stenosis unsucc PTCA, widely patent left main and left circ 11/14/2009  . FATIGUE 11/12/2009  . ARM NUMBNESS 11/12/2009  . DYSPNEA 11/12/2009    Jesus Harmon, PTA 05/23/2017, 11:06 AM  [facility] [address], [place], [place], [address] Phone: [phone]   Fax:  [phone]  Name: Jesus Harmon MRN: [id number] Date of Birth: [birth year]

## 2017-05-25 ENCOUNTER — Encounter: Payer: Self-pay | Admitting: Physical Therapy

## 2017-05-25 ENCOUNTER — Ambulatory Visit: Payer: Medicare Other | Admitting: Physical Therapy

## 2017-05-25 DIAGNOSIS — M25652 Stiffness of left hip, not elsewhere classified: Secondary | ICD-10-CM | POA: Diagnosis not present

## 2017-05-25 DIAGNOSIS — M6281 Muscle weakness (generalized): Secondary | ICD-10-CM

## 2017-05-25 DIAGNOSIS — R2689 Other abnormalities of gait and mobility: Secondary | ICD-10-CM

## 2017-05-25 DIAGNOSIS — M25651 Stiffness of right hip, not elsewhere classified: Secondary | ICD-10-CM

## 2017-05-25 DIAGNOSIS — R252 Cramp and spasm: Secondary | ICD-10-CM

## 2017-05-25 NOTE — Therapy (Signed)
[facility] [address] [place], [place], [address] Phone: [phone]   Fax:  [phone]  Physical Therapy Treatment  Patient Details  Name: Jesus Harmon MRN: [id number] Date of Birth: [birth year] Referring Provider: Dr. Georganna Skeans   Encounter Date: 05/25/2017  PT End of Session - 05/25/17 1057    Visit Number  5    Date for PT Re-Evaluation  07/05/17    Authorization Type  Medicare    PT Start Time  1027    PT Stop Time  1055    PT Time Calculation (min)  40 min    Activity Tolerance  Patient tolerated treatment well;No increased pain    Behavior During Therapy  WFL for tasks assessed/performed       Past Medical History:  Diagnosis Date  . Arthritis    back and neck (06/16/2016)  . CAD (coronary artery disease)    a. LHC 10/2009:  pLAD 50%, small D1 70-80% (too small for PCI), EF 65% => med Rx  b. Myoview 10/2009: EF 65%, no ischemia;  c.  ETT-MV 3/14:  No ischemia, EF 63%  . Cancer [facility])    sin cancer removed nose   . History of kidney stones    "passed them"  . HLD (hyperlipidemia)    "on RX; never had high cholestrol" (06/16/2016)  . HTN (hypertension)    "on RX; never HTN" (06/16/2016)  . Migraine    "in high school" (06/16/2016)    Past Surgical History:  Procedure Laterality Date  . BASAL CELL CARCINOMA EXCISION     "one of my ears; big one on my nose; one on my left shoulder" (06/16/2016)  . CARDIAC CATHETERIZATION  2017   "treated w/RX"  . CATARACT EXTRACTION W/ INTRAOCULAR LENS  IMPLANT, BILATERAL Bilateral   . CORONARY ANGIOPLASTY WITH STENT PLACEMENT  06/16/2016  . CORONARY BALLOON ANGIOPLASTY N/A 06/16/2016   Procedure: Coronary Balloon Angioplasty;  Surgeon: Sherren Mocha, MD;  Location: [place] [facility];  Service: Cardiovascular;  Laterality: N/A;  Prox LAD  . CORONARY STENT INTERVENTION  06/16/2016   Procedure: Coronary Stent Intervention;  Surgeon: Sherren Mocha, MD;  Location: [place] [facility];  Service: Cardiovascular;;  Synergy 4.0x16 to Mid RCA  . INGUINAL HERNIA REPAIR Left 12/22/2016   Procedure: REPAIR OF LEFT INGUINAL HERNIA WITH MESH;  Surgeon: Georganna Skeans, MD;  Location: [facility];  Service: General;  Laterality: Left;  . INSERTION OF MESH Left 12/22/2016   Procedure: INSERTION OF MESH;  Surgeon: Georganna Skeans, MD;  Location: [facility];  Service: General;  Laterality: Left;  . INTRAVASCULAR PRESSURE WIRE/FFR STUDY N/A 06/16/2016   Procedure: Intravascular Pressure Wire/FFR Study;  Surgeon: Sherren Mocha, MD;  Location: [place] [facility];  Service: Cardiovascular;  Laterality: N/A;  Prox LAD  . KNEE ARTHROSCOPY     "not sure which side"  . LEFT HEART CATH AND CORONARY ANGIOGRAPHY N/A 06/16/2016   Procedure: Left Heart Cath and Coronary Angiography;  Surgeon: Sherren Mocha, MD;  Location: [place] [facility];  Service: Cardiovascular;  Laterality: N/A;  . TONSILLECTOMY    . TRANSURETHRAL RESECTION OF PROSTATE N/A 03/04/2014   Procedure: TRANSURETHRAL RESECTION OF THE PROSTATE, TRANSURETHRAL RESECTION OF THE BLADDER TUMOR;  Surgeon: Ailene Rud, MD;  Location: [facility];  Service: Urology;  Laterality: N/A;    There were no vitals filed for this visit.  Subjective Assessment - 05/25/17 1023    Subjective  I have pain  after I planted some plants.     Patient Stated Goals  reduce pain with activities    Currently in Pain?  No/denies    Multiple Pain Sites  No                       [facility] Adult PT Treatment/Exercise - 05/25/17 0001      Therapeutic Activites    Therapeutic Activities  Lifting;Other Therapeutic Activities    Lifting  Squatting to lift with breath and wide stance phase    Other Therapeutic Activities  taking frequent rests during strenours activities to avoid straining the area      Knee/Hip Exercises: Aerobic   Stationary Bike  level 1 for 6 min      Knee/Hip Exercises: Standing   Other Standing Knee Exercises  lean  over hip extension 10x bil.     Other Standing Knee Exercises  single leg stance with hand hold 15 sec with right side harder than left      Knee/Hip Exercises: Seated   Sit to Sand  1 set;15 reps;without UE support      Knee/Hip Exercises: Supine   Bridges with Clamshell  Strengthening;Both;1 set;20 reps red band verbal cues    Straight Leg Raises  Strengthening;Right;Left;1 set;10 reps abdominal bracing with breath    Knee Flexion  Strengthening;1 set;Left;Both;10 reps isometric with abdominal bracing    Other Supine Knee/Hip Exercises  hookly bring yellow plyoball diagonals with abdominal bracing 10x each way             PT Education - 05/25/17 1056    Education provided  Yes    Education Details  squat to lift, taking rest with heavy work    [employer]) Educated  Patient    Methods  Explanation;Demonstration    Comprehension  Verbalized understanding;Returned demonstration       PT Short Term Goals - 05/19/17 1150      PT SHORT TERM GOAL #1   Title  independent with initial HEP    Time  4    Period  Weeks    Status  Achieved      PT SHORT TERM GOAL #2   Title  pain with squatting, walking, and stairs decreased >/= 25%    Time  4    Period  Weeks    Status  On-going      PT SHORT TERM GOAL #3   Title  Berg balance score >/= 46/56    Time  4    Period  Weeks    Status  On-going        PT Long Term Goals - 05/10/17 1343      PT LONG TERM GOAL #1   Title  independent with HEP and understand how to progress himself    Time  8    Period  Weeks    Status  New    Target Date  07/05/17      PT LONG TERM GOAL #2   Title  Berg balance score </=52/56    Time  8    Period  Weeks    Status  New    Target Date  07/05/17      PT LONG TERM GOAL #3   Title  inguinal pain with squat, walking, stairs </= 75% due to improved tissue mobility    Time  8    Period  Weeks    Status  New    Target Date  07/05/17      PT LONG TERM GOAL #4   Title  ambulate with  improved heel strike instead of shuffling his feet due to improve hip ROM and decreased in pain to decrease risk of falling    Time  8    Period  Weeks    Status  New    Target Date  07/05/17            Plan - 05/25/17 1058    Clinical Impression Statement  Patient come in without pain but when he is doing his planting, contious lifting, and tilling the ground he will have increased groin pain.  This may be due to incorrect technique and straining the area with his tasks.  Patient is sitting up straighter.  Patient will benefit from skilled therapy to work on lifting technique, core and hip strength and manual technique to reduce pain.     Rehab Potential  Excellent    Clinical Impairments Affecting Rehab Potential  Inguinal hernia repair 12/22/2016; history of cardia problems    PT Frequency  2x / week    PT Duration  8 weeks    PT Treatment/Interventions  Biofeedback;Gait training;Stair training;Therapeutic activities;Therapeutic exercise;Balance training;Patient/family education;Neuromuscular re-education;Manual techniques;Scar mobilization;Dry needling    PT Next Visit Plan  work on balance, erect trunk, soft tissue work    Oncologist with Plan of Care  Patient       Patient will benefit from skilled therapeutic intervention in order to improve the following deficits and impairments:  Abnormal gait, Increased fascial restricitons, Pain, Decreased scar mobility, Increased muscle spasms, Decreased strength, Decreased range of motion, Decreased activity tolerance  Visit Diagnosis: Muscle weakness (generalized)  Cramp and spasm  Other abnormalities of gait and mobility  Stiffness of left hip, not elsewhere classified  Stiffness of right hip, not elsewhere classified     Problem List Patient Active Problem List   Diagnosis Date Noted  . S/P inguinal hernia repair 12/22/2016  . Coronary artery disease with exertional angina ([place]) 06/16/2016  . Coronary artery  disease involving native coronary artery of native heart with angina pectoris ([place]) 06/16/2016  . Benign hypertrophy of prostate 03/04/2014  . HYPERLIPIDEMIA-MIXED 02/17/2010  . HYPERTENSION, UNSPECIFIED 02/17/2010  . CAD, NATIVE VESSEL  (06/16/2016): a. sev sten of mid right coronary art PTCA and DES , severe prox LAD stenosis unsucc PTCA, widely patent left main and left circ 11/14/2009  . FATIGUE 11/12/2009  . ARM NUMBNESS 11/12/2009  . DYSPNEA 11/12/2009    Earlie Counts, PT 05/25/17 11:03 AM   [facility] [address], [place], [place], [address] Phone: [phone]   Fax:  [phone]  Name: JAWON DIPIERO MRN: [id number] Date of Birth: Feb 28, 1929

## 2017-05-30 ENCOUNTER — Encounter: Payer: Self-pay | Admitting: Physical Therapy

## 2017-05-30 ENCOUNTER — Ambulatory Visit: Payer: Medicare Other | Admitting: Physical Therapy

## 2017-05-30 DIAGNOSIS — M6281 Muscle weakness (generalized): Secondary | ICD-10-CM

## 2017-05-30 DIAGNOSIS — M25652 Stiffness of left hip, not elsewhere classified: Secondary | ICD-10-CM

## 2017-05-30 DIAGNOSIS — R2689 Other abnormalities of gait and mobility: Secondary | ICD-10-CM

## 2017-05-30 DIAGNOSIS — M25651 Stiffness of right hip, not elsewhere classified: Secondary | ICD-10-CM

## 2017-05-30 DIAGNOSIS — R252 Cramp and spasm: Secondary | ICD-10-CM

## 2017-05-30 NOTE — Therapy (Signed)
[facility] [address] [place], [place], [address] Phone: [phone]   Fax:  [phone]  Physical Therapy Treatment  Patient Details  Name: Jesus Harmon MRN: [id number] Date of Birth: [birth year] Referring Provider: Dr. Georganna Skeans   Encounter Date: 05/30/2017  PT End of Session - 05/30/17 1024    Visit Number  6    Date for PT Re-Evaluation  07/05/17    Authorization Type  Medicare    PT Start Time  1008    PT Stop Time  1093    PT Time Calculation (min)  44 min    Activity Tolerance  Patient tolerated treatment well;No increased pain    Behavior During Therapy  WFL for tasks assessed/performed       Past Medical History:  Diagnosis Date  . Arthritis    back and neck (06/16/2016)  . CAD (coronary artery disease)    a. LHC 10/2009:  pLAD 50%, small D1 70-80% (too small for PCI), EF 65% => med Rx  b. Myoview 10/2009: EF 65%, no ischemia;  c.  ETT-MV 3/14:  No ischemia, EF 63%  . Cancer [facility])    sin cancer removed nose   . History of kidney stones    "passed them"  . HLD (hyperlipidemia)    "on RX; never had high cholestrol" (06/16/2016)  . HTN (hypertension)    "on RX; never HTN" (06/16/2016)  . Migraine    "in high school" (06/16/2016)    Past Surgical History:  Procedure Laterality Date  . BASAL CELL CARCINOMA EXCISION     "one of my ears; big one on my nose; one on my left shoulder" (06/16/2016)  . CARDIAC CATHETERIZATION  2017   "treated w/RX"  . CATARACT EXTRACTION W/ INTRAOCULAR LENS  IMPLANT, BILATERAL Bilateral   . CORONARY ANGIOPLASTY WITH STENT PLACEMENT  06/16/2016  . CORONARY BALLOON ANGIOPLASTY N/A 06/16/2016   Procedure: Coronary Balloon Angioplasty;  Surgeon: Sherren Mocha, MD;  Location: [place] [facility];  Service: Cardiovascular;  Laterality: N/A;  Prox LAD  . CORONARY STENT INTERVENTION  06/16/2016   Procedure: Coronary Stent Intervention;  Surgeon: Sherren Mocha, MD;  Location: [place] [facility];  Service: Cardiovascular;;  Synergy 4.0x16 to Mid RCA  . INGUINAL HERNIA REPAIR Left 12/22/2016   Procedure: REPAIR OF LEFT INGUINAL HERNIA WITH MESH;  Surgeon: Georganna Skeans, MD;  [facility];  Service: General;  Laterality: Left;  . INSERTION OF MESH Left 12/22/2016   Procedure: INSERTION OF MESH;  Surgeon: Georganna Skeans, MD;  [facility];  Service: General;  Laterality: Left;  . INTRAVASCULAR PRESSURE WIRE/FFR STUDY N/A 06/16/2016   Procedure: Intravascular Pressure Wire/FFR Study;  Surgeon: Sherren Mocha, MD;  Location: [place] [facility];  Service: Cardiovascular;  Laterality: N/A;  Prox LAD  . KNEE ARTHROSCOPY     "not sure which side"  . LEFT HEART CATH AND CORONARY ANGIOGRAPHY N/A 06/16/2016   Procedure: Left Heart Cath and Coronary Angiography;  Surgeon: Sherren Mocha, MD;  Location: [place] [facility];  Service: Cardiovascular;  Laterality: N/A;  . TONSILLECTOMY    . TRANSURETHRAL RESECTION OF PROSTATE N/A 03/04/2014   Procedure: TRANSURETHRAL RESECTION OF THE PROSTATE, TRANSURETHRAL RESECTION OF THE BLADDER TUMOR;  Surgeon: Ailene Rud, MD;  Location: [facility];  Service: Urology;  Laterality: N/A;    There were no vitals filed for this visit.  Subjective Assessment - 05/30/17 1025    Subjective  Better after therapy,  better with some rest, the more bending I do then I feel it.     Currently in Pain?  Yes    Pain Score  2     Pain Location  Groin    Pain Orientation  Left    Pain Descriptors / Indicators  Dull;Sore    Aggravating Factors   bending    Pain Relieving Factors  sitting     Multiple Pain Sites  No                       [facility] Adult PT Treatment/Exercise - 05/30/17 0001      Knee/Hip Exercises: Aerobic   Stationary Bike  L2 x 10 min PTA present for status update      Knee/Hip Exercises: Standing   Hip Flexion  Stengthening;Left;2 sets;10 reps;Knee bent    Hip Extension  Stengthening;Left;2 sets;10 reps;Knee  straight    Functional Squat  -- To green band hands on hi low table 2x10    SLS  LTLE on black square 4x 10 sec light UE assit required    Walking with Sports Cord  20# forward light CGA 10x       Knee/Hip Exercises: Supine   Bridges with Ball Squeeze  Strengthening;Both;2 sets;10 reps Ball squeeze 10 x 3 sec hold for prep    Bridges with Clamshell  Strengthening;Both;1 set;20 reps red band verbal cues    Straight Leg Raises  Strengthening;Left;2 sets;10 reps               PT Short Term Goals - 05/19/17 1150      PT SHORT TERM GOAL #1   Title  independent with initial HEP    Time  4    Period  Weeks    Status  Achieved      PT SHORT TERM GOAL #2   Title  pain with squatting, walking, and stairs decreased >/= 25%    Time  4    Period  Weeks    Status  On-going      PT SHORT TERM GOAL #3   Title  Berg balance score >/= 46/56    Time  4    Period  Weeks    Status  On-going        PT Long Term Goals - 05/10/17 1343      PT LONG TERM GOAL #1   Title  independent with HEP and understand how to progress himself    Time  8    Period  Weeks    Status  New    Target Date  07/05/17      PT LONG TERM GOAL #2   Title  Berg balance score </=52/56    Time  8    Period  Weeks    Status  New    Target Date  07/05/17      PT LONG TERM GOAL #3   Title  inguinal pain with squat, walking, stairs </= 75% due to improved tissue mobility    Time  8    Period  Weeks    Status  New    Target Date  07/05/17      PT LONG TERM GOAL #4   Title  ambulate with improved heel strike instead of shuffling his feet due to improve hip ROM and decreased in pain to decrease risk of falling    Time  8    Period  Weeks    Status  New    Target Date  07/05/17            Plan - 05/30/17 1024    Clinical Impression Statement  Pt conitnues to have increased LT groin symptoms with prolonged work in the yard. Pains allieviate with sitting/rest. Pt did not have any pain with his hip  exercises today, he reports feeling his muscles work ( quads mainly with squatting).  Pt still requires some assit to balance on his LTLE     Rehab Potential  Excellent    Clinical Impairments Affecting Rehab Potential  Inguinal hernia repair 12/22/2016; history of cardia problems    PT Frequency  2x / week    PT Duration  8 weeks    PT Treatment/Interventions  Biofeedback;Gait training;Stair training;Therapeutic activities;Therapeutic exercise;Balance training;Patient/family education;Neuromuscular re-education;Manual techniques;Scar mobilization;Dry needling    PT Next Visit Plan  work on balance, erect trunk, soft tissue work, adavnce HEP in next 1-2 visits    PT Home Exercise Plan  Access Code: [email or web address]    Consulted and Agree with Plan of Care  Patient       Patient will benefit from skilled therapeutic intervention in order to improve the following deficits and impairments:  Abnormal gait, Increased fascial restricitons, Pain, Decreased scar mobility, Increased muscle spasms, Decreased strength, Decreased range of motion, Decreased activity tolerance  Visit Diagnosis: Muscle weakness (generalized)  Cramp and spasm  Other abnormalities of gait and mobility  Stiffness of left hip, not elsewhere classified  Stiffness of right hip, not elsewhere classified     Problem List Patient Active Problem List   Diagnosis Date Noted  . S/P inguinal hernia repair 12/22/2016  . Coronary artery disease with exertional angina ([place]) 06/16/2016  . Coronary artery disease involving native coronary artery of native heart with angina pectoris ([place]) 06/16/2016  . Benign hypertrophy of prostate 03/04/2014  . HYPERLIPIDEMIA-MIXED 02/17/2010  . HYPERTENSION, UNSPECIFIED 02/17/2010  . CAD, NATIVE VESSEL  (06/16/2016): a. sev sten of mid right coronary art PTCA and DES , severe prox LAD stenosis unsucc PTCA, widely patent left main and left circ 11/14/2009  . FATIGUE 11/12/2009  . ARM NUMBNESS  11/12/2009  . DYSPNEA 11/12/2009    Blair Lundeen, PTA 05/30/2017, 10:56 AM  [facility] [address] [place], [place], [address] Phone: [phone]   Fax:  [phone]  Name: Jesus Harmon MRN: [id number] Date of Birth: 06-11-1929

## 2017-06-01 ENCOUNTER — Ambulatory Visit: Payer: Medicare Other | Admitting: Physical Therapy

## 2017-06-01 ENCOUNTER — Encounter: Payer: Self-pay | Admitting: Physical Therapy

## 2017-06-01 DIAGNOSIS — M6281 Muscle weakness (generalized): Secondary | ICD-10-CM

## 2017-06-01 DIAGNOSIS — R2689 Other abnormalities of gait and mobility: Secondary | ICD-10-CM

## 2017-06-01 DIAGNOSIS — R252 Cramp and spasm: Secondary | ICD-10-CM

## 2017-06-01 DIAGNOSIS — M25652 Stiffness of left hip, not elsewhere classified: Secondary | ICD-10-CM

## 2017-06-01 DIAGNOSIS — M25651 Stiffness of right hip, not elsewhere classified: Secondary | ICD-10-CM

## 2017-06-01 NOTE — Therapy (Signed)
[facility] [address], [place], [place], [address] Phone: [phone]   Fax:  [phone]  Physical Therapy Treatment  Patient Details  Name: Jesus Harmon MRN: [id number] Date of Birth: 1929-04-19 Referring Provider: Dr. Georganna Skeans   Encounter Date: 06/01/2017  PT End of Session - 06/01/17 1008    Visit Number  7    Date for PT Re-Evaluation  07/05/17    Authorization Type  Medicare    PT Start Time  1008    PT Stop Time  1050    PT Time Calculation (min)  42 min    Activity Tolerance  Patient tolerated treatment well;No increased pain    Behavior During Therapy  WFL for tasks assessed/performed       Past Medical History:  Diagnosis Date  . Arthritis    back and neck (06/16/2016)  . CAD (coronary artery disease)    a. LHC 10/2009:  pLAD 50%, small D1 70-80% (too small for PCI), EF 65% => med Rx  b. Myoview 10/2009: EF 65%, no ischemia;  c.  ETT-MV 3/14:  No ischemia, EF 63%  . Cancer [facility])    sin cancer removed nose   . History of kidney stones    "passed them"  . HLD (hyperlipidemia)    "on RX; never had high cholestrol" (06/16/2016)  . HTN (hypertension)    "on RX; never HTN" (06/16/2016)  . Migraine    "in high school" (06/16/2016)    Past Surgical History:  Procedure Laterality Date  . BASAL CELL CARCINOMA EXCISION     "one of my ears; big one on my nose; one on my left shoulder" (06/16/2016)  . CARDIAC CATHETERIZATION  2017   "treated w/RX"  . CATARACT EXTRACTION W/ INTRAOCULAR LENS  IMPLANT, BILATERAL Bilateral   . CORONARY ANGIOPLASTY WITH STENT PLACEMENT  06/16/2016  . CORONARY BALLOON ANGIOPLASTY N/A 06/16/2016   Procedure: Coronary Balloon Angioplasty;  Surgeon: Sherren Mocha, MD;  Location: [place] [facility];  Service: Cardiovascular;  Laterality: N/A;  Prox LAD  . CORONARY STENT INTERVENTION  06/16/2016   Procedure: Coronary Stent Intervention;  Surgeon: Sherren Mocha, MD;  Location: [place] [facility];  Service: Cardiovascular;;  Synergy 4.0x16 to Mid RCA  . INGUINAL HERNIA REPAIR Left 12/22/2016   Procedure: REPAIR OF LEFT INGUINAL HERNIA WITH MESH;  Surgeon: Georganna Skeans, MD;  [facility]: [facility];  Service: General;  Laterality: Left;  . INSERTION OF MESH Left 12/22/2016   Procedure: INSERTION OF MESH;  Surgeon: Georganna Skeans, MD;  [facility]: [facility];  Service: General;  Laterality: Left;  . INTRAVASCULAR PRESSURE WIRE/FFR STUDY N/A 06/16/2016   Procedure: Intravascular Pressure Wire/FFR Study;  Surgeon: Sherren Mocha, MD;  Location: [place] [facility];  Service: Cardiovascular;  Laterality: N/A;  Prox LAD  . KNEE ARTHROSCOPY     "not sure which side"  . LEFT HEART CATH AND CORONARY ANGIOGRAPHY N/A 06/16/2016   Procedure: Left Heart Cath and Coronary Angiography;  Surgeon: Sherren Mocha, MD;  Location: [place] [facility];  Service: Cardiovascular;  Laterality: N/A;  . TONSILLECTOMY    . TRANSURETHRAL RESECTION OF PROSTATE N/A 03/04/2014   Procedure: TRANSURETHRAL RESECTION OF THE PROSTATE, TRANSURETHRAL RESECTION OF THE BLADDER TUMOR;  Surgeon: Ailene Rud, MD;  Location: [facility];  Service: Urology;  Laterality: N/A;    There were no vitals filed for this visit.  Subjective Assessment - 06/01/17 1010    Subjective  No pain this  morning. No sharp pains anymore.     Patient Stated Goals  reduce pain with activities    Currently in Pain?  No/denies    Multiple Pain Sites  No                       [facility] Adult PT Treatment/Exercise - 06/01/17 0001      Knee/Hip Exercises: Aerobic   Stationary Bike  L2 x 10 min PTA present for status update      Knee/Hip Exercises: Standing   Hip Flexion  Stengthening;Left;2 sets;10 reps;Knee bent added 2# WTS today     Hip Abduction  Stengthening;Left;2 sets;10 reps;Knee straight 2# added TC for correct form    Hip Extension  Stengthening;Left;2 sets;10 reps;Knee straight added 2# wts today, VC to keep knee  straight    Functional Squat  -- To green band hands on hi low table 2x10    SLS  LTLE on black square 4x 10 sec light UE assit required Triend a few without holding on/couldn't do    Walking with Sports Cord  20# forward light CGA 10x       Knee/Hip Exercises: Seated   Long Arc Quad  Strengthening;Left;1 set;15 reps;Weights    Long Arc Quad Weight  2 lbs.      Knee/Hip Exercises: Supine   Bridges with Diona Foley Squeeze  Strengthening;Both;2 sets;10 reps Ball squeeze 10 x 3 sec hold for prep    Bridges with Clamshell  Strengthening;Both;1 set;20 reps red band verbal cues    Straight Leg Raises  Strengthening;Left;2 sets;10 reps VC for neutral alignment LTLE VS rotating outwards               PT Short Term Goals - 05/19/17 1150      PT SHORT TERM GOAL #1   Title  independent with initial HEP    Time  4    Period  Weeks    Status  Achieved      PT SHORT TERM GOAL #2   Title  pain with squatting, walking, and stairs decreased >/= 25%    Time  4    Period  Weeks    Status  On-going      PT SHORT TERM GOAL #3   Title  Berg balance score >/= 46/56    Time  4    Period  Weeks    Status  On-going        PT Long Term Goals - 05/10/17 1343      PT LONG TERM GOAL #1   Title  independent with HEP and understand how to progress himself    Time  8    Period  Weeks    Status  New    Target Date  07/05/17      PT LONG TERM GOAL #2   Title  Berg balance score </=52/56    Time  8    Period  Weeks    Status  New    Target Date  07/05/17      PT LONG TERM GOAL #3   Title  inguinal pain with squat, walking, stairs </= 75% due to improved tissue mobility    Time  8    Period  Weeks    Status  New    Target Date  07/05/17      PT LONG TERM GOAL #4   Title  ambulate with improved heel strike instead of shuffling his feet due to improve  hip ROM and decreased in pain to decrease risk of falling    Time  8    Period  Weeks    Status  New    Target Date  07/05/17             Plan - 06/01/17 1008    Clinical Impression Statement  Pt reports he does not get sharps pains anymore, and describes a more dull sensation intermittently when working in the yard. Progressed hip strengthening exercises today by adding weights  which he tolerated very well. No pain with increased resistance. He mainly needs technical/alignment verbal cues. Pt had difficulty performing hip abduction correctly as he externally rotates and uses more hip flexor. He improved some with practice.  Pt used little to no UE during his squats today, demonstrating improved use of his LE.     Rehab Potential  Excellent    Clinical Impairments Affecting Rehab Potential  Inguinal hernia repair 12/22/2016; history of cardia problems    PT Frequency  2x / week    PT Duration  8 weeks    PT Treatment/Interventions  Biofeedback;Gait training;Stair training;Therapeutic activities;Therapeutic exercise;Balance training;Patient/family education;Neuromuscular re-education;Manual techniques;Scar mobilization;Dry needling    PT Next Visit Plan  work on balance, erect trunk, soft tissue work, adavnce HEP in next visit.    PT Home Exercise Plan  Access Code: [email or web address]    Consulted and Agree with Plan of Care  Patient       Patient will benefit from skilled therapeutic intervention in order to improve the following deficits and impairments:  Abnormal gait, Increased fascial restricitons, Pain, Decreased scar mobility, Increased muscle spasms, Decreased strength, Decreased range of motion, Decreased activity tolerance  Visit Diagnosis: Muscle weakness (generalized)  Cramp and spasm  Other abnormalities of gait and mobility  Stiffness of left hip, not elsewhere classified  Stiffness of right hip, not elsewhere classified     Problem List Patient Active Problem List   Diagnosis Date Noted  . S/P inguinal hernia repair 12/22/2016  . Coronary artery disease with exertional angina ([place]) 06/16/2016  .  Coronary artery disease involving native coronary artery of native heart with angina pectoris ([place]) 06/16/2016  . Benign hypertrophy of prostate 03/04/2014  . HYPERLIPIDEMIA-MIXED 02/17/2010  . HYPERTENSION, UNSPECIFIED 02/17/2010  . CAD, NATIVE VESSEL  (06/16/2016): a. sev sten of mid right coronary art PTCA and DES , severe prox LAD stenosis unsucc PTCA, widely patent left main and left circ 11/14/2009  . FATIGUE 11/12/2009  . ARM NUMBNESS 11/12/2009  . DYSPNEA 11/12/2009    Seena Face, PTA 06/01/2017, 10:57 AM  [facility] [address], [place], [place], [address] Phone: [phone]   Fax:  [phone]  Name: PERFECTO PURDY MRN: [id number] Date of Birth: [birth year]

## 2017-06-07 ENCOUNTER — Encounter: Payer: Self-pay | Admitting: Physical Therapy

## 2017-06-07 ENCOUNTER — Ambulatory Visit: Payer: Medicare Other | Admitting: Physical Therapy

## 2017-06-07 DIAGNOSIS — M25652 Stiffness of left hip, not elsewhere classified: Secondary | ICD-10-CM

## 2017-06-07 DIAGNOSIS — R2689 Other abnormalities of gait and mobility: Secondary | ICD-10-CM

## 2017-06-07 DIAGNOSIS — M6281 Muscle weakness (generalized): Secondary | ICD-10-CM

## 2017-06-07 DIAGNOSIS — M25651 Stiffness of right hip, not elsewhere classified: Secondary | ICD-10-CM

## 2017-06-07 DIAGNOSIS — R252 Cramp and spasm: Secondary | ICD-10-CM

## 2017-06-07 NOTE — Patient Instructions (Addendum)
With Support    Stand on one leg in neutral spine holding support. Hold _5-10___ seconds. Repeat on other leg. Do __5__ repetitions, __1__ sets. Every day http://bt.exer.[email or web address]   Copyright  VHI. All rights reserved.  [place] [address], [place], [place] [address] Phone # [phone] Fax [phone]

## 2017-06-07 NOTE — Therapy (Signed)
[facility] [address] [place], [place], [address] Phone: [phone]   Fax:  [phone]  Physical Therapy Treatment  Patient Details  Name: Jesus Harmon MRN: [id number] Date of Birth: Dec 30, 1929 Referring Provider: Dr. Georganna Skeans   Encounter Date: 06/07/2017  PT End of Session - 06/07/17 1100    Visit Number  8    Authorization Type  Medicare    PT Start Time  2542    PT Stop Time  7062    PT Time Calculation (min)  43 min    Activity Tolerance  Patient tolerated treatment well;No increased pain    Behavior During Therapy  WFL for tasks assessed/performed       Past Medical History:  Diagnosis Date  . Arthritis    back and neck (06/16/2016)  . CAD (coronary artery disease)    a. LHC 10/2009:  pLAD 50%, small D1 70-80% (too small for PCI), EF 65% => med Rx  b. Myoview 10/2009: EF 65%, no ischemia;  c.  ETT-MV 3/14:  No ischemia, EF 63%  . Cancer [facility])    sin cancer removed nose   . History of kidney stones    "passed them"  . HLD (hyperlipidemia)    "on RX; never had high cholestrol" (06/16/2016)  . HTN (hypertension)    "on RX; never HTN" (06/16/2016)  . Migraine    "in high school" (06/16/2016)    Past Surgical History:  Procedure Laterality Date  . BASAL CELL CARCINOMA EXCISION     "one of my ears; big one on my nose; one on my left shoulder" (06/16/2016)  . CARDIAC CATHETERIZATION  2017   "treated w/RX"  . CATARACT EXTRACTION W/ INTRAOCULAR LENS  IMPLANT, BILATERAL Bilateral   . CORONARY ANGIOPLASTY WITH STENT PLACEMENT  06/16/2016  . CORONARY BALLOON ANGIOPLASTY N/A 06/16/2016   Procedure: Coronary Balloon Angioplasty;  Surgeon: Sherren Mocha, MD;  Location: [place] [facility];  Service: Cardiovascular;  Laterality: N/A;  Prox LAD  . CORONARY STENT INTERVENTION  06/16/2016   Procedure: Coronary Stent Intervention;  Surgeon: Sherren Mocha, MD;  Location: [place] [facility];  Service:  Cardiovascular;;  Synergy 4.0x16 to Mid RCA  . INGUINAL HERNIA REPAIR Left 12/22/2016   Procedure: REPAIR OF LEFT INGUINAL HERNIA WITH MESH;  Surgeon: Georganna Skeans, MD;  [facility];  Service: General;  Laterality: Left;  . INSERTION OF MESH Left 12/22/2016   Procedure: INSERTION OF MESH;  Surgeon: Georganna Skeans, MD;  [facility];  Service: General;  Laterality: Left;  . INTRAVASCULAR PRESSURE WIRE/FFR STUDY N/A 06/16/2016   Procedure: Intravascular Pressure Wire/FFR Study;  Surgeon: Sherren Mocha, MD;  Location: [place] [facility];  Service: Cardiovascular;  Laterality: N/A;  Prox LAD  . KNEE ARTHROSCOPY     "not sure which side"  . LEFT HEART CATH AND CORONARY ANGIOGRAPHY N/A 06/16/2016   Procedure: Left Heart Cath and Coronary Angiography;  Surgeon: Sherren Mocha, MD;  Location: [place] [facility];  Service: Cardiovascular;  Laterality: N/A;  . TONSILLECTOMY    . TRANSURETHRAL RESECTION OF PROSTATE N/A 03/04/2014   Procedure: TRANSURETHRAL RESECTION OF THE PROSTATE, TRANSURETHRAL RESECTION OF THE BLADDER TUMOR;  Surgeon: Ailene Rud, MD;  [facility];  Service: Urology;  Laterality: N/A;    There were no vitals filed for this visit.  Subjective Assessment - 06/07/17 1023    Subjective  I am not getting the sharp pain anymore.  When I do  alot of work there is more pain.     Patient Stated Goals  reduce pain with activities    Currently in Pain?  Yes    Pain Score  5     Pain Location  Groin    Pain Orientation  Left    Pain Descriptors / Indicators  Dull    Pain Type  Acute pain    Pain Onset  More than a month ago    Pain Frequency  Intermittent    Aggravating Factors   heavy work; bending over    Pain Relieving Factors  sitting    Multiple Pain Sites  No         [facility] PT Assessment - 06/07/17 0001      Berg Balance Test   Sit to Stand  Able to stand without using hands and stabilize independently    Standing Unsupported  Able to stand safely 2  minutes    Sitting with Back Unsupported but Feet Supported on Floor or Stool  Able to sit safely and securely 2 minutes    Stand to Sit  Sits safely with minimal use of hands    Transfers  Able to transfer safely, minor use of hands    Standing Unsupported with Eyes Closed  Able to stand 10 seconds safely    Standing Ubsupported with Feet Together  Able to place feet together independently and stand 1 minute safely    From Standing, Reach Forward with Outstretched Arm  Can reach confidently >25 cm (10")    From Standing Position, Pick up Object from Floor  Able to pick up shoe safely and easily    From Standing Position, Turn to Look Behind Over each Shoulder  Looks behind from both sides and weight shifts well    Turn 360 Degrees  Able to turn 360 degrees safely but slowly    Standing Unsupported, Alternately Place Feet on Step/Stool  Able to stand independently and safely and complete 8 steps in 20 seconds    Standing Unsupported, One Foot in Front  Able to plae foot ahead of the other independently and hold 30 seconds    Standing on One Leg  Tries to lift leg/unable to hold 3 seconds but remains standing independently    Total Score  50    Berg comment:  50% chance of falling                   [facility] Adult PT Treatment/Exercise - 06/07/17 0001      Knee/Hip Exercises: Seated   Sit to Sand  10 reps;without UE support working on squat with buttocks over mat      Manual Therapy   Manual Therapy  Soft tissue mobilization    Soft tissue mobilization  soft tissue work to left inguinal area             PT Education - 06/07/17 1046    Education provided  Yes    Education Details  one legged stance    Person(s) Educated  Patient    Methods  Explanation;Demonstration;Handout    Comprehension  Verbalized understanding;Returned demonstration       PT Short Term Goals - 06/07/17 1102      PT SHORT TERM GOAL #2   Title  pain with squatting, walking, and stairs decreased  >/= 25%    Time  4    Period  Weeks    Status  Achieved      PT SHORT  TERM GOAL #3   Title  Berg balance score >/= 46/56    Time  4    Period  Weeks    Status  Achieved        PT Long Term Goals - 05/10/17 1343      PT LONG TERM GOAL #1   Title  independent with HEP and understand how to progress himself    Time  8    Period  Weeks    Status  New    Target Date  07/05/17      PT LONG TERM GOAL #2   Title  Berg balance score </=52/56    Time  8    Period  Weeks    Status  New    Target Date  07/05/17      PT LONG TERM GOAL #3   Title  inguinal pain with squat, walking, stairs </= 75% due to improved tissue mobility    Time  8    Period  Weeks    Status  New    Target Date  07/05/17      PT LONG TERM GOAL #4   Title  ambulate with improved heel strike instead of shuffling his feet due to improve hip ROM and decreased in pain to decrease risk of falling    Time  8    Period  Weeks    Status  New    Target Date  07/05/17            Plan - 06/07/17 1100    Clinical Impression Statement  Patient Berg balance improves to 50/56 indicating 50% chance of falaaing compared to 80%.  Patient is still performing is usual and is heavy lifting.  The left inguinal area is puffer than the right.  Patient almost lost his balance when is was showing therpist how he squats. Patient will benefit from skilled therapy to improve strength and balance.     Rehab Potential  Excellent    Clinical Impairments Affecting Rehab Potential  Inguinal hernia repair 12/22/2016; history of cardia problems    PT Frequency  2x / week    PT Duration  8 weeks    PT Treatment/Interventions  Biofeedback;Gait training;Stair training;Therapeutic activities;Therapeutic exercise;Balance training;Patient/family education;Neuromuscular re-education;Manual techniques;Scar mobilization;Dry needling    PT Next Visit Plan  work on balance, erect trunk, soft tissue work, adavnce HEP in next visit.    Consulted  and Agree with Plan of Care  Patient       Patient will benefit from skilled therapeutic intervention in order to improve the following deficits and impairments:  Abnormal gait, Increased fascial restricitons, Pain, Decreased scar mobility, Increased muscle spasms, Decreased strength, Decreased range of motion, Decreased activity tolerance  Visit Diagnosis: Muscle weakness (generalized)  Cramp and spasm  Other abnormalities of gait and mobility  Stiffness of left hip, not elsewhere classified  Stiffness of right hip, not elsewhere classified     Problem List Patient Active Problem List   Diagnosis Date Noted  . S/P inguinal hernia repair 12/22/2016  . Coronary artery disease with exertional angina ([place]) 06/16/2016  . Coronary artery disease involving native coronary artery of native heart with angina pectoris ([place]) 06/16/2016  . Benign hypertrophy of prostate 03/04/2014  . HYPERLIPIDEMIA-MIXED 02/17/2010  . HYPERTENSION, UNSPECIFIED 02/17/2010  . CAD, NATIVE VESSEL  (06/16/2016): a. sev sten of mid right coronary art PTCA and DES , severe prox LAD stenosis unsucc PTCA, widely patent left main and left circ 11/14/2009  .  FATIGUE 11/12/2009  . ARM NUMBNESS 11/12/2009  . DYSPNEA 11/12/2009    Earlie Counts, PT 06/07/17 11:04 AM    [facility] [address] [place], [place], [address] Phone: [phone]   Fax:  [phone]  Name: Jesus Harmon MRN: [id number] Date of Birth: 01/18/1929

## 2017-06-09 ENCOUNTER — Ambulatory Visit: Payer: Medicare Other | Admitting: Physical Therapy

## 2017-06-09 ENCOUNTER — Encounter: Payer: Self-pay | Admitting: Physical Therapy

## 2017-06-09 DIAGNOSIS — M6281 Muscle weakness (generalized): Secondary | ICD-10-CM

## 2017-06-09 DIAGNOSIS — M25652 Stiffness of left hip, not elsewhere classified: Secondary | ICD-10-CM

## 2017-06-09 DIAGNOSIS — R252 Cramp and spasm: Secondary | ICD-10-CM

## 2017-06-09 DIAGNOSIS — R2689 Other abnormalities of gait and mobility: Secondary | ICD-10-CM

## 2017-06-09 MED FILL — CLOPIDOGREL 75 MG TABLET: 75 | 30 days supply | Qty: 30 | Fill #11

## 2017-06-09 NOTE — Therapy (Signed)
[facility] [address] [place], [place], [address] Phone: [phone]   Fax:  [phone] Physical Therapy Progress Note  Dates of Reporting Period: 05/10/2017 to 06/09/2017    Physical Therapy Treatment  Patient Details  Name: Jesus Harmon MRN: [id number] Date of Birth: [birth year] Referring Provider: Dr. Georganna Skeans   Encounter Date: 06/09/2017  PT End of Session - 06/09/17 1053    Visit Number  9    Date for PT Re-Evaluation  07/05/17    Authorization Type  Medicare    PT Start Time  5188    PT Stop Time  1054    PT Time Calculation (min)  39 min    Activity Tolerance  Patient tolerated treatment well;No increased pain    Behavior During Therapy  WFL for tasks assessed/performed       Past Medical History:  Diagnosis Date  . Arthritis    back and neck (06/16/2016)  . CAD (coronary artery disease)    a. LHC 10/2009:  pLAD 50%, small D1 70-80% (too small for PCI), EF 65% => med Rx  b. Myoview 10/2009: EF 65%, no ischemia;  c.  ETT-MV 3/14:  No ischemia, EF 63%  . Cancer [facility])    sin cancer removed nose   . History of kidney stones    "passed them"  . HLD (hyperlipidemia)    "on RX; never had high cholestrol" (06/16/2016)  . HTN (hypertension)    "on RX; never HTN" (06/16/2016)  . Migraine    "in high school" (06/16/2016)    Past Surgical History:  Procedure Laterality Date  . BASAL CELL CARCINOMA EXCISION     "one of my ears; big one on my nose; one on my left shoulder" (06/16/2016)  . CARDIAC CATHETERIZATION  2017   "treated w/RX"  . CATARACT EXTRACTION W/ INTRAOCULAR LENS  IMPLANT, BILATERAL Bilateral   . CORONARY ANGIOPLASTY WITH STENT PLACEMENT  06/16/2016  . CORONARY BALLOON ANGIOPLASTY N/A 06/16/2016   Procedure: Coronary Balloon Angioplasty;  Surgeon: Sherren Mocha, MD;  Location: [place] [facility];  Service: Cardiovascular;  Laterality: N/A;  Prox LAD  . CORONARY STENT INTERVENTION  06/16/2016    Procedure: Coronary Stent Intervention;  Surgeon: Sherren Mocha, MD;  Location: [place] [facility];  Service: Cardiovascular;;  Synergy 4.0x16 to Mid RCA  . INGUINAL HERNIA REPAIR Left 12/22/2016   Procedure: REPAIR OF LEFT INGUINAL HERNIA WITH MESH;  Surgeon: Georganna Skeans, MD;  Location: [facility];  Service: General;  Laterality: Left;  . INSERTION OF MESH Left 12/22/2016   Procedure: INSERTION OF MESH;  Surgeon: Georganna Skeans, MD;  [facility]: [facility];  Service: General;  Laterality: Left;  . INTRAVASCULAR PRESSURE WIRE/FFR STUDY N/A 06/16/2016   Procedure: Intravascular Pressure Wire/FFR Study;  Surgeon: Sherren Mocha, MD;  Location: [place] [facility];  Service: Cardiovascular;  Laterality: N/A;  Prox LAD  . KNEE ARTHROSCOPY     "not sure which side"  . LEFT HEART CATH AND CORONARY ANGIOGRAPHY N/A 06/16/2016   Procedure: Left Heart Cath and Coronary Angiography;  Surgeon: Sherren Mocha, MD;  Location: [place] [facility];  Service: Cardiovascular;  Laterality: N/A;  . TONSILLECTOMY    . TRANSURETHRAL RESECTION OF PROSTATE N/A 03/04/2014   Procedure: TRANSURETHRAL RESECTION OF THE PROSTATE, TRANSURETHRAL RESECTION OF THE BLADDER TUMOR;  Surgeon: Ailene Rud, MD;  Location: [facility];  Service: Urology;  Laterality: N/A;    There were no vitals filed for this visit.  Subjective Assessment - 06/09/17 1017    Subjective  I picked up a 50 pound bag of sand with no pain.     Patient Stated Goals  reduce pain with activities    Currently in Pain?  No/denies    Multiple Pain Sites  No         [facility] PT Assessment - 06/09/17 0001      Assessment   Medical Diagnosis  Status post left inguinal hernia repair Z61.096    Referring Provider  Dr. Georganna Skeans    Onset Date/Surgical Date  12/22/16    Prior Therapy  None      Precautions   Precautions  None      Restrictions   Weight Bearing Restrictions  No      Prior Function   Level of Independence  Independent       Cognition   Overall Cognitive Status  Within Functional Limits for tasks assessed      Posture/Postural Control   Posture/Postural Control  No significant limitations      AROM   Lumbar Flexion  decreased by 25%    Lumbar Extension  decreased by 75%    Lumbar - Right Side Bend  decreased by 25%    Lumbar - Left Side Bend  decreased by 25%      PROM   Right Hip External Rotation   38    Right Hip Internal Rotation   10    Left Hip Flexion  110    Left Hip External Rotation   15    Left Hip Internal Rotation   10      Strength   Right Hip Flexion  5/5    Right Hip Extension  3-/5    Right Hip ABduction  4-/5    Left Hip Flexion  4/5    Left Hip Extension  3-/5    Left Hip ABduction  5/5      Right Hip   Right Hip Extension  0    Right Hip Flexion  110      Palpation   Palpation comment  tenderness over the femoral triangle                    [facility] Adult PT Treatment/Exercise - 06/09/17 0001      Therapeutic Activites    Lifting  lifting correctly and working with lifting less weight      Knee/Hip Exercises: Aerobic   Stationary Bike  L5 x 6 min PTA present for status update      Knee/Hip Exercises: Machines for Strengthening   Cybex Leg Press  50# bil. 3x10; seat #8, incline #3      Knee/Hip Exercises: Standing   Hip Abduction  Stengthening;Right;Left;1 set;10 reps red band    Hip Extension  Stengthening;Right;Left;1 set;10 reps;Knee straight red band    Forward Step Up  Left;Right;1 set;10 reps;Step Height: 6";Hand Hold: 1      Knee/Hip Exercises: Seated   Sit to Sand  1 set;10 reps;without UE support VC to fully extend hips               PT Short Term Goals - 06/07/17 1102      PT SHORT TERM GOAL #2   Title  pain with squatting, walking, and stairs decreased >/= 25%    Time  4    Period  Weeks    Status  Achieved      PT SHORT TERM GOAL #3  Title  Berg balance score >/= 46/56    Time  4    Period  Weeks    Status  Achieved         PT Long Term Goals - 06/09/17 1101      PT LONG TERM GOAL #1   Title  independent with HEP and understand how to progress himself    Time  8    Period  Weeks    Status  On-going      PT LONG TERM GOAL #2   Title  Berg balance score </=52/56    Baseline  50/56    Time  8    Period  Weeks      PT LONG TERM GOAL #3   Title  inguinal pain with squat, walking, stairs </= 75% due to improved tissue mobility    Time  8    Period  Weeks    Status  On-going      PT LONG TERM GOAL #4   Title  ambulate with improved heel strike instead of shuffling his feet due to improve hip ROM and decreased in pain to decrease risk of falling    Time  8    Period  Weeks            Plan - 06/09/17 1058    Clinical Impression Statement  Patient has increased strength of bilateral hips.  Berg balance is 50/56 with most trouble with tandem stance and single leg stance.  The left inguinal area is puffer than the right . Patient contines to do physical work daily and lifts 50 pound bags of sand.  Patient does not have the sharp pain anymore and it is intermittent.  Patient is learning how to squat with his legs to lift items and breath to reduce straing on surgical repair.  Patient will benefi tfrom skilled therapy to improve strength and balance.     Rehab Potential  Excellent    Clinical Impairments Affecting Rehab Potential  Inguinal hernia repair 12/22/2016; history of cardia problems    PT Frequency  2x / week    PT Duration  8 weeks    PT Treatment/Interventions  Biofeedback;Gait training;Stair training;Therapeutic activities;Therapeutic exercise;Balance training;Patient/family education;Neuromuscular re-education;Manual techniques;Scar mobilization;Dry needling    PT Next Visit Plan  work on balance, erect trunk, soft tissue work, adavnce HEP in next visit.    Consulted and Agree with Plan of Care  Patient       Patient will benefit from skilled therapeutic intervention in order to  improve the following deficits and impairments:  Abnormal gait, Increased fascial restricitons, Pain, Decreased scar mobility, Increased muscle spasms, Decreased strength, Decreased range of motion, Decreased activity tolerance  Visit Diagnosis: Muscle weakness (generalized)  Cramp and spasm  Other abnormalities of gait and mobility  Stiffness of left hip, not elsewhere classified     Problem List Patient Active Problem List   Diagnosis Date Noted  . S/P inguinal hernia repair 12/22/2016  . Coronary artery disease with exertional angina ([place]) 06/16/2016  . Coronary artery disease involving native coronary artery of native heart with angina pectoris ([place]) 06/16/2016  . Benign hypertrophy of prostate 03/04/2014  . HYPERLIPIDEMIA-MIXED 02/17/2010  . HYPERTENSION, UNSPECIFIED 02/17/2010  . CAD, NATIVE VESSEL  (06/16/2016): a. sev sten of mid right coronary art PTCA and DES , severe prox LAD stenosis unsucc PTCA, widely patent left main and left circ 11/14/2009  . FATIGUE 11/12/2009  . ARM NUMBNESS 11/12/2009  . DYSPNEA 11/12/2009  Earlie Counts, PT 06/09/17 11:02 AM   [facility] [address], [place], [place], [address] Phone: [phone]   Fax:  [phone]  Name: JEANPAUL BIEHL MRN: [id number] Date of Birth: 04-Jul-1929

## 2017-06-13 ENCOUNTER — Ambulatory Visit: Payer: Medicare Other | Attending: General Surgery | Admitting: Physical Therapy

## 2017-06-13 ENCOUNTER — Encounter: Payer: Self-pay | Admitting: Physical Therapy

## 2017-06-13 DIAGNOSIS — R2689 Other abnormalities of gait and mobility: Secondary | ICD-10-CM | POA: Insufficient documentation

## 2017-06-13 DIAGNOSIS — M25651 Stiffness of right hip, not elsewhere classified: Secondary | ICD-10-CM | POA: Insufficient documentation

## 2017-06-13 DIAGNOSIS — M25652 Stiffness of left hip, not elsewhere classified: Secondary | ICD-10-CM | POA: Insufficient documentation

## 2017-06-13 DIAGNOSIS — M6281 Muscle weakness (generalized): Secondary | ICD-10-CM | POA: Diagnosis not present

## 2017-06-13 DIAGNOSIS — R252 Cramp and spasm: Secondary | ICD-10-CM

## 2017-06-13 NOTE — Therapy (Signed)
[facility] [address] [place], [place], [address] Phone: [phone]   Fax:  [phone]  Physical Therapy Treatment  Patient Details  Name: Jesus Harmon MRN: [id number] Date of Birth: August 16, 1929 Referring Provider: Dr. Georganna Skeans   Encounter Date: 06/13/2017  PT End of Session - 06/13/17 1001    Visit Number  10    Date for PT Re-Evaluation  07/05/17    Authorization Type  Medicare    PT Start Time  1000    PT Stop Time  1040    PT Time Calculation (min)  40 min    Activity Tolerance  Patient tolerated treatment well;No increased pain    Behavior During Therapy  WFL for tasks assessed/performed       Past Medical History:  Diagnosis Date  . Arthritis    back and neck (06/16/2016)  . CAD (coronary artery disease)    a. LHC 10/2009:  pLAD 50%, small D1 70-80% (too small for PCI), EF 65% => med Rx  b. Myoview 10/2009: EF 65%, no ischemia;  c.  ETT-MV 3/14:  No ischemia, EF 63%  . Cancer [facility])    sin cancer removed nose   . History of kidney stones    "passed them"  . HLD (hyperlipidemia)    "on RX; never had high cholestrol" (06/16/2016)  . HTN (hypertension)    "on RX; never HTN" (06/16/2016)  . Migraine    "in high school" (06/16/2016)    Past Surgical History:  Procedure Laterality Date  . BASAL CELL CARCINOMA EXCISION     "one of my ears; big one on my nose; one on my left shoulder" (06/16/2016)  . CARDIAC CATHETERIZATION  2017   "treated w/RX"  . CATARACT EXTRACTION W/ INTRAOCULAR LENS  IMPLANT, BILATERAL Bilateral   . CORONARY ANGIOPLASTY WITH STENT PLACEMENT  06/16/2016  . CORONARY BALLOON ANGIOPLASTY N/A 06/16/2016   Procedure: Coronary Balloon Angioplasty;  Surgeon: Sherren Mocha, MD;  Location: [place] [facility];  Service: Cardiovascular;  Laterality: N/A;  Prox LAD  . CORONARY STENT INTERVENTION  06/16/2016   Procedure: Coronary Stent Intervention;  Surgeon: Sherren Mocha, MD;  Location: [place] [facility];  Service: Cardiovascular;;  Synergy 4.0x16 to Mid RCA  . INGUINAL HERNIA REPAIR Left 12/22/2016   Procedure: REPAIR OF LEFT INGUINAL HERNIA WITH MESH;  Surgeon: Georganna Skeans, MD;  [facility];  Service: General;  Laterality: Left;  . INSERTION OF MESH Left 12/22/2016   Procedure: INSERTION OF MESH;  Surgeon: Georganna Skeans, MD;  [facility];  Service: General;  Laterality: Left;  . INTRAVASCULAR PRESSURE WIRE/FFR STUDY N/A 06/16/2016   Procedure: Intravascular Pressure Wire/FFR Study;  Surgeon: Sherren Mocha, MD;  Location: [place] [facility];  Service: Cardiovascular;  Laterality: N/A;  Prox LAD  . KNEE ARTHROSCOPY     "not sure which side"  . LEFT HEART CATH AND CORONARY ANGIOGRAPHY N/A 06/16/2016   Procedure: Left Heart Cath and Coronary Angiography;  Surgeon: Sherren Mocha, MD;  Location: [place] [facility];  Service: Cardiovascular;  Laterality: N/A;  . TONSILLECTOMY    . TRANSURETHRAL RESECTION OF PROSTATE N/A 03/04/2014   Procedure: TRANSURETHRAL RESECTION OF THE PROSTATE, TRANSURETHRAL RESECTION OF THE BLADDER TUMOR;  Surgeon: Ailene Rud, MD;  [facility];  Service: Urology;  Laterality: N/A;    There were no vitals filed for this visit.  Subjective Assessment - 06/13/17 1003    Subjective  No new complaints  this morning.     Currently in Pain?  No/denies    Multiple Pain Sites  No                       [facility] Adult PT Treatment/Exercise - 06/13/17 0001      Knee/Hip Exercises: Aerobic   Nustep  L2 x 10 min PTA present       Knee/Hip Exercises: Machines for Strengthening   Cybex Leg Press  55# bil. 3x10; seat #8, incline #3 VC to slow eccentrics      Knee/Hip Exercises: Standing   Hip Flexion  Stengthening;Left;2 sets;10 reps;Knee bent added 2# WTS today     Hip Abduction  AROM;Stengthening;Both;20 reps;Knee straight posture VC    Hip Extension  AROM;Stengthening;Both;20 reps;Knee straight Did one side at at time  for SLS on contralateral.    Functional Squat  -- To green ball hands on hi low table 2x10    Walking with Sports Cord  20# 10x each direction; close CGA on lateral steps               PT Short Term Goals - 06/07/17 1102      PT SHORT TERM GOAL #2   Title  pain with squatting, walking, and stairs decreased >/= 25%    Time  4    Period  Weeks    Status  Achieved      PT SHORT TERM GOAL #3   Title  Berg balance score >/= 46/56    Time  4    Period  Weeks    Status  Achieved        PT Long Term Goals - 06/09/17 1101      PT LONG TERM GOAL #1   Title  independent with HEP and understand how to progress himself    Time  8    Period  Weeks    Status  On-going      PT LONG TERM GOAL #2   Title  Berg balance score </=52/56    Baseline  50/56    Time  8    Period  Weeks      PT LONG TERM GOAL #3   Title  inguinal pain with squat, walking, stairs </= 75% due to improved tissue mobility    Time  8    Period  Weeks    Status  On-going      PT LONG TERM GOAL #4   Title  ambulate with improved heel strike instead of shuffling his feet due to improve hip ROM and decreased in pain to decrease risk of falling    Time  8    Period  Weeks            Plan - 06/13/17 1001    Clinical Impression Statement  When pt puts his LTLE in more neutral position he his not as steady on his feet as he is when he rotates outward from the hips. He needed verbal cuing to walk with starighter leg during resistive walking, he was wobbly with Rt side stepping, pushing off his with his LT. Increased weight on the leg press today, no pain.  Pt reports he does "some " exercises at home.     Rehab Potential  Excellent    Clinical Impairments Affecting Rehab Potential  Inguinal hernia repair 12/22/2016; history of cardia problems    PT Frequency  2x / week    PT Duration  8 weeks  PT Treatment/Interventions  Biofeedback;Gait training;Stair training;Therapeutic activities;Therapeutic  exercise;Balance training;Patient/family education;Neuromuscular re-education;Manual techniques;Scar mobilization;Dry needling    PT Next Visit Plan  work on balance, erect trunk, strength of LTLE.    PT Home Exercise Plan  Access Code: [email or web address]    Consulted and Agree with Plan of Care  Patient       Patient will benefit from skilled therapeutic intervention in order to improve the following deficits and impairments:  Abnormal gait, Increased fascial restricitons, Pain, Decreased scar mobility, Increased muscle spasms, Decreased strength, Decreased range of motion, Decreased activity tolerance  Visit Diagnosis: Muscle weakness (generalized)  Cramp and spasm  Other abnormalities of gait and mobility  Stiffness of left hip, not elsewhere classified  Stiffness of right hip, not elsewhere classified     Problem List Patient Active Problem List   Diagnosis Date Noted  . S/P inguinal hernia repair 12/22/2016  . Coronary artery disease with exertional angina ([place]) 06/16/2016  . Coronary artery disease involving native coronary artery of native heart with angina pectoris ([place]) 06/16/2016  . Benign hypertrophy of prostate 03/04/2014  . HYPERLIPIDEMIA-MIXED 02/17/2010  . HYPERTENSION, UNSPECIFIED 02/17/2010  . CAD, NATIVE VESSEL  (06/16/2016): a. sev sten of mid right coronary art PTCA and DES , severe prox LAD stenosis unsucc PTCA, widely patent left main and left circ 11/14/2009  . FATIGUE 11/12/2009  . ARM NUMBNESS 11/12/2009  . DYSPNEA 11/12/2009    Maddie Brazier, PTA 06/13/2017, 10:46 AM  [facility] [address] [place], [place], [address] Phone: [phone]   Fax:  [phone]  Name: Jesus Harmon MRN: [id number] Date of Birth: [birth year]

## 2017-06-15 ENCOUNTER — Ambulatory Visit: Payer: Medicare Other | Admitting: Physical Therapy

## 2017-06-15 ENCOUNTER — Encounter: Payer: Self-pay | Admitting: Physical Therapy

## 2017-06-15 DIAGNOSIS — R252 Cramp and spasm: Secondary | ICD-10-CM

## 2017-06-15 DIAGNOSIS — M25652 Stiffness of left hip, not elsewhere classified: Secondary | ICD-10-CM

## 2017-06-15 DIAGNOSIS — M6281 Muscle weakness (generalized): Secondary | ICD-10-CM | POA: Diagnosis not present

## 2017-06-15 DIAGNOSIS — M25651 Stiffness of right hip, not elsewhere classified: Secondary | ICD-10-CM

## 2017-06-15 DIAGNOSIS — R2689 Other abnormalities of gait and mobility: Secondary | ICD-10-CM

## 2017-06-15 NOTE — Therapy (Signed)
[facility] [address], [place], [place], [address] Phone: [phone]   Fax:  [phone]  Physical Therapy Treatment  Patient Details  Name: Jesus Harmon MRN: [id number] Date of Birth: Jun 03, 1929 Referring Provider: Dr. Georganna Skeans   Encounter Date: 06/15/2017  Jesus Harmon End of Session - 06/15/17 1027    Visit Number  11    Date for Jesus Harmon Re-Evaluation  07/05/17    Authorization Type  Medicare    Jesus Harmon Start Time  3710    Jesus Harmon Stop Time  1055    Jesus Harmon Time Calculation (min)  40 min    Activity Tolerance  Patient tolerated treatment well;No increased pain    Behavior During Therapy  WFL for tasks assessed/performed       Past Medical History:  Diagnosis Date  . Arthritis    back and neck (06/16/2016)  . CAD (coronary artery disease)    a. LHC 10/2009:  pLAD 50%, small D1 70-80% (too small for PCI), EF 65% => med Rx  b. Myoview 10/2009: EF 65%, no ischemia;  c.  ETT-MV 3/14:  No ischemia, EF 63%  . Cancer [facility])    sin cancer removed nose   . History of kidney stones    "passed them"  . HLD (hyperlipidemia)    "on RX; never had high cholestrol" (06/16/2016)  . HTN (hypertension)    "on RX; never HTN" (06/16/2016)  . Migraine    "in high school" (06/16/2016)    Past Surgical History:  Procedure Laterality Date  . BASAL CELL CARCINOMA EXCISION     "one of my ears; big one on my nose; one on my left shoulder" (06/16/2016)  . CARDIAC CATHETERIZATION  2017   "treated w/RX"  . CATARACT EXTRACTION W/ INTRAOCULAR LENS  IMPLANT, BILATERAL Bilateral   . CORONARY ANGIOPLASTY WITH STENT PLACEMENT  06/16/2016  . CORONARY BALLOON ANGIOPLASTY N/A 06/16/2016   Procedure: Coronary Balloon Angioplasty;  Surgeon: Sherren Mocha, MD;  Location: [place] [facility];  Service: Cardiovascular;  Laterality: N/A;  Prox LAD  . CORONARY STENT INTERVENTION  06/16/2016   Procedure: Coronary Stent Intervention;  Surgeon: Sherren Mocha, MD;  Location: [place] [facility];  Service: Cardiovascular;;  Synergy 4.0x16 to Mid RCA  . INGUINAL HERNIA REPAIR Left 12/22/2016   Procedure: REPAIR OF LEFT INGUINAL HERNIA WITH MESH;  Surgeon: Georganna Skeans, MD;  Location: [facility];  Service: General;  Laterality: Left;  . INSERTION OF MESH Left 12/22/2016   Procedure: INSERTION OF MESH;  Surgeon: Georganna Skeans, MD;  Location: [facility];  Service: General;  Laterality: Left;  . INTRAVASCULAR PRESSURE WIRE/FFR STUDY N/A 06/16/2016   Procedure: Intravascular Pressure Wire/FFR Study;  Surgeon: Sherren Mocha, MD;  Location: [place] [facility];  Service: Cardiovascular;  Laterality: N/A;  Prox LAD  . KNEE ARTHROSCOPY     "not sure which side"  . LEFT HEART CATH AND CORONARY ANGIOGRAPHY N/A 06/16/2016   Procedure: Left Heart Cath and Coronary Angiography;  Surgeon: Sherren Mocha, MD;  Location: [place] [facility];  Service: Cardiovascular;  Laterality: N/A;  . TONSILLECTOMY    . TRANSURETHRAL RESECTION OF PROSTATE N/A 03/04/2014   Procedure: TRANSURETHRAL RESECTION OF THE PROSTATE, TRANSURETHRAL RESECTION OF THE BLADDER TUMOR;  Surgeon: Ailene Rud, MD;  Location: [facility];  Service: Urology;  Laterality: N/A;    There were no vitals filed for this visit.  Subjective Assessment - 06/15/17 1025    Subjective  I do not  have the sharp pain.  When I bend and work the pain will increase.     Patient Stated Goals  reduce pain with activities    Currently in Pain?  Yes    Pain Score  4     Pain Location  Groin    Pain Orientation  Left    Pain Descriptors / Indicators  Dull    Pain Type  Acute pain    Pain Onset  More than a month ago    Pain Frequency  Intermittent    Aggravating Factors   heavy work; bending over    Pain Relieving Factors  sitting    Multiple Pain Sites  No                       [facility] Adult Jesus Harmon Treatment/Exercise - 06/15/17 0001      Knee/Hip Exercises: Aerobic   Nustep  L3 x 10 min; just legs Jesus Harmon discussed progress;  seat #10      Knee/Hip Exercises: Machines for Strengthening   Cybex Leg Press  55# bil. 3x10; seat #8, incline #3 VC to slow eccentrics      Knee/Hip Exercises: Standing   Hip Flexion  Stengthening;Left;2 sets;10 reps;Knee bent added 2#     Hip Abduction  Stengthening;Right;Left;2 sets;10 reps;Knee straight 2# ankle wt.    Hip Extension  Stengthening;Right;Left;2 sets;10 reps;Knee straight 2# ankle wt.    Forward Step Up  Right;Left;10 reps               Jesus Harmon Short Term Goals - 06/07/17 1102      Jesus Harmon SHORT TERM GOAL #2   Title  pain with squatting, walking, and stairs decreased >/= 25%    Time  4    Period  Weeks    Status  Achieved      Jesus Harmon SHORT TERM GOAL #3   Title  Berg balance score >/= 46/56    Time  4    Period  Weeks    Status  Achieved        Jesus Harmon Long Term Goals - 06/09/17 1101      Jesus Harmon LONG TERM GOAL #1   Title  independent with HEP and understand how to progress himself    Time  8    Period  Weeks    Status  On-going      Jesus Harmon LONG TERM GOAL #2   Title  Berg balance score </=52/56    Baseline  50/56    Time  8    Period  Weeks      Jesus Harmon LONG TERM GOAL #3   Title  inguinal pain with squat, walking, stairs </= 75% due to improved tissue mobility    Time  8    Period  Weeks    Status  On-going      Jesus Harmon LONG TERM GOAL #4   Title  ambulate with improved heel strike instead of shuffling his feet due to improve hip ROM and decreased in pain to decrease risk of falling    Time  8    Period  Weeks            Plan - 06/15/17 1027    Clinical Impression Statement  Patient understands he has to take breaks with heavy lifiting and ask people for help. Patient is not having sharp pain just a dull ache when he does too much. Patient had no pain with exercises.  Patient will benefit  from skilled therapy to improve balance and strength.     Rehab Potential  Excellent    Clinical Impairments Affecting Rehab Potential  Inguinal hernia repair 12/22/2016; history  of cardia problems    Jesus Harmon Frequency  2x / week    Jesus Harmon Duration  8 weeks    Jesus Harmon Treatment/Interventions  Biofeedback;Gait training;Stair training;Therapeutic activities;Therapeutic exercise;Balance training;Patient/family education;Neuromuscular re-education;Manual techniques;Scar mobilization;Dry needling    Jesus Harmon Next Visit Plan  work on balance, erect trunk, strength of LTLE. Discharge on second visti    Consulted and Agree with Plan of Care  Patient       Patient will benefit from skilled therapeutic intervention in order to improve the following deficits and impairments:  Abnormal gait, Increased fascial restricitons, Pain, Decreased scar mobility, Increased muscle spasms, Decreased strength, Decreased range of motion, Decreased activity tolerance  Visit Diagnosis: Muscle weakness (generalized)  Cramp and spasm  Other abnormalities of gait and mobility  Stiffness of left hip, not elsewhere classified  Stiffness of right hip, not elsewhere classified     Problem List Patient Active Problem List   Diagnosis Date Noted  . S/P inguinal hernia repair 12/22/2016  . Coronary artery disease with exertional angina ([place]) 06/16/2016  . Coronary artery disease involving native coronary artery of native heart with angina pectoris ([place]) 06/16/2016  . Benign hypertrophy of prostate 03/04/2014  . HYPERLIPIDEMIA-MIXED 02/17/2010  . HYPERTENSION, UNSPECIFIED 02/17/2010  . CAD, NATIVE VESSEL  (06/16/2016): a. sev sten of mid right coronary art PTCA and DES , severe prox LAD stenosis unsucc PTCA, widely patent left main and left circ 11/14/2009  . FATIGUE 11/12/2009  . ARM NUMBNESS 11/12/2009  . DYSPNEA 11/12/2009    Jesus Harmon, Jesus Harmon 06/15/17 10:59 AM   [facility] [address], [place], [place], [address] Phone: [phone]   Fax:  [phone]  Name: Jesus Harmon MRN: [id number] Date of Birth: 02/04/1929

## 2017-06-18 ENCOUNTER — Other Ambulatory Visit: Payer: Self-pay

## 2017-06-18 ENCOUNTER — Encounter (HOSPITAL_COMMUNITY): Payer: Self-pay

## 2017-06-18 ENCOUNTER — Ambulatory Visit (HOSPITAL_COMMUNITY)
Admission: EM | Admit: 2017-06-18 | Discharge: 2017-06-18 | Disposition: A | Discharge status: Home / Self Care | Payer: Medicare Other | Attending: Internal Medicine | Admitting: Internal Medicine

## 2017-06-18 DIAGNOSIS — W269XXA Contact with unspecified sharp object(s), initial encounter: Secondary | ICD-10-CM | POA: Diagnosis not present

## 2017-06-18 DIAGNOSIS — S40812A Abrasion of left upper arm, initial encounter: Secondary | ICD-10-CM | POA: Diagnosis not present

## 2017-06-18 DIAGNOSIS — S40811A Abrasion of right upper arm, initial encounter: Secondary | ICD-10-CM

## 2017-06-18 NOTE — Discharge Instructions (Signed)
Can remove current dressing in 24 hours. Keep wound clean and dry. Daily dressing as needed. Follow up with PCP for reevaluation needed.

## 2017-06-18 NOTE — ED Provider Notes (Signed)
[name]    CSN: [id number] Arrival date & time: 06/18/17  5456     History   Chief Complaint Chief Complaint  Patient presents with  . Laceration    HPI Jesus Harmon is a 84 y.o. male.   84 year old male on Plavix and aspirin 81 mg comes in for continued bleeding of the right arm abrasion that he sustained this morning.  States he got out of vehicle, and cut his arm.  He cleaned the wound, and has arrived in a nonadherent gauze.  However, he has been steadily getting through the gauze.  Denies painful arm movement.      Past Medical History:  Diagnosis Date  . Arthritis    back and neck (06/16/2016)  . CAD (coronary artery disease)    a. LHC 10/2009:  pLAD 50%, small D1 70-80% (too small for PCI), EF 65% => med Rx  b. Myoview 10/2009: EF 65%, no ischemia;  c.  ETT-MV 3/14:  No ischemia, EF 63%  . Cancer [facility])    sin cancer removed nose   . History of kidney stones    "passed them"  . HLD (hyperlipidemia)    "on RX; never had high cholestrol" (06/16/2016)  . HTN (hypertension)    "on RX; never HTN" (06/16/2016)  . Migraine    "in high school" (06/16/2016)    Patient Active Problem List   Diagnosis Date Noted  . S/P inguinal hernia repair 12/22/2016  . Coronary artery disease with exertional angina ([place]) 06/16/2016  . Coronary artery disease involving native coronary artery of native heart with angina pectoris ([place]) 06/16/2016  . Benign hypertrophy of prostate 03/04/2014  . HYPERLIPIDEMIA-MIXED 02/17/2010  . HYPERTENSION, UNSPECIFIED 02/17/2010  . CAD, NATIVE VESSEL  (06/16/2016): a. sev sten of mid right coronary art PTCA and DES , severe prox LAD stenosis unsucc PTCA, widely patent left main and left circ 11/14/2009  . FATIGUE 11/12/2009  . ARM NUMBNESS 11/12/2009  . DYSPNEA 11/12/2009    Past Surgical History:  Procedure Laterality Date  . BASAL CELL CARCINOMA EXCISION     "one of my ears; big one on my nose; one on my left shoulder" (06/16/2016)  .  CARDIAC CATHETERIZATION  2017   "treated w/RX"  . CATARACT EXTRACTION W/ INTRAOCULAR LENS  IMPLANT, BILATERAL Bilateral   . CORONARY ANGIOPLASTY WITH STENT PLACEMENT  06/16/2016  . CORONARY BALLOON ANGIOPLASTY N/A 06/16/2016   Procedure: Coronary Balloon Angioplasty;  Surgeon: Sherren Mocha, MD;  Location: [place] [facility];  Service: Cardiovascular;  Laterality: N/A;  Prox LAD  . CORONARY STENT INTERVENTION  06/16/2016   Procedure: Coronary Stent Intervention;  Surgeon: Sherren Mocha, MD;  Location: [place] [facility];  Service: Cardiovascular;;  Synergy 4.0x16 to Mid RCA  . INGUINAL HERNIA REPAIR Left 12/22/2016   Procedure: REPAIR OF LEFT INGUINAL HERNIA WITH MESH;  Surgeon: Georganna Skeans, MD;  [facility]: [facility];  Service: General;  Laterality: Left;  . INSERTION OF MESH Left 12/22/2016   Procedure: INSERTION OF MESH;  Surgeon: Georganna Skeans, MD;  [facility];  Service: General;  Laterality: Left;  . INTRAVASCULAR PRESSURE WIRE/FFR STUDY N/A 06/16/2016   Procedure: Intravascular Pressure Wire/FFR Study;  Surgeon: Sherren Mocha, MD;  Location: [place] [facility];  Service: Cardiovascular;  Laterality: N/A;  Prox LAD  . KNEE ARTHROSCOPY     "not sure which side"  . LEFT HEART CATH AND CORONARY ANGIOGRAPHY N/A 06/16/2016   Procedure: Left Heart Cath and Coronary Angiography;  Surgeon: Sherren Mocha, MD;  Location: [place] [facility];  Service: Cardiovascular;  Laterality: N/A;  . TONSILLECTOMY    . TRANSURETHRAL RESECTION OF PROSTATE N/A 03/04/2014   Procedure: TRANSURETHRAL RESECTION OF THE PROSTATE, TRANSURETHRAL RESECTION OF THE BLADDER TUMOR;  Surgeon: Ailene Rud, MD;  [facility];  Service: Urology;  Laterality: N/A;       Home Medications    Prior to Admission medications   Medication Sig Start Date End Date Taking? Authorizing Provider  acetaminophen (TYLENOL) 500 MG chewable tablet Chew 1,000 mg by mouth every 6 (six) hours as needed for pain.    Yes  [provider]  aspirin EC 81 MG tablet Take 81 mg by mouth every evening.    Yes [provider]  clopidogrel (PLAVIX) 75 MG tablet Take 1 tablet (75 mg total) by mouth daily with breakfast. 06/17/16  Yes Carlota Raspberry, Tiffany, PA-C  fexofenadine (ALLEGRA) 180 MG tablet Take 180 mg by mouth daily as needed for allergies.    Yes [provider]  guaiFENesin (MUCINEX) 600 MG 12 hr tablet Take by mouth 2 (two) times daily.   Yes [provider]  latanoprost (XALATAN) 0.005 % ophthalmic solution Place 1 drop into both eyes at bedtime.    Yes [provider]  lisinopril (PRINIVIL,ZESTRIL) 10 MG tablet TAKE 1 TABLET BY MOUTH DAILY. 04/05/17  Yes Sherren Mocha, MD  nitroGLYCERIN (NITROSTAT) 0.4 MG SL tablet Place 1 tablet (0.4 mg total) under the tongue every 5 (five) minutes as needed for chest pain. 06/11/16  Yes Sherren Mocha, MD  oxyCODONE (OXY IR/ROXICODONE) 5 MG immediate release tablet Take 5 mg by mouth every 6 (six) hours as needed for moderate pain.  12/08/16  Yes [provider]  oxyCODONE (OXY IR/ROXICODONE) 5 MG immediate release tablet Take 1-2 tablets (5-10 mg total) by mouth every 4 (four) hours as needed for moderate pain. 12/23/16  Yes Georganna Skeans, MD  promethazine-codeine [facility] WITH CODEINE) 6.25-10 MG/5ML syrup Take by mouth every 4 (four) hours as needed for cough.   Yes [provider]  simvastatin (ZOCOR) 20 MG tablet Take 1 tablet (20 mg total) by mouth daily. Patient taking differently: Take 20 mg by mouth every evening.  05/10/16  Yes Sherren Mocha, MD    Family History Family History  Problem Relation Age of Onset  . Sudden death Mother        sudden cardiac arrest  . COPD Father   . Heart failure Father        CHF    Social History Social History   Tobacco Use  . Smoking status: Former Smoker    Packs/day: 1.00    Years: 18.00    Pack years: 18.00    Last attempt to quit: 01/12/1971    Years since  quitting: 46.4  . Smokeless tobacco: Never Used  Substance Use Topics  . Alcohol use: No  . Drug use: No     Allergies   Amoxicillin; Sulfonamide derivatives; and Sulfamethoxazole   Review of Systems Review of Systems  Reason unable to perform ROS: See HPI as above.     Physical Exam Triage Vital Signs ED Triage Vitals  Enc Vitals Group     BP 06/18/17 1846 (!) 129/51     Pulse Rate 06/18/17 1846 75     Resp 06/18/17 1846 14     Temp 06/18/17 1846 98 F (36.7 C)     Temp Source 06/18/17 1846 Oral  SpO2 06/18/17 1846 100 %     Weight --      Height --      Head Circumference --      Peak Flow --      Pain Score 06/18/17 1844 0     Pain Loc --      Pain Edu? --      Excl. in [place]? --    No data found.  Updated Vital Signs BP (!) 129/51   Pulse 75   Temp 98 F (36.7 C) (Oral)   Resp 14   SpO2 100%   Visual Acuity Right Eye Distance:   Left Eye Distance:   Bilateral Distance:    Right Eye Near:   Left Eye Near:    Bilateral Near:     Physical Exam  Constitutional: He is oriented to person, place, and time. He appears well-developed and well-nourished. No distress.  HENT:  Head: Normocephalic and atraumatic.  Eyes: Pupils are equal, round, and reactive to light. Conjunctivae are normal.  Musculoskeletal:  2cm abrasion to the right mid forearm with continued oozing of blood. No swelling.   Neurological: He is alert and oriented to person, place, and time.     UC Treatments / Results  Labs (all labs ordered are listed, but only abnormal results are displayed) Labs Reviewed - No data to display  EKG None  Radiology No results found.  Procedures Procedures (including critical care time)  Medications Ordered in UC Medications - No data to display  Initial Impression / Assessment and Plan / UC Course  I have reviewed the triage vital signs and the nursing notes.  Pertinent labs & imaging results that were available during my care of the  patient were reviewed by me and considered in my medical decision making (see chart for details).    Cleaned wound with Hibiclens.  Surgicel applied to abrasion. Wound dressed with nonadherent gauze.  Can remove dressing in 24 hours.  Return precautions given.  Patient expresses understanding and agrees to plan.  Final Clinical Impressions(s) / UC Diagnoses   Final diagnoses:  Abrasion of left upper extremity, initial encounter   ED Prescriptions    None        Arturo Morton 06/18/17 1921

## 2017-06-18 NOTE — ED Triage Notes (Signed)
Pt presents today with right arm laceration that happened this morning when getting into a vehicle. Cut his arm on the door. He is on blood thinners and can not get the laceration to stop bleeding.

## 2017-06-20 ENCOUNTER — Encounter: Payer: Medicare Other | Admitting: Physical Therapy

## 2017-06-20 DIAGNOSIS — S41119A Laceration without foreign body of unspecified upper arm, initial encounter: Secondary | ICD-10-CM | POA: Diagnosis not present

## 2017-06-22 ENCOUNTER — Encounter: Payer: Self-pay | Admitting: Physical Therapy

## 2017-06-22 ENCOUNTER — Ambulatory Visit: Payer: Medicare Other | Admitting: Physical Therapy

## 2017-06-22 DIAGNOSIS — M25652 Stiffness of left hip, not elsewhere classified: Secondary | ICD-10-CM | POA: Diagnosis not present

## 2017-06-22 DIAGNOSIS — R2689 Other abnormalities of gait and mobility: Secondary | ICD-10-CM | POA: Diagnosis not present

## 2017-06-22 DIAGNOSIS — M25651 Stiffness of right hip, not elsewhere classified: Secondary | ICD-10-CM | POA: Diagnosis not present

## 2017-06-22 DIAGNOSIS — R252 Cramp and spasm: Secondary | ICD-10-CM

## 2017-06-22 DIAGNOSIS — M6281 Muscle weakness (generalized): Secondary | ICD-10-CM

## 2017-06-22 NOTE — Therapy (Signed)
[facility] [address], [place], [place], [address] Phone: [phone]   Fax:  [phone]  Physical Therapy Treatment  Patient Details  Name: Jesus Harmon MRN: [id number] Date of Birth: 12-20-1929 Referring Provider: Dr. Georganna Skeans   Encounter Date: 06/22/2017  PT End of Session - 06/22/17 1057    Visit Number  12    Date for PT Re-Evaluation  07/05/17    Authorization Type  Medicare    PT Start Time  3536    PT Stop Time  1055    PT Time Calculation (min)  40 min    Activity Tolerance  Patient tolerated treatment well;No increased pain    Behavior During Therapy  WFL for tasks assessed/performed       Past Medical History:  Diagnosis Date  . Arthritis    back and neck (06/16/2016)  . CAD (coronary artery disease)    a. LHC 10/2009:  pLAD 50%, small D1 70-80% (too small for PCI), EF 65% => med Rx  b. Myoview 10/2009: EF 65%, no ischemia;  c.  ETT-MV 3/14:  No ischemia, EF 63%  . Cancer [facility])    sin cancer removed nose   . History of kidney stones    "passed them"  . HLD (hyperlipidemia)    "on RX; never had high cholestrol" (06/16/2016)  . HTN (hypertension)    "on RX; never HTN" (06/16/2016)  . Migraine    "in high school" (06/16/2016)    Past Surgical History:  Procedure Laterality Date  . BASAL CELL CARCINOMA EXCISION     "one of my ears; big one on my nose; one on my left shoulder" (06/16/2016)  . CARDIAC CATHETERIZATION  2017   "treated w/RX"  . CATARACT EXTRACTION W/ INTRAOCULAR LENS  IMPLANT, BILATERAL Bilateral   . CORONARY ANGIOPLASTY WITH STENT PLACEMENT  06/16/2016  . CORONARY BALLOON ANGIOPLASTY N/A 06/16/2016   Procedure: Coronary Balloon Angioplasty;  Surgeon: Sherren Mocha, MD;  Location: [place] [facility];  Service: Cardiovascular;  Laterality: N/A;  Prox LAD  . CORONARY STENT INTERVENTION  06/16/2016   Procedure: Coronary Stent Intervention;  Surgeon: Sherren Mocha, MD;  Location: [place] [facility];  Service: Cardiovascular;;  Synergy 4.0x16 to Mid RCA  . INGUINAL HERNIA REPAIR Left 12/22/2016   Procedure: REPAIR OF LEFT INGUINAL HERNIA WITH MESH;  Surgeon: Georganna Skeans, MD;  Location: [facility];  Service: General;  Laterality: Left;  . INSERTION OF MESH Left 12/22/2016   Procedure: INSERTION OF MESH;  Surgeon: Georganna Skeans, MD;  Location: [facility];  Service: General;  Laterality: Left;  . INTRAVASCULAR PRESSURE WIRE/FFR STUDY N/A 06/16/2016   Procedure: Intravascular Pressure Wire/FFR Study;  Surgeon: Sherren Mocha, MD;  Location: [place] [facility];  Service: Cardiovascular;  Laterality: N/A;  Prox LAD  . KNEE ARTHROSCOPY     "not sure which side"  . LEFT HEART CATH AND CORONARY ANGIOGRAPHY N/A 06/16/2016   Procedure: Left Heart Cath and Coronary Angiography;  Surgeon: Sherren Mocha, MD;  Location: [place] [facility];  Service: Cardiovascular;  Laterality: N/A;  . TONSILLECTOMY    . TRANSURETHRAL RESECTION OF PROSTATE N/A 03/04/2014   Procedure: TRANSURETHRAL RESECTION OF THE PROSTATE, TRANSURETHRAL RESECTION OF THE BLADDER TUMOR;  Surgeon: Ailene Rud, MD;  Location: [facility];  Service: Urology;  Laterality: N/A;    There were no vitals filed for this visit.  Subjective Assessment - 06/22/17 1025    Subjective  I am doing  well.  I have cut my arms due to scaping it on a car and door. No sharp pain  Have a soreness. The soreness is 80% better. No pain in the late time of the day unless I am doing something.     Patient Stated Goals  reduce pain with activities    Currently in Pain?  No/denies    Multiple Pain Sites  No         [facility] PT Assessment - 06/22/17 0001      Assessment   Medical Diagnosis  Status post left inguinal hernia repair D66.440    Referring Provider  Dr. Georganna Skeans    Onset Date/Surgical Date  12/22/16    Prior Therapy  None      AROM   Lumbar Flexion  decreased by 25%    Lumbar Extension  decreased by 75%    Lumbar - Right  Side Bend  decreased by 25%    Lumbar - Left Side Bend  decreased by 25%      PROM   Right Hip External Rotation   45    Right Hip Internal Rotation   30    Left Hip Flexion  110    Left Hip External Rotation   30    Left Hip Internal Rotation   20      Strength   Right Hip Flexion  5/5    Right Hip Extension  3-/5    Right Hip ABduction  4/5    Left Hip Flexion  5/5    Left Hip Extension  3-/5    Left Hip ABduction  5/5      Right Hip   Right Hip Extension  0    Right Hip Flexion  110      Palpation   Palpation comment  no tenderness lwith palpation      Berg Balance Test   Sit to Stand  Able to stand without using hands and stabilize independently    Standing Unsupported  Able to stand safely 2 minutes    Sitting with Back Unsupported but Feet Supported on Floor or Stool  Able to sit safely and securely 2 minutes    Stand to Sit  Sits safely with minimal use of hands    Transfers  Able to transfer safely, minor use of hands    Standing Unsupported with Eyes Closed  Able to stand 10 seconds safely    Standing Ubsupported with Feet Together  Able to place feet together independently and stand 1 minute safely    From Standing, Reach Forward with Outstretched Arm  Can reach confidently >25 cm (10")    From Standing Position, Pick up Object from Floor  Able to pick up shoe safely and easily    From Standing Position, Turn to Look Behind Over each Shoulder  Looks behind from both sides and weight shifts well    Turn 360 Degrees  Able to turn 360 degrees safely one side only in 4 seconds or less    Standing Unsupported, Alternately Place Feet on Step/Stool  Able to stand independently and safely and complete 8 steps in 20 seconds    Standing Unsupported, One Foot in Front  Able to plae foot ahead of the other independently and hold 30 seconds    Standing on One Leg  Able to lift leg independently and hold equal to or more than 3 seconds    Total Score  52    Berg comment:  25%  chance of falling                   [facility] Adult PT Treatment/Exercise - 06/22/17 0001      Ambulation/Gait   Gait Comments  ambulating with trunk extension, heel toe gait      Posture/Postural Control   Posture/Postural Control  Postural limitations    Posture Comments  stands with flexed posture, flexed knees      Therapeutic Activites    Therapeutic Activities  Other Therapeutic Activities    Other Therapeutic Activities  standing tall with knee extended and trunk extended holding for 5 seconds      Exercises   Exercises  Other Exercises    Other Exercises   verbally reviewed exercises and patient feels he is able to do them      Knee/Hip Exercises: Aerobic   Nustep  L3 x 10 min; just legs PT discussed progress; seat #10             PT Education - 06/22/17 1057    Education provided  Yes    Education Details  reviewed HEP and posture    Person(s) Educated  Patient    Methods  Explanation;Demonstration    Comprehension  Verbalized understanding       PT Short Term Goals - 06/07/17 1102      PT SHORT TERM GOAL #2   Title  pain with squatting, walking, and stairs decreased >/= 25%    Time  4    Period  Weeks    Status  Achieved      PT SHORT TERM GOAL #3   Title  Berg balance score >/= 46/56    Time  4    Period  Weeks    Status  Achieved        PT Long Term Goals - 06/22/17 1042      PT LONG TERM GOAL #1   Title  independent with HEP and understand how to progress himself    Time  8    Period  Weeks    Status  Achieved      PT LONG TERM GOAL #2   Title  Berg balance score </=52/56    Time  8    Period  Weeks    Status  Achieved      PT LONG TERM GOAL #3   Title  inguinal pain with squat, walking, stairs </= 75% due to improved tissue mobility    Time  8    Period  Weeks    Status  Achieved      PT LONG TERM GOAL #4   Title  ambulate with improved heel strike instead of shuffling his feet due to improve hip ROM and decreased in  pain to decrease risk of falling    Time  8    Period  Weeks    Status  Achieved            Plan - 06/22/17 1057    Clinical Impression Statement  Patient has met all of his goals.  Patient reports no sharp pain.  His pain is 80% better . He gets an intermittent ache in left inguinal area when he lifts and works too much.  Patient understands how to lift correctly and stand upright.  Patient will stand and walk with a flexed posture to liook at the ground to see where he is going.  Patient has increased hip strength but has limited trunk  ROM. Patient is ready for discharge.     Clinical Impairments Affecting Rehab Potential  Inguinal hernia repair 12/22/2016; history of cardia problems    PT Treatment/Interventions  Biofeedback;Gait training;Stair training;Therapeutic activities;Therapeutic exercise;Balance training;Patient/family education;Neuromuscular re-education;Manual techniques;Scar mobilization;Dry needling    PT Next Visit Plan  Discharge to HEP    PT Home Exercise Plan  Current HEP    Consulted and Agree with Plan of Care  Patient       Patient will benefit from skilled therapeutic intervention in order to improve the following deficits and impairments:  Abnormal gait, Increased fascial restricitons, Pain, Decreased scar mobility, Increased muscle spasms, Decreased strength, Decreased range of motion, Decreased activity tolerance  Visit Diagnosis: Muscle weakness (generalized)  Cramp and spasm  Other abnormalities of gait and mobility     Problem List Patient Active Problem List   Diagnosis Date Noted  . S/P inguinal hernia repair 12/22/2016  . Coronary artery disease with exertional angina ([place]) 06/16/2016  . Coronary artery disease involving native coronary artery of native heart with angina pectoris ([place]) 06/16/2016  . Benign hypertrophy of prostate 03/04/2014  . HYPERLIPIDEMIA-MIXED 02/17/2010  . HYPERTENSION, UNSPECIFIED 02/17/2010  . CAD, NATIVE VESSEL   (06/16/2016): a. sev sten of mid right coronary art PTCA and DES , severe prox LAD stenosis unsucc PTCA, widely patent left main and left circ 11/14/2009  . FATIGUE 11/12/2009  . ARM NUMBNESS 11/12/2009  . DYSPNEA 11/12/2009    Earlie Counts, PT 06/22/17 11:04 AM   [facility] [address] [place], [place], [address] Phone: [phone]   Fax:  [phone]  Name: Jesus Harmon MRN: [id number] Date of Birth: 1929/12/16 PHYSICAL THERAPY DISCHARGE SUMMARY  Visits from Start of Care: 12  Current functional level related to goals / functional outcomes: See above.   Remaining deficits: See above.    Education / Equipment: HEP Plan: Patient agrees to discharge.  Patient goals were met. Patient is being discharged due to meeting the stated rehab goals.  Thank you for the referral. Earlie Counts, PT 06/22/17 11:03 AM  ?????

## 2017-06-24 MED FILL — LATANOPROST 0.005% EYE DRP: 0.005 | 25 days supply | Qty: 3 | Fill #1

## 2017-07-04 MED FILL — LISINOPRIL 10 MG TABLET: 10 | 90 days supply | Qty: 90 | Fill #1

## 2017-07-06 ENCOUNTER — Encounter: Payer: Self-pay | Admitting: Cardiovascular Disease

## 2017-07-06 DIAGNOSIS — R2689 Other abnormalities of gait and mobility: Secondary | ICD-10-CM | POA: Diagnosis not present

## 2017-07-06 DIAGNOSIS — I2510 Atherosclerotic heart disease of native coronary artery without angina pectoris: Secondary | ICD-10-CM | POA: Diagnosis not present

## 2017-07-06 DIAGNOSIS — E7800 Pure hypercholesterolemia, unspecified: Secondary | ICD-10-CM | POA: Diagnosis not present

## 2017-07-06 DIAGNOSIS — Z0001 Encounter for general adult medical examination with abnormal findings: Secondary | ICD-10-CM | POA: Diagnosis not present

## 2017-07-08 ENCOUNTER — Telehealth: Payer: Self-pay | Admitting: Cardiovascular Disease

## 2017-07-08 NOTE — Telephone Encounter (Signed)
Spoke with Karen Kitchens at Dr [place] office and pt needs MRI of brain with and without contrast or Ct of brain with and without need to know if pt may MRI since has Drug Eluding Stent. Discussed with Dr Harrington Challenger and pt may have MRI .Karen Kitchens at Dr Mitchell's office notified that pt may have the [place]

## 2017-07-15 ENCOUNTER — Other Ambulatory Visit: Payer: Self-pay | Admitting: *Deleted

## 2017-07-15 MED ORDER — CLOPIDOGREL BISULFATE 75 MG PO TABS: 75.0000 mg | ORAL_TABLET | Freq: Every day | ORAL | 0 refills | Status: DC

## 2017-07-15 MED FILL — CLOPIDOGREL 75 MG TABLET: 75 | 90 days supply | Qty: 90 | Fill #0

## 2017-07-18 MED FILL — LATANOPROST 0.005% EYE DRP: 0.005 | 25 days supply | Qty: 3 | Fill #2

## 2017-07-19 ENCOUNTER — Other Ambulatory Visit: Payer: Self-pay | Admitting: Family Medicine

## 2017-07-19 DIAGNOSIS — R2689 Other abnormalities of gait and mobility: Secondary | ICD-10-CM

## 2017-07-27 ENCOUNTER — Ambulatory Visit
Admission: RE | Admit: 2017-07-27 | Discharge: 2017-07-27 | Disposition: A | Discharge status: Home / Self Care | Payer: Medicare Other | Source: Ambulatory Visit | Attending: Family Medicine | Admitting: Family Medicine

## 2017-07-27 DIAGNOSIS — R2689 Other abnormalities of gait and mobility: Secondary | ICD-10-CM | POA: Diagnosis not present

## 2017-07-27 MED ORDER — GADOBENATE DIMEGLUMINE 529 MG/ML IV SOLN
13.0000 mL | Freq: Once | INTRAVENOUS | Status: AC | PRN
  Administered 2017-07-27: 13 mL via INTRAVENOUS

## 2017-08-22 DIAGNOSIS — D485 Neoplasm of uncertain behavior of skin: Secondary | ICD-10-CM | POA: Diagnosis not present

## 2017-08-22 DIAGNOSIS — Z85828 Personal history of other malignant neoplasm of skin: Secondary | ICD-10-CM | POA: Diagnosis not present

## 2017-08-22 DIAGNOSIS — C44319 Basal cell carcinoma of skin of other parts of face: Secondary | ICD-10-CM | POA: Diagnosis not present

## 2017-08-22 DIAGNOSIS — L723 Sebaceous cyst: Secondary | ICD-10-CM | POA: Diagnosis not present

## 2017-08-22 DIAGNOSIS — Z85820 Personal history of malignant melanoma of skin: Secondary | ICD-10-CM | POA: Diagnosis not present

## 2017-08-29 ENCOUNTER — Telehealth: Payer: Self-pay | Admitting: Cardiovascular Disease

## 2017-08-29 NOTE — Telephone Encounter (Signed)
Spoke with Norraine from [facility] and advised that we just received the clearance for pt today for procedure scheduled 08/31/17, and that we didn't have enough time to plan for pt to hold the plavix 5 days, that procedure will need to be pushed out if they still needed pt to stop. She advised that she would pass that information along to the schedulers and provided them with the call back information.

## 2017-08-29 NOTE — Telephone Encounter (Signed)
Follow Up   [facility] calling back, states patient wants  [facility] to give the recommendation. Dr Sarajane Jews does NOT require medication to be held, but the patient states he is most comfortable with knowing the recommendation from Dr Burt Knack.  Please call Norene directly at [phone] opt. 2

## 2017-08-29 NOTE — Telephone Encounter (Signed)
° °  [facility] Pre-operative Risk Assessment    Request for surgical clearance:  1. What type of surgery is being performed? MOHS Surgery   2. When is this surgery scheduled? 08/31/2017  3. What type of clearance is required (medical clearance vs. Pharmacy clearance to hold med vs. Both)? Pharmacy  4. Are there any medications that need to be held prior to surgery and how long?Plavix and aspirin, leave up his cardiologist  5. Practice name and name of physician performing surgery? [facility], Dr. Griselda Miner   6. What is your office phone number [phone]   7.   What is your office fax number [phone]  8.   Anesthesia type (None, local, MAC, general) ? Local numbing   Maryjane Hurter 08/29/2017, 3:14 PM  _________________________________________________________________   (provider comments below)

## 2017-08-29 NOTE — Telephone Encounter (Signed)
   Primary Cardiologist: Sherren Mocha, MD  Chart reviewed as part of pre-operative protocol coverage.Jesus Harmon was last seen on 11/2016 by Dr. Burt Knack. Prior h/o PCI, last cath treated medically "We discussed consideration of atherectomy and stenting of the LAD, but decided to manage him medically since he had no symptoms". Also HTN, HLD.  We received clearance today for patient to hold Plavix and aspirin in prep for MOHS surgery. This is scheduled for 2 days away. This does not give [place] enough time to assess nor enact holding of blood thinners. Will forward to patient's primary cardiologist for input on holding. Dr Burt Knack - - Please route response to [facility] PREOP (the pre-op pool). Thank you.  Will also ask callback staff to notify surgeon's office to make them aware this is still pending a decision so procedure may need to be delayed if they still require holding of blood thinners. If unable to reach surgeon's office, please call pt to make them aware.  Charlie Pitter, PA-C 08/29/2017, 3:25 PM

## 2017-08-30 NOTE — Telephone Encounter (Signed)
This is really up to the Dermatologist. Typically it's not necessary to hold plavix, but if the patient and family are most comfortable with that, the effects of plavix are minimal at 5 days so I would generally recommend holding x 5 days and continuing ASA without interruption.

## 2017-08-30 NOTE — Telephone Encounter (Signed)
Received call from [facility].  Spoke with Norraine. She wanted to advise to [place] that the pt was the one concerned about the Plavix, not their surgeon, that they don't typically ask to stop any medications, whether its blood thinners or not.  I did advise per Dr. Antionette Char note, he don't typically require it for skin cancer removal, that it was up to the surgeon, that if he felt it necessary, to hold it for 5 days and continue the Aspirin.  Norraine thanked me for the information. Will close this encounter, as they already have the clearance.

## 2017-08-31 DIAGNOSIS — C44319 Basal cell carcinoma of skin of other parts of face: Secondary | ICD-10-CM | POA: Diagnosis not present

## 2017-08-31 DIAGNOSIS — Z85828 Personal history of other malignant neoplasm of skin: Secondary | ICD-10-CM | POA: Diagnosis not present

## 2017-08-31 MED FILL — LATANOPROST 0.005% EYE DRP: 0.005 | 25 days supply | Qty: 3 | Fill #3

## 2017-09-02 MED FILL — SIMVASTATIN 20 MG TABLET: 20 | 90 days supply | Qty: 90 | Fill #1

## 2017-09-05 DIAGNOSIS — R413 Other amnesia: Secondary | ICD-10-CM | POA: Diagnosis not present

## 2017-09-05 DIAGNOSIS — R2689 Other abnormalities of gait and mobility: Secondary | ICD-10-CM | POA: Diagnosis not present

## 2017-09-23 MED FILL — LATANOPROST 0.005% EYE DRP: 0.005 | 25 days supply | Qty: 3 | Fill #0

## 2017-10-03 ENCOUNTER — Other Ambulatory Visit: Payer: Self-pay | Admitting: Cardiovascular Disease

## 2017-10-03 MED FILL — LISINOPRIL 10 MG TABLET: 10 | 90 days supply | Qty: 90 | Fill #0

## 2017-10-03 MED FILL — CLOPIDOGREL 75 MG TABLET: 75 | 90 days supply | Qty: 90 | Fill #0

## 2017-10-04 DIAGNOSIS — H401131 Primary open-angle glaucoma, bilateral, mild stage: Secondary | ICD-10-CM | POA: Diagnosis not present

## 2017-10-12 DIAGNOSIS — R1032 Left lower quadrant pain: Secondary | ICD-10-CM | POA: Diagnosis not present

## 2017-10-26 DIAGNOSIS — Z23 Encounter for immunization: Secondary | ICD-10-CM | POA: Diagnosis not present

## 2017-11-14 MED FILL — LATANOPROST 0.005% EYE DRP: 0.005 | 25 days supply | Qty: 3 | Fill #0

## 2017-12-01 ENCOUNTER — Ambulatory Visit: Payer: Medicare Other | Admitting: Cardiovascular Disease

## 2017-12-01 ENCOUNTER — Encounter: Payer: Self-pay | Admitting: Cardiovascular Disease

## 2017-12-01 VITALS — BP 118/60 | HR 58 | Ht 72.0 in | Wt 146.8 lb

## 2017-12-01 DIAGNOSIS — E782 Mixed hyperlipidemia: Secondary | ICD-10-CM | POA: Diagnosis not present

## 2017-12-01 DIAGNOSIS — I10 Essential (primary) hypertension: Secondary | ICD-10-CM

## 2017-12-01 DIAGNOSIS — I2510 Atherosclerotic heart disease of native coronary artery without angina pectoris: Secondary | ICD-10-CM | POA: Diagnosis not present

## 2017-12-01 MED ORDER — NITROGLYCERIN 0.4 MG SL SUBL: 0.4000 mg | SUBLINGUAL_TABLET | SUBLINGUAL | 5 refills | Status: DC | PRN

## 2017-12-01 MED FILL — NITROGLYCERIN 0.4 MG TAB SL: 0.4 | 20 days supply | Qty: 25 | Fill #0

## 2017-12-01 NOTE — Patient Instructions (Addendum)
Medication Instructions:  1) STOP PLAVIX  Labwork: None  Testing/Procedures: None  Follow-Up: Your provider wants you to follow-up in: 1 year with Dr. Burt Knack. You will receive a reminder letter in the mail two months in advance. If you don't receive a letter, please call our office to schedule the follow-up appointment.    Any Other Special Instructions Will Be Listed Below (If Applicable).

## 2017-12-01 NOTE — Progress Notes (Signed)
Cardiology Office Note:    Date:  12/01/2017   ID:  [name], DOB 09/17/1929, MRN [id number]  PCP:  Aurea Graff.Marlou Sa, MD  Cardiologist:  Sherren Mocha, MD  Electrophysiologist:  None   Referring MD: Aurea Graff.Marlou Sa, MD   Chief Complaint  Patient presents with  . Coronary Artery Disease    History of Present Illness:    Jesus Harmon is a 84 y.o. male with a hx of coronary artery disease.  He developed exertional angina in May 2018 and underwent evaluation with stress testing and echo studies.  There was no ischemia demonstrated, but with typical symptoms cardiac catheterization was recommended.  He was found to have severe 90% stenosis of the right coronary artery treated with PCI using a 4.0 mm drug-eluting stent.  He was noted to have moderate proximal LAD stenosis and FFR was performed and was positive 0.72. PTCA was attempted but a balloon could not be expanded. He has done well with medical therapy since that time.   The patient is here with his daughter today.  He is been doing well.  He does note easy bruising when he continues to be physically active.  He denies chest pain or shortness of breath.  States that he has generalized fatigue but thinks that his age-related.  He lost about 10 pounds unintentionally over the last 18 months.  He has discussed this with his primary care physician.  States that his appetite is still pretty good.  Past Medical History:  Diagnosis Date  . Arthritis    back and neck (06/16/2016)  . CAD (coronary artery disease)    a. LHC 10/2009:  pLAD 50%, small D1 70-80% (too small for PCI), EF 65% => med Rx  b. Myoview 10/2009: EF 65%, no ischemia;  c.  ETT-MV 3/14:  No ischemia, EF 63%  . Cancer [facility])    sin cancer removed nose   . History of kidney stones    "passed them"  . HLD (hyperlipidemia)    "on RX; never had high cholestrol" (06/16/2016)  . HTN (hypertension)    "on RX; never HTN" (06/16/2016)  . Migraine    "in high school"  (06/16/2016)    Past Surgical History:  Procedure Laterality Date  . BASAL CELL CARCINOMA EXCISION     "one of my ears; big one on my nose; one on my left shoulder" (06/16/2016)  . CARDIAC CATHETERIZATION  2017   "treated w/RX"  . CATARACT EXTRACTION W/ INTRAOCULAR LENS  IMPLANT, BILATERAL Bilateral   . CORONARY ANGIOPLASTY WITH STENT PLACEMENT  06/16/2016  . CORONARY BALLOON ANGIOPLASTY N/A 06/16/2016   Procedure: Coronary Balloon Angioplasty;  Surgeon: Sherren Mocha, MD;  Location: [place] [facility];  Service: Cardiovascular;  Laterality: N/A;  Prox LAD  . CORONARY STENT INTERVENTION  06/16/2016   Procedure: Coronary Stent Intervention;  Surgeon: Sherren Mocha, MD;  Location: [place] [facility];  Service: Cardiovascular;;  Synergy 4.0x16 to Mid RCA  . INGUINAL HERNIA REPAIR Left 12/22/2016   Procedure: REPAIR OF LEFT INGUINAL HERNIA WITH MESH;  Surgeon: Georganna Skeans, MD;  Location: [facility];  Service: General;  Laterality: Left;  . INSERTION OF MESH Left 12/22/2016   Procedure: INSERTION OF MESH;  Surgeon: Georganna Skeans, MD;  Location: [facility];  Service: General;  Laterality: Left;  . INTRAVASCULAR PRESSURE WIRE/FFR STUDY N/A 06/16/2016   Procedure: Intravascular Pressure Wire/FFR Study;  Surgeon: Sherren Mocha, MD;  Location: [place] [facility];  Service: Cardiovascular;  Laterality: N/A;  Prox LAD  . KNEE ARTHROSCOPY     "not sure which side"  . LEFT HEART CATH AND CORONARY ANGIOGRAPHY N/A 06/16/2016   Procedure: Left Heart Cath and Coronary Angiography;  Surgeon: Sherren Mocha, MD;  Location: [place] [facility];  Service: Cardiovascular;  Laterality: N/A;  . TONSILLECTOMY    . TRANSURETHRAL RESECTION OF PROSTATE N/A 03/04/2014   Procedure: TRANSURETHRAL RESECTION OF THE PROSTATE, TRANSURETHRAL RESECTION OF THE BLADDER TUMOR;  Surgeon: Ailene Rud, MD;  Location: [facility];  Service: Urology;  Laterality: N/A;    Current Medications: Current Meds  Medication Sig  .  acetaminophen (TYLENOL) 500 MG chewable tablet Chew 1,000 mg by mouth every 6 (six) hours as needed for pain.   Marland Kitchen aspirin EC 81 MG tablet Take 81 mg by mouth every evening.   . clopidogrel (PLAVIX) 75 MG tablet Take 1 tablet (75 mg total) by mouth daily with breakfast. Please make appt with Dr. Burt Knack for November for future refills. 1st attempt  . fexofenadine (ALLEGRA) 180 MG tablet Take 180 mg by mouth daily as needed for allergies.   Marland Kitchen latanoprost (XALATAN) 0.005 % ophthalmic solution Place 1 drop into both eyes at bedtime.   Marland Kitchen lisinopril (PRINIVIL,ZESTRIL) 10 MG tablet Take 1 tablet (10 mg total) by mouth daily. Please make yearly appt with Dr. Burt Knack for November for future refills. 1st attempt  . nitroGLYCERIN (NITROSTAT) 0.4 MG SL tablet Place 1 tablet (0.4 mg total) under the tongue every 5 (five) minutes as needed for chest pain.  . simvastatin (ZOCOR) 20 MG tablet Take 1 tablet (20 mg total) by mouth daily.     Allergies:   Amoxicillin; Sulfonamide derivatives; Sulfa antibiotics; and Sulfamethoxazole   Social History   Socioeconomic History  . Marital status: Married    Spouse name: Not on file  . Number of children: Not on file  . Years of education: Not on file  . Highest education level: Not on file  Occupational History  . Occupation: retired  . Occupation: part time Printmaker  Social Needs  . Financial resource strain: Not on file  . Food insecurity:    Worry: Not on file    Inability: Not on file  . Transportation needs:    Medical: Not on file    Non-medical: Not on file  Tobacco Use  . Smoking status: Former Smoker    Packs/day: 1.00    Years: 18.00    Pack years: 18.00    Last attempt to quit: 01/12/1971    Years since quitting: 46.9  . Smokeless tobacco: Never Used  Substance and Sexual Activity  . Alcohol use: No  . Drug use: No  . Sexual activity: Not on file  Lifestyle  . Physical activity:    Days per week: Not on file    Minutes per session:  Not on file  . Stress: Not on file  Relationships  . Social connections:    Talks on phone: Not on file    Gets together: Not on file    Attends religious service: Not on file    Active member of club or organization: Not on file    Attends meetings of clubs or organizations: Not on file    Relationship status: Not on file  Other Topics Concern  . Not on file  Social History Narrative  . Not on file     Family History: The patient's family history includes COPD in his father; Heart failure in his father;  Sudden death in his mother.  ROS:   Please see the history of present illness.    Positive for weight loss, leg swelling, balance problems.  All other systems reviewed and are negative.  EKGs/Labs/Other Studies Reviewed:    The following studies were reviewed today: Cardiac Cath 06-16-2016: Conclusion   1. Severe stenosis of the mid right coronary artery treated successfully with PTCA and stenting using a 4.0 x 16 mm Synergy DES 2. Severe proximal LAD stenosis confirmed by FFR analysis (0.72) but unsuccessful angioplasty because of inability to expand a balloon in this rigid lesion 3. Widely patent left main and left circumflex 4. Normal LV function by noninvasive assessment  Recommendation: The patient will be observed overnight. Will continue dual antiplatelet therapy with aspirin and clopidogrel for 12 months. Consider atherectomy and PCI of the LAD lesion in a few weeks after reassessment of the patient in the outpatient setting.   Echo 05-20-2016: Study Conclusions  - Left ventricle: The cavity size was normal. There was mild focal   basal hypertrophy of the septum. Systolic function was normal.   Wall motion was normal; there were no regional wall motion   abnormalities. Left ventricular diastolic function parameters   were normal. - Aortic valve: Trileaflet. Moderate focal thickening and   calcification involving the noncoronary cusp. - Mitral valve: There is a  thin very mobile bright target off the   MV that likely represents redundant chordae tendinae. Less likely   to be small ruptured chord with only trivial MR present. There   was trivial regurgitation. - Atrial septum: There was increased thickness of the septum,   consistent with lipomatous hypertrophy. - Pulmonic valve: There was trivial regurgitation.  EKG:  EKG is ordered today.  The ekg ordered today demonstrates sinus bradycardia 58 bpm, otherwise within normal limits.  Recent Labs: 12/17/2016: BUN 17; Potassium 4.4; Sodium 139 12/22/2016: Creatinine, Ser 0.84; Hemoglobin 10.5; Platelets 292  Recent Lipid Panel    Component Value Date/Time   CHOL 153 11/06/2010 1014   TRIG 34.0 11/06/2010 1014   HDL 82.40 11/06/2010 1014   CHOLHDL 2 11/06/2010 1014   VLDL 6.8 11/06/2010 1014   LDLCALC 64 11/06/2010 1014    Physical Exam:    VS:  BP 118/60   Pulse (!) 58   Ht 6' (1.829 m)   Wt 146 lb 12.8 oz (66.6 kg)   SpO2 99%   BMI 19.91 kg/m     Wt Readings from Last 3 Encounters:  12/01/17 146 lb 12.8 oz (66.6 kg)  12/22/16 157 lb (71.2 kg)  12/17/16 157 lb (71.2 kg)     GEN:  Well nourished, well developed elderly male in no acute distress HEENT: Normal NECK: No JVD; No carotid bruits LYMPHATICS: No lymphadenopathy CARDIAC: RRR, no murmurs, rubs, gallops RESPIRATORY:  Clear to auscultation without rales, wheezing or rhonchi  ABDOMEN: Soft, non-tender, non-distended MUSCULOSKELETAL:  No edema; No deformity  SKIN: Warm and dry NEUROLOGIC:  Alert and oriented x 3 PSYCHIATRIC:  Normal affect   ASSESSMENT:    1. Coronary artery disease involving native coronary artery of native heart without angina pectoris   2. Mixed hyperlipidemia   3. Essential hypertension    PLAN:    In order of problems listed above:  1. The patient is stable, now greater than 12 months out from PCI.  Will discontinue clopidogrel today.  He should remain on low-dose aspirin lifelong.  Otherwise  he will continue with secondary risk reduction measures. 2.  The patient is taking simvastatin.  His cholesterol is 162, HDL 93, LDL 60. 3. Blood pressure is well controlled on lisinopril.  Overall Mr. Mella is doing very well.  I will plan to see him back in 1 year unless problems arise in the interim.  He does have moderately severe residual LAD stenosis, demonstrated to be hemodynamically significant by pressure wire analysis at the time of his heart catheterization in 2018.  Since he is having no limitation and no symptoms of angina, will continue with medical treatment.  He understands to watch for his anginal symptoms and if he starts to have problems we could proceed with atherectomy and stenting of the LAD if needed.   Medication Adjustments/Labs and Tests Ordered: Current medicines are reviewed at length with the patient today.  Concerns regarding medicines are outlined above.  No orders of the defined types were placed in this encounter.  No orders of the defined types were placed in this encounter.   There are no Patient Instructions on file for this visit.   Signed, Sherren Mocha, MD  12/01/2017 3:09 PM    [facility]

## 2017-12-09 MED FILL — SIMVASTATIN 20 MG TABLET: 20 | 90 days supply | Qty: 90 | Fill #2

## 2017-12-26 MED FILL — LATANOPROST 0.005% EYE DRP: 0.005 | 25 days supply | Qty: 3 | Fill #0

## 2018-01-05 ENCOUNTER — Other Ambulatory Visit: Payer: Self-pay | Admitting: Cardiovascular Disease

## 2018-01-05 MED FILL — LISINOPRIL 10 MG TABS: 10 | 90 days supply | Qty: 90 | Fill #0

## 2018-01-16 DIAGNOSIS — H401131 Primary open-angle glaucoma, bilateral, mild stage: Secondary | ICD-10-CM | POA: Diagnosis not present

## 2018-01-16 MED FILL — LATANOPROST 0.005% EYE DRP: 0.005 | 25 days supply | Qty: 3 | Fill #0

## 2018-02-01 ENCOUNTER — Ambulatory Visit: Payer: Medicare Other | Admitting: Cardiovascular Disease

## 2018-02-17 ENCOUNTER — Telehealth: Payer: Self-pay | Admitting: Cardiovascular Disease

## 2018-02-17 NOTE — Telephone Encounter (Signed)
Left message for patient's daughter to call back if she would like to discuss her dad's symptoms prior to his visit on Tuesday.

## 2018-02-17 NOTE — Telephone Encounter (Signed)
Pt c/o swelling: STAT is pt has developed SOB within 24 hours  1) How much weight have you gained and in what time span? No weight gain  2) If swelling, where is the swelling located? Calf, going down to his feet.   3) Are you currently taking a fluid pill? no  4) Are you currently SOB? no  5) Do you have a log of your daily weights (if so, list)? no  6) Have you gained 3 pounds in a day or 5 pounds in a week? no  7) Have you traveled recently? no  Daughter did states she measured his calf, left 15, right was 14, and both were swollen. Patients daughter is a Marine scientist.  I did scheduled patient with Kathleen Argue for Tuesday, 2/11, per daughter's request for office visit.

## 2018-02-21 ENCOUNTER — Encounter: Payer: Self-pay | Admitting: Physician Assistant

## 2018-02-21 ENCOUNTER — Ambulatory Visit: Payer: Medicare Other | Admitting: Physician Assistant

## 2018-02-21 VITALS — BP 122/58 | HR 63 | Ht 72.0 in | Wt 150.0 lb

## 2018-02-21 DIAGNOSIS — M7989 Other specified soft tissue disorders: Secondary | ICD-10-CM | POA: Diagnosis not present

## 2018-02-21 DIAGNOSIS — H6123 Impacted cerumen, bilateral: Secondary | ICD-10-CM | POA: Diagnosis not present

## 2018-02-21 DIAGNOSIS — E785 Hyperlipidemia, unspecified: Secondary | ICD-10-CM

## 2018-02-21 DIAGNOSIS — I2510 Atherosclerotic heart disease of native coronary artery without angina pectoris: Secondary | ICD-10-CM

## 2018-02-21 DIAGNOSIS — I10 Essential (primary) hypertension: Secondary | ICD-10-CM

## 2018-02-21 DIAGNOSIS — H903 Sensorineural hearing loss, bilateral: Secondary | ICD-10-CM | POA: Diagnosis not present

## 2018-02-21 MED ORDER — FUROSEMIDE 20 MG PO TABS: ORAL_TABLET | ORAL | 3 refills | Status: DC

## 2018-02-21 MED FILL — FUROSEMIDE 20 MG TABS: 20 | 90 days supply | Qty: 90 | Fill #0

## 2018-02-21 NOTE — Patient Instructions (Signed)
Medication Instructions:  Your physician has recommended you make the following change in your medication:  1. START LASIX 20 MG DAILY FOR 3 DAYS, THEN TAKE AS NEEDED FOR SWELLING.  2. INCREASE YOUR DIETARY POTASSIUM ON THE DAYS YOU TAKE LASIX.   If you need a refill on your cardiac medications before your next appointment, please call your pharmacy.   Lab work: TODAY: BMET, CBC, TSH, LFTS  If you have labs (blood work) drawn today and your tests are completely normal, you will receive your results only by: Marland Kitchen MyChart Message (if you have MyChart) OR . A paper copy in the mail If you have any lab test that is abnormal or we need to change your treatment, we will call you to review the results.  Testing/Procedures: Your physician has requested that you have an echocardiogram. Echocardiography is a painless test that uses sound waves to create images of your heart. It provides your doctor with information about the size and shape of your heart and how well your heart's chambers and valves are working. This procedure takes approximately one hour. There are no restrictions for this procedure.  Follow-Up: At [facility], you and your health needs are our priority.  As part of our continuing mission to provide you with exceptional heart care, we have created designated Provider Care Teams.  These Care Teams include your primary Cardiologist (physician) and Advanced Practice Providers (APPs -  Physician Assistants and Nurse Practitioners) who all work together to provide you with the care you need, when you need it. . You will need a follow up appointment in:  2-3 WEEKS WITH  Jesus Mocha, MD or Jesus Dopp, PA-C  Any Other Special Instructions Will Be Listed Below (If Applicable).

## 2018-02-21 NOTE — Progress Notes (Signed)
Cardiology Office Note:    Date:  02/21/2018   ID:  [name], DOB 1929/07/17, MRN [id number]  PCP:  Aurea Graff.Marlou Sa, MD  Cardiologist:  Sherren Mocha, MD   Electrophysiologist:  None   Referring MD: Aurea Graff.Marlou Sa, MD   Chief Complaint  Patient presents with  . Leg Swelling     History of Present Illness:    Jesus Harmon is a 85 y.o. male with coronary artery disease s/p DES to RCA in 2018.  Jesus Harmon has residual disease with moderate proximal LAD stenosis that was hemodynamically significant at the time of his Cardiac Catheterization.  Angioplasty was attempted but a balloon could not be expanded.  Jesus Harmon has been managed medically.  If Jesus Harmon has refractory angina, atherectomy and stenting of the LAD can be attempted.  Jesus Harmon was last seen by Dr. Burt Knack in 11/2017.    Jesus Harmon returns for evaluation of leg swelling.  Jesus Harmon is here with his daughter.  Since last summer, Jesus Harmon has noticed some leg swelling. However, over the past 2 weeks, it has gotten worse.  It does not improve with elevation.  Jesus Harmon has not had any injuries. Jesus Harmon has not had any constipation, diarrhea, skin changes, melena, hematochezia, hematuria, weight changes.  Jesus Harmon denies shortness of breath, chest pain, syncope, paroxysmal nocturnal dyspnea.    Prior CV studies:   The following studies were reviewed today:  Cardiac Catheterization 06/16/16 LAD prox 70 RCA mid 90 1. Severe stenosis of the mid right coronary artery treated successfully with PTCA and stenting using a 4.0 x 16 mm Synergy DES 2. Severe proximal LAD stenosis confirmed by FFR analysis (0.72) but unsuccessful angioplasty because of inability to expand a balloon in this rigid lesion 3. Widely patent left main and left circumflex 4. Normal LV function by noninvasive assessment  Myoview 05/20/16  Nuclear stress EF: 60%. No wall motion abnormalities noted.  There was no ST segment deviation noted during stress.  Defect 1: There is a small defect of moderate  severity present in the basal inferoseptal location.  The left ventricular ejection fraction is normal (55-65%).  This is a low risk study There is no ischemia in the LAD distribution or other territories.  Echo 05/20/16 Mild focal basal septal hypertrophy, normal LVF, normal wall motion, AoV calcification, trivial MR, probably redundant chordae, atrial septal lipomatous hypertrophy, trivial PI   Past Medical History:  Diagnosis Date  . Arthritis    back and neck (06/16/2016)  . CAD (coronary artery disease)    a. LHC 10/2009:  pLAD 50%, small D1 70-80% (too small for PCI), EF 65% => med Rx  b. Myoview 10/2009: EF 65%, no ischemia;  c.  ETT-MV 3/14:  No ischemia, EF 63%  . Cancer [facility])    sin cancer removed nose   . History of kidney stones    "passed them"  . HLD (hyperlipidemia)    "on RX; never had high cholestrol" (06/16/2016)  . HTN (hypertension)    "on RX; never HTN" (06/16/2016)  . Migraine    "in high school" (06/16/2016)   Surgical Hx: The patient  has a past surgical history that includes Knee arthroscopy; Transurethral resection of prostate (N/A, 03/04/2014); Tonsillectomy; Excision basal cell carcinoma; Cataract extraction w/ intraocular lens  implant, bilateral (Bilateral); Coronary angioplasty with stent (06/16/2016); Cardiac catheterization (2017); LEFT HEART CATH AND CORONARY ANGIOGRAPHY (N/A, 06/16/2016); CORONARY STENT INTERVENTION (06/16/2016); INTRAVASCULAR PRESSURE WIRE/FFR STUDY (N/A, 06/16/2016); CORONARY BALLOON ANGIOPLASTY (N/A, 06/16/2016); Inguinal hernia repair (Left, 12/22/2016);  and Insertion of mesh (Left, 12/22/2016).   Current Medications: Current Meds  Medication Sig  . acetaminophen (TYLENOL) 500 MG chewable tablet Chew 1,000 mg by mouth every 6 (six) hours as needed for pain.   Marland Kitchen aspirin EC 81 MG tablet Take 81 mg by mouth every evening.   . cetirizine (ZYRTEC) 10 MG tablet Take 10 mg by mouth daily.  Marland Kitchen latanoprost (XALATAN) 0.005 % ophthalmic solution Place 1  drop into both eyes at bedtime.   Marland Kitchen lisinopril (PRINIVIL,ZESTRIL) 10 MG tablet TAKE 1 TABLET BY MOUTH DAILY.  . nitroGLYCERIN (NITROSTAT) 0.4 MG SL tablet Place 1 tablet (0.4 mg total) under the tongue every 5 (five) minutes as needed for chest pain.  . simvastatin (ZOCOR) 20 MG tablet Take 1 tablet (20 mg total) by mouth daily.     Allergies:   Amoxicillin; Sulfonamide derivatives; Sulfa antibiotics; and Sulfamethoxazole   Social History   Tobacco Use  . Smoking status: Former Smoker    Packs/day: 1.00    Years: 18.00    Pack years: 18.00    Last attempt to quit: 01/12/1971    Years since quitting: 47.1  . Smokeless tobacco: Never Used  Substance Use Topics  . Alcohol use: No  . Drug use: No     Family Hx: The patient's family history includes COPD in his father; Heart failure in his father; Sudden death in his mother.  ROS:   Please see the history of present illness.    Review of Systems  Cardiovascular: Positive for leg swelling.   All other systems reviewed and are negative.   EKGs/Labs/Other Test Reviewed:    EKG:  EKG is  ordered today.  The ekg ordered today demonstrates normal sinus rhythm, HR 63, leftward axis, QTc 392, no significant changes.   Recent Labs: No results found for requested labs within last 8760 hours.   Recent Lipid Panel Lab Results  Component Value Date/Time   CHOL 153 11/06/2010 10:14 AM   TRIG 34.0 11/06/2010 10:14 AM   HDL 82.40 11/06/2010 10:14 AM   CHOLHDL 2 11/06/2010 10:14 AM   LDLCALC 64 11/06/2010 10:14 AM   From [employer] Cholesterol, total 162.000 07/06/2017 HDL 93.000 07/06/2017 LDL 60.000 07/06/2017 Triglycerides 47.000 07/06/2017 Hemoglobin 11.200 07/06/2017 Creatinine, Serum 0.980 07/06/2017 Potassium 4.900 07/06/2017 ALT (SGPT) 16.000 07/06/2017 TSH 3.210 07/06/2017 INR 0.900 06/14/2016 Platelets 292.000 12/22/2016  Physical Exam:    VS:  BP (!) 122/58   Pulse 63   Ht 6' (1.829 m)   Wt 150 lb (68 kg)   SpO2 96%   BMI  20.34 kg/m     Wt Readings from Last 3 Encounters:  02/21/18 150 lb (68 kg)  12/01/17 146 lb 12.8 oz (66.6 kg)  12/22/16 157 lb (71.2 kg)     Physical Exam  Constitutional: Jesus Harmon is oriented to person, place, and time. Jesus Harmon appears well-developed and well-nourished. No distress.  HENT:  Head: Normocephalic and atraumatic.  Eyes: No scleral icterus.  Neck: No JVD present. No thyromegaly present.  Cardiovascular: Normal rate and regular rhythm.  No murmur heard. Pulmonary/Chest: Effort normal. Jesus Harmon has no wheezes. Jesus Harmon has no rales.  Abdominal: Soft. Jesus Harmon exhibits no distension.  Musculoskeletal:        General: Edema (1-2+ bilat LE edema; multiple varicose veins noted bilat) present.     Comments: No presacral edema  Lymphadenopathy:    Jesus Harmon has no cervical adenopathy.  Neurological: Jesus Harmon is alert and oriented to person, place, and time.  Skin: Skin is warm and dry.  Psychiatric: Jesus Harmon has a normal mood and affect.    ASSESSMENT & PLAN:    Leg swelling  Edema is bilateral.  His neck veins are flat and lungs are clear.  Jesus Harmon is not short of breath.  I do not suspect congestive heart failure as a culprit.  Jesus Harmon does have a lot of varicose veins on exam.  I suspect this may all be due to venous insufficiency.  However, his swelling is fairly new. I will obtain labs and an echo to rule out other causes.  I will also give him Lasix to use as needed.   -Lasix 20 mg QD x 3 days, then prn  -Jesus Harmon will call if Jesus Harmon has to take often to get a follow up BMET  -Increase dietary K+ on Lasix days  -BMET, LFTs, TSH, CBC  -Obtain Echo   -FU with me or Dr. Burt Knack in 2-3 weeks  Coronary artery disease involving native coronary artery of native heart without angina pectoris  S/p DES to RCA in 2018.  There is residual LAD stenosis that is tx medically.  Jesus Harmon denies angina.  Continue ASA, statin.  Essential hypertension The patient's blood pressure is controlled on his current regimen.  Continue current therapy.     Hyperlipidemia, unspecified hyperlipidemia type LDL optimal on most recent lab work.  Continue current Rx.     Dispo:  Return in about 3 weeks (around 03/14/2018) for Routine Follow Up, w/ Dr. Burt Knack, or Richardson Dopp, PA-C.   Medication Adjustments/Labs and Tests Ordered: Current medicines are reviewed at length with the patient today.  Concerns regarding medicines are outlined above. 3 Tests Ordered: Orders Placed This Encounter  Procedures  . Basic metabolic panel  . CBC  . TSH  . Hepatic function panel  . EKG 12-Lead  . ECHOCARDIOGRAM COMPLETE   Medication Changes: Meds ordered this encounter  Medications  . furosemide (LASIX) 20 MG tablet    Sig: Take 20 mg daily for 3 days, then as needed for swelling.    Dispense:  90 tablet    Refill:  3    Signed, Richardson Dopp, PA-C  02/21/2018 4:03 PM    [facility] [address], [place], [place]  [address] Phone: [phone]; Fax: [phone]

## 2018-02-22 DIAGNOSIS — C44319 Basal cell carcinoma of skin of other parts of face: Secondary | ICD-10-CM | POA: Diagnosis not present

## 2018-02-22 DIAGNOSIS — L723 Sebaceous cyst: Secondary | ICD-10-CM | POA: Diagnosis not present

## 2018-02-22 DIAGNOSIS — D485 Neoplasm of uncertain behavior of skin: Secondary | ICD-10-CM | POA: Diagnosis not present

## 2018-02-22 DIAGNOSIS — L821 Other seborrheic keratosis: Secondary | ICD-10-CM | POA: Diagnosis not present

## 2018-02-22 DIAGNOSIS — Z85828 Personal history of other malignant neoplasm of skin: Secondary | ICD-10-CM | POA: Diagnosis not present

## 2018-02-22 LAB — BASIC METABOLIC PANEL
BUN/Creatinine Ratio: 17 (ref 10–24)
BUN: 17 mg/dL (ref 8–27)
CALCIUM: 9.6 mg/dL (ref 8.6–10.2)
CHLORIDE: 103 mmol/L (ref 96–106)
CO2: 27 mmol/L (ref 20–29)
Creatinine, Ser: 1 mg/dL (ref 0.76–1.27)
GFR calc Af Amer: 78 mL/min/{1.73_m2} (ref >59)
GFR calc non Af Amer: 68 mL/min/{1.73_m2} (ref >59)
Glucose: 102 mg/dL — ABNORMAL HIGH (ref 65–99)
POTASSIUM: 5 mmol/L (ref 3.5–5.2)
Sodium: 141 mmol/L (ref 134–144)

## 2018-02-22 LAB — CBC
HEMATOCRIT: 35.4 % — AB (ref 37.5–51.0)
HEMOGLOBIN: 11.1 g/dL — AB (ref 13.0–17.7)
MCH: 29.1 pg (ref 26.6–33.0)
MCHC: 31.4 g/dL — ABNORMAL LOW (ref 31.5–35.7)
MCV: 93 fL (ref 79–97)
Platelets: 249 10*3/uL (ref 150–450)
RBC: 3.82 x10E6/uL — AB (ref 4.14–5.80)
RDW: 12.3 % (ref 11.6–15.4)
WBC: 7.1 10*3/uL (ref 3.4–10.8)

## 2018-02-22 LAB — HEPATIC FUNCTION PANEL
ALBUMIN: 4.3 g/dL (ref 3.6–4.6)
ALK PHOS: 59 IU/L (ref 39–117)
ALT: 15 IU/L (ref 0–44)
AST: 21 IU/L (ref 0–40)
BILIRUBIN TOTAL: 0.3 mg/dL (ref 0.0–1.2)
BILIRUBIN, DIRECT: 0.11 mg/dL (ref 0.00–0.40)
TOTAL PROTEIN: 6.9 g/dL (ref 6.0–8.5)

## 2018-02-22 LAB — TSH: TSH: 3.2 u[IU]/mL (ref 0.450–4.500)

## 2018-02-24 ENCOUNTER — Telehealth: Payer: Self-pay

## 2018-02-24 ENCOUNTER — Ambulatory Visit (HOSPITAL_COMMUNITY): Payer: Medicare Other | Attending: Cardiology

## 2018-02-24 ENCOUNTER — Encounter: Payer: Self-pay | Admitting: Physician Assistant

## 2018-02-24 DIAGNOSIS — I2510 Atherosclerotic heart disease of native coronary artery without angina pectoris: Secondary | ICD-10-CM | POA: Diagnosis not present

## 2018-02-24 DIAGNOSIS — M7989 Other specified soft tissue disorders: Secondary | ICD-10-CM

## 2018-02-24 NOTE — Telephone Encounter (Signed)
Patient returned call

## 2018-02-24 NOTE — Telephone Encounter (Signed)
Notes recorded by Frederik Schmidt, RN on 02/24/2018 at 4:22 PM EST The patient has been notified of the result and verbalized understanding. All questions (if any) were answered. Frederik Schmidt, RN 02/24/2018 4:21 PM

## 2018-02-24 NOTE — Telephone Encounter (Signed)
-----   Message from Liliane Shi, [place] sent at 02/24/2018  2:24 PM EST ----- Please call the patient. His echocardiogram shows normal heart function and no significant valvular disease.  Recommendations:  - Continue current medications and follow up as planned.  Richardson Dopp, PA-C    02/24/2018 2:22 PM

## 2018-02-24 NOTE — Telephone Encounter (Signed)
Notes recorded by Frederik Schmidt, RN on 02/24/2018 at 2:35 PM EST No answer on home phone and VM not set up on mobile phone. 2/14 ------

## 2018-03-06 MED FILL — LATANOPROST 0.005% EYE DRP: 0.005 | 25 days supply | Qty: 3 | Fill #1

## 2018-03-06 MED FILL — SIMVASTATIN 20 MG TABLET: 20 | 90 days supply | Qty: 90 | Fill #3

## 2018-03-17 ENCOUNTER — Encounter: Payer: Self-pay | Admitting: Cardiovascular Disease

## 2018-03-17 ENCOUNTER — Ambulatory Visit: Payer: Medicare Other | Admitting: Cardiovascular Disease

## 2018-03-17 VITALS — BP 150/74 | HR 57 | Ht 72.0 in | Wt 149.0 lb

## 2018-03-17 DIAGNOSIS — I2510 Atherosclerotic heart disease of native coronary artery without angina pectoris: Secondary | ICD-10-CM | POA: Diagnosis not present

## 2018-03-17 DIAGNOSIS — M7989 Other specified soft tissue disorders: Secondary | ICD-10-CM | POA: Diagnosis not present

## 2018-03-17 NOTE — Patient Instructions (Signed)
Medication Instructions:  Your provider recommends that you continue on your current medications as directed. Please refer to the Current Medication list given to you today.    Labwork: None  Testing/Procedures: None  Follow-Up: Your provider wants you to follow-up in: 1 year with Dr. Burt Knack. You will receive a reminder letter in the mail two months in advance. If you don't receive a letter, please call our office to schedule the follow-up appointment.     You have been given a prescription for compression stocking.   For your  leg edema you  should do  the following 1. Leg elevation - I recommend the Lounge Dr. Leg rest.  See below for details  2. Salt restriction  -  Use potassium chloride instead of regular salt as a salt substitute. 3. Walk regularly 4. Compression hose - [facility] 5. Weight loss    Available on [email or web address] Or  Go to [email or web address]

## 2018-03-17 NOTE — Progress Notes (Signed)
Cardiology Office Note:    Date:  03/17/2018   ID:  Jesus Harmon, DOB November 23, 1929, MRN [id number]  PCP:  Jesus Graff.Marlou Sa, MD  Cardiologist:  Sherren Mocha, MD  Electrophysiologist:  None   Referring MD: Jesus Graff.Marlou Sa, MD   Chief Complaint  Patient presents with  . Leg Swelling    History of Present Illness:    Jesus Harmon is a 85 y.o. male with a hx of coronary artery disease and lower extremity edema, presenting for follow-up evaluation.  The patient was recently seen by Richardson Dopp for leg swelling.  He underwent an echo study which showed no significant cardiac abnormality.  He has not really been consistent with leg elevation.  He has taken a short course of diuretics and noted minimal improvement.  He complains of varicose veins in both lower legs.  He does not have leg pain.  He has no claudication symptoms.  He denies chest pain, shortness of breath, or orthopnea.  He has no arm or abdominal swelling.He denies fevers, chills, or constitutional symptoms.  Past Medical History:  Diagnosis Date  . Arthritis    back and neck (06/16/2016)  . CAD (coronary artery disease)    a. LHC 10/2009:  pLAD 50%, small D1 70-80% (too small for PCI), EF 65% => med Rx  b. Myoview 10/2009: EF 65%, no ischemia;  c.  ETT-MV 3/14:  No ischemia, EF 63%  . Cancer [facility])    sin cancer removed nose   . History of echocardiogram    Echo 02/2018: EF 83-66, +diastolic dysfunction, no RWMA, normal RVSF, trivial MR  . History of kidney stones    "passed them"  . HLD (hyperlipidemia)    "on RX; never had high cholestrol" (06/16/2016)  . HTN (hypertension)    "on RX; never HTN" (06/16/2016)  . Migraine    "in high school" (06/16/2016)    Past Surgical History:  Procedure Laterality Date  . BASAL CELL CARCINOMA EXCISION     "one of my ears; big one on my nose; one on my left shoulder" (06/16/2016)  . CARDIAC CATHETERIZATION  2017   "treated w/RX"  . CATARACT EXTRACTION W/ INTRAOCULAR LENS  IMPLANT,  BILATERAL Bilateral   . CORONARY ANGIOPLASTY WITH STENT PLACEMENT  06/16/2016  . CORONARY BALLOON ANGIOPLASTY N/A 06/16/2016   Procedure: Coronary Balloon Angioplasty;  Surgeon: Sherren Mocha, MD;  Location: [place] CV LAB;  Service: Cardiovascular;  Laterality: N/A;  Prox LAD  . CORONARY STENT INTERVENTION  06/16/2016   Procedure: Coronary Stent Intervention;  Surgeon: Sherren Mocha, MD;  Location: [place] CV LAB;  Service: Cardiovascular;;  Synergy 4.0x16 to Mid RCA  . INGUINAL HERNIA REPAIR Left 12/22/2016   Procedure: REPAIR OF LEFT INGUINAL HERNIA WITH MESH;  Surgeon: Georganna Skeans, MD;  Location: [facility];  Service: General;  Laterality: Left;  . INSERTION OF MESH Left 12/22/2016   Procedure: INSERTION OF MESH;  Surgeon: Georganna Skeans, MD;  Location: [facility];  Service: General;  Laterality: Left;  . INTRAVASCULAR PRESSURE WIRE/FFR STUDY N/A 06/16/2016   Procedure: Intravascular Pressure Wire/FFR Study;  Surgeon: Sherren Mocha, MD;  Location: [place] [facility];  Service: Cardiovascular;  Laterality: N/A;  Prox LAD  . KNEE ARTHROSCOPY     "not sure which side"  . LEFT HEART CATH AND CORONARY ANGIOGRAPHY N/A 06/16/2016   Procedure: Left Heart Cath and Coronary Angiography;  Surgeon: Sherren Mocha, MD;  Location: [place] CV LAB;  Service: Cardiovascular;  Laterality: N/A;  .  TONSILLECTOMY    . TRANSURETHRAL RESECTION OF PROSTATE N/A 03/04/2014   Procedure: TRANSURETHRAL RESECTION OF THE PROSTATE, TRANSURETHRAL RESECTION OF THE BLADDER TUMOR;  Surgeon: Ailene Rud, MD;  Location: [facility];  Service: Urology;  Laterality: N/A;    Current Medications: Current Meds  Medication Sig  . aspirin EC 81 MG tablet Take 81 mg by mouth every evening.   . cetirizine (ZYRTEC) 10 MG tablet Take 10 mg by mouth daily.  . furosemide (LASIX) 20 MG tablet Take 20 mg daily for 3 days, then as needed for swelling.  . latanoprost (XALATAN) 0.005 % ophthalmic solution Place 1 drop into both  eyes at bedtime.   Marland Kitchen lisinopril (PRINIVIL,ZESTRIL) 10 MG tablet TAKE 1 TABLET BY MOUTH DAILY.  . nitroGLYCERIN (NITROSTAT) 0.4 MG SL tablet Place 1 tablet (0.4 mg total) under the tongue every 5 (five) minutes as needed for chest pain.  . simvastatin (ZOCOR) 20 MG tablet Take 1 tablet (20 mg total) by mouth daily.     Allergies:   Amoxicillin; Sulfonamide derivatives; Sulfa antibiotics; and Sulfamethoxazole   Social History   Socioeconomic History  . Marital status: Married    Spouse name: Not on file  . Number of children: Not on file  . Years of education: Not on file  . Highest education level: Not on file  Occupational History  . Occupation: retired  . Occupation: part time Printmaker  Social Needs  . Financial resource strain: Not on file  . Food insecurity:    Worry: Not on file    Inability: Not on file  . Transportation needs:    Medical: Not on file    Non-medical: Not on file  Tobacco Use  . Smoking status: Former Smoker    Packs/day: 1.00    Years: 18.00    Pack years: 18.00    Last attempt to quit: 01/12/1971    Years since quitting: 47.2  . Smokeless tobacco: Never Used  Substance and Sexual Activity  . Alcohol use: No  . Drug use: No  . Sexual activity: Not on file  Lifestyle  . Physical activity:    Days per week: Not on file    Minutes per session: Not on file  . Stress: Not on file  Relationships  . Social connections:    Talks on phone: Not on file    Gets together: Not on file    Attends religious service: Not on file    Active member of club or organization: Not on file    Attends meetings of clubs or organizations: Not on file    Relationship status: Not on file  Other Topics Concern  . Not on file  Social History Narrative  . Not on file     Family History: The patient's family history includes COPD in his father; Heart failure in his father; Sudden death in his mother.  ROS:   Please see the history of present illness.    All  other systems reviewed and are negative.  EKGs/Labs/Other Studies Reviewed:    The following studies were reviewed today: 2D Echo:    1. The left ventricle has normal systolic function, with an ejection fraction of 55-60%. The cavity size was normal. Left ventricular diastolic Doppler parameters are consistent with pseudonormalization No evidence of left ventricular regional wall  motion abnormalities.  2. The right ventricle has normal systolic function. The cavity was normal. There is no increase in right ventricular wall thickness.  3. The mitral  valve is normal in structure. No evidence of mitral valve stenosis. Trivial regurgitation.  4. The tricuspid valve is normal in structure.  5. The aortic valve is tricuspid Mild calcification of the aortic valve. no stenosis of the aortic valve.  6. The pulmonic valve was normal in structure.  7. The inferior vena cava was dilated in size with >50% respiratory variability.  8. Right atrial pressure is estimated at 8 mmHg.  9. PA systolic pressure 34 mmHg.  FINDINGS  Left Ventricle: The left ventricle has normal systolic function, with an ejection fraction of 55-60%. The cavity size was normal. There is no increase in left ventricular wall thickness. Left ventricular diastolic Doppler parameters are consistent with  pseudonormalization No evidence of left ventricular regional wall motion abnormalities.. Right Ventricle: The right ventricle has normal systolic function. The cavity was normal. There is no increase in right ventricular wall thickness. Left Atrium: left atrial size was normal in size Right Atrium: right atrial size was normal in size Right atrial pressure is estimated at 8 mmHg. Interatrial Septum: No atrial level shunt detected by color flow Doppler. Pericardium: There is no evidence of pericardial effusion. Mitral Valve: The mitral valve is normal in structure. Mitral valve regurgitation is trivial by color flow Doppler. No  evidence of mitral valve stenosis. Tricuspid Valve: The tricuspid valve is normal in structure. Tricuspid valve regurgitation is trivial by color flow Doppler. Aortic Valve: The aortic valve is tricuspid Mild calcification of the aortic valve Aortic valve regurgitation was not visualized by color flow Doppler. There is no stenosis of the aortic valve. Pulmonic Valve: The pulmonic valve was normal in structure. Pulmonic valve regurgitation is not visualized by color flow Doppler. Venous: The inferior vena cava is dilated in size with greater than 50% respiratory variability.  EKG:  EKG is not ordered today.    Recent Labs: 02/21/2018: ALT 15; BUN 17; Creatinine, Ser 1.00; Hemoglobin 11.1; Platelets 249; Potassium 5.0; Sodium 141; TSH 3.200  Recent Lipid Panel    Component Value Date/Time   CHOL 153 11/06/2010 1014   TRIG 34.0 11/06/2010 1014   HDL 82.40 11/06/2010 1014   CHOLHDL 2 11/06/2010 1014   VLDL 6.8 11/06/2010 1014   LDLCALC 64 11/06/2010 1014    Physical Exam:    VS:  BP (!) 150/74   Pulse (!) 57   Ht 6' (1.829 m)   Wt 149 lb (67.6 kg)   SpO2 97%   BMI 20.21 kg/m     Wt Readings from Last 3 Encounters:  03/17/18 149 lb (67.6 kg)  02/21/18 150 lb (68 kg)  12/01/17 146 lb 12.8 oz (66.6 kg)     GEN:  Well nourished, well developed pleasant elderly male in no acute distress HEENT: Normal NECK: No JVD; No carotid bruits LYMPHATICS: No lymphadenopathy CARDIAC: RRR, no murmurs, rubs, gallops RESPIRATORY:  Clear to auscultation without rales, wheezing or rhonchi  ABDOMEN: Soft, non-tender, non-distended MUSCULOSKELETAL: 2+ bilateral pretibial edema; No deformity  SKIN: Warm and dry NEUROLOGIC:  Alert and oriented x 3 PSYCHIATRIC:  Normal affect   ASSESSMENT:    1. Coronary artery disease involving native coronary artery of native heart without angina pectoris   2. Leg swelling    PLAN:    In order of problems listed above:  1. The patient's coronary disease is  stable.  He continues on aspirin and a statin drug.  He has no symptoms of angina. 2. I think the patient has venous insufficiency.  I reviewed  his medical program and he is not on any medicines that cause swelling.  His lab work is essentially unremarkable.  We discussed compression and I prescribed him compression stockings.  I also gave him information on the Lounge Doctor device and demonstrated proper technique for leg elevation.  I offered him a referral to vascular and vein specialists but he declined.  He would like to see how he does with conservative measures.    Medication Adjustments/Labs and Tests Ordered: Current medicines are reviewed at length with the patient today.  Concerns regarding medicines are outlined above.  No orders of the defined types were placed in this encounter.  No orders of the defined types were placed in this encounter.   Patient Instructions  Medication Instructions:  Your provider recommends that you continue on your current medications as directed. Please refer to the Current Medication list given to you today.    Labwork: None  Testing/Procedures: None  Follow-Up: Your provider wants you to follow-up in: 1 year with Dr. Burt Knack. You will receive a reminder letter in the mail two months in advance. If you don't receive a letter, please call our office to schedule the follow-up appointment.     You have been given a prescription for compression stocking.   For your  leg edema you  should do  the following 1. Leg elevation - I recommend the Lounge Dr. Leg rest.  See below for details  2. Salt restriction  -  Use potassium chloride instead of regular salt as a salt substitute. 3. Walk regularly 4. Compression hose - [facility] 5. Weight loss    Available on [email or web address] Or  Go to [email or web address]         Signed, Sherren Mocha, MD  03/17/2018 5:07 PM    [place]

## 2018-03-31 MED FILL — LATANOPROST 0.005% EYE DRP: 0.005 | 25 days supply | Qty: 3 | Fill #2 | Status: TO

## 2018-04-01 MED FILL — LISINOPRIL 10 MG TABS: 10 | 90 days supply | Qty: 90 | Fill #1

## 2018-05-09 MED FILL — LATANOPROST 0.005% EYE DRP: 0.005 | 25 days supply | Qty: 3 | Fill #0

## 2018-06-03 MED FILL — LATANOPROST 0.005% EYE DRP: 0.005 | 25 days supply | Qty: 3 | Fill #0

## 2018-06-06 ENCOUNTER — Other Ambulatory Visit: Payer: Self-pay | Admitting: Cardiovascular Disease

## 2018-06-06 MED ORDER — SIMVASTATIN 20 MG PO TABS: 20.0000 mg | ORAL_TABLET | Freq: Every day | ORAL | 3 refills | Status: DC

## 2018-06-06 MED FILL — SIMVASTATIN 20 MG TABLET: 20 | 90 days supply | Qty: 90 | Fill #0

## 2018-06-06 NOTE — Telephone Encounter (Signed)
Pt's medication was sent to pt's pharmacy as requested. Confirmation received.  °

## 2018-06-06 NOTE — Telephone Encounter (Signed)
*  STAT* If patient is at the pharmacy, call can be transferred to refill team.   1. Which medications need to be refilled? (please list name of each medication and dose if known) simvastatin (ZOCOR) 20 MG tabletsimvastatin (ZOCOR) 20 MG tablet  2. Which pharmacy/location (including street and city if local pharmacy) is medication to be sent to? [place]  3. Do they need a 30 day or 90 day supply? 90 days

## 2018-06-24 ENCOUNTER — Emergency Department (HOSPITAL_COMMUNITY): Payer: Medicare Other

## 2018-06-24 ENCOUNTER — Encounter (HOSPITAL_COMMUNITY): Payer: Self-pay

## 2018-06-24 ENCOUNTER — Emergency Department (HOSPITAL_COMMUNITY)
Admission: EM | Admit: 2018-06-24 | Discharge: 2018-06-24 | Disposition: A | Discharge status: Home / Self Care | Payer: Medicare Other | Attending: Emergency Medicine | Admitting: Emergency Medicine

## 2018-06-24 ENCOUNTER — Other Ambulatory Visit: Payer: Self-pay

## 2018-06-24 DIAGNOSIS — Z87891 Personal history of nicotine dependence: Secondary | ICD-10-CM | POA: Insufficient documentation

## 2018-06-24 DIAGNOSIS — R079 Chest pain, unspecified: Secondary | ICD-10-CM | POA: Diagnosis not present

## 2018-06-24 DIAGNOSIS — R0789 Other chest pain: Secondary | ICD-10-CM

## 2018-06-24 DIAGNOSIS — Y9389 Activity, other specified: Secondary | ICD-10-CM | POA: Diagnosis not present

## 2018-06-24 DIAGNOSIS — Z79899 Other long term (current) drug therapy: Secondary | ICD-10-CM | POA: Diagnosis not present

## 2018-06-24 DIAGNOSIS — I10 Essential (primary) hypertension: Secondary | ICD-10-CM | POA: Diagnosis not present

## 2018-06-24 DIAGNOSIS — Y998 Other external cause status: Secondary | ICD-10-CM | POA: Diagnosis not present

## 2018-06-24 DIAGNOSIS — S299XXA Unspecified injury of thorax, initial encounter: Secondary | ICD-10-CM | POA: Diagnosis not present

## 2018-06-24 DIAGNOSIS — Z85828 Personal history of other malignant neoplasm of skin: Secondary | ICD-10-CM | POA: Insufficient documentation

## 2018-06-24 DIAGNOSIS — Z955 Presence of coronary angioplasty implant and graft: Secondary | ICD-10-CM | POA: Insufficient documentation

## 2018-06-24 DIAGNOSIS — I2510 Atherosclerotic heart disease of native coronary artery without angina pectoris: Secondary | ICD-10-CM | POA: Diagnosis not present

## 2018-06-24 DIAGNOSIS — Y92410 Unspecified street and highway as the place of occurrence of the external cause: Secondary | ICD-10-CM | POA: Insufficient documentation

## 2018-06-24 MED ORDER — ACETAMINOPHEN 500 MG PO TABS
1000.0000 mg | ORAL_TABLET | Freq: Once | ORAL | Status: AC
  Administered 2018-06-24: 1000 mg via ORAL
  Filled 2018-06-24: qty 2

## 2018-06-24 NOTE — Discharge Instructions (Addendum)
Take ibuprofen and tylenol for the pain

## 2018-06-24 NOTE — ED Notes (Signed)
Patient transported to X-ray 
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
## 2018-07-03 DIAGNOSIS — H401131 Primary open-angle glaucoma, bilateral, mild stage: Secondary | ICD-10-CM | POA: Diagnosis not present

## 2018-07-03 DIAGNOSIS — Z961 Presence of intraocular lens: Secondary | ICD-10-CM | POA: Diagnosis not present

## 2018-07-03 DIAGNOSIS — H524 Presbyopia: Secondary | ICD-10-CM | POA: Diagnosis not present

## 2018-07-03 MED FILL — LATANOPROST 0.005% EYE DRP: 0.005 | 25 days supply | Qty: 3 | Fill #0

## 2018-07-04 MED FILL — LISINOPRIL 10 MG TABS: 10 | 90 days supply | Qty: 90 | Fill #2

## 2018-07-19 DIAGNOSIS — Z0000 Encounter for general adult medical examination without abnormal findings: Secondary | ICD-10-CM | POA: Diagnosis not present

## 2018-07-19 DIAGNOSIS — I2510 Atherosclerotic heart disease of native coronary artery without angina pectoris: Secondary | ICD-10-CM | POA: Diagnosis not present

## 2018-07-19 DIAGNOSIS — E7800 Pure hypercholesterolemia, unspecified: Secondary | ICD-10-CM | POA: Diagnosis not present

## 2018-07-19 DIAGNOSIS — R413 Other amnesia: Secondary | ICD-10-CM | POA: Diagnosis not present

## 2018-07-21 DIAGNOSIS — Z5181 Encounter for therapeutic drug level monitoring: Secondary | ICD-10-CM | POA: Diagnosis not present

## 2018-07-21 DIAGNOSIS — E7800 Pure hypercholesterolemia, unspecified: Secondary | ICD-10-CM | POA: Diagnosis not present

## 2018-08-08 MED FILL — LATANOPROST 0.005% EYE DRP: 0.005 | 25 days supply | Qty: 3 | Fill #1

## 2018-08-28 DIAGNOSIS — J309 Allergic rhinitis, unspecified: Secondary | ICD-10-CM | POA: Diagnosis not present

## 2018-08-28 DIAGNOSIS — J3089 Other allergic rhinitis: Secondary | ICD-10-CM | POA: Diagnosis not present

## 2018-08-28 DIAGNOSIS — J301 Allergic rhinitis due to pollen: Secondary | ICD-10-CM | POA: Diagnosis not present

## 2018-08-28 DIAGNOSIS — J3081 Allergic rhinitis due to animal (cat) (dog) hair and dander: Secondary | ICD-10-CM | POA: Diagnosis not present

## 2018-08-28 MED FILL — LEVOCETIRIZINE 5 MG TABLET: 5 | 30 days supply | Qty: 30 | Fill #0

## 2018-08-28 MED FILL — AZELASTINE HCL 137 MCG SPRY: 0.1 | 50 days supply | Qty: 30 | Fill #0

## 2018-09-01 MED FILL — SIMVASTATIN 20 MG TABLET: 20 | 90 days supply | Qty: 90 | Fill #1

## 2018-09-22 MED FILL — LATANOPROST 0.005% EYE DRP: 0.005 | 25 days supply | Qty: 3 | Fill #2

## 2018-09-25 MED FILL — LISINOPRIL 10 MG TABS: 10 | 90 days supply | Qty: 90 | Fill #3

## 2018-09-25 MED FILL — LEVOCETIRIZINE 5 MG TABLET: 5 | 30 days supply | Qty: 30 | Fill #1

## 2018-10-02 MED FILL — tiZANidine HCL 2 MG TABS: 2 | 5 days supply | Qty: 30 | Fill #0

## 2018-10-05 DIAGNOSIS — M542 Cervicalgia: Secondary | ICD-10-CM | POA: Diagnosis not present

## 2018-10-05 MED FILL — DICLOFENAC SODIUM 75 MG TAB: 75 | 15 days supply | Qty: 30 | Fill #0

## 2018-10-19 MED FILL — LATANOPROST 0.005% EYE DRP: 0.005 | 25 days supply | Qty: 3 | Fill #3

## 2018-10-23 MED FILL — LEVOCETIRIZINE 5 MG TABLET: 5 | 30 days supply | Qty: 30 | Fill #2

## 2018-11-04 DIAGNOSIS — Z23 Encounter for immunization: Secondary | ICD-10-CM | POA: Diagnosis not present

## 2018-11-06 DIAGNOSIS — H401131 Primary open-angle glaucoma, bilateral, mild stage: Secondary | ICD-10-CM | POA: Diagnosis not present

## 2018-11-21 MED FILL — SIMVASTATIN 20 MG TABLET: 20 | 90 days supply | Qty: 90 | Fill #2

## 2018-11-21 MED FILL — LEVOCETIRIZINE 5 MG TABLET: 5 | 30 days supply | Qty: 30 | Fill #3

## 2018-11-21 MED FILL — LATANOPROST 0.005% EYE DRP: 0.005 | 25 days supply | Qty: 3 | Fill #4

## 2018-11-29 DIAGNOSIS — H6123 Impacted cerumen, bilateral: Secondary | ICD-10-CM | POA: Diagnosis not present

## 2018-11-29 DIAGNOSIS — J300 Vasomotor rhinitis: Secondary | ICD-10-CM | POA: Insufficient documentation

## 2018-11-29 DIAGNOSIS — J310 Chronic rhinitis: Secondary | ICD-10-CM | POA: Diagnosis not present

## 2018-11-29 DIAGNOSIS — H9113 Presbycusis, bilateral: Secondary | ICD-10-CM | POA: Insufficient documentation

## 2018-11-29 DIAGNOSIS — H903 Sensorineural hearing loss, bilateral: Secondary | ICD-10-CM | POA: Diagnosis not present

## 2018-11-29 MED FILL — IPRATROPIUM 0.06% SPRAY: 0.06 | 10 days supply | Qty: 15 | Fill #0

## 2018-12-14 MED FILL — DICLOFENAC SODIUM 75 MG TAB: 75 | 15 days supply | Qty: 30 | Fill #0

## 2018-12-25 ENCOUNTER — Other Ambulatory Visit: Payer: Self-pay | Admitting: Cardiovascular Disease

## 2018-12-25 MED FILL — LEVOCETIRIZINE 5 MG TABLET: 5 | 30 days supply | Qty: 30 | Fill #0

## 2018-12-26 MED FILL — LISINOPRIL 10 MG TABS: 10 | 90 days supply | Qty: 90 | Fill #0

## 2018-12-27 MED FILL — LATANOPROST 0.005% EYE DRP: 0.005 | 25 days supply | Qty: 3 | Fill #5

## 2019-01-01 MED FILL — AZELASTINE HCL 137 MCG SPRY: 0.1 | 50 days supply | Qty: 30 | Fill #1

## 2019-01-22 MED FILL — LEVOCETIRIZINE 5 MG TABLET: 5 | 30 days supply | Qty: 30 | Fill #1

## 2019-01-22 MED FILL — LATANOPROST 0.005% EYE DRP: 0.005 | 18 days supply | Qty: 3 | Fill #0

## 2019-01-25 ENCOUNTER — Ambulatory Visit: Payer: Medicare Other | Attending: Internal Medicine

## 2019-01-25 DIAGNOSIS — Z23 Encounter for immunization: Secondary | ICD-10-CM | POA: Insufficient documentation

## 2019-01-25 NOTE — Progress Notes (Signed)
   Covid-19 Vaccination Clinic  Name:  Jesus Harmon    MRN: [id number] DOB: 01-28-1929  01/25/2019  Jesus Harmon was observed post Covid-19 immunization for 15 minutes without incidence. He was provided with Vaccine Information Sheet and instruction to access the V-Safe system.   Jesus Harmon was instructed to call 911 with any severe reactions post vaccine: Marland Kitchen Difficulty breathing  . Swelling of your face and throat  . A fast heartbeat  . A bad rash all over your body  . Dizziness and weakness    Immunizations Administered    Name Date Dose VIS Date Route   Pfizer COVID-19 Vaccine 01/25/2019  9:26 AM 0.3 mL 12/22/2018 Intramuscular   Manufacturer: [place]   Lot: [id number]   [place]: [id number]

## 2019-02-06 DIAGNOSIS — H43392 Other vitreous opacities, left eye: Secondary | ICD-10-CM | POA: Diagnosis not present

## 2019-02-06 DIAGNOSIS — H401131 Primary open-angle glaucoma, bilateral, mild stage: Secondary | ICD-10-CM | POA: Diagnosis not present

## 2019-02-14 ENCOUNTER — Ambulatory Visit: Payer: Medicare Other | Attending: Internal Medicine

## 2019-02-14 DIAGNOSIS — Z23 Encounter for immunization: Secondary | ICD-10-CM | POA: Insufficient documentation

## 2019-02-14 NOTE — Progress Notes (Signed)
   Covid-19 Vaccination Clinic  Name:  Jesus Harmon    MRN: [id number] DOB: 11-18-1929  02/14/2019  [name] was observed post Covid-19 immunization for 15 minutes without incidence. He was provided with Vaccine Information Sheet and instruction to access the V-Safe system.   [name] was instructed to call 911 with any severe reactions post vaccine: Marland Kitchen Difficulty breathing  . Swelling of your face and throat  . A fast heartbeat  . A bad rash all over your body  . Dizziness and weakness    Immunizations Administered    Name Date Dose VIS Date Route   Pfizer COVID-19 Vaccine 02/14/2019  8:59 AM 0.3 mL 12/22/2018 Intramuscular   Manufacturer: [place]   Lot: [id number]   [place]: [id number]

## 2019-02-19 DIAGNOSIS — J309 Allergic rhinitis, unspecified: Secondary | ICD-10-CM | POA: Diagnosis not present

## 2019-02-19 DIAGNOSIS — J3089 Other allergic rhinitis: Secondary | ICD-10-CM | POA: Diagnosis not present

## 2019-02-19 DIAGNOSIS — J3081 Allergic rhinitis due to animal (cat) (dog) hair and dander: Secondary | ICD-10-CM | POA: Diagnosis not present

## 2019-02-19 DIAGNOSIS — J301 Allergic rhinitis due to pollen: Secondary | ICD-10-CM | POA: Diagnosis not present

## 2019-02-19 MED FILL — LEVOCETIRIZINE 5 MG TABLET: 5 | 30 days supply | Qty: 30 | Fill #2

## 2019-02-19 MED FILL — SIMVASTATIN 20 MG TABLET: 20 | 90 days supply | Qty: 90 | Fill #3

## 2019-02-21 DIAGNOSIS — M25511 Pain in right shoulder: Secondary | ICD-10-CM | POA: Diagnosis not present

## 2019-02-21 DIAGNOSIS — M67819 Other specified disorders of synovium and tendon, unspecified shoulder: Secondary | ICD-10-CM | POA: Diagnosis not present

## 2019-02-21 DIAGNOSIS — M19011 Primary osteoarthritis, right shoulder: Secondary | ICD-10-CM | POA: Diagnosis not present

## 2019-02-26 DIAGNOSIS — L858 Other specified epidermal thickening: Secondary | ICD-10-CM | POA: Diagnosis not present

## 2019-02-26 DIAGNOSIS — Z85828 Personal history of other malignant neoplasm of skin: Secondary | ICD-10-CM | POA: Diagnosis not present

## 2019-02-26 DIAGNOSIS — L821 Other seborrheic keratosis: Secondary | ICD-10-CM | POA: Diagnosis not present

## 2019-02-26 DIAGNOSIS — L570 Actinic keratosis: Secondary | ICD-10-CM | POA: Diagnosis not present

## 2019-03-12 ENCOUNTER — Other Ambulatory Visit (HOSPITAL_COMMUNITY): Payer: Self-pay | Admitting: Allergy

## 2019-03-12 MED FILL — LATANOPROST 0.005% EYE DRP: 0.005 | 18 days supply | Qty: 3 | Fill #1

## 2019-03-12 MED FILL — AZELASTINE HCL 137 MCG SPRY: 0.1 | 50 days supply | Qty: 30 | Fill #2

## 2019-03-12 MED FILL — LISINOPRIL 10 MG TABS: 10 | 90 days supply | Qty: 90 | Fill #1

## 2019-03-19 MED FILL — LEVOCETIRIZINE DIHYDROCHLOR: 5 | 30 days supply | Qty: 30 | Fill #0

## 2019-03-27 DIAGNOSIS — R2689 Other abnormalities of gait and mobility: Secondary | ICD-10-CM | POA: Diagnosis not present

## 2019-03-27 DIAGNOSIS — R634 Abnormal weight loss: Secondary | ICD-10-CM | POA: Diagnosis not present

## 2019-03-27 DIAGNOSIS — R413 Other amnesia: Secondary | ICD-10-CM | POA: Diagnosis not present

## 2019-04-09 ENCOUNTER — Encounter: Payer: Self-pay | Admitting: Neurology

## 2019-04-10 MED FILL — LATANOPROST 0.005% EYE DRP: 0.005 | 18 days supply | Qty: 3 | Fill #2

## 2019-04-10 NOTE — Progress Notes (Signed)
Cardiology Office Note    Date:  04/11/2019   ID:  Jesus Harmon, DOB 1929/09/10, MRN [id number]  PCP:  Aurea Graff.Marlou Sa, MD  Cardiologist:  Dr. Burt Knack   Chief Complaint: 12   Months follow up  History of Present Illness:   Jesus Harmon is a 86 y.o. male with hx of CAD, HTN and HLD seen for follow up.   Hx of coronary artery disease s/p DES to RCA in 2018.  He has residual disease with moderate proximal LAD stenosis that was hemodynamically significant at the time of his Cardiac Catheterization.  Angioplasty was attempted but a balloon could not be expanded.  He has been managed medically.  If he has refractory angina, atherectomy and stenting of the LAD can be attempted.   Having Le edema feb/Mar 2020> improved on lasix.  Echo 02/24/18 showed normal LVEF.  Here today for follow up. Patient has memory and balance issue and has appointment with neurology in June. He has fallen 1 time. No prodromal symptoms. He is very active doing yard work. He denies chest pain, shortness of breath, orthopnea, PND, syncope, lower extremity edema or melena.  Past Medical History:  Diagnosis Date  . Arthritis    back and neck (06/16/2016)  . CAD (coronary artery disease)    a. LHC 10/2009:  pLAD 50%, small D1 70-80% (too small for PCI), EF 65% => med Rx  b. Myoview 10/2009: EF 65%, no ischemia;  c.  ETT-MV 3/14:  No ischemia, EF 63%  . Cancer [facility])    sin cancer removed nose   . History of echocardiogram    Echo 02/2018: EF 123456, +diastolic dysfunction, no RWMA, normal RVSF, trivial MR  . History of kidney stones    "passed them"  . HLD (hyperlipidemia)    "on RX; never had high cholestrol" (06/16/2016)  . HTN (hypertension)    "on RX; never HTN" (06/16/2016)  . Migraine    "in high school" (06/16/2016)    Past Surgical History:  Procedure Laterality Date  . BASAL CELL CARCINOMA EXCISION     "one of my ears; big one on my nose; one on my left shoulder" (06/16/2016)  . CARDIAC CATHETERIZATION   2017   "treated w/RX"  . CATARACT EXTRACTION W/ INTRAOCULAR LENS  IMPLANT, BILATERAL Bilateral   . CORONARY ANGIOPLASTY WITH STENT PLACEMENT  06/16/2016  . CORONARY BALLOON ANGIOPLASTY N/A 06/16/2016   Procedure: Coronary Balloon Angioplasty;  Surgeon: Sherren Mocha, MD;  Location: [place] [facility];  Service: Cardiovascular;  Laterality: N/A;  Prox LAD  . CORONARY STENT INTERVENTION  06/16/2016   Procedure: Coronary Stent Intervention;  Surgeon: Sherren Mocha, MD;  Location: [place] [facility];  Service: Cardiovascular;;  Synergy 4.0x16 to Mid RCA  . INGUINAL HERNIA REPAIR Left 12/22/2016   Procedure: REPAIR OF LEFT INGUINAL HERNIA WITH MESH;  Surgeon: Georganna Skeans, MD;  Location: [facility];  Service: General;  Laterality: Left;  . INSERTION OF MESH Left 12/22/2016   Procedure: INSERTION OF MESH;  Surgeon: Georganna Skeans, MD;  Location: [facility];  Service: General;  Laterality: Left;  . INTRAVASCULAR PRESSURE WIRE/FFR STUDY N/A 06/16/2016   Procedure: Intravascular Pressure Wire/FFR Study;  Surgeon: Sherren Mocha, MD;  Location: [place] [facility];  Service: Cardiovascular;  Laterality: N/A;  Prox LAD  . KNEE ARTHROSCOPY     "not sure which side"  . LEFT HEART CATH AND CORONARY ANGIOGRAPHY N/A 06/16/2016   Procedure: Left Heart Cath and Coronary Angiography;  Surgeon: Sherren Mocha, MD;  Location: [place] [facility];  Service: Cardiovascular;  Laterality: N/A;  . TONSILLECTOMY    . TRANSURETHRAL RESECTION OF PROSTATE N/A 03/04/2014   Procedure: TRANSURETHRAL RESECTION OF THE PROSTATE, TRANSURETHRAL RESECTION OF THE BLADDER TUMOR;  Surgeon: Ailene Rud, MD;  Location: [facility];  Service: Urology;  Laterality: N/A;    Current Medications: Prior to Admission medications   Medication Sig Start Date End Date Taking? Authorizing Provider  aspirin EC 81 MG tablet Take 81 mg by mouth every evening.     [provider]  cetirizine (ZYRTEC) 10 MG tablet Take 10 mg by mouth daily.     [provider]  furosemide (LASIX) 20 MG tablet Take 20 mg daily for 3 days, then as needed for swelling. 02/21/18   Kathlen Mody, Nicki Reaper T, PA-C  latanoprost (XALATAN) 0.005 % ophthalmic solution Place 1 drop into both eyes at bedtime.     [provider]  lisinopril (ZESTRIL) 10 MG tablet TAKE 1 TABLET BY MOUTH DAILY. 12/26/18   Sherren Mocha, MD  nitroGLYCERIN (NITROSTAT) 0.4 MG SL tablet Place 1 tablet (0.4 mg total) under the tongue every 5 (five) minutes as needed for chest pain. 12/01/17   Sherren Mocha, MD  simvastatin (ZOCOR) 20 MG tablet Take 1 tablet (20 mg total) by mouth daily. 06/06/18   Sherren Mocha, MD    Allergies:   Amoxicillin, Sulfonamide derivatives, Sulfa antibiotics, and Sulfamethoxazole   Social History   Socioeconomic History  . Marital status: Married    Spouse name: Not on file  . Number of children: Not on file  . Years of education: Not on file  . Highest education level: Not on file  Occupational History  . Occupation: retired  . Occupation: part time car auctioneer  Tobacco Use  . Smoking status: Former Smoker    Packs/day: 1.00    Years: 18.00    Pack years: 18.00    Quit date: 01/12/1971    Years since quitting: 48.2  . Smokeless tobacco: Never Used  Substance and Sexual Activity  . Alcohol use: No  . Drug use: No  . Sexual activity: Not on file  Other Topics Concern  . Not on file  Social History Narrative  . Not on file   Social Determinants of Health   Financial Resource Strain:   . Difficulty of Paying Living Expenses:   Food Insecurity:   . Worried About Charity fundraiser in the Last Year:   . Arboriculturist in the Last Year:   Transportation Needs:   . Film/video editor (Medical):   Marland Kitchen Lack of Transportation (Non-Medical):   Physical Activity:   . Days of Exercise per Week:   . Minutes of Exercise per Session:   Stress:   . Feeling of Stress :   Social Connections:   . Frequency of Communication with  Friends and Family:   . Frequency of Social Gatherings with Friends and Family:   . Attends Religious Services:   . Active Member of Clubs or Organizations:   . Attends Archivist Meetings:   Marland Kitchen Marital Status:      Family History:  The patient's family history includes COPD in his father; Heart failure in his father; Sudden death in his mother.   ROS:   Please see the history of present illness.    ROS All other systems reviewed and are negative.   PHYSICAL EXAM:   VS:  BP 140/60  Pulse (!) 56   Ht 6' (1.829 m)   Wt 137 lb (62.1 kg)   SpO2 99%   BMI 18.58 kg/m    GEN: Well nourished, well developed, in no acute distress  HEENT: normal  Neck: no JVD, carotid bruits, or masses Cardiac:RRR; no murmurs, rubs, or gallops, trace  edema  Respiratory:  clear to auscultation bilaterally, normal work of breathing GI: soft, nontender, nondistended, + BS MS: no deformity or atrophy  Skin: warm and dry, no rash Neuro:  Alert and Oriented x 3, Strength and sensation are intact Psych: euthymic mood, full affect  Wt Readings from Last 3 Encounters:  04/11/19 137 lb (62.1 kg)  06/24/18 145 lb (65.8 kg)  03/17/18 149 lb (67.6 kg)      Studies/Labs Reviewed:   EKG:  EKG is ordered today.  The ekg ordered today demonstrates sinus bradycardia, no significant changes   Recent Labs: No results found for requested labs within last 8760 hours.   Lipid Panel    Component Value Date/Time   CHOL 153 11/06/2010 1014   TRIG 34.0 11/06/2010 1014   HDL 82.40 11/06/2010 1014   CHOLHDL 2 11/06/2010 1014   VLDL 6.8 11/06/2010 1014   LDLCALC 64 11/06/2010 1014    Additional studies/ records that were reviewed today include:   Echocardiogram: 02/2018 1. The left ventricle has normal systolic function, with an ejection  fraction of 55-60%. The cavity size was normal. Left ventricular diastolic  Doppler parameters are consistent with pseudonormalization No evidence of  left  ventricular regional wall  motion abnormalities.  2. The right ventricle has normal systolic function. The cavity was  normal. There is no increase in right ventricular wall thickness.  3. The mitral valve is normal in structure. No evidence of mitral valve  stenosis. Trivial regurgitation.  4. The tricuspid valve is normal in structure.  5. The aortic valve is tricuspid Mild calcification of the aortic valve.  no stenosis of the aortic valve.  6. The pulmonic valve was normal in structure.  7. The inferior vena cava was dilated in size with >50% respiratory  variability.  8. Right atrial pressure is estimated at 8 mmHg.  9. PA systolic pressure 34 mmHg.   Coronary Balloon Angioplasty  06/2016  Coronary Stent Intervention  Intravascular Pressure Wire/FFR Study  Left Heart Cath and Coronary Angiography  Conclusion  1. Severe stenosis of the mid right coronary artery treated successfully with PTCA and stenting using a 4.0 x 16 mm Synergy DES 2. Severe proximal LAD stenosis confirmed by FFR analysis (0.72) but unsuccessful angioplasty because of inability to expand a balloon in this rigid lesion 3. Widely patent left main and left circumflex 4. Normal LV function by noninvasive assessment  Recommendation: The patient will be observed overnight. Will continue dual antiplatelet therapy with aspirin and clopidogrel for 12 months. Consider atherectomy and PCI of the LAD lesion in a few weeks after reassessment of the patient in the outpatient setting.       ASSESSMENT & PLAN:    1. CAD -No angina. Continue aspirin and statin.  2. HTN -Blood pressure stable on lisinopril.  3. HLD -Continue Zocor 20 mg LDL 48 on July 2020  4. Balance and memory problems -Advised to get balance before movement. Has follow-up with neurology in upcoming months. -Recommended rest between activity  5. Lower extremity edema -Mild and chronic. Recommended continuing compression stocking, leg  elevation and rest. He uses low-sodium diet.  Medication Adjustments/Labs and Tests  Ordered: Current medicines are reviewed at length with the patient today.  Concerns regarding medicines are outlined above.  Medication changes, Labs and Tests ordered today are listed in the Patient Instructions below. Patient Instructions  Medication Instructions:  Your physician recommends that you continue on your current medications as directed. Please refer to the Current Medication list given to you today.  *If you need a refill on your cardiac medications before your next appointment, please call your pharmacy*   Lab Work: None If you have labs (blood work) drawn today and your tests are completely normal, you will receive your results only by: Marland Kitchen MyChart Message (if you have MyChart) OR . A paper copy in the mail If you have any lab test that is abnormal or we need to change your treatment, we will call you to review the results.   Testing/Procedures: None   Follow-Up: At [facility], you and your health needs are our priority.  As part of our continuing mission to provide you with exceptional heart care, we have created designated Provider Care Teams.  These Care Teams include your primary Cardiologist (physician) and Advanced Practice Providers (APPs -  Physician Assistants and Nurse Practitioners) who all work together to provide you with the care you need, when you need it.  We recommend signing up for the patient portal called "MyChart".  Sign up information is provided on this After Visit Summary.  MyChart is used to connect with patients for Virtual Visits (Telemedicine).  Patients are able to view lab/test results, encounter notes, upcoming appointments, etc.  Non-urgent messages can be sent to your provider as well.   To learn more about what you can do with MyChart, go to [email or web address].    Your next appointment:   12 month(s)  The format for your next appointment:   In  Person  Provider:   You may see Sherren Mocha, MD or one of the following Advanced Practice Providers on your designated Care Team:    Richardson Dopp, PA-C  [address], PA-C     [place] Longest [place], [place]  04/11/2019 10:23 AM    [address], [place], [place]  [address] Phone: [phone]; Fax: [phone]

## 2019-04-11 ENCOUNTER — Encounter (INDEPENDENT_AMBULATORY_CARE_PROVIDER_SITE_OTHER): Payer: Self-pay

## 2019-04-11 ENCOUNTER — Other Ambulatory Visit: Payer: Self-pay

## 2019-04-11 ENCOUNTER — Ambulatory Visit: Payer: Medicare Other | Admitting: Physician Assistant

## 2019-04-11 ENCOUNTER — Encounter: Payer: Self-pay | Admitting: Physician Assistant

## 2019-04-11 VITALS — BP 140/60 | HR 56 | Ht 72.0 in | Wt 137.0 lb

## 2019-04-11 DIAGNOSIS — I10 Essential (primary) hypertension: Secondary | ICD-10-CM

## 2019-04-11 DIAGNOSIS — M7989 Other specified soft tissue disorders: Secondary | ICD-10-CM

## 2019-04-11 DIAGNOSIS — E782 Mixed hyperlipidemia: Secondary | ICD-10-CM

## 2019-04-11 DIAGNOSIS — R413 Other amnesia: Secondary | ICD-10-CM

## 2019-04-11 DIAGNOSIS — I2510 Atherosclerotic heart disease of native coronary artery without angina pectoris: Secondary | ICD-10-CM

## 2019-04-11 DIAGNOSIS — R2689 Other abnormalities of gait and mobility: Secondary | ICD-10-CM

## 2019-04-11 MED FILL — LEVOCETIRIZINE 5 MG TABLET: 5 | 30 days supply | Qty: 30 | Fill #1

## 2019-04-11 NOTE — Patient Instructions (Signed)
Medication Instructions:  Your physician recommends that you continue on your current medications as directed. Please refer to the Current Medication list given to you today.  *If you need a refill on your cardiac medications before your next appointment, please call your pharmacy*   Lab Work: None If you have labs (blood work) drawn today and your tests are completely normal, you will receive your results only by: Marland Kitchen MyChart Message (if you have MyChart) OR . A paper copy in the mail If you have any lab test that is abnormal or we need to change your treatment, we will call you to review the results.   Testing/Procedures: None   Follow-Up: At [facility], you and your health needs are our priority.  As part of our continuing mission to provide you with exceptional heart care, we have created designated Provider Care Teams.  These Care Teams include your primary Cardiologist (physician) and Advanced Practice Providers (APPs -  Physician Assistants and Nurse Practitioners) who all work together to provide you with the care you need, when you need it.  We recommend signing up for the patient portal called "MyChart".  Sign up information is provided on this After Visit Summary.  MyChart is used to connect with patients for Virtual Visits (Telemedicine).  Patients are able to view lab/test results, encounter notes, upcoming appointments, etc.  Non-urgent messages can be sent to your provider as well.   To learn more about what you can do with MyChart, go to [email or web address].    Your next appointment:   12 month(s)  The format for your next appointment:   In Person  Provider:   You may see Sherren Mocha, MD or one of the following Advanced Practice Providers on your designated Care Team:    Richardson Dopp, PA-C  [place], [place]
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
## 2019-05-10 ENCOUNTER — Encounter: Payer: Self-pay | Admitting: Neurology

## 2019-05-10 ENCOUNTER — Ambulatory Visit: Payer: Medicare Other | Admitting: Neurology

## 2019-05-10 ENCOUNTER — Other Ambulatory Visit: Payer: Self-pay

## 2019-05-10 ENCOUNTER — Other Ambulatory Visit: Payer: Self-pay | Admitting: Cardiovascular Disease

## 2019-05-10 VITALS — BP 148/59 | HR 57 | Temp 98.7°F | Wt 139.0 lb

## 2019-05-10 DIAGNOSIS — F0390 Unspecified dementia without behavioral disturbance: Secondary | ICD-10-CM | POA: Diagnosis not present

## 2019-05-10 DIAGNOSIS — R9089 Other abnormal findings on diagnostic imaging of central nervous system: Secondary | ICD-10-CM

## 2019-05-10 DIAGNOSIS — R2689 Other abnormalities of gait and mobility: Secondary | ICD-10-CM

## 2019-05-10 MED FILL — NITROGLYCERIN 0.4 MG TAB SL: 0.4 | 20 days supply | Qty: 25 | Fill #0

## 2019-05-10 NOTE — Patient Instructions (Signed)
Formal Memory Testing (Dr Sima Matas) FDG PET Scan Bloodwork   Vascular Dementia Dementia is a condition in which a person has problems with thinking, memory, and behavior that are severe enough to interfere with daily life. Vascular dementia is a type of dementia. It results from brain damage that is caused by the brain not getting enough blood. This condition may also be called vascular cognitive impairment. What are the causes? Vascular dementia is caused by conditions that lessen blood flow to the brain. Common causes of this condition include:  Multiple small strokes. These may happen without symptoms (silent stroke).  Major stroke.  Damage to small blood vessels in the brain (cerebral small vessel disease). What increases the risk? The following factors may make you more likely to develop this condition:  Having had a stroke.  Having high blood pressure (hypertension) or high cholesterol.  Having a disease that affects the heart or blood vessels.  Smoking.  Having diabetes.  Having metabolic syndrome.  Being obese.  Not being active.  Having depression.  Being over age 70. What are the signs or symptoms? Symptoms can vary from one person to another. Symptoms may be mild or severe depending on the amount of damage and which parts of the brain have been affected. Symptoms may begin suddenly or may develop slowly. Mental symptoms of vascular dementia may include:  Confusion.  Memory problems.  Poor attention and concentration.  Trouble understanding speech.  Depression.  Personality changes.  Trouble recognizing familiar people.  Agitation or aggression.  Paranoia.  Delusions or hallucinations. Physical symptoms of vascular dementia may include:  Weakness.  Poor balance.  Loss of bladder or bowel control (incontinence).  Unsteady walking (gait).  Speaking problems. Behavioral symptoms of vascular dementia may include:  Getting lost in  familiar places.  Problems with planning and judgment.  Trouble following instructions.  Social problems.  Emotional outbursts.  Trouble with daily activities and self-care.  Problems handling money. Symptoms may remain stable, or they may get worse over time. Symptoms of vascular dementia may be similar to those of Alzheimer's disease. The two conditions can occur together (mixed dementia). How is this diagnosed? Your health care provider will consider your medical history and symptoms or changes that are reported by friends and family. Your health care provider will do a physical exam and may order lab tests or other tests that check brain and nervous system function. Tests that may be done include:  Blood tests.  Brain imaging tests.  Tests of movement, speech, and other daily activities (neurological exam).  Tests of memory, thinking, and problem-solving (neuropsychological or neurocognitive testing). There is not a specific test to diagnose vascular dementia. Diagnosis may involve several specialists. These may include:  A health care provider who specializes in the brain and nervous system (neurologist).  A health provider who specializes in understanding how problems in the brain can alter behavior and cognitive function (neuropsychologist). How is this treated? There is no cure for vascular dementia. Brain damage that has already occurred cannot be reversed. Treatment depends on:  How severe the condition is.  Which parts of your brain have been affected.  Your overall health. Treatment measures aim to:  Treat the underlying cause of vascular dementia and manage risk factors. This may include: ? Controlling blood pressure. ? Lowering cholesterol. ? Treating diabetes. ? Quitting smoking. ? Losing weight or maintaining a healthy weight. ? Eating a healthy, balanced diet. ? Getting regular exercise.  Manage symptoms.  Prevent further brain  damage.  Improve the  person's health and quality of life. Treatment for dementia may involve a team of health care providers, including:  A neurologist.  A provider who specializes in disorders of the mind (psychiatrist).  A provider who specializes in helping people learn daily living skills (occupational therapist).  A provider who focuses on speech and language changes (Electrical engineer).  A heart specialist (cardiologist).  A provider who helps people learn how to manage physical changes, such as movement and walking (exercise physiologist or physical therapist). Follow these instructions at home: Lifestyle  People with vascular dementia may need regular help at home or daily care from a family member or home health care worker. Home care for a person with vascular dementia depends on what caused the condition and how severe the symptoms are. General guidelines for caregivers include:  Help the person with dementia remember people, appointments, and daily activities.  Help the person with dementia manage his or her medicines.  Help family and friends learn about ways to communicate with the person with dementia.  Create a safe living space to reduce the risk of injury or falls.  Find a support group to help caregivers and family cope with the effects of dementia.  General instructions  Help the person take over-the-counter and prescription medicines only as told by the health care provider.  Follow the health care provider's instructions for treating the condition that caused the dementia.  Make sure the person keeps all follow-up visits as told by the health care provider. This is important. Contact a health care provider if:  A fever develops.  New behavioral problems develop.  Problems with swallowing develop.  Confusion gets worse.  Sleepiness gets worse. Get help right away if:  Loss of consciousness occurs.  There is a sudden loss of speech, balance, or thinking  ability.  New numbness or paralysis occurs.  Sudden, severe headache occurs.  Vision is lost or suddenly gets worse in one or both eyes. Summary  Vascular dementia is a type of dementia. It results from brain damage that is caused by the brain not getting enough blood.  Vascular dementia is caused by conditions that lessen blood flow to the brain. Common causes of this condition include stroke and damage to small blood vessels in the brain.  Treatment focuses on treating the underlying cause of vascular dementia and managing any risk factors.  People with vascular dementia may need regular help at home or daily care from a family member or home health care worker.  Contact a health care provider if you or your caregiver notice any new symptoms. This information is not intended to replace advice given to you by your health care provider. Make sure you discuss any questions you have with your health care provider. Document Revised: 09/28/2017 Document Reviewed: 09/29/2017 Elsevier Patient Education  [place].

## 2019-05-13 DIAGNOSIS — F0390 Unspecified dementia without behavioral disturbance: Secondary | ICD-10-CM | POA: Insufficient documentation

## 2019-05-13 LAB — B12 AND FOLATE PANEL: Vitamin B-12: 163 pg/mL — ABNORMAL LOW (ref 232–1245)

## 2019-05-13 LAB — METHYLMALONIC ACID, SERUM

## 2019-05-15 ENCOUNTER — Telehealth: Payer: Self-pay | Admitting: Neurology

## 2019-05-15 NOTE — Telephone Encounter (Signed)
Melvenia Beam, MD  05/13/2019  6:21 PM EDT    Patient has significant B12 deficiency. This can contribute even cause dementia. Recommend 1056mcg b12 shots weekly for 4 weeks and then once a month for 6 months, then oral 1mg  daily(1085mcg) for life. Please fax to pcp office thanks.  recheck B12 regularly with pcp. Thanks.

## 2019-05-15 NOTE — Telephone Encounter (Signed)
Patient's daughter calling about Lab results. Lisa 336 [phone]

## 2019-05-16 LAB — B12 AND FOLATE PANEL: Folate: 13.9 ng/mL (ref >3.0)

## 2019-05-16 LAB — VITAMIN B1: Thiamine: 104.3 nmol/L (ref 66.5–200.0)

## 2019-05-16 LAB — HOMOCYSTEINE: Homocysteine: 24.5 umol/L — ABNORMAL HIGH (ref 0.0–21.3)

## 2019-05-16 LAB — METHYLMALONIC ACID, SERUM: Methylmalonic Acid: 427 nmol/L — ABNORMAL HIGH (ref 0–378)

## 2019-05-16 NOTE — Telephone Encounter (Signed)
I tried to reach the daughter once more and she did not answer. I will try again tomorrow.

## 2019-05-16 NOTE — Telephone Encounter (Signed)
Tried to reach pt's daughter [name] (on [place]). LVM asking for call back to discuss results.

## 2019-05-17 ENCOUNTER — Encounter: Payer: Self-pay | Admitting: Psychology

## 2019-05-17 ENCOUNTER — Other Ambulatory Visit: Payer: Self-pay | Admitting: *Deleted

## 2019-05-17 DIAGNOSIS — E538 Deficiency of other specified B group vitamins: Secondary | ICD-10-CM

## 2019-05-17 MED ORDER — CYANOCOBALAMIN 1000 MCG/ML IJ SOLN
1000.0000 ug | INTRAMUSCULAR | 0 refills | Status: AC
Start: 2019-05-17 — End: 2019-06-08

## 2019-05-17 MED ORDER — CYANOCOBALAMIN 1000 MCG/ML IJ SOLN: 1000.0000 ug | INTRAMUSCULAR | 5 refills | Status: DC

## 2019-05-17 MED ORDER — "SYRINGE/NEEDLE (DISP) 23G X 1"" 3 ML MISC": 1 refills | Status: DC

## 2019-05-17 MED FILL — CYANOCOBALAMIN 1,000 MCG/ML: 1000 | 28 days supply | Qty: 4 | Fill #0

## 2019-05-17 NOTE — Telephone Encounter (Signed)
Pt's daughter Manon Hilding returned my call. We discussed the lab results per Dr. Jaynee Eagles. [name] is a Marine scientist and would like to administer the B12 shots herself if ok. [name] is aware of the schedule: B12 1000 mcg IM once weekly x 4 weeks, then 1000 mcg once monthly x 6 months, then start oral B12 1000 mcg daily OTC for life. B12 levels should be followed by PCP. I let her know I would send the results to PCP. She verbalized understanding & appreciation for the call. Confirmed pharmacy [facility] outpatient. Any questions she had were answered at time of call.

## 2019-05-17 NOTE — Telephone Encounter (Signed)
Yes please

## 2019-05-17 NOTE — Telephone Encounter (Signed)
Orders placed.

## 2019-05-17 NOTE — Progress Notes (Signed)
B12 injections per Dr. Jaynee Eagles.

## 2019-05-18 MED FILL — LATANOPROST 0.005% EYE DRP: 0.005 | 20 days supply | Qty: 3 | Fill #3

## 2019-05-21 ENCOUNTER — Other Ambulatory Visit: Payer: Self-pay | Admitting: Cardiovascular Disease

## 2019-05-22 ENCOUNTER — Other Ambulatory Visit: Payer: Self-pay | Admitting: Cardiovascular Disease

## 2019-05-25 ENCOUNTER — Other Ambulatory Visit: Payer: Self-pay

## 2019-05-25 ENCOUNTER — Ambulatory Visit (HOSPITAL_COMMUNITY)
Admission: RE | Admit: 2019-05-25 | Discharge: 2019-05-25 | Disposition: A | Discharge status: Home / Self Care | Payer: Medicare Other | Source: Ambulatory Visit | Attending: Neurology | Admitting: Neurology

## 2019-05-25 DIAGNOSIS — I6789 Other cerebrovascular disease: Secondary | ICD-10-CM | POA: Insufficient documentation

## 2019-05-25 DIAGNOSIS — G319 Degenerative disease of nervous system, unspecified: Secondary | ICD-10-CM | POA: Diagnosis not present

## 2019-05-25 DIAGNOSIS — F0390 Unspecified dementia without behavioral disturbance: Secondary | ICD-10-CM | POA: Diagnosis not present

## 2019-05-25 LAB — GLUCOSE, CAPILLARY: Glucose-Capillary: 92 mg/dL (ref 70–99)

## 2019-05-25 MED ORDER — FLUDEOXYGLUCOSE F - 18 (FDG) INJECTION
9.1000 | Freq: Once | INTRAVENOUS | Status: AC
  Administered 2019-05-25: 9.1 via INTRAVENOUS

## 2019-05-28 ENCOUNTER — Telehealth: Payer: Self-pay | Admitting: *Deleted

## 2019-05-28 MED FILL — AZELASTINE 0.1% (137 MCG) S: 0.1 | 50 days supply | Qty: 30 | Fill #3

## 2019-05-28 NOTE — Telephone Encounter (Signed)
Pt's daughter Manon Hilding called office and left VM asking for results of pt's PET scan. She asked to be called at [phone].

## 2019-05-28 NOTE — Telephone Encounter (Signed)
-----   Message from Melvenia Beam, MD sent at 05/27/2019  9:15 PM EDT ----- No specific findings identified to suggest Alzheimer's or frontotemporal dementia, this is great but doesn't rule out other less common dementias. He should still see Dr. Sima Matas for formal memory testing in September to complete the workup. thanks
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
## 2019-05-28 NOTE — Telephone Encounter (Signed)
I highly advise they take the gun away. If it helps, they can tell him I requested this until his full work up is complete.

## 2019-05-28 NOTE — Telephone Encounter (Signed)
Called pt's daughter Manon Hilding (on [place]) and LVM asking for call back. Left office number and hours in message.

## 2019-05-29 NOTE — Telephone Encounter (Signed)
I called the pt's daughter and LVM asking for call back. Left office number in message. When daughter calls back, please give her Dr. Cathren Laine message as follows: I highly advise they take the gun away. If it helps, they can tell him I requested this until his full work up is complete.

## 2019-05-31 NOTE — Telephone Encounter (Signed)
I spoke with daughter [name] and discussed Dr. Cathren Laine message below. [name] verbalized understanding appreciation.

## 2019-06-04 MED FILL — CYANOCOBALAMIN 1,000 MCG/ML: 1000 | 30 days supply | Qty: 1 | Fill #0

## 2019-06-06 DIAGNOSIS — H401131 Primary open-angle glaucoma, bilateral, mild stage: Secondary | ICD-10-CM | POA: Diagnosis not present

## 2019-06-06 DIAGNOSIS — H524 Presbyopia: Secondary | ICD-10-CM | POA: Diagnosis not present

## 2019-06-06 DIAGNOSIS — Z961 Presence of intraocular lens: Secondary | ICD-10-CM | POA: Diagnosis not present

## 2019-06-12 ENCOUNTER — Other Ambulatory Visit: Payer: Self-pay | Admitting: Cardiovascular Disease

## 2019-06-13 ENCOUNTER — Other Ambulatory Visit: Payer: Self-pay | Admitting: Cardiovascular Disease

## 2019-06-13 ENCOUNTER — Encounter: Payer: Medicare Other | Attending: Psychology | Admitting: Psychology

## 2019-06-13 ENCOUNTER — Encounter: Payer: Self-pay | Admitting: Psychology

## 2019-06-13 ENCOUNTER — Other Ambulatory Visit: Payer: Self-pay

## 2019-06-13 DIAGNOSIS — R413 Other amnesia: Secondary | ICD-10-CM | POA: Diagnosis not present

## 2019-06-13 DIAGNOSIS — F0390 Unspecified dementia without behavioral disturbance: Secondary | ICD-10-CM | POA: Diagnosis not present

## 2019-06-13 DIAGNOSIS — R269 Unspecified abnormalities of gait and mobility: Secondary | ICD-10-CM | POA: Diagnosis not present

## 2019-06-13 DIAGNOSIS — Z820 Family history of epilepsy and other diseases of the nervous system: Secondary | ICD-10-CM | POA: Diagnosis not present

## 2019-06-13 MED FILL — LISINOPRIL 10 MG TABS: 10 | 90 days supply | Qty: 90 | Fill #0

## 2019-06-13 MED FILL — LEVOCETIRIZINE 5 MG TABLET: 5 | 30 days supply | Qty: 30 | Fill #3
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
## 2019-06-14 ENCOUNTER — Ambulatory Visit: Payer: Medicare Other | Admitting: Neurology

## 2019-06-26 MED FILL — CYANOCOBALAMIN 1,000 MCG/ML: 1000 | 30 days supply | Qty: 1 | Fill #1

## 2019-07-02 ENCOUNTER — Encounter: Payer: Medicare Other | Admitting: Psychology

## 2019-07-02 ENCOUNTER — Encounter: Payer: Self-pay | Admitting: Psychology

## 2019-07-02 ENCOUNTER — Other Ambulatory Visit: Payer: Self-pay

## 2019-07-02 DIAGNOSIS — F0390 Unspecified dementia without behavioral disturbance: Secondary | ICD-10-CM

## 2019-07-04 NOTE — Progress Notes (Addendum)
Neuropsychology Note  Jesus Harmon completed 240 minutes of neuropsychological testing with this provider.   Clinical Decision Making: In considering the patient's current level of functioning, level of presumed impairment, nature of symptoms, emotional and behavioral responses during the interview, level of literacy, and observed level of motivation/effort, a battery of tests was selected and administered.   Jesus Harmon will return on 08/02/19 for an interactive feedback session with Dr. Sima Matas at which time his test performances, clinical impressions and treatment recommendations will be reviewed in detail. The patient understands he can contact our office should he require our assistance before this time.  Mental Status/Behavioral Observations:    The patient arrived on time to his testing appointment. He was appropriately attired and well groomed. He ambulated independently without assistance but appeared to drift to the right side; impaired balance and coordination noted. He was was alert and fully oriented  His mood was slightly anxious but euthymic overall. He displayed good eye contact and openly answered all questions posed by the examiner. Psychomotor functioning was reduced. Rapport was good and easily established. Attention was poor. Speech was mostly clear, goal oriented, slow and deliberate, and prosodic with normal volume. Language was mostly fluent with some evidence of poor articulation. Moderate word finding difficulty was note. Thought processes appeared slight confused and disoriented but without evidence of psychotic symptoms such as delusions and/or hallucinations.  Insight and judgement seemed fair. He was cooperative with the evaluation and appeared to put forth his best effort.     Tests Administered: . Health and safety inspector . [place] (BNT) . Grooved Pegboard   . Hand Dynamometer  . Wechsler Adult Intelligence Scale, 4th Edition (WAIS-IV) . Wechsler  Memory Scale, 4th edition, Older Adult Battery (WMS-IV-OA)  Results:   Animal naming=10, T=31, 3%  Repetitions=1  Intrusions=2  BNT=39/60, T=35, 7%  Clock Drawing Test  WNL  Grooved Pegborard  Dominant=216", T=30, 2% (Drops=0)  Non-Dominant=291", T=30, 2% (Drops=1)  Hand Dynamometer  Dominant=29 Kg,   Non-Dominant=21 Kgc   Composite Score Summary  Scale Sum of Scaled Scores Composite Score Percentile Rank 95% Conf. Interval Qualitative Description  Verbal Comprehension 21 VCI 83 13 78-89 Low Average  Perceptual Reasoning 11 PRI 63 1 59-71 Extremely Low  Working Memory 10 WMI 71 3 66-80 Borderline  Processing Speed 13 PSI 81 10 75-91 Low Average  Full Scale 55 FSIQ 70 2 67-75 Borderline  General Ability 32 GAI 71 3 67-77 Borderline   Index Level Discrepancy Comparisons  Comparison Score 1 Score 2 Difference Critical Value .05 Significant Difference Y/N Base Rate by Overall Sample  VCI - PRI 83 63 20 8.82 Y 7.5  VCI - WMI 83 71 12 8.82 Y 17.7  VCI - PSI 83 81 2 9.29 N 46.2  PRI - WMI 63 71 -8 11.00 N 28.9  PRI - PSI 63 81 -18 11.38 Y 11.5  WMI - PSI 71 81 -10 11.38 N 25.3  FSIQ - GAI 70 71 -1 3.44 N 44.9     Differences Between Subtest and Overall Mean of Subtest Scores  Subtest Subtest Scaled Score Mean Scaled Score Difference Critical Value .05 Strength or Weakness Base Rate  Block Design 1 5.50 -4.50 2.85 W 2-5%  Similarities 5 5.50 -0.50 2.82  >25%  Digit Span 4 5.50 -1.50 2.22  >25%  Matrix Reasoning 5 5.50 -0.50 2.54  >25%  Vocabulary 7 5.50 1.50 2.03  >25%  Arithmetic 6 5.50 0.50 2.73  >25%  Symbol Search 7 5.50 1.50 3.42  >25%  Visual Puzzles 5 5.50 -0.50 2.71  >25%  Information 9 5.50 3.50 2.19 S 10%  Coding 6 5.50 0.50 2.97  >25%   Index Score Summary  Index Sum of Scaled Scores Index Score Percentile Rank 95% Confidence Interval Qualitative Descriptor  Auditory Memory (AMI) 35 93 32 87-100 Average  Visual Memory (VMI) 9 69 2 65-75  Extremely Low  Immediate Memory (IMI) 17 73 4 68-81 Borderline  Delayed Memory (DMI) 27 93 32 86-101 Average   Primary Subtest Scaled Score Summary  Subtest Domain Raw Score Scaled Score Percentile Rank  Logical Memory I AM 14 6 9   Logical Memory II AM 8 9 37  Verbal Paired Associates I AM 15 10 50  Verbal Paired Associates II AM 5 10 50  Visual Reproduction I VM 4 1 0.1  Visual Reproduction II VM 6 8 25   Symbol Span VWM 3 4 2     Auditory Memory Process Score Summary  Process Score Raw Score Scaled Score Percentile Rank Cumulative Percentage (Base Rate)  LM II Recognition 17 - - 26-50%  VPA II Recognition 29 - - >75%   Visual Memory Process Score Summary  Process Score Raw Score Scaled Score Percentile Rank Cumulative Percentage (Base Rate)  VR II Recognition 0 - - <=2%     ABILITY-MEMORY ANALYSIS  Ability Score:  VCI: 83 Date of Testing:  WAIS-IV; WMS-IV 2019/07/02  Predicted Difference Method   Index Predicted WMS-IV Index Score Actual WMS-IV Index Score Difference Critical Value  Significant Difference Y/N Base Rate  Auditory Memory 91 93 -2 8.95 N   Visual Memory 92 69 23 7.14 Y 5%  Immediate Memory 90 73 17 8.21 Y 5-10%  Delayed Memory 91 93 -2 11.18 N   Statistical significance (critical value) at the .01 level.   Contrast Scaled Scores  Score Score 1 Score 2 Contrast Scaled Score  General Ability Index vs. Auditory Memory Index 71 93 12  General Ability Index vs. Visual Memory Index 71 69 7  General Ability Index vs. Immediate Memory Index 71 73 8  General Ability Index vs. Delayed Memory Index 71 93 11  Verbal Comprehension Index vs. Auditory Memory Index 83 93 11  Perceptual Reasoning Index vs. Visual Memory Index 63 69 8  Working Memory Index vs. Auditory Memory Index 71 93 11    Full report to come.

## 2019-07-16 MED FILL — LEVOCETIRIZINE 5 MG TABLET: 5 | 30 days supply | Qty: 30 | Fill #4

## 2019-07-18 ENCOUNTER — Encounter: Payer: Self-pay | Admitting: Psychology

## 2019-07-18 ENCOUNTER — Encounter: Payer: Medicare Other | Attending: Psychology | Admitting: Psychology

## 2019-07-18 ENCOUNTER — Other Ambulatory Visit: Payer: Self-pay

## 2019-07-18 DIAGNOSIS — Z87891 Personal history of nicotine dependence: Secondary | ICD-10-CM | POA: Diagnosis not present

## 2019-07-18 DIAGNOSIS — F0390 Unspecified dementia without behavioral disturbance: Secondary | ICD-10-CM | POA: Insufficient documentation

## 2019-07-18 DIAGNOSIS — Z820 Family history of epilepsy and other diseases of the nervous system: Secondary | ICD-10-CM | POA: Diagnosis not present

## 2019-07-18 DIAGNOSIS — I6782 Cerebral ischemia: Secondary | ICD-10-CM

## 2019-07-18 DIAGNOSIS — F0280 Dementia in other diseases classified elsewhere without behavioral disturbance: Secondary | ICD-10-CM | POA: Diagnosis not present

## 2019-07-18 DIAGNOSIS — R269 Unspecified abnormalities of gait and mobility: Secondary | ICD-10-CM | POA: Diagnosis not present

## 2019-07-18 DIAGNOSIS — I2510 Atherosclerotic heart disease of native coronary artery without angina pectoris: Secondary | ICD-10-CM | POA: Insufficient documentation

## 2019-07-18 DIAGNOSIS — R413 Other amnesia: Secondary | ICD-10-CM | POA: Diagnosis not present

## 2019-07-18 NOTE — Progress Notes (Signed)
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 significant visual spatial impairments his pattern of memory deficits are clearly not consistent with a pattern typically seen with Alzheimer's type of dementia and the patient also shows patterns that are not consistent with Lewy body dementia and the patient has no indications of visual hallucinations or tremor.  The overall findings taking into account subjective symptoms reported by the patient and his family, imaging and medical data, as well as objective findings on the current neuropsychological evaluation are all consistent with a primary diagnostic consideration of dementia due to microvascular ischemic changes and subcortical microvascular ischemic occlusive disease and not 1 of a progressive cortical or subcortical dementia beyond cerebrovascular ischemic changes.  As far as recommendations, I do strongly recommend that the patient stop driving even though he is already limited his driving significantly.  I know this will be a hardship for him in many ways but the degree and level of visual-spatial impairments and reduced  information processing speed will increasingly make driving more hazardous to himself and others.  The patient will need to pay close attention to his overall health variables including nutritional and dietary patterns, sleep patterns and physical activity components.  The patient will likely need increasing assistance from his family with regard to day-to-day issues especially issues that  involve safety issues.  The patient is showing increasing fine motor control and gross motor control issues with recent falls or near falls and special precautions should be taken to avoid further falls as he will need to remain physically active as much as possible.  I will provide feedback to the patient and his family regarding the results of the current neuropsychological evaluation as well as make his report available to the referring neurologist as well as his PCP.  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 Neuropsychologist

## 2019-07-20 DIAGNOSIS — J309 Allergic rhinitis, unspecified: Secondary | ICD-10-CM | POA: Diagnosis not present

## 2019-07-20 DIAGNOSIS — Z0000 Encounter for general adult medical examination without abnormal findings: Secondary | ICD-10-CM | POA: Diagnosis not present

## 2019-07-20 DIAGNOSIS — I2510 Atherosclerotic heart disease of native coronary artery without angina pectoris: Secondary | ICD-10-CM | POA: Diagnosis not present

## 2019-07-20 DIAGNOSIS — E7800 Pure hypercholesterolemia, unspecified: Secondary | ICD-10-CM | POA: Diagnosis not present

## 2019-07-25 MED FILL — LATANOPROST 0.005% EYE DRP: 0.005 | 20 days supply | Qty: 3 | Fill #5

## 2019-07-26 MED FILL — CYANOCOBALAMIN 1,000 MCG/ML: 1000 | 30 days supply | Qty: 1 | Fill #2

## 2019-08-02 ENCOUNTER — Ambulatory Visit: Payer: Medicare Other | Admitting: Psychology

## 2019-08-06 MED FILL — SIMVASTATIN 20 MG TABLET: 20 | 90 days supply | Qty: 90 | Fill #1

## 2019-08-07 DIAGNOSIS — L03011 Cellulitis of right finger: Secondary | ICD-10-CM | POA: Diagnosis not present

## 2019-08-07 DIAGNOSIS — L603 Nail dystrophy: Secondary | ICD-10-CM | POA: Diagnosis not present

## 2019-08-07 DIAGNOSIS — Z85828 Personal history of other malignant neoplasm of skin: Secondary | ICD-10-CM | POA: Diagnosis not present

## 2019-08-07 DIAGNOSIS — L03032 Cellulitis of left toe: Secondary | ICD-10-CM | POA: Diagnosis not present

## 2019-08-07 MED FILL — TRIAMCINOLONE 0.1% CREAM: 0.1 | 30 days supply | Qty: 30 | Fill #0

## 2019-08-15 MED FILL — LEVOCETIRIZINE 5 MG TABLET: 5 | 30 days supply | Qty: 30 | Fill #5

## 2019-08-16 ENCOUNTER — Encounter: Payer: Medicare Other | Attending: Psychology | Admitting: Psychology

## 2019-08-16 ENCOUNTER — Other Ambulatory Visit: Payer: Self-pay

## 2019-08-16 DIAGNOSIS — R413 Other amnesia: Secondary | ICD-10-CM | POA: Diagnosis not present

## 2019-08-16 DIAGNOSIS — Z820 Family history of epilepsy and other diseases of the nervous system: Secondary | ICD-10-CM | POA: Insufficient documentation

## 2019-08-16 DIAGNOSIS — F0280 Dementia in other diseases classified elsewhere without behavioral disturbance: Secondary | ICD-10-CM

## 2019-08-16 DIAGNOSIS — F0390 Unspecified dementia without behavioral disturbance: Secondary | ICD-10-CM | POA: Insufficient documentation

## 2019-08-16 DIAGNOSIS — I6782 Cerebral ischemia: Secondary | ICD-10-CM

## 2019-08-16 DIAGNOSIS — R269 Unspecified abnormalities of gait and mobility: Secondary | ICD-10-CM | POA: Insufficient documentation

## 2019-08-24 MED FILL — LATANOPROST 0.005% EYE DRP: 0.005 | 25 days supply | Qty: 3 | Fill #0

## 2019-08-28 ENCOUNTER — Encounter: Payer: Self-pay | Admitting: Psychology

## 2019-08-28 NOTE — Progress Notes (Signed)
08/16/2019: Today's visit was an in person visit that was conducted in my outpatient clinic office.  Today we reviewed the results of the recent neuropsychological evaluation and went over the impressions and treatment implications and in-depth review of overall recommendations going forward.  The patient was able to adequately understand this information.  With regards to specific recommendations to completely stop driving the patient was very frustrated by this and I went over specific reasons and rationale behind this recommendation.  The patient did agree to stop driving even the limited driving that he is doing now.  Below you will find a copy of the summary of results and impression/diagnostic considerations from that formal evaluation.  It can be found in its entirety and the patient's EMR dated 07/18/2019.   Summary of Results:                        Overall, the results of the objective neuropsychological evaluation of a broad range of intellectual and cognitive domains do identify significant global cognitive impairments which suggest multiple areas of cognitive deficits rather than individual specific cognitive domains.  The patient shows generally well preserved areas of cognitive function with regard to his general fund of knowledge as well as his visual constructional abilities.  However, while the patient is able to show good planning and organization and construction of visual abilities the patient shows significant impairments with regard to visual analysis and organization, visual reasoning and problem solving abilities and visual estimation and judgment abilities.  The patient also showed significant weaknesses in deficits with regard to verbal reasoning and problem-solving abilities.  The patient showed mild deficits with regard to visual scanning and visual searching abilities as well as information processing speed and overall focus execute abilities.  As far as memory functions the patient  showed deficits for both auditory and visual encoding abilities which are complicating his ability to initially learned information.  There is a significant difference between initially learning visual information versus auditory information, however, I do think that the deficits for initial learning visual information is complicated and impacted by his significant visual-spatial deficits.  The patient's auditory memory functions, while below historical/premorbid levels, appeared to be well preserved overall and he is doing relatively well for learning new auditory information.  The patient also did very well on delayed memory functions for both auditory and visual components and did well on recognition formats but did require visual spatial capacity.  Overall, this pattern is not consistent with objective findings of severe impairments with regard to the ability to store and organize new information but the memory deficits do appear to be primarily related to retrieval of information rather than storage deficits.  Impression/Diagnosis:                     The results of the current neuropsychological evaluation do show objective findings consistent with the patient and his family subjective reports of cognitive deficits.  The patient's verbal fluency and challenge naming were impaired and there was a overall reduction in fine motor control and strength levels relative to age-matched comparison group.  There was no indication of lateralization and these motor deficits.  The patient shows a global reduction in overall cognitive functioning with the greatest deficits falling in the areas of visual-spatial and visual reasoning and problem-solving capacity.  The patient also shows issues with verbal reasoning and problem-solving as well as milder deficits and weaknesses relative to overall information processing  speed.  Objective memory functions strongly suggest that the patient is able to effectively store and  organize newly learned information both auditorily and visually although his visual-spatial deficits are having a negative impact on visual memory.  This information is available for recall after delay and he does well on recognition formats.  The deficits identified suggest that his memory deficits reported subjectively in the objective findings are consistent with impaired retrieval of information but that the patient is able to generally store and organize new information into memory stores.  As far as diagnostic considerations, this pattern is not consistent with patterns typically seen for degenerative dementia such as Alzheimer's dementia or Lewy body dementia other cortically mediated progressive dementia's.  The patient is also not showing any particular deficits consistent with subcortical dementias beyond a primary deficit of cerebrovascular changes and impairments.  The patient's neuro imaging and PET scan findings would also be consistent with these patterns both of which have identified significant changes in subcortical white matter tracts consistent with significant microvascular ischemic changes and small vessel disease impacting primarily white matter tracts.  While the patient has some significant visual spatial impairments his pattern of memory deficits are clearly not consistent with a pattern typically seen with Alzheimer's type of dementia and the patient also shows patterns that are not consistent with Lewy body dementia and the patient has no indications of visual hallucinations or tremor.  The overall findings taking into account subjective symptoms reported by the patient and his family, imaging and medical data, as well as objective findings on the current neuropsychological evaluation are all consistent with a primary diagnostic consideration of dementia due to microvascular ischemic changes and subcortical microvascular ischemic occlusive disease and not 1 of a progressive cortical or  subcortical dementia beyond cerebrovascular ischemic changes.  As far as recommendations, I do strongly recommend that the patient stop driving even though he is already limited his driving significantly.  I know this will be a hardship for him in many ways but the degree and level of visual-spatial impairments and reduced information processing speed will increasingly make driving more hazardous to himself and others.  The patient will need to pay close attention to his overall health variables including nutritional and dietary patterns, sleep patterns and physical activity components.  The patient will likely need increasing assistance from his family with regard to day-to-day issues especially issues that involve safety issues.  The patient is showing increasing fine motor control and gross motor control issues with recent falls or near falls and special precautions should be taken to avoid further falls as he will need to remain physically active as much as possible.  I will provide feedback to the patient and his family regarding the results of the current neuropsychological evaluation as well as make his report available to the referring neurologist as well as his PCP.  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

## 2019-08-30 MED FILL — tiZANidine HCL 2 MG TABS: 2 | 5 days supply | Qty: 30 | Fill #0

## 2019-09-05 MED FILL — LISINOPRIL 10 MG TABS: 10 | 90 days supply | Qty: 90 | Fill #1

## 2019-09-06 ENCOUNTER — Telehealth: Payer: Self-pay | Admitting: Neurology

## 2019-09-06 NOTE — Telephone Encounter (Signed)
Findings consistent with Vascular Dementia as we discussed during appointment. I can see patient in follow up but let them know this is something we already discussed at appointment, vascular dementia, confirmed by the testing and we can discuss at follow up more.

## 2019-09-06 NOTE — Telephone Encounter (Signed)
Daughter Fredderick Phenix. Called stating that Dr. Ferne Coe office informed her that the neurologist will get back with her to schedule a f/u for the Phycological evaluation. She would like to know if they are needing a f/u. Please advise.

## 2019-09-10 NOTE — Telephone Encounter (Signed)
I called the pt's daughter, [name], and LVM asking for call back.   Currently we have Wed 10/6 @ 2:00 pm available.

## 2019-09-11 NOTE — Telephone Encounter (Signed)
Pt's daughter called me back. We discussed the message from Dr Jaynee Eagles and scheduled pt for Wed 10/17/19 @ 2:00 pm. She verbalized appreciation for the call and her questions were answered.

## 2019-09-12 ENCOUNTER — Ambulatory Visit: Payer: Medicare Other | Admitting: Psychology

## 2019-09-13 DIAGNOSIS — M542 Cervicalgia: Secondary | ICD-10-CM | POA: Diagnosis not present

## 2019-09-13 MED FILL — DICLOFENAC SOD EC 75 MG TAB: 75 | 15 days supply | Qty: 30 | Fill #0

## 2019-09-21 MED FILL — CYANOCOBALAMIN 1,000 MCG/ML: 1000 | 30 days supply | Qty: 1 | Fill #3

## 2019-09-21 MED FILL — LEVOCETIRIZINE 5 MG TABLET: 5 | 30 days supply | Qty: 30 | Fill #0

## 2019-09-21 MED FILL — LATANOPROST 0.005% EYE DRP: 0.005 | 25 days supply | Qty: 3 | Fill #1

## 2019-09-26 DIAGNOSIS — I2510 Atherosclerotic heart disease of native coronary artery without angina pectoris: Secondary | ICD-10-CM | POA: Diagnosis not present

## 2019-09-26 DIAGNOSIS — R1032 Left lower quadrant pain: Secondary | ICD-10-CM | POA: Diagnosis not present

## 2019-09-28 ENCOUNTER — Ambulatory Visit: Payer: Self-pay | Admitting: General Surgery

## 2019-09-28 ENCOUNTER — Telehealth: Payer: Self-pay | Admitting: *Deleted

## 2019-09-28 DIAGNOSIS — K4091 Unilateral inguinal hernia, without obstruction or gangrene, recurrent: Secondary | ICD-10-CM | POA: Diagnosis not present

## 2019-09-28 NOTE — Telephone Encounter (Signed)
[name] was last seen in clinic 04/11/19. Hx of CAD s/p DES to RCA in 2018. Known residual disease mod prox LAD which was unable to be treated medically and has been managed medically. No changes were made at that time.   Will route note to primary cardiologist, Dr. Burt Knack, for recommendations regarding holding aspirin prior to lap inguinal repair with mesh.   Loel Dubonnet, NP

## 2019-09-28 NOTE — H&P (Signed)
  History of Present Illness Ralene Ok MD; 09/28/2019 11:55 AM) The patient is a 86 year old male who presents with an inguinal hernia. Chief Complaint: Recurrent left inguinal hernia  Patient is an 86 year old male with a history of CAD, hypertension, with a recurrent left inguinal hernia. Patient underwent open left inguinal hernia repair by Dr. Grandville Silos in 2018. Patient's had several urgent office/nurse only visits with incarceration of hernia. Patient comes in today secondary to incarceration. He's had significant pain and. He had a hernia truss bought by his daughter.  Patient has had a significant CAD. He sees Dr. Burt Knack his cardiologist. Patient currently not on any antiplatelet medication.     Review of Systems Ralene Ok, MD; 09/28/2019 11:57 AM) All other systems negative   Physical Exam Ralene Ok MD; 09/28/2019 11:56 AM) The physical exam findings are as follows: Note: Constitutional: No acute distress, conversant, appears stated age  Eyes: Anicteric sclerae, moist conjunctiva, no lid lag  Neck: No thyromegaly, trachea midline, no cervical lymphadenopathy  Lungs: Clear to auscultation biilaterally, normal respiratory effot  Cardiovascular: regular rate & rhythm, no murmurs, no peripheal edema, pedal pulses 2+  GI: Soft, no masses or hepatosplenomegaly, non-tender to palpation  MSK: Normal gait, no clubbing cyanosis, edema  Skin: No rashes, palpation reveals normal skin turgor  Psychiatric: Appropriate judgment and insight, oriented to person, place, and time  Abdomen Inspection Hernias - Left - Inguinal hernia - Reducible - Left.    Assessment & Plan Ralene Ok MD; 09/28/2019 11:56 AM) RECURRENT LEFT INGUINAL HERNIA (K40.91) Impression: Patient is an 86 year old male with a recurrent left inguinal hernia, history of CAD We'll obtain cardiac clearance by Dr. Burt Knack. 1. 1. The patient will like to proceed to the operating room for  laparoscopic versus open left inguinal hernia repair with mesh.  2. I discussed with the patient the signs and symptoms of incarceration and strangulation and the need to proceed to the ER should they occur.  3. I discussed with the patient the risks and benefits of the procedure to include but not limited to: Infection, bleeding, damage to surrounding structures, possible need for further surgery, possible nerve pain, and possible recurrence. The patient was understanding and wishes to proceed.

## 2019-09-28 NOTE — Telephone Encounter (Signed)
   [facility] Pre-operative Risk Assessment    HEARTCARE STAFF: - Please ensure there is not already an duplicate clearance open for this procedure. - Under Visit Info/Reason for Call, type in Other and utilize the format Clearance MM/DD/YY or Clearance TBD. Do not use dashes or single digits. - If request is for dental extraction, please clarify the # of teeth to be extracted.  Request for surgical clearance:  1. What type of surgery is being performed? Lap inguinal repair with mesh   2. When is this surgery scheduled? TBD   3. What type of clearance is required (medical clearance vs. Pharmacy clearance to hold med vs. Both)? Both  4. Are there any medications that need to be held prior to surgery and how long?Asa 79m   5. Practice name and name of physician performing surgery? [facility]; Dr ARalene Ok  6. What is the office phone number? [phone]  7.   What is the office fax number? [phone] 8.   Anesthesia type (None, local, MAC, general) ? General   Jesus Harmon L 09/28/2019, 3:26 PM  _________________________________________________________________   (provider comments below)

## 2019-09-28 NOTE — H&P (View-Only) (Signed)
  History of Present Illness Ralene Ok MD; 09/28/2019 11:55 AM) The patient is a 86 year old male who presents with an inguinal hernia. Chief Complaint: Recurrent left inguinal hernia  Patient is an 86 year old male with a history of CAD, hypertension, with a recurrent left inguinal hernia. Patient underwent open left inguinal hernia repair by Dr. Grandville Silos in 2018. Patient's had several urgent office/nurse only visits with incarceration of hernia. Patient comes in today secondary to incarceration. He's had significant pain and. He had a hernia truss bought by his daughter.  Patient has had a significant CAD. He sees Dr. Burt Knack his cardiologist. Patient currently not on any antiplatelet medication.     Review of Systems Ralene Ok, MD; 09/28/2019 11:57 AM) All other systems negative   Physical Exam Ralene Ok MD; 09/28/2019 11:56 AM) The physical exam findings are as follows: Note: Constitutional: No acute distress, conversant, appears stated age  Eyes: Anicteric sclerae, moist conjunctiva, no lid lag  Neck: No thyromegaly, trachea midline, no cervical lymphadenopathy  Lungs: Clear to auscultation biilaterally, normal respiratory effot  Cardiovascular: regular rate & rhythm, no murmurs, no peripheal edema, pedal pulses 2+  GI: Soft, no masses or hepatosplenomegaly, non-tender to palpation  MSK: Normal gait, no clubbing cyanosis, edema  Skin: No rashes, palpation reveals normal skin turgor  Psychiatric: Appropriate judgment and insight, oriented to person, place, and time  Abdomen Inspection Hernias - Left - Inguinal hernia - Reducible - Left.    Assessment & Plan Ralene Ok MD; 09/28/2019 11:56 AM) RECURRENT LEFT INGUINAL HERNIA (K40.91) Impression: Patient is an 86 year old male with a recurrent left inguinal hernia, history of CAD We'll obtain cardiac clearance by Dr. Burt Knack. 1. 1. The patient will like to proceed to the operating room for  laparoscopic versus open left inguinal hernia repair with mesh.  2. I discussed with the patient the signs and symptoms of incarceration and strangulation and the need to proceed to the ER should they occur.  3. I discussed with the patient the risks and benefits of the procedure to include but not limited to: Infection, bleeding, damage to surrounding structures, possible need for further surgery, possible nerve pain, and possible recurrence. The patient was understanding and wishes to proceed.

## 2019-09-30 NOTE — Telephone Encounter (Signed)
OK to proceed. At low risk of holding ASA x 5 days before surgery if necessary. thanks

## 2019-10-01 NOTE — Telephone Encounter (Signed)
   Primary Cardiologist: Sherren Mocha, MD  Chart reviewed as part of pre-operative protocol coverage. Patient was contacted 10/01/2019 in reference to pre-operative risk assessment for pending surgery as outlined below.  Jesus Harmon was last seen on 04/11/19 by Robbie Lis PA-C with history of CAD s/p DES to RCA 2018 with residual disease managed medically, LE edema, HTN, HLD, dementia. 2D echo 2020 LVEF 55-60%. I spoke with patient and wife who report no new cardiac symptoms. They requested I call daughter [name] who also affirms he has not had any recent CP or SOB. Therefore, based on ACC/AHA guidelines, the patient would be at acceptable risk for the planned procedure without further cardiovascular testing.   Per Dr. Burt Knack, "OK to proceed. At low risk of holding ASA x 5 days before surgery if necessary."  The patient was advised that if he develops new symptoms prior to surgery to contact our office to arrange for a follow-up visit, and he verbalized understanding.  I will route this recommendation to the requesting party via Epic fax function and remove from pre-op pool. Please call with questions.  Charlie Pitter, PA-C 10/01/2019, 10:57 AM

## 2019-10-01 NOTE — Telephone Encounter (Signed)
Daughter of the patient called to check on the status of the surgical clearance.

## 2019-10-03 MED FILL — tiZANidine HCL 2 MG TABS: 2 | 5 days supply | Qty: 30 | Fill #0

## 2019-10-03 MED FILL — DICLOFENAC SOD EC 75 MG TAB: 75 | 15 days supply | Qty: 30 | Fill #0

## 2019-10-04 NOTE — Progress Notes (Signed)
[place], [place] - [address] [facility]. [address] [place] [address] Phone: [phone] Fax: [phone]      Your procedure is scheduled on Tuesday 10/09/2019.  Report to [facility] Entrance "A" at 12:00 P.M., and check in at the Admitting office.  Call this number if you have problems or questions between now and the morning of surgery:  [phone]    Remember:  Do not eat after midnight the night before your surgery  You may drink clear liquids until 11:00am the morning of your surgery.   Clear liquids allowed are: Water, Non-Citrus Juices (without pulp), Carbonated Beverages, Clear Tea, Black Coffee Only, and Gatorade  Please complete your PRE-SURGERY ENSURE that was provided to you by 11:00am the morning of surgery.  Please, if able, drink it in one sitting. DO NOT SIP.     There are no medications you need to take the morning of your surgery.   The morning of surgery if you need to take your Nitroglycerin (nitrostat) for chest pain, you may do so     As of today, STOP taking any Diclofenac (Voltaren), Aleve, Naproxen, Ibuprofen, Motrin, Advil, Goody's, BC's, all herbal medications, fish oil, and all vitamins.   Per Dr. Burt Knack, Hold Aspirin for 5 days prior to surgery.                      Do not wear jewelry            Do not wear lotions, powders, colognes, or deodorant.            Men may shave face and neck.            Do not bring valuables to the hospital.            [facility] is not responsible for any belongings or valuables.  Do NOT Smoke (Tobacco/Vaping) or drink Alcohol 24 hours prior to your procedure  If you use a CPAP at night, you may bring all equipment for your overnight stay.   Contacts, glasses, dentures or bridgework may not be worn into surgery.      For patients admitted to the hospital, discharge time will be determined by your treatment team.   Patients discharged the day of surgery  will not be allowed to drive home, and someone needs to stay with them for 24 hours.    Special instructions:   [place]- Preparing For Surgery  Before surgery, you can play an important role. Because skin is not sterile, your skin needs to be as free of germs as possible. You can reduce the number of germs on your skin by washing with CHG (chlorahexidine gluconate) Soap before surgery.  CHG is an antiseptic cleaner which kills germs and bonds with the skin to continue killing germs even after washing.    Oral Hygiene is also important to reduce your risk of infection.  Remember - BRUSH YOUR TEETH THE MORNING OF SURGERY WITH YOUR REGULAR TOOTHPASTE  Please do not use if you have an allergy to CHG or antibacterial soaps. If your skin becomes reddened/irritated stop using the CHG.  Do not shave (including legs and underarms) for at least 48 hours prior to first CHG shower. It is OK to shave your face.  Please follow these instructions carefully.   1. Shower the NIGHT BEFORE SURGERY and the MORNING OF SURGERY with CHG Soap.   2. If you chose to  wash your hair, wash your hair first as usual with your normal shampoo.  3. After you shampoo, rinse your hair and body thoroughly to remove the shampoo.  4. Use CHG as you would any other liquid soap. You can apply CHG directly to the skin and wash gently with a scrungie or a clean washcloth.   5. Apply the CHG Soap to your body ONLY FROM THE NECK DOWN.  Do not use on open wounds or open sores. Avoid contact with your eyes, ears, mouth and genitals (private parts). Wash Face and genitals (private parts)  with your normal soap.   6. Wash thoroughly, paying special attention to the area where your surgery will be performed.  7. Thoroughly rinse your body with warm water from the neck down.  8. DO NOT shower/wash with your normal soap after using and rinsing off the CHG Soap.  9. Pat yourself dry with a CLEAN TOWEL.  10. Wear CLEAN PAJAMAS to  bed the night before surgery  11. Place CLEAN SHEETS on your bed the night of your first shower and DO NOT SLEEP WITH PETS.   Day of Surgery: Shower with CHG soap as directed Wear Clean/Comfortable clothing the morning of surgery Do not apply any deodorants/lotions.   Remember to brush your teeth WITH YOUR REGULAR TOOTHPASTE.   Please read over the following fact sheets that you were given.

## 2019-10-05 ENCOUNTER — Encounter (HOSPITAL_COMMUNITY): Payer: Self-pay

## 2019-10-05 ENCOUNTER — Encounter (HOSPITAL_COMMUNITY)
Admission: RE | Admit: 2019-10-05 | Discharge: 2019-10-05 | Disposition: A | Discharge status: Home / Self Care | Payer: Medicare Other | Source: Ambulatory Visit | Attending: General Surgery | Admitting: General Surgery

## 2019-10-05 ENCOUNTER — Other Ambulatory Visit: Payer: Self-pay

## 2019-10-05 ENCOUNTER — Other Ambulatory Visit (HOSPITAL_COMMUNITY)
Admission: RE | Admit: 2019-10-05 | Discharge: 2019-10-05 | Disposition: A | Discharge status: Home / Self Care | Payer: Medicare Other | Source: Ambulatory Visit | Attending: General Surgery | Admitting: General Surgery

## 2019-10-05 DIAGNOSIS — Z20822 Contact with and (suspected) exposure to covid-19: Secondary | ICD-10-CM | POA: Insufficient documentation

## 2019-10-05 DIAGNOSIS — Z01812 Encounter for preprocedural laboratory examination: Secondary | ICD-10-CM | POA: Insufficient documentation

## 2019-10-05 LAB — BASIC METABOLIC PANEL
Anion gap: 10 (ref 5–15)
BUN: 16 mg/dL (ref 8–23)
CO2: 28 mmol/L (ref 22–32)
Calcium: 9.7 mg/dL (ref 8.9–10.3)
Chloride: 101 mmol/L (ref 98–111)
Creatinine, Ser: 1.11 mg/dL (ref 0.61–1.24)
GFR calc Af Amer: >60 mL/min (ref >60)
GFR calc non Af Amer: 59 mL/min — ABNORMAL LOW (ref >60)
Glucose, Bld: 102 mg/dL — ABNORMAL HIGH (ref 70–99)
Potassium: 4 mmol/L (ref 3.5–5.1)
Sodium: 139 mmol/L (ref 135–145)

## 2019-10-05 LAB — CBC
HCT: 33.1 % — ABNORMAL LOW (ref 39.0–52.0)
Hemoglobin: 9.9 g/dL — ABNORMAL LOW (ref 13.0–17.0)
MCH: 28.3 pg (ref 26.0–34.0)
MCHC: 29.9 g/dL — ABNORMAL LOW (ref 30.0–36.0)
MCV: 94.6 fL (ref 80.0–100.0)
Platelets: 418 10*3/uL — ABNORMAL HIGH (ref 150–400)
RBC: 3.5 MIL/uL — ABNORMAL LOW (ref 4.22–5.81)
RDW: 13.1 % (ref 11.5–15.5)
WBC: 10.3 10*3/uL (ref 4.0–10.5)
nRBC: 0 % (ref 0.0–0.2)

## 2019-10-05 LAB — SARS CORONAVIRUS 2 (TAT 6-24 HRS): SARS Coronavirus 2: NEGATIVE

## 2019-10-05 NOTE — Progress Notes (Signed)
[place], [place] - [address] [facility]. [address] [place] [address] Phone: [phone] Fax: [phone]      Your procedure is scheduled on Tuesday 10/09/2019.  Report to [facility] Main Entrance "A" at 7:00 A.M., and check in at the Admitting office.  Call this number if you have problems or questions between now and the morning of surgery:  [phone]    Remember:  Do not eat after midnight the night before your surgery  You may drink clear liquids until 6:00 am the morning of your surgery.   Clear liquids allowed are: Water, Non-Citrus Juices (without pulp), Carbonated Beverages, Clear Tea, Black Coffee Only, and Gatorade  Please complete your PRE-SURGERY ENSURE that was provided to you by 6:00am the morning of surgery.  Please, if able, drink it in one sitting. DO NOT SIP.     There are no medications you need to take the morning of your surgery.   The morning of surgery if you need to take your Nitroglycerin (nitrostat) for chest pain, you may do so     As of today, STOP taking any Diclofenac (Voltaren), Aleve, Naproxen, Ibuprofen, Motrin, Advil, Goody's, BC's, all herbal medications, fish oil, and all vitamins.   Per Dr. Burt Knack, Hold Aspirin for 5 days prior to surgery.                      Do not wear jewelry            Do not wear lotions, powders, colognes, or deodorant.            Men may shave face and neck.            Do not bring valuables to the hospital.            [facility] is not responsible for any belongings or valuables.  Do NOT Smoke (Tobacco/Vaping) or drink Alcohol 24 hours prior to your procedure  If you use a CPAP at night, you may bring all equipment for your overnight stay.   Contacts, glasses, dentures or bridgework may not be worn into surgery.      For patients admitted to the hospital, discharge time will be determined by your treatment team.   Patients discharged the day of surgery  will not be allowed to drive home, and someone needs to stay with them for 24 hours.    Special instructions:   [place]- Preparing For Surgery  Before surgery, you can play an important role. Because skin is not sterile, your skin needs to be as free of germs as possible. You can reduce the number of germs on your skin by washing with CHG (chlorahexidine gluconate) Soap before surgery.  CHG is an antiseptic cleaner which kills germs and bonds with the skin to continue killing germs even after washing.    Oral Hygiene is also important to reduce your risk of infection.  Remember - BRUSH YOUR TEETH THE MORNING OF SURGERY WITH YOUR REGULAR TOOTHPASTE  Please do not use if you have an allergy to CHG or antibacterial soaps. If your skin becomes reddened/irritated stop using the CHG.  Do not shave (including legs and underarms) for at least 48 hours prior to first CHG shower. It is OK to shave your face.  Please follow these instructions carefully.   1. Shower the NIGHT BEFORE SURGERY and the MORNING OF SURGERY with CHG Soap.   2. If you chose  to wash your hair, wash your hair first as usual with your normal shampoo.  3. After you shampoo, rinse your hair and body thoroughly to remove the shampoo.  4. Use CHG as you would any other liquid soap. You can apply CHG directly to the skin and wash gently with a scrungie or a clean washcloth.   5. Apply the CHG Soap to your body ONLY FROM THE NECK DOWN.  Do not use on open wounds or open sores. Avoid contact with your eyes, ears, mouth and genitals (private parts). Wash Face and genitals (private parts)  with your normal soap.   6. Wash thoroughly, paying special attention to the area where your surgery will be performed.  7. Thoroughly rinse your body with warm water from the neck down.  8. DO NOT shower/wash with your normal soap after using and rinsing off the CHG Soap.  9. Pat yourself dry with a CLEAN TOWEL.  10. Wear CLEAN PAJAMAS to  bed the night before surgery  11. Place CLEAN SHEETS on your bed the night of your first shower and DO NOT SLEEP WITH PETS.   Day of Surgery: Shower with CHG soap as directed Wear Clean/Comfortable clothing the morning of surgery Do not apply any deodorants/lotions.   Remember to brush your teeth WITH YOUR REGULAR TOOTHPASTE.   Please read over the following fact sheets that you were given.

## 2019-10-05 NOTE — Progress Notes (Signed)
PCP - Dr. Donnie Coffin Cardiologist - Dr. Sherren Mocha Neurologist: Dr. Sarina Ill  PPM/ICD - Denies  Chest x-ray - N/A EKG -  04/11/19 Stress Test - 05/2016 ECHO - 02/2018 Cardiac Cath - 2018  Sleep Study - Denies  Pt denies being diabetic.  Blood Thinner Instructions: N/A Aspirin Instructions: Per Dr. Burt Knack, hold ASA 5 days prior to surgery. LD: 10/04/19  ERAS Protcol - Yes PRE-SURGERY Ensure - Yes  COVID TEST- 10/05/19 @ 10:30 AM   Anesthesia review: Yes, cardiac hx  Patient denies shortness of breath, fever, cough and chest pain at PAT appointment   All instructions explained to the patient, with a verbal understanding of the material. Patient agrees to go over the instructions while at home for a better understanding. Patient also instructed to self quarantine after being tested for COVID-19. The opportunity to ask questions was provided.

## 2019-10-08 NOTE — Progress Notes (Signed)
Anesthesia Chart Review:  Case: [id number] Date/Time: 10/09/19 0715   Procedure: LAPAROSCOPIC VS OPEN LEFT INGUINAL HERNIA REPAIR WITH MESH (Left )   Anesthesia type: General   Pre-op diagnosis: RECURRENT LEFT INGUINAL HERNIA   Location: [place] OR ROOM 08 / [place] [facility]   Surgeons: Ralene Ok, MD      DISCUSSION: Patient is an 86 year old male scheduled for the above procedure.  History includes former smoker (quit 01/11/61), CAD (s/p DES RCA 06/16/16 with unsuccessful attempt at PTCA proximal LAD with FFR 0.72 due to rigid lesion and inability to expand balloon; consider atherectomy/stenting should he develop refractory angina), HTN, HLD, glaucoma, bladder "cancer"/BPH (s/p TURBT and TURP 03/04/14: pathology: no malignancy), skin cancer (s/p BCC excision), left inguinal hernia repair (12/22/16). Recent neurologic testing felt consistent with "Vascular Dementia" and is followed by Dr. Jaynee Eagles.  Preoperative cardiology input outlined by Melina Copa, PA-C on 10/01/19, "DEMON VOLANTE was last seen on 04/11/19 by Robbie Lis PA-C with history of CAD s/p DES to RCA 2018 with residual disease managed medically, LE edema, HTN, HLD, dementia. 2D echo 2020 LVEF 55-60%. I spoke with patient and wife who report no new cardiac symptoms. They requested I call daughter [name] who also affirms he has not had any recent CP or SOB. Therefore, based on ACC/AHA guidelines, the patient would be at acceptable risk for the planned procedure without further cardiovascular testing.   Per Dr. Burt Knack, 'OK to proceed. At low risk of holding ASA x 5 days before surgery if necessary.'"  Last ASA dose 10/04/19.     Preoperative HGB 9.9. Currently comparison labs available are from ~ 2018/2020 which show a HGB in the ~ 10-11 range. CBC results routed to Dr. Rosendo Gros.   Preoperative COVID-19 test negative on 10/05/2019.  Anesthesia team to evaluate on the day of surgery.   VS: BP (!) 151/67   Pulse 60   Temp 36.6 C   Resp 18   Ht 5\' 11"   (1.803 m)   Wt 64.1 kg   SpO2 100%   BMI 19.72 kg/m    PROVIDERS: Mitchell, L.Marlou Sa, MD is PCP  Sherren Mocha, MD is cardiologist Sarina Ill, MD is neurologist. Visit on 05/10/19 for memory and balance problems.  He was referred for formal memory testing with Ilean Skill, PsyD and a FDG PET Scan was ordered (to evaluate for vascular dementia versus Alzheimer's). Scan showed no specificc findings to suggest Alzheimer's or frontotemporal dementia   LABS: Preoperative labs noted. HGB 9.9, previously HGB ~ 10-11. See DISCUSSION. (all labs ordered are listed, but only abnormal results are displayed)  Labs Reviewed  BASIC METABOLIC PANEL - Abnormal; Notable for the following components:      Result Value   Glucose, Bld 102 (*)    GFR calc non Af Amer 59 (*)    All other components within normal limits  CBC - Abnormal; Notable for the following components:   RBC 3.50 (*)    Hemoglobin 9.9 (*)    HCT 33.1 (*)    MCHC 29.9 (*)    Platelets 418 (*)    All other components within normal limits     IMAGES: NM PET Metabolic Brain 10/04/24: IMPRESSION: 1. No specific findings identified to suggest Alzheimer's or frontotemporal dementia. 2. Advanced changes of chronic small vessel ischemic disease with brain atrophy.   EKG: 04/11/19: SB at 56 bpm   CV: Echo 02/24/18: IMPRESSIONS  1. The left ventricle has normal systolic function, with an ejection  fraction of 55-60%. The cavity size was normal. Left ventricular diastolic  Doppler parameters are consistent with pseudonormalization No evidence of  left ventricular regional wall  motion abnormalities.  2. The right ventricle has normal systolic function. The cavity was  normal. There is no increase in right ventricular wall thickness.  3. The mitral valve is normal in structure. No evidence of mitral valve  stenosis. Trivial regurgitation.  4. The tricuspid valve is normal in structure.  5. The aortic valve is  tricuspid Mild calcification of the aortic valve.  no stenosis of the aortic valve.  6. The pulmonic valve was normal in structure.  7. The inferior vena cava was dilated in size with >50% respiratory  variability.  8. Right atrial pressure is estimated at 8 mmHg.  9. PA systolic pressure 34 mmHg.    Cardiac cath 06/16/16: 1. Severe stenosis of the mid right coronary artery treated successfully with PTCA and stenting using a 4.0 x 16 mm Synergy DES 2. Severe proximal LAD stenosis confirmed by FFR analysis (0.72) but unsuccessful angioplasty because of inability to expand a balloon in this rigid lesion 3. Widely patent left main and left circumflex 4. Normal LV function by noninvasive assessment Recommendation: The patient will be observed overnight. Will continue dual antiplatelet therapy with aspirin and clopidogrel for 12 months. Consider atherectomy and PCI of the LAD lesion in a few weeks after reassessment of the patient in the outpatient setting.   Carotid US 02/04/14: IMPRESSION: Less than 50% stenosis in the right and left internal carotid arteries.   Past Medical History:  Diagnosis Date  . Arthritis    back and neck (06/16/2016)  . Bladder cancer ([place])   . Blockage of coronary artery of heart (HCC)    "widow maker", had stenting of the R side but L main is still blocked   . CAD (coronary artery disease)    a. LHC 10/2009:  pLAD 50%, small D1 70-80% (too small for PCI), EF 65% => med Rx  b. Myoview 10/2009: EF 65%, no ischemia;  c.  ETT-MV 3/14:  No ischemia, EF 63%  . Cancer [facility])    sin cancer removed nose   . Diverticulosis   . Glaucoma   . History of echocardiogram    Echo 02/2018: EF 17-40, +diastolic dysfunction, no RWMA, normal RVSF, trivial MR  . History of kidney stones    "passed them"  . HLD (hyperlipidemia)    "on RX; never had high cholestrol" (06/16/2016)  . HTN (hypertension)    "on RX; never HTN" (06/16/2016)  . Migraine    "in high school" (06/16/2016)     Past Surgical History:  Procedure Laterality Date  . BASAL CELL CARCINOMA EXCISION     "one of my ears; big one on my nose; one on my left shoulder" (06/16/2016)  . CARDIAC CATHETERIZATION  2017   "treated w/RX"  . CATARACT EXTRACTION W/ INTRAOCULAR LENS  IMPLANT, BILATERAL Bilateral   . CORONARY ANGIOPLASTY WITH STENT PLACEMENT  06/16/2016  . CORONARY BALLOON ANGIOPLASTY N/A 06/16/2016   Procedure: Coronary Balloon Angioplasty;  Surgeon: Sherren Mocha, MD;  Location: [place] CV LAB;  Service: Cardiovascular;  Laterality: N/A;  Prox LAD  . CORONARY STENT INTERVENTION  06/16/2016   Procedure: Coronary Stent Intervention;  Surgeon: Sherren Mocha, MD;  Location: [place] [facility] LAB;  Service: Cardiovascular;;  Synergy 4.0x16 to Mid RCA  . INGUINAL HERNIA REPAIR Left 12/22/2016   Procedure: REPAIR OF LEFT INGUINAL HERNIA WITH MESH;  Surgeon: Georganna Skeans, MD;  Location: [facility];  Service: General;  Laterality: Left;  . INSERTION OF MESH Left 12/22/2016   Procedure: INSERTION OF MESH;  Surgeon: Georganna Skeans, MD;  Location: [facility];  Service: General;  Laterality: Left;  . INTRAVASCULAR PRESSURE WIRE/FFR STUDY N/A 06/16/2016   Procedure: Intravascular Pressure Wire/FFR Study;  Surgeon: Sherren Mocha, MD;  Location: [place] CV LAB;  Service: Cardiovascular;  Laterality: N/A;  Prox LAD  . KNEE ARTHROSCOPY     "not sure which side"  . LEFT HEART CATH AND CORONARY ANGIOGRAPHY N/A 06/16/2016   Procedure: Left Heart Cath and Coronary Angiography;  Surgeon: Sherren Mocha, MD;  Location: [place] [facility] LAB;  Service: Cardiovascular;  Laterality: N/A;  . TONSILLECTOMY    . TRANSURETHRAL RESECTION OF PROSTATE N/A 03/04/2014   Procedure: TRANSURETHRAL RESECTION OF THE PROSTATE, TRANSURETHRAL RESECTION OF THE BLADDER TUMOR;  Surgeon: Ailene Rud, MD;  Location: [facility];  Service: Urology;  Laterality: N/A;    MEDICATIONS: . aspirin EC 81 MG tablet  . cyanocobalamin (,VITAMIN B-12,)  1000 MCG/ML injection  . diclofenac (VOLTAREN) 75 MG EC tablet  . latanoprost (XALATAN) 0.005 % ophthalmic solution  . levocetirizine (XYZAL) 5 MG tablet  . lisinopril (ZESTRIL) 10 MG tablet  . nitroGLYCERIN (NITROSTAT) 0.4 MG SL tablet  . simvastatin (ZOCOR) 20 MG tablet  . SYRINGE-NEEDLE, DISP, 3 ML 23G X 1" 3 ML MISC  . tiZANidine (ZANAFLEX) 2 MG tablet  . vitamin B-12 (CYANOCOBALAMIN) 1000 MCG tablet   No current facility-administered medications for this encounter.    Myra Gianotti, PA-C Surgical Short Stay/Anesthesiology [facility] Phone [phone] [facility] Phone [phone] 10/08/2019 1:11 PM

## 2019-10-08 NOTE — Anesthesia Preprocedure Evaluation (Addendum)
Anesthesia Evaluation  Patient identified by MRN, date of birth, ID band Patient awake    Reviewed: Allergy & Precautions, NPO status , Patient's Chart, lab work & pertinent test results  History of Anesthesia Complications Negative for: history of anesthetic complications  Airway Mallampati: III  TM Distance: >3 FB Neck ROM: Limited    Dental  (+) Dental Advisory Given, Teeth Intact   Pulmonary former smoker,    Pulmonary exam normal        Cardiovascular hypertension, Pt. on medications + CAD and + Cardiac Stents  Normal cardiovascular exam   '20 TTE - EF 55-60%. LV diastolic Doppler parameters are consistent with pseudonormalization. Trivial MR.  '18 Cath - Severe stenosis of the mid right coronary artery treated successfully with PTCA and stenting using a 4.0 x 16 mm Synergy DES 2. Severe proximal LAD stenosis confirmed by FFR analysis (0.72) but unsuccessful angioplasty because of inability to expand a balloon in this rigid lesion 3. Widely patent left main and left circumflex 4. Normal LV function by noninvasive assessment    Neuro/Psych  Headaches, PSYCHIATRIC DISORDERS Dementia    GI/Hepatic negative GI ROS, Neg liver ROS,   Endo/Other  negative endocrine ROS  Renal/GU negative Renal ROS    Bladder mass s/p TURBT S/p TURP     Musculoskeletal  (+) Arthritis ,   Abdominal   Peds  Hematology  (+) anemia ,   Anesthesia Other Findings Covid test negative See PAT note and cardiac clearance   Reproductive/Obstetrics                          Anesthesia Physical Anesthesia Plan  ASA: III  Anesthesia Plan: General   Post-op Pain Management:    Induction: Intravenous  PONV Risk Score and Plan: 2 and Treatment may vary due to age or medical condition, Ondansetron and Propofol infusion  Airway Management Planned: Oral ETT  Additional Equipment: None  Intra-op Plan:    Post-operative Plan: Extubation in OR  Informed Consent: I have reviewed the patients History and Physical, chart, labs and discussed the procedure including the risks, benefits and alternatives for the proposed anesthesia with the patient or authorized representative who has indicated his/her understanding and acceptance.     Dental advisory given  Plan Discussed with: CRNA and Anesthesiologist  Anesthesia Plan Comments:       Anesthesia Quick Evaluation

## 2019-10-09 ENCOUNTER — Encounter (HOSPITAL_COMMUNITY)
Admission: RE | Disposition: A | Discharge status: Home / Self Care | Payer: Self-pay | Source: Home / Self Care | Attending: General Surgery

## 2019-10-09 ENCOUNTER — Ambulatory Visit (HOSPITAL_COMMUNITY): Payer: Medicare Other | Admitting: Anesthesiology

## 2019-10-09 ENCOUNTER — Ambulatory Visit (HOSPITAL_COMMUNITY)
Admission: RE | Admit: 2019-10-09 | Discharge: 2019-10-09 | Disposition: A | Discharge status: Home / Self Care | Payer: Medicare Other | Attending: General Surgery | Admitting: General Surgery

## 2019-10-09 ENCOUNTER — Other Ambulatory Visit: Payer: Self-pay

## 2019-10-09 ENCOUNTER — Encounter (HOSPITAL_COMMUNITY): Payer: Self-pay | Admitting: General Surgery

## 2019-10-09 DIAGNOSIS — M1990 Unspecified osteoarthritis, unspecified site: Secondary | ICD-10-CM | POA: Diagnosis not present

## 2019-10-09 DIAGNOSIS — Z8719 Personal history of other diseases of the digestive system: Secondary | ICD-10-CM

## 2019-10-09 DIAGNOSIS — I2510 Atherosclerotic heart disease of native coronary artery without angina pectoris: Secondary | ICD-10-CM | POA: Insufficient documentation

## 2019-10-09 DIAGNOSIS — D176 Benign lipomatous neoplasm of spermatic cord: Secondary | ICD-10-CM | POA: Diagnosis not present

## 2019-10-09 DIAGNOSIS — K4091 Unilateral inguinal hernia, without obstruction or gangrene, recurrent: Secondary | ICD-10-CM | POA: Insufficient documentation

## 2019-10-09 DIAGNOSIS — R519 Headache, unspecified: Secondary | ICD-10-CM | POA: Diagnosis not present

## 2019-10-09 DIAGNOSIS — Z955 Presence of coronary angioplasty implant and graft: Secondary | ICD-10-CM | POA: Diagnosis not present

## 2019-10-09 DIAGNOSIS — Z87891 Personal history of nicotine dependence: Secondary | ICD-10-CM | POA: Insufficient documentation

## 2019-10-09 DIAGNOSIS — I25118 Atherosclerotic heart disease of native coronary artery with other forms of angina pectoris: Secondary | ICD-10-CM | POA: Diagnosis not present

## 2019-10-09 DIAGNOSIS — F0390 Unspecified dementia without behavioral disturbance: Secondary | ICD-10-CM | POA: Insufficient documentation

## 2019-10-09 DIAGNOSIS — D649 Anemia, unspecified: Secondary | ICD-10-CM | POA: Insufficient documentation

## 2019-10-09 DIAGNOSIS — Z9889 Other specified postprocedural states: Secondary | ICD-10-CM

## 2019-10-09 DIAGNOSIS — I10 Essential (primary) hypertension: Secondary | ICD-10-CM | POA: Insufficient documentation

## 2019-10-09 DIAGNOSIS — E782 Mixed hyperlipidemia: Secondary | ICD-10-CM | POA: Diagnosis not present

## 2019-10-09 HISTORY — PX: INGUINAL HERNIA REPAIR: SHX194

## 2019-10-09 SURGERY — REPAIR, HERNIA, INGUINAL, LAPAROSCOPIC
Anesthesia: General | Site: Inguinal | Laterality: Left

## 2019-10-09 MED ORDER — TRAMADOL HCL 50 MG PO TABS: 50.0000 mg | ORAL_TABLET | Freq: Four times a day (QID) | ORAL | 0 refills | Status: DC | PRN

## 2019-10-09 MED ORDER — SUGAMMADEX SODIUM 200 MG/2ML IV SOLN
INTRAVENOUS | Status: DC | PRN
  Administered 2019-10-09: 200 mg via INTRAVENOUS

## 2019-10-09 MED ORDER — ONDANSETRON HCL 4 MG/2ML IJ SOLN: 4.0000 mg | Freq: Once | INTRAMUSCULAR | Status: DC | PRN

## 2019-10-09 MED ORDER — CHLORHEXIDINE GLUCONATE CLOTH 2 % EX PADS: 6.0000 | MEDICATED_PAD | Freq: Once | CUTANEOUS | Status: DC

## 2019-10-09 MED ORDER — ROCURONIUM BROMIDE 10 MG/ML (PF) SYRINGE
PREFILLED_SYRINGE | INTRAVENOUS | Status: DC | PRN
  Administered 2019-10-09: 40 mg via INTRAVENOUS

## 2019-10-09 MED ORDER — CHLORHEXIDINE GLUCONATE 0.12 % MT SOLN
OROMUCOSAL | Status: AC
  Administered 2019-10-09: 15 mL via OROMUCOSAL
  Filled 2019-10-09: qty 15

## 2019-10-09 MED ORDER — PROPOFOL 10 MG/ML IV BOLUS
INTRAVENOUS | Status: AC
  Filled 2019-10-09: qty 20

## 2019-10-09 MED ORDER — LACTATED RINGERS IV SOLN: INTRAVENOUS | Status: DC

## 2019-10-09 MED ORDER — OXYCODONE HCL 5 MG PO TABS: 5.0000 mg | ORAL_TABLET | Freq: Once | ORAL | Status: DC | PRN

## 2019-10-09 MED ORDER — LIDOCAINE 2% (20 MG/ML) 5 ML SYRINGE
INTRAMUSCULAR | Status: DC | PRN
  Administered 2019-10-09: 40 mg via INTRAVENOUS

## 2019-10-09 MED ORDER — OXYCODONE HCL 5 MG/5ML PO SOLN: 5.0000 mg | Freq: Once | ORAL | Status: DC | PRN

## 2019-10-09 MED ORDER — MIDAZOLAM HCL 2 MG/2ML IJ SOLN
INTRAMUSCULAR | Status: AC
  Filled 2019-10-09: qty 2

## 2019-10-09 MED ORDER — VANCOMYCIN HCL IN DEXTROSE 1-5 GM/200ML-% IV SOLN
INTRAVENOUS | Status: AC
  Filled 2019-10-09: qty 200

## 2019-10-09 MED ORDER — FENTANYL CITRATE (PF) 100 MCG/2ML IJ SOLN
INTRAMUSCULAR | Status: DC | PRN
Start: 2019-10-09 — End: 2019-10-09
  Administered 2019-10-09: 100 ug via INTRAVENOUS
  Administered 2019-10-09: 50 ug via INTRAVENOUS

## 2019-10-09 MED ORDER — FENTANYL CITRATE (PF) 100 MCG/2ML IJ SOLN
25.0000 ug | INTRAMUSCULAR | Status: DC | PRN
  Administered 2019-10-09 (×2): 25 ug via INTRAVENOUS

## 2019-10-09 MED ORDER — FENTANYL CITRATE (PF) 100 MCG/2ML IJ SOLN
INTRAMUSCULAR | Status: AC
  Filled 2019-10-09: qty 2

## 2019-10-09 MED ORDER — ENSURE PRE-SURGERY PO LIQD: 296.0000 mL | Freq: Once | ORAL | Status: DC

## 2019-10-09 MED ORDER — VANCOMYCIN HCL IN DEXTROSE 1-5 GM/200ML-% IV SOLN
1000.0000 mg | INTRAVENOUS | Status: AC
  Administered 2019-10-09: 1000 mg via INTRAVENOUS

## 2019-10-09 MED ORDER — LIDOCAINE 2% (20 MG/ML) 5 ML SYRINGE
INTRAMUSCULAR | Status: AC
  Filled 2019-10-09: qty 5

## 2019-10-09 MED ORDER — PROPOFOL 10 MG/ML IV BOLUS
INTRAVENOUS | Status: DC | PRN
  Administered 2019-10-09: 100 mg via INTRAVENOUS

## 2019-10-09 MED ORDER — ACETAMINOPHEN 500 MG PO TABS: 1000.0000 mg | ORAL_TABLET | ORAL | Status: AC

## 2019-10-09 MED ORDER — PROPOFOL 500 MG/50ML IV EMUL
INTRAVENOUS | Status: DC | PRN
  Administered 2019-10-09: 50 ug/kg/min via INTRAVENOUS

## 2019-10-09 MED ORDER — BUPIVACAINE HCL (PF) 0.25 % IJ SOLN
INTRAMUSCULAR | Status: AC
  Filled 2019-10-09: qty 30

## 2019-10-09 MED ORDER — 0.9 % SODIUM CHLORIDE (POUR BTL) OPTIME
TOPICAL | Status: DC | PRN
  Administered 2019-10-09: 1000 mL

## 2019-10-09 MED ORDER — CHLORHEXIDINE GLUCONATE 0.12 % MT SOLN: 15.0000 mL | Freq: Once | OROMUCOSAL | Status: AC

## 2019-10-09 MED ORDER — BUPIVACAINE HCL 0.25 % IJ SOLN
INTRAMUSCULAR | Status: DC | PRN
  Administered 2019-10-09: 6 mL

## 2019-10-09 MED ORDER — FENTANYL CITRATE (PF) 250 MCG/5ML IJ SOLN
INTRAMUSCULAR | Status: AC
Start: 2019-10-09 — End: ?
  Filled 2019-10-09: qty 5

## 2019-10-09 MED ORDER — ACETAMINOPHEN 500 MG PO TABS
ORAL_TABLET | ORAL | Status: AC
  Administered 2019-10-09: 1000 mg via ORAL
  Filled 2019-10-09: qty 2

## 2019-10-09 MED ORDER — ORAL CARE MOUTH RINSE: 15.0000 mL | Freq: Once | OROMUCOSAL | Status: AC

## 2019-10-09 MED ORDER — ROCURONIUM BROMIDE 10 MG/ML (PF) SYRINGE
PREFILLED_SYRINGE | INTRAVENOUS | Status: AC
  Filled 2019-10-09: qty 10

## 2019-10-09 MED FILL — traMADol HCL 50 MG TABS: 50 | 5 days supply | Qty: 20 | Fill #0

## 2019-10-09 SURGICAL SUPPLY — 41 items
ADH SKN CLS APL DERMABOND .7 (GAUZE/BANDAGES/DRESSINGS) ×1
CANISTER SUCT 3000ML PPV (MISCELLANEOUS) IMPLANT
COVER SURGICAL LIGHT HANDLE (MISCELLANEOUS) ×2 IMPLANT
COVER WAND RF STERILE (DRAPES) IMPLANT
DEFOGGER SCOPE WARMER CLEARIFY (MISCELLANEOUS) IMPLANT
DERMABOND ADVANCED (GAUZE/BANDAGES/DRESSINGS) ×1
DERMABOND ADVANCED .7 DNX12 (GAUZE/BANDAGES/DRESSINGS) ×1 IMPLANT
DISSECTOR BLUNT TIP ENDO 5MM (MISCELLANEOUS) IMPLANT
ELECT REM PT RETURN 9FT ADLT (ELECTROSURGICAL) ×2
ELECTRODE REM PT RTRN 9FT ADLT (ELECTROSURGICAL) ×1 IMPLANT
GLOVE BIO SURGEON STRL SZ7.5 (GLOVE) ×2 IMPLANT
GOWN STRL REUS W/ TWL LRG LVL3 (GOWN DISPOSABLE) ×2 IMPLANT
GOWN STRL REUS W/ TWL XL LVL3 (GOWN DISPOSABLE) ×1 IMPLANT
GOWN STRL REUS W/TWL LRG LVL3 (GOWN DISPOSABLE) ×4
GOWN STRL REUS W/TWL XL LVL3 (GOWN DISPOSABLE) ×2
KIT BASIN OR (CUSTOM PROCEDURE TRAY) ×2 IMPLANT
KIT TURNOVER KIT B (KITS) ×2 IMPLANT
MESH 3DMAX 4X6 LT LRG (Mesh General) ×1 IMPLANT
NDL INSUFFLATION 14GA 120MM (NEEDLE) IMPLANT
NEEDLE INSUFFLATION 14GA 120MM (NEEDLE) IMPLANT
NS IRRIG 1000ML POUR BTL (IV SOLUTION) ×2 IMPLANT
PAD ARMBOARD 7.5X6 YLW CONV (MISCELLANEOUS) ×4 IMPLANT
RELOAD STAPLE 4.8 BLK F/HERNIA (STAPLE) IMPLANT
RELOAD STAPLE HERNIA 4.0 BLUE (INSTRUMENTS) ×2 IMPLANT
RELOAD STAPLE HERNIA 4.8 BLK (STAPLE) IMPLANT
SCISSORS LAP 5X35 DISP (ENDOMECHANICALS) ×2 IMPLANT
SET IRRIG TUBING LAPAROSCOPIC (IRRIGATION / IRRIGATOR) IMPLANT
SET TUBE SMOKE EVAC HIGH FLOW (TUBING) ×2 IMPLANT
STAPLER HERNIA 12 8.5 360D (INSTRUMENTS) ×2 IMPLANT
SUT MNCRL AB 4-0 PS2 18 (SUTURE) ×2 IMPLANT
SUT VIC AB 1 CT1 27 (SUTURE)
SUT VIC AB 1 CT1 27XBRD ANBCTR (SUTURE) IMPLANT
SYRINGE TOOMEY DISP (SYRINGE) ×2 IMPLANT
TOWEL GREEN STERILE (TOWEL DISPOSABLE) ×2 IMPLANT
TOWEL GREEN STERILE FF (TOWEL DISPOSABLE) ×2 IMPLANT
TRAY FOLEY W/BAG SLVR 14FR (SET/KITS/TRAYS/PACK) ×2 IMPLANT
TRAY LAPAROSCOPIC MC (CUSTOM PROCEDURE TRAY) ×2 IMPLANT
TROCAR OPTICAL SHORT 5MM (TROCAR) ×2 IMPLANT
TROCAR OPTICAL SLV SHORT 5MM (TROCAR) ×2 IMPLANT
TROCAR XCEL 12X100 BLDLESS (ENDOMECHANICALS) ×2 IMPLANT
WATER STERILE IRR 1000ML POUR (IV SOLUTION) ×2 IMPLANT

## 2019-10-09 NOTE — Discharge Instructions (Signed)
[facility] _______[facility] °INGUINAL HERNIA REPAIR: POST OP INSTRUCTIONS ° °Always review your discharge instruction sheet given to you by the facility where your surgery was performed. °IF YOU HAVE DISABILITY OR FAMILY LEAVE FORMS, YOU MUST BRING THEM TO THE OFFICE FOR PROCESSING.   °DO NOT GIVE THEM TO YOUR DOCTOR. ° °1. A  prescription for pain medication may be given to you upon discharge.  Take your pain medication as prescribed, if needed.  If narcotic pain medicine is not needed, then you may take acetaminophen (Tylenol) or ibuprofen (Advil) as needed. °2. Take your usually prescribed medications unless otherwise directed. °If you need a refill on your pain medication, please contact your pharmacy.  They will contact our office to request authorization. Prescriptions will not be filled after 5 pm or on week-ends. °3. You should follow a light diet the first 24 hours after arrival home, such as soup and crackers, etc.  Be sure to include lots of fluids daily.  Resume your normal diet the day after surgery. °4.Most patients will experience some swelling and bruising around the umbilicus or in the groin and scrotum.  Ice packs and reclining will help.  Swelling and bruising can take several days to resolve.  °6. It is common to experience some constipation if taking pain medication after surgery.  Increasing fluid intake and taking a stool softener (such as Colace) will usually help or prevent this problem from occurring.  A mild laxative (Milk of Magnesia or Miralax) should be taken according to package directions if there are no bowel movements after 48 hours. °7. Unless discharge instructions indicate otherwise, you may remove your bandages 24-48 hours after surgery, and you may shower at that time.  You may have steri-strips (small skin tapes) in place directly over the incision.  These strips should be left on the skin for 7-10 days.  If your surgeon used skin glue on the incision, you may  shower in 24 hours.  The glue will flake off over the next 2-3 weeks.  Any sutures or staples will be removed at the office during your follow-up visit. °8. ACTIVITIES:  You may resume regular (light) daily activities beginning the next day--such as daily self-care, walking, climbing stairs--gradually increasing activities as tolerated.  You may have sexual intercourse when it is comfortable.  Refrain from any heavy lifting or straining until approved by your doctor. ° °a.You may drive when you are no longer taking prescription pain medication, you can comfortably wear a seatbelt, and you can safely maneuver your car and apply brakes. °b.RETURN TO WORK:   °_____________________________________________ ° °9.You should see your doctor in the office for a follow-up appointment approximately 2-3 weeks after your surgery.  Make sure that you call for this appointment within a day or two after you arrive home to insure a convenient appointment time. °10.OTHER INSTRUCTIONS: _________________________ °   _____________________________________ ° °WHEN TO CALL YOUR DOCTOR: °1. Fever over 101.0 °2. Inability to urinate °3. Nausea and/or vomiting °4. Extreme swelling or bruising °5. Continued bleeding from incision. °6. Increased pain, redness, or drainage from the incision ° °The clinic staff is available to answer your questions during regular business hours.  Please don’t hesitate to call and ask to speak to one of the nurses for clinical concerns.  If you have a medical emergency, go to the nearest emergency room or call 911.  A surgeon from [facility] is always on call at the hospital ° ° °[address], [address], [place], [place]  [address] ? ° [address] [address], [place], [place]   [address] °[phone] ? [phone] ? FAX [phone] °Web site: [email or web address] ° °

## 2019-10-09 NOTE — Op Note (Signed)
10/09/2019  8:05 AM  PATIENT:  Jesus Harmon  86 y.o. male  PRE-OPERATIVE DIAGNOSIS:  RECURRENT LEFT INGUINAL HERNIA  POST-OPERATIVE DIAGNOSIS:  RECURRENT LEFT INGUINAL HERNIA  PROCEDURE:  Procedure(s): LAPAROSCOPIC LEFT INGUINAL HERNIA REPAIR WITH MESH (Left)  SURGEON:  Surgeon(s) and Role:    * Ralene Ok, MD - Primary  ASSISTANTS: Ivin Booty hitchcock, RNFA   ANESTHESIA:   local and general  EBL:  minimal   BLOOD ADMINISTERED:none  DRAINS: none   LOCAL MEDICATIONS USED:  BUPIVICAINE   SPECIMEN:  No Specimen  DISPOSITION OF SPECIMEN:  N/A  COUNTS:  YES  TOURNIQUET:  * No tourniquets in log *  DICTATION: .Dragon Dictation Counts: reported as correct x 2   Findings:  The patient had a moderate sized left indirect hernia and a moderated sized cord lipoma   Indications for procedure:  The patient is a 86 year old male with a left inguinal hernia recurrence and episodes of incarceration.The patient was taken back for urgent inguinal hernia repair.   Details of the procedure: The patient was taken back to the operating room. The patient was placed in supine position with bilateral SCDs in place.  The patient was prepped and draped in the usual sterile fashion.  After appropriate anitbiotics were confirmed, a time-out was confirmed and all facts were verified.   0.25% Marcaine was used to infiltrate the umbilical area. A 11-blade was used to cut down the skin and blunt dissection was used to get the anterior fashion.  The anterior fascia was incised approximately 1 cm and the muscles were retracted laterally. Blunt dissection was then used to create a space in the preperitoneal area. At this time a 10 mm camera was then introduced into the space and advanced the pubic tubercle and a 12 mm trocar was placed over this and insufflation was started.  At this time and space was created from medial to laterally the preperitoneal space.  Cooper's ligament was initially cleaned  off.  The hernia sac was identified in the indirect space. Dissection of the hernia sac and cord structures was undertaken the vas deferens was identified and protected in all parts of the case.    Once the hernia sac was taken down to approximately the umbilicus a Left Bard 3D Max mesh, size: Large, was  introduced into the preperitoneal space.  The mesh was brought over to cover the direct and indirect hernia spaces.  This was anchored into place and secured to Cooper's ligament with 4.25mm staples from a Coviden hernia stapler. It was anchored to the anterior abdominal wall with 4.8 mm staples. The hernia sac was seen lying posterior to the mesh. There was no staples placed laterally. The insufflation was evacuated and the peritoneum was seen posterior to the mesh. The trochars were removed. The anterior fascia was reapproximated using #1 Vicryl on a UR- 6.  Intra-abdominal air was evacuated and the Veress needle removed. The skin was reapproximated using 4-0 Monocryl subcuticular fashion and Dermabond. The patient was awakened from general anesthesia and taken to recovery in stable condition.   PLAN OF CARE: Discharge to home after PACU  PATIENT DISPOSITION:  PACU - hemodynamically stable.   Delay start of Pharmacological VTE agent (>24hrs) due to surgical blood loss or risk of bleeding: not applicable

## 2019-10-09 NOTE — Anesthesia Postprocedure Evaluation (Signed)
Anesthesia Post Note  Patient: CRYSTAL ELLWOOD  Procedure(s) Performed: LAPAROSCOPIC LEFT INGUINAL HERNIA REPAIR WITH MESH (Left Inguinal)     Patient location during evaluation: PACU Anesthesia Type: General Level of consciousness: awake, oriented and confused (Oriented, but some confusion likely due to narcotic/residual anesthetic) Pain management: pain level controlled Vital Signs Assessment: post-procedure vital signs reviewed and stable Respiratory status: spontaneous breathing, nonlabored ventilation and respiratory function stable Cardiovascular status: blood pressure returned to baseline and stable Postop Assessment: no apparent nausea or vomiting Anesthetic complications: no   No complications documented.  Last Vitals:  Vitals:   10/09/19 0851 10/09/19 0921  BP: (!) 164/90 (!) 146/62  Pulse: 80 85  Resp: (!) 23 (!) 26  Temp:    SpO2: 100% 100%    Last Pain:  Vitals:   10/09/19 0850  TempSrc:   PainSc: 0-No pain                 Audry Pili

## 2019-10-09 NOTE — Anesthesia Procedure Notes (Signed)
Procedure Name: Intubation Date/Time: 10/09/2019 7:33 AM Performed by: Moshe Salisbury, CRNA Pre-anesthesia Checklist: Patient identified, Emergency Drugs available, Suction available and Patient being monitored Patient Re-evaluated:Patient Re-evaluated prior to induction Oxygen Delivery Method: Circle System Utilized Preoxygenation: Pre-oxygenation with 100% oxygen Induction Type: IV induction Ventilation: Mask ventilation without difficulty Laryngoscope Size: Mac and 4 Grade View: Grade III Tube type: Oral Tube size: 8.0 mm Number of attempts: 1 Airway Equipment and Method: Stylet Placement Confirmation: ETT inserted through vocal cords under direct vision,  positive ETCO2 and breath sounds checked- equal and bilateral Secured at: 23 cm Tube secured with: Tape Dental Injury: Teeth and Oropharynx as per pre-operative assessment  Difficulty Due To: Difficulty was anticipated and Difficult Airway- due to reduced neck mobility

## 2019-10-09 NOTE — Interval H&P Note (Signed)
History and Physical Interval Note:  10/09/2019 7:16 AM  Jesus Harmon  has presented today for surgery, with the diagnosis of RECURRENT LEFT INGUINAL HERNIA.  The various methods of treatment have been discussed with the patient and family. After consideration of risks, benefits and other options for treatment, the patient has consented to  Procedure(s): LAPAROSCOPIC VS OPEN LEFT INGUINAL HERNIA REPAIR WITH MESH (Left) as a surgical intervention.  The patient's history has been reviewed, patient examined, no change in status, stable for surgery.  I have reviewed the patient's chart and labs.  Questions were answered to the patient's satisfaction.     Ralene Ok

## 2019-10-09 NOTE — Transfer of Care (Signed)
Immediate Anesthesia Transfer of Care Note  Patient: Jesus Harmon  Procedure(s) Performed: LAPAROSCOPIC LEFT INGUINAL HERNIA REPAIR WITH MESH (Left Inguinal)  Patient Location: PACU  Anesthesia Type:General  Level of Consciousness: awake and patient cooperative  Airway & Oxygen Therapy: Patient Spontanous Breathing and Patient connected to nasal cannula oxygen  Post-op Assessment: Report given to RN and Post -op Vital signs reviewed and stable  Post vital signs: Reviewed and stable  Last Vitals:  Vitals Value Taken Time  BP 146/80 10/09/19 0821  Temp    Pulse 86 10/09/19 0822  Resp 23 10/09/19 0822  SpO2 100 % 10/09/19 0822  Vitals shown include unvalidated device data.  Last Pain:  Vitals:   10/09/19 0623  TempSrc:   PainSc: 10-Worst pain ever      Patients Stated Pain Goal: 2 (48/01/65 [id number])  Complications: No complications documented.

## 2019-10-10 ENCOUNTER — Encounter (HOSPITAL_COMMUNITY): Payer: Self-pay | Admitting: General Surgery

## 2019-10-15 ENCOUNTER — Other Ambulatory Visit (HOSPITAL_COMMUNITY): Payer: Self-pay | Admitting: Ophthalmology

## 2019-10-15 DIAGNOSIS — H401131 Primary open-angle glaucoma, bilateral, mild stage: Secondary | ICD-10-CM | POA: Diagnosis not present

## 2019-10-15 MED FILL — LATANOPROST 0.005% EYE DRP: 0.005 | 25 days supply | Qty: 3 | Fill #0

## 2019-10-15 MED FILL — LEVOCETIRIZINE 5 MG TABLET: 5 | 30 days supply | Qty: 30 | Fill #1

## 2019-10-17 ENCOUNTER — Other Ambulatory Visit: Payer: Self-pay

## 2019-10-17 ENCOUNTER — Encounter: Payer: Self-pay | Admitting: Neurology

## 2019-10-17 ENCOUNTER — Other Ambulatory Visit: Payer: Self-pay | Admitting: Neurology

## 2019-10-17 ENCOUNTER — Ambulatory Visit: Payer: Medicare Other | Admitting: Neurology

## 2019-10-17 VITALS — BP 147/62 | HR 63 | Ht 71.0 in | Wt 139.0 lb

## 2019-10-17 DIAGNOSIS — F01518 Vascular dementia, unspecified severity, with other behavioral disturbance: Secondary | ICD-10-CM

## 2019-10-17 DIAGNOSIS — F0151 Vascular dementia with behavioral disturbance: Secondary | ICD-10-CM | POA: Diagnosis not present

## 2019-10-17 MED ORDER — MEMANTINE HCL 10 MG PO TABS: ORAL_TABLET | ORAL | 6 refills | Status: DC

## 2019-10-17 MED FILL — MEMANTINE HCL 10 MG TABLET: 10 | 30 days supply | Qty: 60 | Fill #0

## 2019-10-17 NOTE — Patient Instructions (Signed)
Start Namenda. Start 10mg  in the morning. In 2 weeks give 10mg  twice daily If Dr. Burt Knack approved we can start Aricept (email me in a few months and we can start prior to appointment Try Melatonin 10mg  an hour before bedtime    Memantine Tablets What is this medicine? MEMANTINE (MEM an teen) is used to treat dementia caused by Alzheimer's disease. This medicine may be used for other purposes; ask your health care provider or pharmacist if you have questions. COMMON BRAND NAME(S): Namenda What should I tell my health care provider before I take this medicine? They need to know if you have any of these conditions:  difficulty passing urine  kidney disease  liver disease  seizures  an unusual or allergic reaction to memantine, other medicines, foods, dyes, or preservatives  pregnant or trying to get pregnant  breast-feeding How should I use this medicine? Take this medicine by mouth with a glass of water. Follow the directions on the prescription label. You may take this medicine with or without food. Take your doses at regular intervals. Do not take your medicine more often than directed. Continue to take your medicine even if you feel better. Do not stop taking except on the advice of your doctor or health care professional. Talk to your pediatrician regarding the use of this medicine in children. Special care may be needed. Overdosage: If you think you have taken too much of this medicine contact a poison control center or emergency room at once. NOTE: This medicine is only for you. Do not share this medicine with others. What if I miss a dose? If you miss a dose, take it as soon as you can. If it is almost time for your next dose, take only that dose. Do not take double or extra doses. If you do not take your medicine for several days, contact your health care provider. Your dose may need to be changed. What may interact with this  medicine?  acetazolamide  amantadine  cimetidine  dextromethorphan  dofetilide  hydrochlorothiazide  ketamine  metformin  methazolamide  quinidine  ranitidine  sodium bicarbonate  triamterene This list may not describe all possible interactions. Give your health care provider a list of all the medicines, herbs, non-prescription drugs, or dietary supplements you use. Also tell them if you smoke, drink alcohol, or use illegal drugs. Some items may interact with your medicine. What should I watch for while using this medicine? Visit your doctor or health care professional for regular checks on your progress. Check with your doctor or health care professional if there is no improvement in your symptoms or if they get worse. You may get drowsy or dizzy. Do not drive, use machinery, or do anything that needs mental alertness until you know how this drug affects you. Do not stand or sit up quickly, especially if you are an older patient. This reduces the risk of dizzy or fainting spells. Alcohol can make you more drowsy and dizzy. Avoid alcoholic drinks. What side effects may I notice from receiving this medicine? Side effects that you should report to your doctor or health care professional as soon as possible:  allergic reactions like skin rash, itching or hives, swelling of the face, lips, or tongue  agitation or a feeling of restlessness  depressed mood  dizziness  hallucinations  redness, blistering, peeling or loosening of the skin, including inside the mouth  seizures  vomiting Side effects that usually do not require medical attention (report to your  doctor or health care professional if they continue or are bothersome):  constipation  diarrhea  headache  nausea  trouble sleeping This list may not describe all possible side effects. Call your doctor for medical advice about side effects. You may report side effects to FDA at 1-800-FDA-1088. Where should I  keep my medicine? Keep out of the reach of children. Store at room temperature between 15 degrees and 30 degrees C (59 degrees and 86 degrees F). Throw away any unused medicine after the expiration date. NOTE: This sheet is a summary. It may not cover all possible information. If you have questions about this medicine, talk to your doctor, pharmacist, or health care provider.  2020 [employer]/Gold Standard (2012-10-16 14:10:42)  Donepezil tablets What is this medicine? DONEPEZIL (doe NEP e zil) is used to treat mild to moderate dementia caused by Alzheimer's disease. This medicine may be used for other purposes; ask your health care provider or pharmacist if you have questions. COMMON BRAND NAME(S): Aricept What should I tell my health care provider before I take this medicine? They need to know if you have any of these conditions:  asthma or other lung disease  difficulty passing urine  head injury  heart disease  history of irregular heartbeat  liver disease  seizures (convulsions)  stomach or intestinal disease, ulcers or stomach bleeding  an unusual or allergic reaction to donepezil, other medicines, foods, dyes, or preservatives  pregnant or trying to get pregnant  breast-feeding How should I use this medicine? Take this medicine by mouth with a glass of water. Follow the directions on the prescription label. You may take this medicine with or without food. Take this medicine at regular intervals. This medicine is usually taken before bedtime. Do not take it more often than directed. Continue to take your medicine even if you feel better. Do not stop taking except on your doctor's advice. If you are taking the 23 mg donepezil tablet, swallow it whole; do not cut, crush, or chew it. Talk to your pediatrician regarding the use of this medicine in children. Special care may be needed. Overdosage: If you think you have taken too much of this medicine contact a poison control  center or emergency room at once. NOTE: This medicine is only for you. Do not share this medicine with others. What if I miss a dose? If you miss a dose, take it as soon as you can. If it is almost time for your next dose, take only that dose, do not take double or extra doses. What may interact with this medicine? Do not take this medicine with any of the following medications:  certain medicines for fungal infections like itraconazole, fluconazole, posaconazole, and voriconazole  cisapride  dextromethorphan; quinidine  dronedarone  pimozide  quinidine  thioridazine This medicine may also interact with the following medications:  antihistamines for allergy, cough and cold  atropine  bethanechol  carbamazepine  certain medicines for bladder problems like oxybutynin, tolterodine  certain medicines for Parkinson's disease like benztropine, trihexyphenidyl  certain medicines for stomach problems like dicyclomine, hyoscyamine  certain medicines for travel sickness like scopolamine  dexamethasone  dofetilide  ipratropium  NSAIDs, medicines for pain and inflammation, like ibuprofen or naproxen  other medicines for Alzheimer's disease  other medicines that prolong the QT interval (cause an abnormal heart rhythm)  phenobarbital  phenytoin  rifampin, rifabutin or rifapentine  ziprasidone This list may not describe all possible interactions. Give your health care provider a list of all  the medicines, herbs, non-prescription drugs, or dietary supplements you use. Also tell them if you smoke, drink alcohol, or use illegal drugs. Some items may interact with your medicine. What should I watch for while using this medicine? Visit your doctor or health care professional for regular checks on your progress. Check with your doctor or health care professional if your symptoms do not get better or if they get worse. You may get drowsy or dizzy. Do not drive, use machinery, or  do anything that needs mental alertness until you know how this drug affects you. What side effects may I notice from receiving this medicine? Side effects that you should report to your doctor or health care professional as soon as possible:  allergic reactions like skin rash, itching or hives, swelling of the face, lips, or tongue  feeling faint or lightheaded, falls  loss of bladder control  seizures  signs and symptoms of a dangerous change in heartbeat or heart rhythm like chest pain; dizziness; fast or irregular heartbeat; palpitations; feeling faint or lightheaded, falls; breathing problems  signs and symptoms of infection like fever or chills; cough; sore throat; pain or trouble passing urine  signs and symptoms of liver injury like dark yellow or brown urine; general ill feeling or flu-like symptoms; light-colored stools; loss of appetite; nausea; right upper belly pain; unusually weak or tired; yellowing of the eyes or skin  slow heartbeat or palpitations  unusual bleeding or bruising  vomiting Side effects that usually do not require medical attention (report to your doctor or health care professional if they continue or are bothersome):  diarrhea, especially when starting treatment  headache  loss of appetite  muscle cramps  nausea  stomach upset This list may not describe all possible side effects. Call your doctor for medical advice about side effects. You may report side effects to FDA at 1-800-FDA-1088. Where should I keep my medicine? Keep out of reach of children. Store at room temperature between 15 and 30 degrees C (59 and 86 degrees F). Throw away any unused medicine after the expiration date. NOTE: This sheet is a summary. It may not cover all possible information. If you have questions about this medicine, talk to your doctor, pharmacist, or health care provider.  2020 [employer]/Gold Standard (2017-12-19 10:33:41)
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
## 2019-10-18 ENCOUNTER — Ambulatory Visit: Payer: No Typology Code available for payment source | Admitting: Podiatry

## 2019-10-20 DIAGNOSIS — Z23 Encounter for immunization: Secondary | ICD-10-CM | POA: Diagnosis not present

## 2019-10-29 MED FILL — SIMVASTATIN 20 MG TABLET: 20 | 90 days supply | Qty: 90 | Fill #2

## 2019-11-05 ENCOUNTER — Encounter: Payer: Self-pay | Admitting: Podiatry

## 2019-11-05 ENCOUNTER — Ambulatory Visit: Payer: Medicare Other | Admitting: Podiatry

## 2019-11-05 ENCOUNTER — Other Ambulatory Visit: Payer: Self-pay

## 2019-11-05 DIAGNOSIS — B351 Tinea unguium: Secondary | ICD-10-CM | POA: Diagnosis not present

## 2019-11-05 DIAGNOSIS — M79674 Pain in right toe(s): Secondary | ICD-10-CM

## 2019-11-05 DIAGNOSIS — M1711 Unilateral primary osteoarthritis, right knee: Secondary | ICD-10-CM | POA: Diagnosis not present

## 2019-11-05 DIAGNOSIS — M79675 Pain in left toe(s): Secondary | ICD-10-CM | POA: Diagnosis not present

## 2019-11-05 NOTE — Progress Notes (Signed)
.  This patient returns to the office for evaluation and treatment of long thick painful nails .  This patient is unable to trim his own nails since the patient cannot reach his feet.  Patient says the nails are painful walking and wearing his shoes. He presents to the office with his daughter. He returns for preventive foot care services.  General Appearance  Alert, conversant and in no acute stress.  Vascular  Dorsalis pedis and posterior tibial  pulses are palpable  bilaterally.  Capillary return is within normal limits  bilaterally. Temperature is within normal limits  bilaterally.  Neurologic  Senn-Weinstein monofilament wire test within normal limits  bilaterally. Muscle power within normal limits bilaterally.  Nails Thick disfigured discolored nails with subungual debris  from hallux to fifth toes bilaterally. No evidence of bacterial infection or drainage bilaterally.  Orthopedic  No limitations of motion  feet .  No crepitus or effusions noted.  No bony pathology or digital deformities noted.  Skin  normotropic skin with no porokeratosis noted bilaterally.  No signs of infections or ulcers noted.     Onychomycosis  Pain in toes right foot  Pain in toes left foot  Debridement  of nails  1-5  B/L with a nail nipper.  Nails were then filed using a dremel tool with no incidents.    RTC  4 months   Wileen Duncanson DPM  

## 2019-11-13 MED FILL — LEVOCETIRIZINE 5 MG TABLET: 5 | 30 days supply | Qty: 30 | Fill #2

## 2019-11-22 ENCOUNTER — Other Ambulatory Visit: Payer: Self-pay

## 2019-11-22 ENCOUNTER — Encounter (HOSPITAL_COMMUNITY): Payer: Self-pay | Admitting: Emergency Medicine

## 2019-11-22 ENCOUNTER — Inpatient Hospital Stay (HOSPITAL_COMMUNITY)
Admission: EM | Admit: 2019-11-22 | Discharge: 2019-11-28 | DRG: 522 | Disposition: A | Discharge status: Home Health | Payer: Medicare Other | Attending: Family Medicine | Admitting: Family Medicine

## 2019-11-22 ENCOUNTER — Emergency Department (HOSPITAL_COMMUNITY): Payer: Medicare Other

## 2019-11-22 DIAGNOSIS — D649 Anemia, unspecified: Secondary | ICD-10-CM | POA: Diagnosis present

## 2019-11-22 DIAGNOSIS — Z8249 Family history of ischemic heart disease and other diseases of the circulatory system: Secondary | ICD-10-CM

## 2019-11-22 DIAGNOSIS — Z820 Family history of epilepsy and other diseases of the nervous system: Secondary | ICD-10-CM

## 2019-11-22 DIAGNOSIS — Z96642 Presence of left artificial hip joint: Secondary | ICD-10-CM | POA: Diagnosis not present

## 2019-11-22 DIAGNOSIS — I447 Left bundle-branch block, unspecified: Secondary | ICD-10-CM | POA: Diagnosis present

## 2019-11-22 DIAGNOSIS — Z419 Encounter for procedure for purposes other than remedying health state, unspecified: Secondary | ICD-10-CM

## 2019-11-22 DIAGNOSIS — Z8241 Family history of sudden cardiac death: Secondary | ICD-10-CM | POA: Diagnosis not present

## 2019-11-22 DIAGNOSIS — Z880 Allergy status to penicillin: Secondary | ICD-10-CM

## 2019-11-22 DIAGNOSIS — Y92019 Unspecified place in single-family (private) house as the place of occurrence of the external cause: Secondary | ICD-10-CM

## 2019-11-22 DIAGNOSIS — W1830XA Fall on same level, unspecified, initial encounter: Secondary | ICD-10-CM | POA: Diagnosis present

## 2019-11-22 DIAGNOSIS — I2510 Atherosclerotic heart disease of native coronary artery without angina pectoris: Secondary | ICD-10-CM | POA: Diagnosis not present

## 2019-11-22 DIAGNOSIS — S72012A Unspecified intracapsular fracture of left femur, initial encounter for closed fracture: Principal | ICD-10-CM | POA: Diagnosis present

## 2019-11-22 DIAGNOSIS — Z20822 Contact with and (suspected) exposure to covid-19: Secondary | ICD-10-CM | POA: Diagnosis present

## 2019-11-22 DIAGNOSIS — E538 Deficiency of other specified B group vitamins: Secondary | ICD-10-CM | POA: Diagnosis present

## 2019-11-22 DIAGNOSIS — N189 Chronic kidney disease, unspecified: Secondary | ICD-10-CM | POA: Diagnosis present

## 2019-11-22 DIAGNOSIS — Z7982 Long term (current) use of aspirin: Secondary | ICD-10-CM

## 2019-11-22 DIAGNOSIS — D638 Anemia in other chronic diseases classified elsewhere: Secondary | ICD-10-CM | POA: Diagnosis present

## 2019-11-22 DIAGNOSIS — F0151 Vascular dementia with behavioral disturbance: Secondary | ICD-10-CM | POA: Diagnosis not present

## 2019-11-22 DIAGNOSIS — R911 Solitary pulmonary nodule: Secondary | ICD-10-CM | POA: Diagnosis present

## 2019-11-22 DIAGNOSIS — Z87442 Personal history of urinary calculi: Secondary | ICD-10-CM | POA: Diagnosis not present

## 2019-11-22 DIAGNOSIS — S0990XA Unspecified injury of head, initial encounter: Secondary | ICD-10-CM | POA: Diagnosis not present

## 2019-11-22 DIAGNOSIS — E7800 Pure hypercholesterolemia, unspecified: Secondary | ICD-10-CM | POA: Diagnosis present

## 2019-11-22 DIAGNOSIS — J479 Bronchiectasis, uncomplicated: Secondary | ICD-10-CM | POA: Diagnosis not present

## 2019-11-22 DIAGNOSIS — R519 Headache, unspecified: Secondary | ICD-10-CM | POA: Diagnosis not present

## 2019-11-22 DIAGNOSIS — E876 Hypokalemia: Secondary | ICD-10-CM | POA: Diagnosis not present

## 2019-11-22 DIAGNOSIS — Z7401 Bed confinement status: Secondary | ICD-10-CM | POA: Diagnosis not present

## 2019-11-22 DIAGNOSIS — Z825 Family history of asthma and other chronic lower respiratory diseases: Secondary | ICD-10-CM

## 2019-11-22 DIAGNOSIS — M479 Spondylosis, unspecified: Secondary | ICD-10-CM | POA: Diagnosis present

## 2019-11-22 DIAGNOSIS — I10 Essential (primary) hypertension: Secondary | ICD-10-CM | POA: Diagnosis not present

## 2019-11-22 DIAGNOSIS — F03918 Unspecified dementia, unspecified severity, with other behavioral disturbance: Secondary | ICD-10-CM | POA: Diagnosis present

## 2019-11-22 DIAGNOSIS — Z8551 Personal history of malignant neoplasm of bladder: Secondary | ICD-10-CM

## 2019-11-22 DIAGNOSIS — I5030 Unspecified diastolic (congestive) heart failure: Secondary | ICD-10-CM | POA: Diagnosis present

## 2019-11-22 DIAGNOSIS — S72042A Displaced fracture of base of neck of left femur, initial encounter for closed fracture: Secondary | ICD-10-CM | POA: Diagnosis not present

## 2019-11-22 DIAGNOSIS — S72002A Fracture of unspecified part of neck of left femur, initial encounter for closed fracture: Secondary | ICD-10-CM

## 2019-11-22 DIAGNOSIS — S72009A Fracture of unspecified part of neck of unspecified femur, initial encounter for closed fracture: Secondary | ICD-10-CM | POA: Diagnosis present

## 2019-11-22 DIAGNOSIS — Z955 Presence of coronary angioplasty implant and graft: Secondary | ICD-10-CM | POA: Diagnosis not present

## 2019-11-22 DIAGNOSIS — E785 Hyperlipidemia, unspecified: Secondary | ICD-10-CM | POA: Diagnosis not present

## 2019-11-22 DIAGNOSIS — R5381 Other malaise: Secondary | ICD-10-CM | POA: Diagnosis not present

## 2019-11-22 DIAGNOSIS — F0391 Unspecified dementia with behavioral disturbance: Secondary | ICD-10-CM | POA: Diagnosis present

## 2019-11-22 DIAGNOSIS — J811 Chronic pulmonary edema: Secondary | ICD-10-CM | POA: Diagnosis not present

## 2019-11-22 DIAGNOSIS — W19XXXA Unspecified fall, initial encounter: Secondary | ICD-10-CM | POA: Diagnosis not present

## 2019-11-22 DIAGNOSIS — I130 Hypertensive heart and chronic kidney disease with heart failure and stage 1 through stage 4 chronic kidney disease, or unspecified chronic kidney disease: Secondary | ICD-10-CM | POA: Diagnosis not present

## 2019-11-22 DIAGNOSIS — Z87891 Personal history of nicotine dependence: Secondary | ICD-10-CM

## 2019-11-22 DIAGNOSIS — K5790 Diverticulosis of intestine, part unspecified, without perforation or abscess without bleeding: Secondary | ICD-10-CM | POA: Diagnosis present

## 2019-11-22 DIAGNOSIS — H409 Unspecified glaucoma: Secondary | ICD-10-CM | POA: Diagnosis present

## 2019-11-22 DIAGNOSIS — R296 Repeated falls: Secondary | ICD-10-CM | POA: Diagnosis present

## 2019-11-22 DIAGNOSIS — Z471 Aftercare following joint replacement surgery: Secondary | ICD-10-CM | POA: Diagnosis not present

## 2019-11-22 DIAGNOSIS — J9811 Atelectasis: Secondary | ICD-10-CM | POA: Diagnosis not present

## 2019-11-22 DIAGNOSIS — Z66 Do not resuscitate: Secondary | ICD-10-CM | POA: Diagnosis present

## 2019-11-22 DIAGNOSIS — M542 Cervicalgia: Secondary | ICD-10-CM | POA: Diagnosis not present

## 2019-11-22 DIAGNOSIS — R410 Disorientation, unspecified: Secondary | ICD-10-CM | POA: Diagnosis not present

## 2019-11-22 DIAGNOSIS — R0602 Shortness of breath: Secondary | ICD-10-CM

## 2019-11-22 DIAGNOSIS — Z79899 Other long term (current) drug therapy: Secondary | ICD-10-CM

## 2019-11-22 DIAGNOSIS — Z01810 Encounter for preprocedural cardiovascular examination: Secondary | ICD-10-CM

## 2019-11-22 DIAGNOSIS — S199XXA Unspecified injury of neck, initial encounter: Secondary | ICD-10-CM | POA: Diagnosis not present

## 2019-11-22 DIAGNOSIS — M79652 Pain in left thigh: Secondary | ICD-10-CM | POA: Diagnosis not present

## 2019-11-22 DIAGNOSIS — M2550 Pain in unspecified joint: Secondary | ICD-10-CM | POA: Diagnosis not present

## 2019-11-22 DIAGNOSIS — Z882 Allergy status to sulfonamides status: Secondary | ICD-10-CM

## 2019-11-22 LAB — CBC WITH DIFFERENTIAL/PLATELET
Abs Immature Granulocytes: 0.02 10*3/uL (ref 0.00–0.07)
Basophils Absolute: 0 10*3/uL (ref 0.0–0.1)
Basophils Relative: 0 %
Eosinophils Absolute: 0.2 10*3/uL (ref 0.0–0.5)
Eosinophils Relative: 2 %
HCT: 31.4 % — ABNORMAL LOW (ref 39.0–52.0)
Hemoglobin: 9.6 g/dL — ABNORMAL LOW (ref 13.0–17.0)
Immature Granulocytes: 0 %
Lymphocytes Relative: 14 %
Lymphs Abs: 1.2 10*3/uL (ref 0.7–4.0)
MCH: 28.6 pg (ref 26.0–34.0)
MCHC: 30.6 g/dL (ref 30.0–36.0)
MCV: 93.5 fL (ref 80.0–100.0)
Monocytes Absolute: 0.8 10*3/uL (ref 0.1–1.0)
Monocytes Relative: 10 %
Neutro Abs: 6.2 10*3/uL (ref 1.7–7.7)
Neutrophils Relative %: 74 %
Platelets: 357 10*3/uL (ref 150–400)
RBC: 3.36 MIL/uL — ABNORMAL LOW (ref 4.22–5.81)
RDW: 13.5 % (ref 11.5–15.5)
WBC: 8.4 10*3/uL (ref 4.0–10.5)
nRBC: 0 % (ref 0.0–0.2)

## 2019-11-22 LAB — BASIC METABOLIC PANEL
Anion gap: 10 (ref 5–15)
BUN: 19 mg/dL (ref 8–23)
CO2: 26 mmol/L (ref 22–32)
Calcium: 9.1 mg/dL (ref 8.9–10.3)
Chloride: 103 mmol/L (ref 98–111)
Creatinine, Ser: 1.02 mg/dL (ref 0.61–1.24)
GFR, Estimated: >60 mL/min (ref >60)
Glucose, Bld: 108 mg/dL — ABNORMAL HIGH (ref 70–99)
Potassium: 3.9 mmol/L (ref 3.5–5.1)
Sodium: 139 mmol/L (ref 135–145)

## 2019-11-22 LAB — RESPIRATORY PANEL BY RT PCR (FLU A&B, COVID)
Influenza A by PCR: NEGATIVE
Influenza B by PCR: NEGATIVE
SARS Coronavirus 2 by RT PCR: NEGATIVE

## 2019-11-22 MED ORDER — ENOXAPARIN SODIUM 40 MG/0.4ML ~~LOC~~ SOLN: 40.0000 mg | SUBCUTANEOUS | Status: DC

## 2019-11-22 MED ORDER — LISINOPRIL 10 MG PO TABS
10.0000 mg | ORAL_TABLET | Freq: Every day | ORAL | Status: DC
  Administered 2019-11-22 – 2019-11-28 (×6): 10 mg via ORAL
  Filled 2019-11-22 (×6): qty 1

## 2019-11-22 MED ORDER — HYDROCODONE-ACETAMINOPHEN 5-325 MG PO TABS
1.0000 | ORAL_TABLET | Freq: Four times a day (QID) | ORAL | Status: DC | PRN
  Administered 2019-11-22 – 2019-11-26 (×4): 1 via ORAL
  Filled 2019-11-22 (×4): qty 1

## 2019-11-22 MED ORDER — NITROGLYCERIN 0.4 MG SL SUBL: 0.4000 mg | SUBLINGUAL_TABLET | SUBLINGUAL | Status: DC | PRN

## 2019-11-22 MED ORDER — MORPHINE SULFATE (PF) 2 MG/ML IV SOLN
0.5000 mg | INTRAVENOUS | Status: DC | PRN
  Administered 2019-11-24 (×2): 0.5 mg via INTRAVENOUS
  Filled 2019-11-22 (×2): qty 1

## 2019-11-22 MED ORDER — POLYETHYLENE GLYCOL 3350 17 G PO PACK: 17.0000 g | PACK | Freq: Every day | ORAL | Status: DC | PRN

## 2019-11-22 MED ORDER — SIMVASTATIN 20 MG PO TABS
20.0000 mg | ORAL_TABLET | Freq: Every day | ORAL | Status: DC
  Administered 2019-11-22 – 2019-11-28 (×5): 20 mg via ORAL
  Filled 2019-11-22 (×6): qty 1

## 2019-11-22 NOTE — ED Triage Notes (Signed)
Pt's daughter states that pt was seen at [facility] today and pt has a left hip fracture and was sent over for surgery.

## 2019-11-22 NOTE — ED Notes (Signed)
Condom cath placed on pt 

## 2019-11-22 NOTE — ED Notes (Signed)
Regular diet placed per verbal order from Dr. Neysa Bonito.  Pt will be NPO at midnight.

## 2019-11-22 NOTE — Consult Note (Signed)
Reason for Consult:Left femoral neck fracture Referring Physician: Dr Charlsie Quest Jesus Harmon is an 86 y.o. male.  HPI: Jesus Harmon is an 86 yo male with a 3 day history of left hip pain s/p falls at home. These were mechanical falls and not associated with any neurologic symptoms. He had increased pain left hip and thigh last night and this morning and presented to the [facility] clinic this morning where x-rays revealed an incomplete displaced left femoral neck fracture. Dr Gladstone Lighter saw him and sent him to [facility] ED and Jesus Hulsebus is admitted to the hospitalist service. We are consulted for surgical management. He denies any other symptoms except for the left hip/thigh pain.  Past Medical History:  Diagnosis Date  . Arthritis    back and neck (06/16/2016)  . Bladder cancer ([place])   . Blockage of coronary artery of heart (HCC)    "widow maker", had stenting of the R side but L main is still blocked   . CAD (coronary artery disease)    a. LHC 10/2009:  pLAD 50%, small D1 70-80% (too small for PCI), EF 65% => med Rx  b. Myoview 10/2009: EF 65%, no ischemia;  c.  ETT-MV 3/14:  No ischemia, EF 63%  . Cancer [facility])    sin cancer removed nose   . Diverticulosis   . Glaucoma   . History of echocardiogram    Echo 02/2018: EF 11-91, +diastolic dysfunction, no RWMA, normal RVSF, trivial Jesus  . History of kidney stones    "passed them"  . HLD (hyperlipidemia)    "on RX; never had high cholestrol" (06/16/2016)  . HTN (hypertension)    "on RX; never HTN" (06/16/2016)  . Migraine    "in high school" (06/16/2016)    Past Surgical History:  Procedure Laterality Date  . BASAL CELL CARCINOMA EXCISION     "one of my ears; big one on my nose; one on my left shoulder" (06/16/2016)  . CARDIAC CATHETERIZATION  2017   "treated w/RX"  . CATARACT EXTRACTION W/ INTRAOCULAR LENS  IMPLANT, BILATERAL Bilateral   . CORONARY ANGIOPLASTY WITH STENT PLACEMENT  06/16/2016  . CORONARY BALLOON ANGIOPLASTY N/A 06/16/2016    Procedure: Coronary Balloon Angioplasty;  Surgeon: Sherren Mocha, MD;  Location: [facility];  Service: Cardiovascular;  Laterality: N/A;  Prox LAD  . CORONARY STENT INTERVENTION  06/16/2016   Procedure: Coronary Stent Intervention;  Surgeon: Sherren Mocha, MD;  Location: [place] [facility];  Service: Cardiovascular;;  Synergy 4.0x16 to Mid RCA  . INGUINAL HERNIA REPAIR Left 12/22/2016   Procedure: REPAIR OF LEFT INGUINAL HERNIA WITH MESH;  Surgeon: Georganna Skeans, MD;  Location: [facility];  Service: General;  Laterality: Left;  . INGUINAL HERNIA REPAIR Left 10/09/2019   Procedure: LAPAROSCOPIC LEFT INGUINAL HERNIA REPAIR WITH MESH;  Surgeon: Ralene Ok, MD;  Location: [place];  Service: General;  Laterality: Left;  . INSERTION OF MESH Left 12/22/2016   Procedure: INSERTION OF MESH;  Surgeon: Georganna Skeans, MD;  Location: [facility];  Service: General;  Laterality: Left;  . INTRAVASCULAR PRESSURE WIRE/FFR STUDY N/A 06/16/2016   Procedure: Intravascular Pressure Wire/FFR Study;  Surgeon: Sherren Mocha, MD;  Location: [place] [facility];  Service: Cardiovascular;  Laterality: N/A;  Prox LAD  . KNEE ARTHROSCOPY     "not sure which side"  . LEFT HEART CATH AND CORONARY ANGIOGRAPHY N/A 06/16/2016   Procedure: Left Heart Cath and Coronary Angiography;  Surgeon: Sherren Mocha, MD;  Location:  [place] [facility];  Service: Cardiovascular;  Laterality: N/A;  . TONSILLECTOMY    . TRANSURETHRAL RESECTION OF PROSTATE N/A 03/04/2014   Procedure: TRANSURETHRAL RESECTION OF THE PROSTATE, TRANSURETHRAL RESECTION OF THE BLADDER TUMOR;  Surgeon: Ailene Rud, MD;  Location: [facility];  Service: Urology;  Laterality: N/A;    Family History  Problem Relation Age of Onset  . Sudden death Mother        sudden cardiac arrest  . COPD Father   . Heart failure Father        CHF  . Emphysema Father   . Parkinson's disease Maternal Uncle   . Dementia Neg Hx   . Alzheimer's disease Neg Hx     Social  History:  reports that he quit smoking about 58 years ago. He has a 18.00 pack-year smoking history. He has never used smokeless tobacco. He reports that he does not drink alcohol and does not use drugs.  Allergies:  Allergies  Allergen Reactions  . Amoxicillin Swelling, Rash and Other (See Comments)    Swelling and rash to face Has patient had a PCN reaction causing immediate rash, facial/tongue/throat swelling, SOB or lightheadedness with hypotension: Yes Has patient had a PCN reaction causing severe rash involving mucus membranes or skin necrosis: No Has patient had a PCN reaction that required hospitalization: No Has patient had a PCN reaction occurring within the last 10 years: Yes If all of the above answers are "NO", then may proceed with Cephalosporin use.    . Sulfonamide Derivatives Hives  . Sulfamethoxazole Hives and Other (See Comments)    Medications: I have reviewed the patient's current medications.  Results for orders placed or performed during the hospital encounter of 11/22/19 (from the past 48 hour(s))  CBC with Differential     Status: Abnormal   Collection Time: 11/22/19 12:12 PM  Result Value Ref Range   WBC 8.4 4.0 - 10.5 K/uL   RBC 3.36 (L) 4.22 - 5.81 MIL/uL   Hemoglobin 9.6 (L) 13.0 - 17.0 g/dL   HCT 31.4 (L) 39 - 52 %   MCV 93.5 80.0 - 100.0 fL   MCH 28.6 26.0 - 34.0 pg   MCHC 30.6 30.0 - 36.0 g/dL   RDW 13.5 11.5 - 15.5 %   Platelets 357 150 - 400 K/uL   nRBC 0.0 0.0 - 0.2 %   Neutrophils Relative % 74 %   Neutro Abs 6.2 1.7 - 7.7 K/uL   Lymphocytes Relative 14 %   Lymphs Abs 1.2 0.7 - 4.0 K/uL   Monocytes Relative 10 %   Monocytes Absolute 0.8 0.1 - 1.0 K/uL   Eosinophils Relative 2 %   Eosinophils Absolute 0.2 0.0 - 0.5 K/uL   Basophils Relative 0 %   Basophils Absolute 0.0 0.0 - 0.1 K/uL   Immature Granulocytes 0 %   Abs Immature Granulocytes 0.02 0.00 - 0.07 K/uL    Comment: Performed at [facility], [place] [address] [place], [place] [address]  Basic metabolic panel     Status: Abnormal   Collection Time: 11/22/19 12:12 PM  Result Value Ref Range   Sodium 139 135 - 145 mmol/L   Potassium 3.9 3.5 - 5.1 mmol/L   Chloride 103 98 - 111 mmol/L   CO2 26 22 - 32 mmol/L   Glucose, Bld 108 (H) 70 - 99 mg/dL    Comment: Glucose reference range applies only to samples taken after fasting for at least 8 hours.  BUN 19 8 - 23 mg/dL   Creatinine, Ser 1.02 0.61 - 1.24 mg/dL   Calcium 9.1 8.9 - 10.3 mg/dL   GFR, Estimated >60 >60 mL/min    Comment: (NOTE) Calculated using the CKD-EPI Creatinine Equation (2021)    Anion gap 10 5 - 15    Comment: Performed at [facility], [place] [address] [place], [place] [address]  Respiratory Panel by RT PCR (Flu A&B, Covid) - Nasopharyngeal Swab     Status: None   Collection Time: 11/22/19 12:13 PM   Specimen: Nasopharyngeal Swab  Result Value Ref Range   SARS Coronavirus 2 by RT PCR NEGATIVE NEGATIVE    Comment: (NOTE) SARS-CoV-2 target nucleic acids are NOT DETECTED.  The SARS-CoV-2 RNA is generally detectable in upper respiratoy specimens during the acute phase of infection. The lowest concentration of SARS-CoV-2 viral copies this assay can detect is 131 copies/mL. A negative result does not preclude SARS-Cov-2 infection and should not be used as the sole basis for treatment or other patient management decisions. A negative result may occur with  improper specimen collection/handling, submission of specimen other than nasopharyngeal swab, presence of viral mutation(s) within the areas targeted by this assay, and inadequate number of viral copies (<131 copies/mL). A negative result must be combined with clinical observations, patient history, and epidemiological information. The expected result is Negative.  Fact Sheet for Patients:  PinkCheek.be  Fact Sheet for Healthcare Providers:   GravelBags.it  This test is no t yet approved or cleared by the [place] FDA and  has been authorized for detection and/or diagnosis of SARS-CoV-2 by FDA under an Emergency Use Authorization (EUA). This EUA will remain  in effect (meaning this test can be used) for the duration of the COVID-19 declaration under Section 564(b)(1) of the Act, 21 U.S.C. section 360bbb-3(b)(1), unless the authorization is terminated or revoked sooner.     Influenza A by PCR NEGATIVE NEGATIVE   Influenza B by PCR NEGATIVE NEGATIVE    Comment: (NOTE) The Xpert Xpress SARS-CoV-2/FLU/RSV assay is intended as an aid in  the diagnosis of influenza from Nasopharyngeal swab specimens and  should not be used as a sole basis for treatment. Nasal washings and  aspirates are unacceptable for Xpert Xpress SARS-CoV-2/FLU/RSV  testing.  Fact Sheet for Patients: PinkCheek.be  Fact Sheet for Healthcare Providers: GravelBags.it  This test is not yet approved or cleared by the [place] FDA and  has been authorized for detection and/or diagnosis of SARS-CoV-2 by  FDA under an Emergency Use Authorization (EUA). This EUA will remain  in effect (meaning this test can be used) for the duration of the  Covid-19 declaration under Section 564(b)(1) of the Act, 21  U.S.C. section 360bbb-3(b)(1), unless the authorization is  terminated or revoked. Performed at [facility], [place] [address] [place], [place] [address]     CT Head Wo Contrast  Result Date: 11/22/2019 CLINICAL DATA:  Head injury and neck pain after fall. EXAM: CT HEAD WITHOUT CONTRAST CT CERVICAL SPINE WITHOUT CONTRAST TECHNIQUE: Multidetector CT imaging of the head and cervical spine was performed following the standard protocol without intravenous contrast. Multiplanar CT image reconstructions of the cervical spine were also generated.  COMPARISON:  September 05, 2013. FINDINGS: CT HEAD FINDINGS Brain: Mild chronic ischemic white matter disease is noted. Mild diffuse cortical atrophy is noted. No mass effect or midline shift is noted. Ventricular size is within normal limits. There is no evidence of mass lesion, hemorrhage  or acute infarction. Vascular: No hyperdense vessel or unexpected calcification. Skull: Normal. Negative for fracture or focal lesion. Sinuses/Orbits: No acute finding. Other: None. CT CERVICAL SPINE FINDINGS Alignment: Minimal grade 1 retrolisthesis of C5-6 is noted secondary to severe degenerative disc disease at this level. Skull base and vertebrae: No acute fracture. No primary bone lesion or focal pathologic process. Soft tissues and spinal canal: No prevertebral fluid or swelling. No visible canal hematoma. Disc levels: Severe degenerative disc disease is noted at C4-5, C5-6, C6-7 and C7-T1. Upper chest: 11 x 6 mm irregular nodule is seen medially in right upper lobe with pleural tail concerning for possible neoplasm. Other: None. IMPRESSION: 1. Mild chronic ischemic white matter disease. Mild diffuse cortical atrophy. No acute intracranial abnormality seen. 2. Severe multilevel degenerative disc disease. No acute abnormality seen in the cervical spine. 3. 11 x 6 mm irregular nodule is seen medially in right upper lobe with pleural tail concerning for possible neoplasm. CT scan of the chest is recommended for further evaluation. Electronically Signed   By: Marijo Conception M.D.   On: 11/22/2019 13:22   CT Cervical Spine Wo Contrast  Result Date: 11/22/2019 CLINICAL DATA:  Head injury and neck pain after fall. EXAM: CT HEAD WITHOUT CONTRAST CT CERVICAL SPINE WITHOUT CONTRAST TECHNIQUE: Multidetector CT imaging of the head and cervical spine was performed following the standard protocol without intravenous contrast. Multiplanar CT image reconstructions of the cervical spine were also generated. COMPARISON:  September 05, 2013.  FINDINGS: CT HEAD FINDINGS Brain: Mild chronic ischemic white matter disease is noted. Mild diffuse cortical atrophy is noted. No mass effect or midline shift is noted. Ventricular size is within normal limits. There is no evidence of mass lesion, hemorrhage or acute infarction. Vascular: No hyperdense vessel or unexpected calcification. Skull: Normal. Negative for fracture or focal lesion. Sinuses/Orbits: No acute finding. Other: None. CT CERVICAL SPINE FINDINGS Alignment: Minimal grade 1 retrolisthesis of C5-6 is noted secondary to severe degenerative disc disease at this level. Skull base and vertebrae: No acute fracture. No primary bone lesion or focal pathologic process. Soft tissues and spinal canal: No prevertebral fluid or swelling. No visible canal hematoma. Disc levels: Severe degenerative disc disease is noted at C4-5, C5-6, C6-7 and C7-T1. Upper chest: 11 x 6 mm irregular nodule is seen medially in right upper lobe with pleural tail concerning for possible neoplasm. Other: None. IMPRESSION: 1. Mild chronic ischemic white matter disease. Mild diffuse cortical atrophy. No acute intracranial abnormality seen. 2. Severe multilevel degenerative disc disease. No acute abnormality seen in the cervical spine. 3. 11 x 6 mm irregular nodule is seen medially in right upper lobe with pleural tail concerning for possible neoplasm. CT scan of the chest is recommended for further evaluation. Electronically Signed   By: Marijo Conception M.D.   On: 11/22/2019 13:22    Review of Systems Blood pressure (!) 126/59, pulse (!) 58, temperature 98.2 F (36.8 C), temperature source Oral, resp. rate 17, SpO2 100 %. Physical Exam Physical Examination: General appearance - alert, well appearing, and in no distress Mental status - alert, oriented to person, place, and time Chest - clear to auscultation, no wheezes, rales or rhonchi, symmetric air entry Heart - normal rate, regular rhythm, normal S1, S2, no murmurs, rubs,  clicks or gallops Abdomen - soft, nontender, nondistended, no masses or organomegaly Neurological - alert, oriented, normal speech, no focal findings or movement disorder noted LLE externally rotated with minimal shortening; + DP/PT pulse; EHL/FHL/TA/Gastroc  intact; sensation intact in foot; no knee effusion or tenderness; pain on attempted range of motion of left hip; tender over  greater trochanter  X-rays from [facility]- AP and lat left hip- Femoral neck fracture with varus/posterior angulation   Assessment/Plan: Left femoral neck fracture- Patient is admitted to hospitalist service. Will require operative treatment with left Total Hip Arthroplasty. Discussed with the patient and his daughter and they elect to proceed. I have spoken with Dr. Lyla Glassing who can do the surgery tomorrow afternoon and they are comfortable with him doing it. NPO after midnight in preparation for surgery tomorrow  Gaynelle Arabian 11/22/2019, 6:27 PM

## 2019-11-22 NOTE — Consult Note (Signed)
Cardiology Consultation:   Patient ID: Jesus Harmon MRN: [id number]; DOB: 1929-04-15  Admit date: 11/22/2019 Date of Consult: 11/22/2019  Primary Care Provider: Alroy Dust, L.Marlou Sa, MD [facility] Cardiologist: Sherren Mocha, MD  [place] Electrophysiologist:  None   Patient Profile:   Jesus Harmon is a 86 y.o. male with a hx of CAD, hypertension, hyperlipidemia who is being seen today for the evaluation of preoperative cardiovascular risk at the request of Dr. Neysa Bonito.  History of Present Illness:   [name] was seen in the office of [facility] today. He was instructed to come to [facility] ER due to hip fracture. He is being admitted by Dr. Neysa Bonito, who reports that surgery is planned for either 11/12 or 11/14.  Of note, he was cleared by Dr. Burt Knack for laparoscopic inguinal mesh procedure 09/2019. Noted that it was ok to hold aspirin for 5 days prior to surgery. Last seen in the office by [facility] 04/11/19.  Planned surgery: TBD date/surgeon/specific procedure  Pertinent past cardiac history: CAD with DES to RCA in 2018 Prior cardiac workup: echo 02/24/18, cath 06/16/2016, myoview 05/20/16 History of valve disease: no significant History of heart failure: none History of arrhythmia: none On anticoagulation: no, only aspirin History of hypertension: yes, on lisinopril History of diabetes: no History of CKD: Cr today 1.02, GFR >60 Current symptoms: none Functional capacity: prior to his recent falls, he was walking frequently with either cane or [place], able to climb stairs. Typically active around the house. No limitations.  Denies chest pain, shortness of breath at rest or with normal exertion. No PND, orthopnea, LE edema or unexpected weight gain. No syncope or palpitations.   Past Medical History:  Diagnosis Date  . Arthritis    back and neck (06/16/2016)  . Bladder cancer ([place])   . Blockage of coronary artery of heart (HCC)    "widow maker", had stenting of  the R side but L main is still blocked   . CAD (coronary artery disease)    a. LHC 10/2009:  pLAD 50%, small D1 70-80% (too small for PCI), EF 65% => med Rx  b. Myoview 10/2009: EF 65%, no ischemia;  c.  ETT-MV 3/14:  No ischemia, EF 63%  . Cancer [facility])    sin cancer removed nose   . Diverticulosis   . Glaucoma   . History of echocardiogram    Echo 02/2018: EF 93-79, +diastolic dysfunction, no RWMA, normal RVSF, trivial MR  . History of kidney stones    "passed them"  . HLD (hyperlipidemia)    "on RX; never had high cholestrol" (06/16/2016)  . HTN (hypertension)    "on RX; never HTN" (06/16/2016)  . Migraine    "in high school" (06/16/2016)    Past Surgical History:  Procedure Laterality Date  . BASAL CELL CARCINOMA EXCISION     "one of my ears; big one on my nose; one on my left shoulder" (06/16/2016)  . CARDIAC CATHETERIZATION  2017   "treated w/RX"  . CATARACT EXTRACTION W/ INTRAOCULAR LENS  IMPLANT, BILATERAL Bilateral   . CORONARY ANGIOPLASTY WITH STENT PLACEMENT  06/16/2016  . CORONARY BALLOON ANGIOPLASTY N/A 06/16/2016   Procedure: Coronary Balloon Angioplasty;  Surgeon: Sherren Mocha, MD;  Location: [place] CV [facility];  Service: Cardiovascular;  Laterality: N/A;  Prox LAD  . CORONARY STENT INTERVENTION  06/16/2016   Procedure: Coronary Stent Intervention;  Surgeon: Sherren Mocha, MD;  Location: [place] [facility];  Service: Cardiovascular;;  Synergy 4.0x16  to Mid RCA  . INGUINAL HERNIA REPAIR Left 12/22/2016   Procedure: REPAIR OF LEFT INGUINAL HERNIA WITH MESH;  Surgeon: Georganna Skeans, MD;  Location: [facility];  Service: General;  Laterality: Left;  . INGUINAL HERNIA REPAIR Left 10/09/2019   Procedure: LAPAROSCOPIC LEFT INGUINAL HERNIA REPAIR WITH MESH;  Surgeon: Ralene Ok, MD;  Location: [place];  Service: General;  Laterality: Left;  . INSERTION OF MESH Left 12/22/2016   Procedure: INSERTION OF MESH;  Surgeon: Georganna Skeans, MD;  Location: [facility];  Service: General;   Laterality: Left;  . INTRAVASCULAR PRESSURE WIRE/FFR STUDY N/A 06/16/2016   Procedure: Intravascular Pressure Wire/FFR Study;  Surgeon: Sherren Mocha, MD;  Location: [place] [facility];  Service: Cardiovascular;  Laterality: N/A;  Prox LAD  . KNEE ARTHROSCOPY     "not sure which side"  . LEFT HEART CATH AND CORONARY ANGIOGRAPHY N/A 06/16/2016   Procedure: Left Heart Cath and Coronary Angiography;  Surgeon: Sherren Mocha, MD;  Location: [place] [facility];  Service: Cardiovascular;  Laterality: N/A;  . TONSILLECTOMY    . TRANSURETHRAL RESECTION OF PROSTATE N/A 03/04/2014   Procedure: TRANSURETHRAL RESECTION OF THE PROSTATE, TRANSURETHRAL RESECTION OF THE BLADDER TUMOR;  Surgeon: Ailene Rud, MD;  Location: [facility];  Service: Urology;  Laterality: N/A;     Home Medications:  Prior to Admission medications   Medication Sig Start Date End Date Taking? Authorizing Provider  acetaminophen (TYLENOL) 500 MG tablet Take 1,000 mg by mouth every 6 (six) hours as needed for moderate pain.   Yes [provider]  aspirin EC 81 MG tablet Take 81 mg by mouth every evening.    Yes [provider]  diclofenac (VOLTAREN) 75 MG EC tablet Take 75 mg by mouth 2 (two) times daily as needed for moderate pain.  10/03/19  Yes [provider]  latanoprost (XALATAN) 0.005 % ophthalmic solution Place 1 drop into both eyes at bedtime.    Yes [provider]  levocetirizine (XYZAL) 5 MG tablet Take 5 mg by mouth every evening.    Yes [provider]  lisinopril (ZESTRIL) 10 MG tablet TAKE 1 TABLET BY MOUTH DAILY. Patient taking differently: Take 10 mg by mouth daily.  06/13/19  Yes Sherren Mocha, MD  nitroGLYCERIN (NITROSTAT) 0.4 MG SL tablet PLACE 1 TABLET UNDER THE TONGUE EVERY 5 MINUTES AS NEEDED FOR CHEST PAIN. Patient taking differently: Place 0.4 mg under the tongue every 5 (five) minutes x 3 doses as needed for chest pain.  05/10/19  Yes Sherren Mocha, MD    simvastatin (ZOCOR) 20 MG tablet TAKE 1 TABLET BY MOUTH DAILY. Patient taking differently: Take 20 mg by mouth at bedtime.  05/22/19  Yes Sherren Mocha, MD  tiZANidine (ZANAFLEX) 2 MG tablet Take 2 mg by mouth daily as needed for muscle spasms.  10/03/19  Yes [provider]  vitamin B-12 (CYANOCOBALAMIN) 1000 MCG tablet Take 1,000 mcg by mouth daily.   Yes [provider]  memantine (NAMENDA) 10 MG tablet Start with one pill in the morning. In 2 weeks can increase to one pill twice daily.Stop for any side effects. Patient not taking: Reported on 11/22/2019 10/17/19   Melvenia Beam, MD    Inpatient Medications: Scheduled Meds:  Continuous Infusions:  PRN Meds:   Allergies:    Allergies  Allergen Reactions  . Amoxicillin Swelling, Rash and Other (See Comments)    Swelling and rash to face Has patient had a PCN reaction causing immediate rash,  facial/tongue/throat swelling, SOB or lightheadedness with hypotension: Yes Has patient had a PCN reaction causing severe rash involving mucus membranes or skin necrosis: No Has patient had a PCN reaction that required hospitalization: No Has patient had a PCN reaction occurring within the last 10 years: Yes If all of the above answers are "NO", then may proceed with Cephalosporin use.    . Sulfonamide Derivatives Hives  . Sulfamethoxazole Hives and Other (See Comments)    Social History:   Social History   Socioeconomic History  . Marital status: Married    Spouse name: Not on file  . Number of children: 2  . Years of education: 1  . Highest education level: Not on file  Occupational History  . Occupation: retired  . Occupation: part time car auctioneer  Tobacco Use  . Smoking status: Former Smoker    Packs/day: 1.00    Years: 18.00    Pack years: 18.00    Quit date: 1963    Years since quitting: 58.9  . Smokeless tobacco: Never Used  Vaping Use  . Vaping Use: Never used  Substance and Sexual Activity   . Alcohol use: Never  . Drug use: Never  . Sexual activity: Not on file  Other Topics Concern  . Not on file  Social History Narrative   Lives at home with wife   Caffeine: 1/2 caf and 1/2 decaf maybe 2 cups/day   Social Determinants of Health   Financial Resource Strain:   . Difficulty of Paying Living Expenses: Not on file  Food Insecurity:   . Worried About Charity fundraiser in the Last Year: Not on file  . Ran Out of Food in the Last Year: Not on file  Transportation Needs:   . Lack of Transportation (Medical): Not on file  . Lack of Transportation (Non-Medical): Not on file  Physical Activity:   . Days of Exercise per Week: Not on file  . Minutes of Exercise per Session: Not on file  Stress:   . Feeling of Stress : Not on file  Social Connections:   . Frequency of Communication with Friends and Family: Not on file  . Frequency of Social Gatherings with Friends and Family: Not on file  . Attends Religious Services: Not on file  . Active Member of Clubs or Organizations: Not on file  . Attends Archivist Meetings: Not on file  . Marital Status: Not on file  Intimate Partner Violence:   . Fear of Current or Ex-Partner: Not on file  . Emotionally Abused: Not on file  . Physically Abused: Not on file  . Sexually Abused: Not on file    Family History:    Family History  Problem Relation Age of Onset  . Sudden death Mother        sudden cardiac arrest  . COPD Father   . Heart failure Father        CHF  . Emphysema Father   . Parkinson's disease Maternal Uncle   . Dementia Neg Hx   . Alzheimer's disease Neg Hx      ROS:  Please see the history of present illness.  Constitutional: Negative for chills, fever, night sweats, unintentional weight loss  HENT: Negative for ear pain and hearing loss.   Eyes: Negative for loss of vision and eye pain.  Respiratory: Negative for cough, sputum, wheezing.   Cardiovascular: See HPI. Gastrointestinal: Negative  for abdominal pain, melena, and hematochezia.  Genitourinary: Negative for dysuria  and hematuria.  Musculoskeletal: Positive for hip fracture Skin: Negative for itching and rash.  Neurological: Negative for focal weakness, focal sensory changes and loss of consciousness.  Endo/Heme/Allergies: Does not bruise/bleed easily.  All other ROS reviewed and negative.     Physical Exam/Data:   Vitals:   11/22/19 1053 11/22/19 1218 11/22/19 1334 11/22/19 1500  BP: (!) 140/59 (!) 143/81 (!) 119/57 131/62  Pulse: 64 64 67 60  Resp: 16 18 20 19   Temp: 98.2 F (36.8 C)     TempSrc: Oral     SpO2: 100% 100% 99% 100%   No intake or output data in the 24 hours ending 11/22/19 1505 Last 3 Weights 10/17/2019 10/05/2019 05/10/2019  Weight (lbs) 139 lb 141 lb 6.4 oz 139 lb  Weight (kg) 63.05 kg 64.139 kg 63.05 kg     There is no height or weight on file to calculate BMI.  General:  Well nourished, well developed, in no acute distress HEENT: normal Lymph: no adenopathy Neck: no JVD Endocrine:  No thryomegaly Vascular: No carotid bruits; RA pulses 2+ bilaterally Cardiac:  normal S1, S2; RRR; no murmur appreciated Lungs:  clear to auscultation bilaterally, no wheezing, rhonchi or rales  Abd: soft, nontender, no hepatomegaly  Ext: no significant edema Musculoskeletal:  No deformities, BUE and BLE strength normal and equal Skin: warm and dry  Neuro:  CNs 2-12 intact, no focal abnormalities noted Psych:  Normal affect   EKG:  The EKG has not yet been performed. Telemetry:  Telemetry was personally reviewed and demonstrates:  Normal sinus rhythm  Relevant CV Studies: Echo 02/24/18 1. The left ventricle has normal systolic function, with an ejection  fraction of 55-60%. The cavity size was normal. Left ventricular diastolic  Doppler parameters are consistent with pseudonormalization No evidence of  left ventricular regional wall  motion abnormalities.  2. The right ventricle has normal systolic  function. The cavity was  normal. There is no increase in right ventricular wall thickness.  3. The mitral valve is normal in structure. No evidence of mitral valve  stenosis. Trivial regurgitation.  4. The tricuspid valve is normal in structure.  5. The aortic valve is tricuspid Mild calcification of the aortic valve.  no stenosis of the aortic valve.  6. The pulmonic valve was normal in structure.  7. The inferior vena cava was dilated in size with >50% respiratory  variability.  8. Right atrial pressure is estimated at 8 mmHg.  9. PA systolic pressure 34 mmHg.   Cath 06/16/2016 1. Severe stenosis of the mid right coronary artery treated successfully with PTCA and stenting using a 4.0 x 16 mm Synergy DES 2. Severe proximal LAD stenosis confirmed by FFR analysis (0.72) but unsuccessful angioplasty because of inability to expand a balloon in this rigid lesion 3. Widely patent left main and left circumflex 4. Normal LV function by noninvasive assessment  Recommendation: The patient will be observed overnight. Will continue dual antiplatelet therapy with aspirin and clopidogrel for 12 months. Consider atherectomy and PCI of the LAD lesion in a few weeks after reassessment of the patient in the outpatient setting.   Laboratory Data:  High Sensitivity Troponin:  No results for input(s): TROPONINIHS in the last 720 hours.   Chemistry Recent Labs  Lab 11/22/19 1212  NA 139  K 3.9  CL 103  CO2 26  GLUCOSE 108*  BUN 19  CREATININE 1.02  CALCIUM 9.1  GFRNONAA >60  ANIONGAP 10    No results for  input(s): PROT, ALBUMIN, AST, ALT, ALKPHOS, BILITOT in the last 168 hours. Hematology Recent Labs  Lab 11/22/19 1212  WBC 8.4  RBC 3.36*  HGB 9.6*  HCT 31.4*  MCV 93.5  MCH 28.6  MCHC 30.6  RDW 13.5  PLT 357   BNPNo results for input(s): BNP, PROBNP in the last 168 hours.  DDimer No results for input(s): DDIMER in the last 168 hours.   Radiology/Studies:  CT Head Wo  Contrast  Result Date: 11/22/2019 CLINICAL DATA:  Head injury and neck pain after fall. EXAM: CT HEAD WITHOUT CONTRAST CT CERVICAL SPINE WITHOUT CONTRAST TECHNIQUE: Multidetector CT imaging of the head and cervical spine was performed following the standard protocol without intravenous contrast. Multiplanar CT image reconstructions of the cervical spine were also generated. COMPARISON:  September 05, 2013. FINDINGS: CT HEAD FINDINGS Brain: Mild chronic ischemic white matter disease is noted. Mild diffuse cortical atrophy is noted. No mass effect or midline shift is noted. Ventricular size is within normal limits. There is no evidence of mass lesion, hemorrhage or acute infarction. Vascular: No hyperdense vessel or unexpected calcification. Skull: Normal. Negative for fracture or focal lesion. Sinuses/Orbits: No acute finding. Other: None. CT CERVICAL SPINE FINDINGS Alignment: Minimal grade 1 retrolisthesis of C5-6 is noted secondary to severe degenerative disc disease at this level. Skull base and vertebrae: No acute fracture. No primary bone lesion or focal pathologic process. Soft tissues and spinal canal: No prevertebral fluid or swelling. No visible canal hematoma. Disc levels: Severe degenerative disc disease is noted at C4-5, C5-6, C6-7 and C7-T1. Upper chest: 11 x 6 mm irregular nodule is seen medially in right upper lobe with pleural tail concerning for possible neoplasm. Other: None. IMPRESSION: 1. Mild chronic ischemic white matter disease. Mild diffuse cortical atrophy. No acute intracranial abnormality seen. 2. Severe multilevel degenerative disc disease. No acute abnormality seen in the cervical spine. 3. 11 x 6 mm irregular nodule is seen medially in right upper lobe with pleural tail concerning for possible neoplasm. CT scan of the chest is recommended for further evaluation. Electronically Signed   By: Marijo Conception M.D.   On: 11/22/2019 13:22   CT Cervical Spine Wo Contrast  Result Date:  11/22/2019 CLINICAL DATA:  Head injury and neck pain after fall. EXAM: CT HEAD WITHOUT CONTRAST CT CERVICAL SPINE WITHOUT CONTRAST TECHNIQUE: Multidetector CT imaging of the head and cervical spine was performed following the standard protocol without intravenous contrast. Multiplanar CT image reconstructions of the cervical spine were also generated. COMPARISON:  September 05, 2013. FINDINGS: CT HEAD FINDINGS Brain: Mild chronic ischemic white matter disease is noted. Mild diffuse cortical atrophy is noted. No mass effect or midline shift is noted. Ventricular size is within normal limits. There is no evidence of mass lesion, hemorrhage or acute infarction. Vascular: No hyperdense vessel or unexpected calcification. Skull: Normal. Negative for fracture or focal lesion. Sinuses/Orbits: No acute finding. Other: None. CT CERVICAL SPINE FINDINGS Alignment: Minimal grade 1 retrolisthesis of C5-6 is noted secondary to severe degenerative disc disease at this level. Skull base and vertebrae: No acute fracture. No primary bone lesion or focal pathologic process. Soft tissues and spinal canal: No prevertebral fluid or swelling. No visible canal hematoma. Disc levels: Severe degenerative disc disease is noted at C4-5, C5-6, C6-7 and C7-T1. Upper chest: 11 x 6 mm irregular nodule is seen medially in right upper lobe with pleural tail concerning for possible neoplasm. Other: None. IMPRESSION: 1. Mild chronic ischemic white matter disease. Mild  diffuse cortical atrophy. No acute intracranial abnormality seen. 2. Severe multilevel degenerative disc disease. No acute abnormality seen in the cervical spine. 3. 11 x 6 mm irregular nodule is seen medially in right upper lobe with pleural tail concerning for possible neoplasm. CT scan of the chest is recommended for further evaluation. Electronically Signed   By: Marijo Conception M.D.   On: 11/22/2019 13:22     Assessment and Plan:   Preoperative cardiovascular assessment: Based  on available date, patient's RCRI score = 1, which carries a 6% 30-day risk of death, MI, or cardiac arrest. This is due to his CAD history, but he has not had any chest pain.  The patient is not currently having active cardiac symptoms, and they can achieve >4 METs of activity.  According to ACC/AHA Guidelines, no further testing is needed.  Proceed with surgery at acceptable risk.  Our service is available as needed in the peri-operative period.     History of CAD with remote stent Hypercholesterolemia -no recent chest pain -last aspirin dose PM 11/9. Restart post op when able -continue simvastatin 20 mg daily  Hypertension: -continue lisinopril 10 mg daily.  Hip fracture 2/2 recent falls: pending surgery with [facility]  [facility] will sign off.  We are available if concerns arise during his admission, please contact us with questions.  For questions or updates, please contact [place] Please consult [email or web address] for contact info under    Signed, Buford Dresser, MD  11/22/2019 3:05 PM

## 2019-11-22 NOTE — ED Provider Notes (Addendum)
[facility] DEPT Provider Note   CSN: [id number] Arrival date & time: 11/22/19  1045     History Chief Complaint  Patient presents with  . Fall  . Hip Pain    Jesus Harmon is a 86 y.o. male history of CAD, LBBB, hypertension, hyperlipidemia.  Patient arrives today with his daughter from orthopedic office for left hip fracture.  History obtained from both patient and his daughter, patient has had 4 falls in the past week.  Initial fall was onto his left hip which she has had pain to the area since that time described initially as a mild ache constant worsened with ambulation improved with rest.  He has been ambulating since that time and has suffered 3 additional falls with the most recent being last night.  He was sitting in his chair leaning over to the right to pick something up off the ground when he lost his balance striking the back of his head on the wall denies any injury from that fall or loss of consciousness.  He reports each of his falls over the past week have been due to either uneven terrain or leaning over too far.  He denies any syncopal episode, dizziness, shortness of breath, numbness/weakness or other potential causes of his fall.  He does report that during one of his falls earlier this week he did strike his right cheek on the ground and suffered a small bruise but denies any pain to that area as well.  Patient was taken to orthopedic urgent care by his daughter today where he had x-ray of the left hip obtained which showed a fracture.  They report they were sent in for admission to the hospital by Dr. Les Pou.  Denies fever/chills, headache, vision changes, trismus, facial pain, neck pain, back pain, chest pain, abdominal pain, shortness of breath, nausea/vomiting, numbness/tingling, weakness, dysuria/hematuria or any additional concerns.  HPI     Past Medical History:  Diagnosis Date  . Arthritis    back and neck (06/16/2016)  .  Bladder cancer ([place])   . Blockage of coronary artery of heart (HCC)    "widow maker", had stenting of the R side but L main is still blocked   . CAD (coronary artery disease)    a. LHC 10/2009:  pLAD 50%, small D1 70-80% (too small for PCI), EF 65% => med Rx  b. Myoview 10/2009: EF 65%, no ischemia;  c.  ETT-MV 3/14:  No ischemia, EF 63%  . Cancer [facility])    sin cancer removed nose   . Diverticulosis   . Glaucoma   . History of echocardiogram    Echo 02/2018: EF 22-02, +diastolic dysfunction, no RWMA, normal RVSF, trivial MR  . History of kidney stones    "passed them"  . HLD (hyperlipidemia)    "on RX; never had high cholestrol" (06/16/2016)  . HTN (hypertension)    "on RX; never HTN" (06/16/2016)  . Migraine    "in high school" (06/16/2016)    Patient Active Problem List   Diagnosis Date Noted  . Hip fracture ([place]) 11/22/2019  . Pain due to onychomycosis of toenails of both feet 11/05/2019  . Vascular dementia with behavior disturbance ([place]) 10/17/2019  . Dementia without behavioral disturbance ([place]) 05/13/2019  . Bilateral impacted cerumen 11/29/2018  . Presbycusis of both ears 11/29/2018  . Vasomotor rhinitis 11/29/2018  . S/P inguinal hernia repair 12/22/2016  . CAD (coronary artery disease) 06/16/2016  . Coronary artery disease involving native  coronary artery of native heart with angina pectoris ([place]) 06/16/2016  . Benign hypertrophy of prostate 03/04/2014  . Hyperlipidemia 02/17/2010  . Essential hypertension 02/17/2010  . CAD, NATIVE VESSEL  (06/16/2016): a. sev sten of mid right coronary art PTCA and DES , severe prox LAD stenosis unsucc PTCA, widely patent left main and left circ 11/14/2009  . FATIGUE 11/12/2009  . ARM NUMBNESS 11/12/2009  . DYSPNEA 11/12/2009    Past Surgical History:  Procedure Laterality Date  . BASAL CELL CARCINOMA EXCISION     "one of my ears; big one on my nose; one on my left shoulder" (06/16/2016)  . CARDIAC CATHETERIZATION  2017   "treated w/RX"    . CATARACT EXTRACTION W/ INTRAOCULAR LENS  IMPLANT, BILATERAL Bilateral   . CORONARY ANGIOPLASTY WITH STENT PLACEMENT  06/16/2016  . CORONARY BALLOON ANGIOPLASTY N/A 06/16/2016   Procedure: Coronary Balloon Angioplasty;  Surgeon: Sherren Mocha, MD;  Location: [facility];  Service: Cardiovascular;  Laterality: N/A;  Prox LAD  . CORONARY STENT INTERVENTION  06/16/2016   Procedure: Coronary Stent Intervention;  Surgeon: Sherren Mocha, MD;  Location: [facility];  Service: Cardiovascular;;  Synergy 4.0x16 to Mid RCA  . INGUINAL HERNIA REPAIR Left 12/22/2016   Procedure: REPAIR OF LEFT INGUINAL HERNIA WITH MESH;  Surgeon: Georganna Skeans, MD;  Location: [facility];  Service: General;  Laterality: Left;  . INGUINAL HERNIA REPAIR Left 10/09/2019   Procedure: LAPAROSCOPIC LEFT INGUINAL HERNIA REPAIR WITH MESH;  Surgeon: Ralene Ok, MD;  Location: [facility];  Service: General;  Laterality: Left;  . INSERTION OF MESH Left 12/22/2016   Procedure: INSERTION OF MESH;  Surgeon: Georganna Skeans, MD;  Location: [facility];  Service: General;  Laterality: Left;  . INTRAVASCULAR PRESSURE WIRE/FFR STUDY N/A 06/16/2016   Procedure: Intravascular Pressure Wire/FFR Study;  Surgeon: Sherren Mocha, MD;  Location: [facility];  Service: Cardiovascular;  Laterality: N/A;  Prox LAD  . KNEE ARTHROSCOPY     "not sure which side"  . LEFT HEART CATH AND CORONARY ANGIOGRAPHY N/A 06/16/2016   Procedure: Left Heart Cath and Coronary Angiography;  Surgeon: Sherren Mocha, MD;  Location: [facility];  Service: Cardiovascular;  Laterality: N/A;  . TONSILLECTOMY    . TRANSURETHRAL RESECTION OF PROSTATE N/A 03/04/2014   Procedure: TRANSURETHRAL RESECTION OF THE PROSTATE, TRANSURETHRAL RESECTION OF THE BLADDER TUMOR;  Surgeon: Ailene Rud, MD;  Location: [facility];  Service: Urology;  Laterality: N/A;       Family History  Problem Relation Age of Onset  . Sudden death Mother        sudden cardiac  arrest  . COPD Father   . Heart failure Father        CHF  . Emphysema Father   . Parkinson's disease Maternal Uncle   . Dementia Neg Hx   . Alzheimer's disease Neg Hx     Social History   Tobacco Use  . Smoking status: Former Smoker    Packs/day: 1.00    Years: 18.00    Pack years: 18.00    Quit date: 1963    Years since quitting: 58.9  . Smokeless tobacco: Never Used  Vaping Use  . Vaping Use: Never used  Substance Use Topics  . Alcohol use: Never  . Drug use: Never    Home Medications Prior to Admission medications   Medication Sig Start Date End Date Taking? Authorizing Provider  acetaminophen (TYLENOL) 500 MG tablet Take 1,000 mg by  mouth every 6 (six) hours as needed for moderate pain.   Yes [provider]  aspirin EC 81 MG tablet Take 81 mg by mouth every evening.    Yes [provider]  diclofenac (VOLTAREN) 75 MG EC tablet Take 75 mg by mouth 2 (two) times daily as needed for moderate pain.  10/03/19  Yes [provider]  latanoprost (XALATAN) 0.005 % ophthalmic solution Place 1 drop into both eyes at bedtime.    Yes [provider]  levocetirizine (XYZAL) 5 MG tablet Take 5 mg by mouth every evening.    Yes [provider]  lisinopril (ZESTRIL) 10 MG tablet TAKE 1 TABLET BY MOUTH DAILY. Patient taking differently: Take 10 mg by mouth daily.  06/13/19  Yes Sherren Mocha, MD  nitroGLYCERIN (NITROSTAT) 0.4 MG SL tablet PLACE 1 TABLET UNDER THE TONGUE EVERY 5 MINUTES AS NEEDED FOR CHEST PAIN. Patient taking differently: Place 0.4 mg under the tongue every 5 (five) minutes x 3 doses as needed for chest pain.  05/10/19  Yes Sherren Mocha, MD  simvastatin (ZOCOR) 20 MG tablet TAKE 1 TABLET BY MOUTH DAILY. Patient taking differently: Take 20 mg by mouth at bedtime.  05/22/19  Yes Sherren Mocha, MD  tiZANidine (ZANAFLEX) 2 MG tablet Take 2 mg by mouth daily as needed for muscle spasms.  10/03/19  Yes [provider]    vitamin B-12 (CYANOCOBALAMIN) 1000 MCG tablet Take 1,000 mcg by mouth daily.   Yes [provider]  memantine (NAMENDA) 10 MG tablet Start with one pill in the morning. In 2 weeks can increase to one pill twice daily.Stop for any side effects. Patient not taking: Reported on 11/22/2019 10/17/19   Melvenia Beam, MD    Allergies    Amoxicillin, Sulfonamide derivatives, and Sulfamethoxazole  Review of Systems   Review of Systems Ten systems are reviewed and are negative for acute change except as noted in the HPI  Physical Exam Updated Vital Signs BP 131/62   Pulse 60   Temp 98.2 F (36.8 C) (Oral)   Resp 19   SpO2 100%   Physical Exam Constitutional:      General: He is not in acute distress.    Appearance: Normal appearance. He is well-developed. He is not ill-appearing or diaphoretic.  HENT:     Head: Normocephalic. Contusion present.     Jaw: There is normal jaw occlusion. No trismus.      Mouth/Throat:     Mouth: Mucous membranes are moist.     Pharynx: Oropharynx is clear.  Eyes:     General: Vision grossly intact. Gaze aligned appropriately.     Extraocular Movements: Extraocular movements intact.     Pupils: Pupils are equal, round, and reactive to light.  Neck:     Trachea: Trachea and phonation normal. No tracheal tenderness or tracheal deviation.  Cardiovascular:     Rate and Rhythm: Normal rate and regular rhythm.     Pulses:          Dorsalis pedis pulses are 2+ on the right side and 2+ on the left side.  Pulmonary:     Effort: Pulmonary effort is normal. No respiratory distress.  Chest:     Chest wall: No deformity, tenderness or crepitus.  Abdominal:     General: There is no distension.     Palpations: Abdomen is soft.     Tenderness: There is no abdominal tenderness. There is no guarding or rebound.  Musculoskeletal:  General: Normal range of motion.     Cervical back: Normal range of motion. No spinous process tenderness or muscular  tenderness.     Comments: Minimal tenderness left hip.  Pedal pulses sensation intact.  Capillary refill intact.  All other major joints palpated without pain or deformity bilaterally. No midline C/T/L spinal tenderness to palpation, no paraspinal muscle tenderness, no deformity, crepitus, or step-off noted.  Skin:    General: Skin is warm and dry.  Neurological:     Mental Status: He is alert.     GCS: GCS eye subscore is 4. GCS verbal subscore is 5. GCS motor subscore is 6.     Comments: Speech is clear and goal oriented, follows commands Major Cranial nerves without deficit, no facial droop Moves extremities without ataxia, coordination intact  Psychiatric:        Behavior: Behavior normal.     ED Results / Procedures / Treatments   Labs (all labs ordered are listed, but only abnormal results are displayed) Labs Reviewed  CBC WITH DIFFERENTIAL/PLATELET - Abnormal; Notable for the following components:      Result Value   RBC 3.36 (*)    Hemoglobin 9.6 (*)    HCT 31.4 (*)    All other components within normal limits  BASIC METABOLIC PANEL - Abnormal; Notable for the following components:   Glucose, Bld 108 (*)    All other components within normal limits  RESPIRATORY PANEL BY RT PCR (FLU A&B, COVID)    EKG None  Radiology CT Head Wo Contrast  Result Date: 11/22/2019 CLINICAL DATA:  Head injury and neck pain after fall. EXAM: CT HEAD WITHOUT CONTRAST CT CERVICAL SPINE WITHOUT CONTRAST TECHNIQUE: Multidetector CT imaging of the head and cervical spine was performed following the standard protocol without intravenous contrast. Multiplanar CT image reconstructions of the cervical spine were also generated. COMPARISON:  September 05, 2013. FINDINGS: CT HEAD FINDINGS Brain: Mild chronic ischemic white matter disease is noted. Mild diffuse cortical atrophy is noted. No mass effect or midline shift is noted. Ventricular size is within normal limits. There is no evidence of mass lesion,  hemorrhage or acute infarction. Vascular: No hyperdense vessel or unexpected calcification. Skull: Normal. Negative for fracture or focal lesion. Sinuses/Orbits: No acute finding. Other: None. CT CERVICAL SPINE FINDINGS Alignment: Minimal grade 1 retrolisthesis of C5-6 is noted secondary to severe degenerative disc disease at this level. Skull base and vertebrae: No acute fracture. No primary bone lesion or focal pathologic process. Soft tissues and spinal canal: No prevertebral fluid or swelling. No visible canal hematoma. Disc levels: Severe degenerative disc disease is noted at C4-5, C5-6, C6-7 and C7-T1. Upper chest: 11 x 6 mm irregular nodule is seen medially in right upper lobe with pleural tail concerning for possible neoplasm. Other: None. IMPRESSION: 1. Mild chronic ischemic white matter disease. Mild diffuse cortical atrophy. No acute intracranial abnormality seen. 2. Severe multilevel degenerative disc disease. No acute abnormality seen in the cervical spine. 3. 11 x 6 mm irregular nodule is seen medially in right upper lobe with pleural tail concerning for possible neoplasm. CT scan of the chest is recommended for further evaluation. Electronically Signed   By: Marijo Conception M.D.   On: 11/22/2019 13:22   CT Cervical Spine Wo Contrast  Result Date: 11/22/2019 CLINICAL DATA:  Head injury and neck pain after fall. EXAM: CT HEAD WITHOUT CONTRAST CT CERVICAL SPINE WITHOUT CONTRAST TECHNIQUE: Multidetector CT imaging of the head and cervical spine was performed  following the standard protocol without intravenous contrast. Multiplanar CT image reconstructions of the cervical spine were also generated. COMPARISON:  September 05, 2013. FINDINGS: CT HEAD FINDINGS Brain: Mild chronic ischemic white matter disease is noted. Mild diffuse cortical atrophy is noted. No mass effect or midline shift is noted. Ventricular size is within normal limits. There is no evidence of mass lesion, hemorrhage or acute  infarction. Vascular: No hyperdense vessel or unexpected calcification. Skull: Normal. Negative for fracture or focal lesion. Sinuses/Orbits: No acute finding. Other: None. CT CERVICAL SPINE FINDINGS Alignment: Minimal grade 1 retrolisthesis of C5-6 is noted secondary to severe degenerative disc disease at this level. Skull base and vertebrae: No acute fracture. No primary bone lesion or focal pathologic process. Soft tissues and spinal canal: No prevertebral fluid or swelling. No visible canal hematoma. Disc levels: Severe degenerative disc disease is noted at C4-5, C5-6, C6-7 and C7-T1. Upper chest: 11 x 6 mm irregular nodule is seen medially in right upper lobe with pleural tail concerning for possible neoplasm. Other: None. IMPRESSION: 1. Mild chronic ischemic white matter disease. Mild diffuse cortical atrophy. No acute intracranial abnormality seen. 2. Severe multilevel degenerative disc disease. No acute abnormality seen in the cervical spine. 3. 11 x 6 mm irregular nodule is seen medially in right upper lobe with pleural tail concerning for possible neoplasm. CT scan of the chest is recommended for further evaluation. Electronically Signed   By: Marijo Conception M.D.   On: 11/22/2019 13:22    Procedures Procedures (including critical care time)  Medications Ordered in ED Medications - No data to display  ED Course  I have reviewed the triage vital signs and the nursing notes.  Pertinent labs & imaging results that were available during my care of the patient were reviewed by me and considered in my medical decision making (see chart for details).  Clinical Course as of Nov 21 1532  Thu Nov 22, 2019  1155 Dr. Ricki Rodriguez: Left femoral neck fracture displaced.  Admit to hospitalist.  Ortho team to round later today.  Plan for surgery this weekend.   [BM]  [place] Dr. Neysa Bonito   [BM]    Clinical Course User Index [BM] Gari Crown   MDM Rules/Calculators/A&P                           Additional history obtained from: 1. Nursing notes from this visit. 2. Review of electronic medical records, no pertinent recent ED visits.  Unable to access orthopedics office records. 3. Family at bedside. ----------------- 86 year old male arrived from orthopedic office for hospital admission after suffering a left hip fracture after fall around a week ago.  He has been walking on it since that time but pain worsened and he is having difficulty ambulating now.  He has had for total falls in the last week and patient and family report these appear to be due to uneven terrain and leaning over, he has no preceding chest pain shortness of breath dizziness/lightheadedness and no syncope.  Given multiple falls and also hitting head yesterday will obtain CT head cervical spine.  Will obtain basic labs including CBC, BMP and Covid for admission.  Consult placed to orthopedist.  Patient has small bruise to his face without associated pain.  No evidence of significant injury of the head neck back chest or abdomen.  Does have left hip pain otherwise extremities without evidence of severe traumatic injury.  No additional imaging  indicated at this time. - Discussed case with Dr. Wynelle Link, advises patient has a left femoral neck fracture which is displaced and they do not need any additional imaging here in the emergency department.  They asked that patient be admitted to hospitalist service and the orthopedic team will round on the patient later on today.  They plan for surgery sometime at the end of this week or weekend. - I ordered, reviewed and interpreted labs which include: CBC without leukocytosis to suggest infection, anemia of 9.6 appears baseline. BMP shows no emergent Electra derangement, AKI or gap. Covid/influenza panel negative.  CT Head/C-Spine:    IMPRESSION:  1. Mild chronic ischemic white matter disease. Mild diffuse cortical  atrophy. No acute intracranial abnormality seen.  2. Severe  multilevel degenerative disc disease. No acute abnormality  seen in the cervical spine.  3. 11 x 6 mm irregular nodule is seen medially in right upper lobe  with pleural tail concerning for possible neoplasm. CT scan of the  chest is recommended for further evaluation.  - I discussed findings above with patient and his son [name] who is at bedside.  They state understanding and have no questions, I advised him of the irregular nodule seen and need for follow-up CT scan of the chest and they state understanding - I discussed the case with Dr. Neysa Bonito, patient has been accepted to hospitalist service.  Case was discussed with Dr. Roslynn Amble during this visit.  Note: Portions of this report may have been transcribed using voice recognition software. Every effort was made to ensure accuracy; however, inadvertent computerized transcription errors may still be present. Final Clinical Impression(s) / ED Diagnoses Final diagnoses:  Closed fracture of left hip, initial encounter [facility]  Pulmonary nodule    Rx / DC Orders ED Discharge Orders    None       Gari Crown 11/22/19 1436    Gari Crown 11/22/19 1535    Lucrezia Starch, MD 11/24/19 1351

## 2019-11-22 NOTE — H&P (Addendum)
History and Physical        Hospital Admission Note Date: 11/22/2019  Patient name: Jesus Harmon Medical record number: [id number] Date of birth: 1929-11-05 Age: 86 y.o. Gender: male  PCP: Alroy Dust, L.Marlou Sa, MD  Patient coming from: home Lives with: wife At baseline, ambulates: [place]  Chief Complaint    Chief Complaint  Patient presents with  . Fall  . Hip Pain      HPI:   This is an 86 year old male with a history of CAD s/p stent, HFpEF, vascular dementia, Hypertension, hyperlipidemia who was sent in from [facility] office for left hip fracture. Patient had a fall onto his left hip several days ago resulting in pain most notably with ambulation. [place] due to poor balance on uneven ground outside. He has fallen multiple times over the past several weeks for similar reasons without LOC and had multiple falls this week. Most recent fall was last night when he was sitting in his chair and leaning over to pick something up off the ground when he lost his balance and struck the back of his head on the wall. No LOC, dizziness, SOB, numbness/weakness or other potential causes to his falls. Struck his right cheek on the ground and suffered a small bruise several weeks ago. His falls prompted him to go to the orthopedic urgent care today with his daughter where he was found to have a left hip fracture by Xray and was sent to [facility] by ortho. Denies any chest pain, palpitations, nausea, vomiting or any other complaints.   ED Course: afebrile, hemodynamically stable on room air. Labs overall unremarkable. CT head with chronic ischemic white matter disease and other chronic issues but no acute findings. CT Cervical spine with severe multilevel DDD and 11x6 mm irregular nodule seen in RUL medially with pleural tail concerning for possible neoplasm.   Vitals:   11/22/19 1218 11/22/19 1334  BP:  (!) 143/81 (!) 119/57  Pulse: 64 67  Resp: 18 20  Temp:    SpO2: 100% 99%     Review of Systems:  Review of Systems  All other systems reviewed and are negative.   Medical/Social/Family History   Past Medical History: Past Medical History:  Diagnosis Date  . Arthritis    back and neck (06/16/2016)  . Bladder cancer ([place])   . Blockage of coronary artery of heart (HCC)    "widow maker", had stenting of the R side but L main is still blocked   . CAD (coronary artery disease)    a. LHC 10/2009:  pLAD 50%, small D1 70-80% (too small for PCI), EF 65% => med Rx  b. Myoview 10/2009: EF 65%, no ischemia;  c.  ETT-MV 3/14:  No ischemia, EF 63%  . Cancer [facility])    sin cancer removed nose   . Diverticulosis   . Glaucoma   . History of echocardiogram    Echo 02/2018: EF 45-85, +diastolic dysfunction, no RWMA, normal RVSF, trivial MR  . History of kidney stones    "passed them"  . HLD (hyperlipidemia)    "on RX; never had high cholestrol" (06/16/2016)  . HTN (hypertension)    "on RX; never HTN" (06/16/2016)  .  Migraine    "in high school" (06/16/2016)    Past Surgical History:  Procedure Laterality Date  . BASAL CELL CARCINOMA EXCISION     "one of my ears; big one on my nose; one on my left shoulder" (06/16/2016)  . CARDIAC CATHETERIZATION  2017   "treated w/RX"  . CATARACT EXTRACTION W/ INTRAOCULAR LENS  IMPLANT, BILATERAL Bilateral   . CORONARY ANGIOPLASTY WITH STENT PLACEMENT  06/16/2016  . CORONARY BALLOON ANGIOPLASTY N/A 06/16/2016   Procedure: Coronary Balloon Angioplasty;  Surgeon: Sherren Mocha, MD;  Location: [facility];  Service: Cardiovascular;  Laterality: N/A;  Prox LAD  . CORONARY STENT INTERVENTION  06/16/2016   Procedure: Coronary Stent Intervention;  Surgeon: Sherren Mocha, MD;  Location: [facility];  Service: Cardiovascular;;  Synergy 4.0x16 to Mid RCA  . INGUINAL HERNIA REPAIR Left 12/22/2016   Procedure: REPAIR OF LEFT INGUINAL HERNIA WITH MESH;   Surgeon: Georganna Skeans, MD;  Location: [facility];  Service: General;  Laterality: Left;  . INGUINAL HERNIA REPAIR Left 10/09/2019   Procedure: LAPAROSCOPIC LEFT INGUINAL HERNIA REPAIR WITH MESH;  Surgeon: Ralene Ok, MD;  Location: [facility];  Service: General;  Laterality: Left;  . INSERTION OF MESH Left 12/22/2016   Procedure: INSERTION OF MESH;  Surgeon: Georganna Skeans, MD;  Location: [facility];  Service: General;  Laterality: Left;  . INTRAVASCULAR PRESSURE WIRE/FFR STUDY N/A 06/16/2016   Procedure: Intravascular Pressure Wire/FFR Study;  Surgeon: Sherren Mocha, MD;  Location: [facility];  Service: Cardiovascular;  Laterality: N/A;  Prox LAD  . KNEE ARTHROSCOPY     "not sure which side"  . LEFT HEART CATH AND CORONARY ANGIOGRAPHY N/A 06/16/2016   Procedure: Left Heart Cath and Coronary Angiography;  Surgeon: Sherren Mocha, MD;  Location: [facility];  Service: Cardiovascular;  Laterality: N/A;  . TONSILLECTOMY    . TRANSURETHRAL RESECTION OF PROSTATE N/A 03/04/2014   Procedure: TRANSURETHRAL RESECTION OF THE PROSTATE, TRANSURETHRAL RESECTION OF THE BLADDER TUMOR;  Surgeon: Ailene Rud, MD;  Location: [facility];  Service: Urology;  Laterality: N/A;    Medications: Prior to Admission medications   Medication Sig Start Date End Date Taking? Authorizing Provider  acetaminophen (TYLENOL) 500 MG tablet Take 1,000 mg by mouth every 6 (six) hours as needed for moderate pain.   Yes [provider]  aspirin EC 81 MG tablet Take 81 mg by mouth every evening.    Yes [provider]  diclofenac (VOLTAREN) 75 MG EC tablet Take 75 mg by mouth 2 (two) times daily as needed for moderate pain.  10/03/19  Yes [provider]  latanoprost (XALATAN) 0.005 % ophthalmic solution Place 1 drop into both eyes at bedtime.    Yes [provider]  levocetirizine (XYZAL) 5 MG tablet Take 5 mg by mouth every evening.    Yes [provider]  lisinopril  (ZESTRIL) 10 MG tablet TAKE 1 TABLET BY MOUTH DAILY. Patient taking differently: Take 10 mg by mouth daily.  06/13/19  Yes Sherren Mocha, MD  nitroGLYCERIN (NITROSTAT) 0.4 MG SL tablet PLACE 1 TABLET UNDER THE TONGUE EVERY 5 MINUTES AS NEEDED FOR CHEST PAIN. Patient taking differently: Place 0.4 mg under the tongue every 5 (five) minutes x 3 doses as needed for chest pain.  05/10/19  Yes Sherren Mocha, MD  simvastatin (ZOCOR) 20 MG tablet TAKE 1 TABLET BY MOUTH DAILY. Patient taking differently: Take 20 mg by mouth at bedtime.  05/22/19  Yes Sherren Mocha,  MD  tiZANidine (ZANAFLEX) 2 MG tablet Take 2 mg by mouth daily as needed for muscle spasms.  10/03/19  Yes [provider]  vitamin B-12 (CYANOCOBALAMIN) 1000 MCG tablet Take 1,000 mcg by mouth daily.   Yes [provider]  memantine (NAMENDA) 10 MG tablet Start with one pill in the morning. In 2 weeks can increase to one pill twice daily.Stop for any side effects. Patient not taking: Reported on 11/22/2019 10/17/19   Melvenia Beam, MD    Allergies:   Allergies  Allergen Reactions  . Amoxicillin Swelling, Rash and Other (See Comments)    Swelling and rash to face Has patient had a PCN reaction causing immediate rash, facial/tongue/throat swelling, SOB or lightheadedness with hypotension: Yes Has patient had a PCN reaction causing severe rash involving mucus membranes or skin necrosis: No Has patient had a PCN reaction that required hospitalization: No Has patient had a PCN reaction occurring within the last 10 years: Yes If all of the above answers are "NO", then may proceed with Cephalosporin use.    . Sulfonamide Derivatives Hives  . Sulfamethoxazole Hives and Other (See Comments)    Social History:  reports that he quit smoking about 58 years ago. He has a 18.00 pack-year smoking history. He has never used smokeless tobacco. He reports that he does not drink alcohol and does not use drugs.  Family  History: Family History  Problem Relation Age of Onset  . Sudden death Mother        sudden cardiac arrest  . COPD Father   . Heart failure Father        CHF  . Emphysema Father   . Parkinson's disease Maternal Uncle   . Dementia Neg Hx   . Alzheimer's disease Neg Hx      Objective   Physical Exam: Blood pressure (!) 119/57, pulse 67, temperature 98.2 F (36.8 C), temperature source Oral, resp. rate 20, SpO2 99 %.  Physical Exam Vitals and nursing note reviewed. Exam conducted with a chaperone present.  Constitutional:      Appearance: Normal appearance.  HENT:     Head: Normocephalic.     Comments: ecchymosis underright eye Eyes:     Conjunctiva/sclera: Conjunctivae normal.  Cardiovascular:     Rate and Rhythm: Normal rate and regular rhythm.  Pulmonary:     Effort: Pulmonary effort is normal.     Breath sounds: Normal breath sounds.  Abdominal:     General: Abdomen is flat.     Palpations: Abdomen is soft.  Musculoskeletal:     Right lower leg: No edema.     Left lower leg: No edema.     Comments: Left hip discomfort.  Skin:    Coloration: Skin is not jaundiced or pale.  Neurological:     Mental Status: He is alert. Mental status is at baseline.  Psychiatric:        Mood and Affect: Mood normal.        Behavior: Behavior normal.     LABS on Admission: I have personally reviewed all the labs and imaging below    Basic Metabolic Panel: Recent Labs  Lab 11/22/19 1212  NA 139  K 3.9  CL 103  CO2 26  GLUCOSE 108*  BUN 19  CREATININE 1.02  CALCIUM 9.1   Liver Function Tests: No results for input(s): AST, ALT, ALKPHOS, BILITOT, PROT, ALBUMIN in the last 168 hours. No results for input(s): LIPASE, AMYLASE in the last 168 hours.  No results for input(s): AMMONIA in the last 168 hours. CBC: Recent Labs  Lab 11/22/19 1212  WBC 8.4  NEUTROABS 6.2  HGB 9.6*  HCT 31.4*  MCV 93.5  PLT 357   Cardiac Enzymes: No results for input(s): CKTOTAL, CKMB,  CKMBINDEX, TROPONINI in the last 168 hours. BNP: Invalid input(s): POCBNP CBG: No results for input(s): GLUCAP in the last 168 hours.  Radiological Exams on Admission:  CT Head Wo Contrast  Result Date: 11/22/2019 CLINICAL DATA:  Head injury and neck pain after fall. EXAM: CT HEAD WITHOUT CONTRAST CT CERVICAL SPINE WITHOUT CONTRAST TECHNIQUE: Multidetector CT imaging of the head and cervical spine was performed following the standard protocol without intravenous contrast. Multiplanar CT image reconstructions of the cervical spine were also generated. COMPARISON:  September 05, 2013. FINDINGS: CT HEAD FINDINGS Brain: Mild chronic ischemic white matter disease is noted. Mild diffuse cortical atrophy is noted. No mass effect or midline shift is noted. Ventricular size is within normal limits. There is no evidence of mass lesion, hemorrhage or acute infarction. Vascular: No hyperdense vessel or unexpected calcification. Skull: Normal. Negative for fracture or focal lesion. Sinuses/Orbits: No acute finding. Other: None. CT CERVICAL SPINE FINDINGS Alignment: Minimal grade 1 retrolisthesis of C5-6 is noted secondary to severe degenerative disc disease at this level. Skull base and vertebrae: No acute fracture. No primary bone lesion or focal pathologic process. Soft tissues and spinal canal: No prevertebral fluid or swelling. No visible canal hematoma. Disc levels: Severe degenerative disc disease is noted at C4-5, C5-6, C6-7 and C7-T1. Upper chest: 11 x 6 mm irregular nodule is seen medially in right upper lobe with pleural tail concerning for possible neoplasm. Other: None. IMPRESSION: 1. Mild chronic ischemic white matter disease. Mild diffuse cortical atrophy. No acute intracranial abnormality seen. 2. Severe multilevel degenerative disc disease. No acute abnormality seen in the cervical spine. 3. 11 x 6 mm irregular nodule is seen medially in right upper lobe with pleural tail concerning for possible neoplasm.  CT scan of the chest is recommended for further evaluation. Electronically Signed   By: Marijo Conception M.D.   On: 11/22/2019 13:22   CT Cervical Spine Wo Contrast  Result Date: 11/22/2019 CLINICAL DATA:  Head injury and neck pain after fall. EXAM: CT HEAD WITHOUT CONTRAST CT CERVICAL SPINE WITHOUT CONTRAST TECHNIQUE: Multidetector CT imaging of the head and cervical spine was performed following the standard protocol without intravenous contrast. Multiplanar CT image reconstructions of the cervical spine were also generated. COMPARISON:  September 05, 2013. FINDINGS: CT HEAD FINDINGS Brain: Mild chronic ischemic white matter disease is noted. Mild diffuse cortical atrophy is noted. No mass effect or midline shift is noted. Ventricular size is within normal limits. There is no evidence of mass lesion, hemorrhage or acute infarction. Vascular: No hyperdense vessel or unexpected calcification. Skull: Normal. Negative for fracture or focal lesion. Sinuses/Orbits: No acute finding. Other: None. CT CERVICAL SPINE FINDINGS Alignment: Minimal grade 1 retrolisthesis of C5-6 is noted secondary to severe degenerative disc disease at this level. Skull base and vertebrae: No acute fracture. No primary bone lesion or focal pathologic process. Soft tissues and spinal canal: No prevertebral fluid or swelling. No visible canal hematoma. Disc levels: Severe degenerative disc disease is noted at C4-5, C5-6, C6-7 and C7-T1. Upper chest: 11 x 6 mm irregular nodule is seen medially in right upper lobe with pleural tail concerning for possible neoplasm. Other: None. IMPRESSION: 1. Mild chronic ischemic white matter disease. Mild diffuse cortical  atrophy. No acute intracranial abnormality seen. 2. Severe multilevel degenerative disc disease. No acute abnormality seen in the cervical spine. 3. 11 x 6 mm irregular nodule is seen medially in right upper lobe with pleural tail concerning for possible neoplasm. CT scan of the chest is  recommended for further evaluation. Electronically Signed   By: Marijo Conception M.D.   On: 11/22/2019 13:22      EKG: Not done   A & P   Principal Problem:   Hip fracture (HCC) Active Problems:   Hyperlipidemia   Essential hypertension   CAD (coronary artery disease)   1. Left hip fracture s/p multiple falls  a. Sent in by outpatient ortho b. Holding Aspirin c. Pain regimen per hip fracture order set  d. Plan per ortho e. Cardiology consult for cardiac clearance  2. CAD s/p DES in 2018 a. Cardiac cath 06/16/16: severe stenosis of mid RCA s/p PTCA and DES, severe proximal LAD stenosis with unsuccessful angioplasty b. Holding aspirin for now c. Continue statin d. Cardiology consulted as above  3. Hypertension a. Continue home lisinopril  4. Hyperlipidemia a. Continue home Zocor  5. Pulmonary nodule concerning for neoplasm a. Noted on CT scan b. Consider outpatient follow up   DVT prophylaxis: Lovenox   Code Status: Prior  Diet:NPO after midnight Family Communication: Admission, patients condition and plan of care including tests being ordered have been discussed with the patient who indicates understanding and agrees with the plan and Code Status. Patient's daughter was updated  Disposition Plan: The appropriate patient status for this patient is INPATIENT. Inpatient status is judged to be reasonable and necessary in order to provide the required intensity of service to ensure the patient's safety. The patient's presenting symptoms, physical exam findings, and initial radiographic and laboratory data in the context of their chronic comorbidities is felt to place them at high risk for further clinical deterioration. Furthermore, it is not anticipated that the patient will be medically stable for discharge from the hospital within 2 midnights of admission. The following factors support the patient status of inpatient.   " The patient's presenting symptoms include falls,  left hip pain. " The worrisome physical exam findings include left hip pain. " The initial radiographic and laboratory data are worrisome because of left hip fracture at outpatient facility. " The chronic co-morbidities include vascular dementia, CAD, hypertension, hyperlipidemia.   * I certify that at the point of admission it is my clinical judgment that the patient will require inpatient hospital care spanning beyond 2 midnights from the point of admission due to high intensity of service, high risk for further deterioration and high frequency of surveillance required.*   Status is: Inpatient  Remains inpatient appropriate because:Ongoing active pain requiring inpatient pain management and Inpatient level of care appropriate due to severity of illness   Dispo: The patient is from: Home              Anticipated d/c is to: SNF              Anticipated d/c date is: 3 days              Patient currently is not medically stable to d/c.    Consultants  . Ortho . Cardiology  Procedures  . none  Time Spent on Admission: 64 minutes    Harold Hedge, DO [facility]  11/22/2019, 2:36 PM

## 2019-11-23 ENCOUNTER — Inpatient Hospital Stay (HOSPITAL_COMMUNITY): Payer: Medicare Other

## 2019-11-23 ENCOUNTER — Encounter (HOSPITAL_COMMUNITY)
Admission: EM | Disposition: A | Discharge status: Home Health | Payer: Self-pay | Source: Home / Self Care | Attending: Internal Medicine

## 2019-11-23 ENCOUNTER — Inpatient Hospital Stay (HOSPITAL_COMMUNITY): Payer: Medicare Other | Admitting: Certified Registered Nurse Anesthetist

## 2019-11-23 ENCOUNTER — Encounter (HOSPITAL_COMMUNITY): Payer: Self-pay | Admitting: Internal Medicine

## 2019-11-23 DIAGNOSIS — E538 Deficiency of other specified B group vitamins: Secondary | ICD-10-CM | POA: Diagnosis present

## 2019-11-23 DIAGNOSIS — D649 Anemia, unspecified: Secondary | ICD-10-CM | POA: Diagnosis present

## 2019-11-23 DIAGNOSIS — I2510 Atherosclerotic heart disease of native coronary artery without angina pectoris: Secondary | ICD-10-CM

## 2019-11-23 DIAGNOSIS — S72002A Fracture of unspecified part of neck of left femur, initial encounter for closed fracture: Secondary | ICD-10-CM | POA: Diagnosis not present

## 2019-11-23 DIAGNOSIS — R911 Solitary pulmonary nodule: Secondary | ICD-10-CM | POA: Diagnosis present

## 2019-11-23 DIAGNOSIS — E7800 Pure hypercholesterolemia, unspecified: Secondary | ICD-10-CM

## 2019-11-23 DIAGNOSIS — I10 Essential (primary) hypertension: Secondary | ICD-10-CM

## 2019-11-23 HISTORY — PX: TOTAL HIP ARTHROPLASTY: SHX124

## 2019-11-23 LAB — CBC WITH DIFFERENTIAL/PLATELET
Abs Immature Granulocytes: 0.09 10*3/uL — ABNORMAL HIGH (ref 0.00–0.07)
Basophils Absolute: 0 10*3/uL (ref 0.0–0.1)
Basophils Relative: 0 %
Eosinophils Absolute: 0.1 10*3/uL (ref 0.0–0.5)
Eosinophils Relative: 1 %
HCT: 29.9 % — ABNORMAL LOW (ref 39.0–52.0)
Hemoglobin: 9.3 g/dL — ABNORMAL LOW (ref 13.0–17.0)
Immature Granulocytes: 1 %
Lymphocytes Relative: 10 %
Lymphs Abs: 0.8 10*3/uL (ref 0.7–4.0)
MCH: 29.1 pg (ref 26.0–34.0)
MCHC: 31.1 g/dL (ref 30.0–36.0)
MCV: 93.4 fL (ref 80.0–100.0)
Monocytes Absolute: 0.6 10*3/uL (ref 0.1–1.0)
Monocytes Relative: 8 %
Neutro Abs: 6.3 10*3/uL (ref 1.7–7.7)
Neutrophils Relative %: 80 %
Platelets: 357 10*3/uL (ref 150–400)
RBC: 3.2 MIL/uL — ABNORMAL LOW (ref 4.22–5.81)
RDW: 13.2 % (ref 11.5–15.5)
WBC: 7.9 10*3/uL (ref 4.0–10.5)
nRBC: 0 % (ref 0.0–0.2)

## 2019-11-23 LAB — BASIC METABOLIC PANEL
Anion gap: 10 (ref 5–15)
BUN: 19 mg/dL (ref 8–23)
CO2: 24 mmol/L (ref 22–32)
Calcium: 8.9 mg/dL (ref 8.9–10.3)
Chloride: 104 mmol/L (ref 98–111)
Creatinine, Ser: 0.84 mg/dL (ref 0.61–1.24)
GFR, Estimated: >60 mL/min (ref >60)
Glucose, Bld: 102 mg/dL — ABNORMAL HIGH (ref 70–99)
Potassium: 4.1 mmol/L (ref 3.5–5.1)
Sodium: 138 mmol/L (ref 135–145)

## 2019-11-23 LAB — FERRITIN: Ferritin: 118 ng/mL (ref 24–336)

## 2019-11-23 LAB — TYPE AND SCREEN
ABO/RH(D): A POS
Antibody Screen: NEGATIVE

## 2019-11-23 LAB — ABO/RH: ABO/RH(D): A POS

## 2019-11-23 LAB — IRON AND TIBC
Iron: 56 ug/dL (ref 45–182)
Saturation Ratios: 22 % (ref 17.9–39.5)
TIBC: 258 ug/dL (ref 250–450)
UIBC: 202 ug/dL

## 2019-11-23 LAB — RETICULOCYTES
Immature Retic Fract: 7.8 % (ref 2.3–15.9)
RBC.: 3.12 MIL/uL — ABNORMAL LOW (ref 4.22–5.81)
Retic Count, Absolute: 31.5 10*3/uL (ref 19.0–186.0)
Retic Ct Pct: 1 % (ref 0.4–3.1)

## 2019-11-23 LAB — FOLATE: Folate: 12.1 ng/mL (ref >5.9)

## 2019-11-23 LAB — MAGNESIUM: Magnesium: 2 mg/dL (ref 1.7–2.4)

## 2019-11-23 LAB — VITAMIN B12: Vitamin B-12: 794 pg/mL (ref 180–914)

## 2019-11-23 SURGERY — ARTHROPLASTY, HIP, TOTAL, ANTERIOR APPROACH
Anesthesia: Monitor Anesthesia Care | Site: Hip | Laterality: Left

## 2019-11-23 MED ORDER — KETOROLAC TROMETHAMINE 30 MG/ML IJ SOLN
INTRAMUSCULAR | Status: AC
  Filled 2019-11-23: qty 1

## 2019-11-23 MED ORDER — POVIDONE-IODINE 10 % EX SWAB: 2.0000 "application " | Freq: Once | CUTANEOUS | Status: DC

## 2019-11-23 MED ORDER — DEXAMETHASONE SODIUM PHOSPHATE 10 MG/ML IJ SOLN
INTRAMUSCULAR | Status: DC | PRN
  Administered 2019-11-23: 5 mg via INTRAVENOUS

## 2019-11-23 MED ORDER — PHENOL 1.4 % MT LIQD: 1.0000 | OROMUCOSAL | Status: DC | PRN

## 2019-11-23 MED ORDER — BUPIVACAINE IN DEXTROSE 0.75-8.25 % IT SOLN
INTRATHECAL | Status: DC | PRN
  Administered 2019-11-23: 1.6 mL via INTRATHECAL

## 2019-11-23 MED ORDER — ISOPROPYL ALCOHOL 70 % SOLN
Status: DC | PRN
  Administered 2019-11-23: 1 via TOPICAL

## 2019-11-23 MED ORDER — MENTHOL 3 MG MT LOZG: 1.0000 | LOZENGE | OROMUCOSAL | Status: DC | PRN

## 2019-11-23 MED ORDER — PROPOFOL 10 MG/ML IV BOLUS
INTRAVENOUS | Status: AC
  Filled 2019-11-23: qty 20

## 2019-11-23 MED ORDER — TRANEXAMIC ACID-NACL 1000-0.7 MG/100ML-% IV SOLN
INTRAVENOUS | Status: AC
  Filled 2019-11-23: qty 100

## 2019-11-23 MED ORDER — BUPIVACAINE HCL (PF) 0.25 % IJ SOLN
INTRAMUSCULAR | Status: DC | PRN
  Administered 2019-11-23: 30 mL

## 2019-11-23 MED ORDER — FENTANYL CITRATE (PF) 100 MCG/2ML IJ SOLN
INTRAMUSCULAR | Status: DC | PRN
Start: 2019-11-23 — End: 2019-11-23
  Administered 2019-11-23 (×2): 50 ug via INTRAVENOUS

## 2019-11-23 MED ORDER — ENOXAPARIN SODIUM 30 MG/0.3ML ~~LOC~~ SOLN
30.0000 mg | SUBCUTANEOUS | Status: DC
  Administered 2019-11-24 – 2019-11-28 (×5): 30 mg via SUBCUTANEOUS
  Filled 2019-11-23 (×5): qty 0.3

## 2019-11-23 MED ORDER — CHLORHEXIDINE GLUCONATE 4 % EX LIQD: 60.0000 mL | Freq: Once | CUTANEOUS | Status: DC

## 2019-11-23 MED ORDER — PHENYLEPHRINE HCL-NACL 10-0.9 MG/250ML-% IV SOLN
INTRAVENOUS | Status: DC | PRN
  Administered 2019-11-23: 40 ug/min via INTRAVENOUS

## 2019-11-23 MED ORDER — METOCLOPRAMIDE HCL 5 MG PO TABS: 5.0000 mg | ORAL_TABLET | Freq: Three times a day (TID) | ORAL | Status: DC | PRN

## 2019-11-23 MED ORDER — LEVOCETIRIZINE DIHYDROCHLORIDE 5 MG PO TABS: 5.0000 mg | ORAL_TABLET | Freq: Every evening | ORAL | Status: DC

## 2019-11-23 MED ORDER — LIDOCAINE HCL (CARDIAC) PF 100 MG/5ML IV SOSY
PREFILLED_SYRINGE | INTRAVENOUS | Status: DC | PRN
  Administered 2019-11-23: 40 mg via INTRAVENOUS

## 2019-11-23 MED ORDER — SENNOSIDES-DOCUSATE SODIUM 8.6-50 MG PO TABS: 1.0000 | ORAL_TABLET | Freq: Two times a day (BID) | ORAL | Status: DC

## 2019-11-23 MED ORDER — PROPOFOL 1000 MG/100ML IV EMUL
INTRAVENOUS | Status: AC
  Filled 2019-11-23: qty 100

## 2019-11-23 MED ORDER — VITAMIN B-12 1000 MCG PO TABS
1000.0000 ug | ORAL_TABLET | Freq: Every day | ORAL | Status: DC
  Administered 2019-11-23 – 2019-11-28 (×4): 1000 ug via ORAL
  Filled 2019-11-23 (×5): qty 1

## 2019-11-23 MED ORDER — LACTATED RINGERS IV SOLN: Freq: Once | INTRAVENOUS | Status: AC

## 2019-11-23 MED ORDER — OXYCODONE HCL 5 MG/5ML PO SOLN: 5.0000 mg | Freq: Once | ORAL | Status: DC | PRN

## 2019-11-23 MED ORDER — ONDANSETRON HCL 4 MG/2ML IJ SOLN
INTRAMUSCULAR | Status: DC | PRN
  Administered 2019-11-23: 4 mg via INTRAVENOUS

## 2019-11-23 MED ORDER — LACTATED RINGERS IV SOLN: INTRAVENOUS | Status: DC | PRN

## 2019-11-23 MED ORDER — CEFAZOLIN SODIUM-DEXTROSE 2-4 GM/100ML-% IV SOLN
2.0000 g | INTRAVENOUS | Status: DC
  Administered 2019-11-23: 2 g via INTRAVENOUS

## 2019-11-23 MED ORDER — KETOROLAC TROMETHAMINE 30 MG/ML IJ SOLN
INTRAMUSCULAR | Status: DC | PRN
  Administered 2019-11-23: 30 mg via INTRA_ARTICULAR

## 2019-11-23 MED ORDER — LIDOCAINE 2% (20 MG/ML) 5 ML SYRINGE
INTRAMUSCULAR | Status: AC
  Filled 2019-11-23: qty 5

## 2019-11-23 MED ORDER — LORATADINE 10 MG PO TABS
10.0000 mg | ORAL_TABLET | Freq: Every day | ORAL | Status: DC
  Administered 2019-11-26 – 2019-11-27 (×2): 10 mg via ORAL
  Filled 2019-11-23 (×2): qty 1

## 2019-11-23 MED ORDER — BUPIVACAINE HCL (PF) 0.25 % IJ SOLN
INTRAMUSCULAR | Status: AC
  Filled 2019-11-23: qty 30

## 2019-11-23 MED ORDER — METOCLOPRAMIDE HCL 5 MG/ML IJ SOLN: 5.0000 mg | Freq: Three times a day (TID) | INTRAMUSCULAR | Status: DC | PRN

## 2019-11-23 MED ORDER — CEFAZOLIN SODIUM-DEXTROSE 2-4 GM/100ML-% IV SOLN
2.0000 g | Freq: Four times a day (QID) | INTRAVENOUS | Status: AC
  Administered 2019-11-23 – 2019-11-24 (×2): 2 g via INTRAVENOUS
  Filled 2019-11-23 (×2): qty 100

## 2019-11-23 MED ORDER — DEXAMETHASONE SODIUM PHOSPHATE 10 MG/ML IJ SOLN
INTRAMUSCULAR | Status: AC
  Filled 2019-11-23: qty 1

## 2019-11-23 MED ORDER — ACETAMINOPHEN 325 MG PO TABS: 325.0000 mg | ORAL_TABLET | ORAL | Status: DC | PRN

## 2019-11-23 MED ORDER — LATANOPROST 0.005 % OP SOLN
1.0000 [drp] | Freq: Every day | OPHTHALMIC | Status: DC
  Administered 2019-11-23 – 2019-11-27 (×5): 1 [drp] via OPHTHALMIC
  Filled 2019-11-23: qty 2.5

## 2019-11-23 MED ORDER — GLYCOPYRROLATE 0.2 MG/ML IJ SOLN
INTRAMUSCULAR | Status: DC | PRN
  Administered 2019-11-23: .2 mg via INTRAVENOUS

## 2019-11-23 MED ORDER — ONDANSETRON HCL 4 MG/2ML IJ SOLN: 4.0000 mg | Freq: Four times a day (QID) | INTRAMUSCULAR | Status: DC | PRN

## 2019-11-23 MED ORDER — PROPOFOL 500 MG/50ML IV EMUL
INTRAVENOUS | Status: DC | PRN
  Administered 2019-11-23: 75 ug/kg/min via INTRAVENOUS

## 2019-11-23 MED ORDER — DEXTROSE 5 % IV SOLN: 3.0000 g | INTRAVENOUS | Status: DC

## 2019-11-23 MED ORDER — FENTANYL CITRATE (PF) 100 MCG/2ML IJ SOLN
INTRAMUSCULAR | Status: AC
  Filled 2019-11-23: qty 2

## 2019-11-23 MED ORDER — CEFAZOLIN SODIUM-DEXTROSE 2-4 GM/100ML-% IV SOLN
INTRAVENOUS | Status: AC
  Filled 2019-11-23: qty 100

## 2019-11-23 MED ORDER — TRANEXAMIC ACID-NACL 1000-0.7 MG/100ML-% IV SOLN
1000.0000 mg | INTRAVENOUS | Status: DC
  Administered 2019-11-23: 1000 mg via INTRAVENOUS

## 2019-11-23 MED ORDER — PROPOFOL 10 MG/ML IV BOLUS
INTRAVENOUS | Status: DC | PRN
  Administered 2019-11-23: 20 mg via INTRAVENOUS
  Administered 2019-11-23: 10 mg via INTRAVENOUS
  Administered 2019-11-23: 20 mg via INTRAVENOUS

## 2019-11-23 MED ORDER — DOCUSATE SODIUM 100 MG PO CAPS
100.0000 mg | ORAL_CAPSULE | Freq: Two times a day (BID) | ORAL | Status: DC
  Administered 2019-11-23 – 2019-11-28 (×9): 100 mg via ORAL
  Filled 2019-11-23 (×9): qty 1

## 2019-11-23 MED ORDER — CHLORHEXIDINE GLUCONATE CLOTH 2 % EX PADS
6.0000 | MEDICATED_PAD | Freq: Every day | CUTANEOUS | Status: DC
  Administered 2019-11-23 – 2019-11-28 (×6): 6 via TOPICAL

## 2019-11-23 MED ORDER — SODIUM CHLORIDE (PF) 0.9 % IJ SOLN
INTRAMUSCULAR | Status: DC | PRN
  Administered 2019-11-23: 30 mL

## 2019-11-23 MED ORDER — ACETAMINOPHEN 160 MG/5ML PO SOLN: 325.0000 mg | ORAL | Status: DC | PRN

## 2019-11-23 MED ORDER — SODIUM CHLORIDE (PF) 0.9 % IJ SOLN
INTRAMUSCULAR | Status: AC
  Filled 2019-11-23: qty 50

## 2019-11-23 MED ORDER — POVIDONE-IODINE 10 % EX SWAB
2.0000 "application " | Freq: Once | CUTANEOUS | Status: DC
  Administered 2019-11-23: 2 via TOPICAL

## 2019-11-23 MED ORDER — TIZANIDINE HCL 4 MG PO TABS
2.0000 mg | ORAL_TABLET | Freq: Every day | ORAL | Status: DC | PRN
  Administered 2019-11-27: 2 mg via ORAL
  Filled 2019-11-23: qty 1

## 2019-11-23 MED ORDER — CHLORHEXIDINE GLUCONATE 0.12 % MT SOLN
15.0000 mL | OROMUCOSAL | Status: DC
  Administered 2019-11-23: 15 mL via OROMUCOSAL

## 2019-11-23 MED ORDER — FENTANYL CITRATE (PF) 100 MCG/2ML IJ SOLN: 25.0000 ug | INTRAMUSCULAR | Status: DC | PRN

## 2019-11-23 MED ORDER — OXYCODONE HCL 5 MG PO TABS: 5.0000 mg | ORAL_TABLET | Freq: Once | ORAL | Status: DC | PRN

## 2019-11-23 MED ORDER — ONDANSETRON HCL 4 MG/2ML IJ SOLN
INTRAMUSCULAR | Status: AC
  Filled 2019-11-23: qty 2

## 2019-11-23 MED ORDER — ONDANSETRON HCL 4 MG PO TABS: 4.0000 mg | ORAL_TABLET | Freq: Four times a day (QID) | ORAL | Status: DC | PRN

## 2019-11-23 MED ORDER — HYDROXYZINE HCL 50 MG/ML IM SOLN
25.0000 mg | Freq: Once | INTRAMUSCULAR | Status: AC
  Administered 2019-11-24: 25 mg via INTRAMUSCULAR
  Filled 2019-11-23: qty 0.5

## 2019-11-23 MED ORDER — GLYCOPYRROLATE PF 0.2 MG/ML IJ SOSY
PREFILLED_SYRINGE | INTRAMUSCULAR | Status: AC
  Filled 2019-11-23: qty 1

## 2019-11-23 SURGICAL SUPPLY — 64 items
ADH SKN CLS APL DERMABOND .7 (GAUZE/BANDAGES/DRESSINGS) ×1
APL PRP STRL LF DISP 70% ISPRP (MISCELLANEOUS) ×1
ARTICULEZE HEAD (Hips) ×2 IMPLANT
BAG DECANTER FOR FLEXI CONT (MISCELLANEOUS) IMPLANT
BAG SPEC THK2 15X12 ZIP CLS (MISCELLANEOUS)
BAG ZIPLOCK 12X15 (MISCELLANEOUS) IMPLANT
BLADE SURG SZ10 CARB STEEL (BLADE) IMPLANT
CHLORAPREP W/TINT 26 (MISCELLANEOUS) ×2 IMPLANT
COVER PERINEAL POST (MISCELLANEOUS) ×2 IMPLANT
COVER SURGICAL LIGHT HANDLE (MISCELLANEOUS) ×2 IMPLANT
COVER WAND RF STERILE (DRAPES) IMPLANT
CUP SECTOR GRIPTON 58MM (Orthopedic Implant) ×1 IMPLANT
DECANTER SPIKE VIAL GLASS SM (MISCELLANEOUS) ×2 IMPLANT
DERMABOND ADVANCED (GAUZE/BANDAGES/DRESSINGS) ×1
DERMABOND ADVANCED .7 DNX12 (GAUZE/BANDAGES/DRESSINGS) ×2 IMPLANT
DRAPE IMP U-DRAPE 54X76 (DRAPES) ×2 IMPLANT
DRAPE SHEET LG 3/4 BI-LAMINATE (DRAPES) ×6 IMPLANT
DRAPE STERI IOBAN 125X83 (DRAPES) ×1 IMPLANT
DRAPE U-SHAPE 47X51 STRL (DRAPES) ×4 IMPLANT
DRSG AQUACEL AG ADV 3.5X10 (GAUZE/BANDAGES/DRESSINGS) ×2 IMPLANT
ELECT REM PT RETURN 15FT ADLT (MISCELLANEOUS) ×2 IMPLANT
GAUZE SPONGE 4X4 12PLY STRL (GAUZE/BANDAGES/DRESSINGS) ×2 IMPLANT
GLOVE BIO SURGEON STRL SZ8.5 (GLOVE) ×4 IMPLANT
GLOVE BIOGEL M STRL SZ7.5 (GLOVE) ×4 IMPLANT
GLOVE BIOGEL PI IND STRL 8 (GLOVE) ×1 IMPLANT
GLOVE BIOGEL PI IND STRL 8.5 (GLOVE) ×1 IMPLANT
GLOVE BIOGEL PI INDICATOR 8 (GLOVE) ×1
GLOVE BIOGEL PI INDICATOR 8.5 (GLOVE) ×1
GOWN SPEC L3 XXLG W/TWL (GOWN DISPOSABLE) ×2 IMPLANT
GOWN STRL REUS W/ TWL LRG LVL3 (GOWN DISPOSABLE) ×1 IMPLANT
GOWN STRL REUS W/TWL LRG LVL3 (GOWN DISPOSABLE) ×2
HANDPIECE INTERPULSE COAX TIP (DISPOSABLE) ×2
HEAD ARTICULEZE (Hips) ×1 IMPLANT
HOLDER FOLEY CATH W/STRAP (MISCELLANEOUS) ×2 IMPLANT
HOOD PEEL AWAY FLYTE STAYCOOL (MISCELLANEOUS) ×8 IMPLANT
JET LAVAGE IRRISEPT WOUND (IRRIGATION / IRRIGATOR) ×2
KIT TURNOVER KIT A (KITS) IMPLANT
LAVAGE JET IRRISEPT WOUND (IRRIGATION / IRRIGATOR) ×1 IMPLANT
LINER NEUTRAL 52X36X58N (Liner) ×1 IMPLANT
MANIFOLD NEPTUNE II (INSTRUMENTS) ×2 IMPLANT
MARKER SKIN DUAL TIP RULER LAB (MISCELLANEOUS) ×2 IMPLANT
NDL SAFETY ECLIPSE 18X1.5 (NEEDLE) ×1 IMPLANT
NEEDLE HYPO 18GX1.5 SHARP (NEEDLE) ×2
NEEDLE SPNL 18GX3.5 QUINCKE PK (NEEDLE) ×2 IMPLANT
PACK ANTERIOR HIP CUSTOM (KITS) ×2 IMPLANT
PENCIL SMOKE EVACUATOR (MISCELLANEOUS) IMPLANT
SAW OSC TIP CART 19.5X105X1.3 (SAW) ×2 IMPLANT
SEALER BIPOLAR AQUA 6.0 (INSTRUMENTS) ×2 IMPLANT
SET HNDPC FAN SPRY TIP SCT (DISPOSABLE) ×1 IMPLANT
STEM TRI LOC BPS SZ7 W GRIPTON (Hips) IMPLANT
SUT ETHIBOND NAB CT1 #1 30IN (SUTURE) ×4 IMPLANT
SUT MNCRL AB 3-0 PS2 18 (SUTURE) ×2 IMPLANT
SUT MNCRL AB 4-0 PS2 18 (SUTURE) ×2 IMPLANT
SUT MON AB 2-0 CT1 36 (SUTURE) ×4 IMPLANT
SUT STRATAFIX PDO 1 14 VIOLET (SUTURE) ×2
SUT STRATFX PDO 1 14 VIOLET (SUTURE) ×1
SUT VIC AB 2-0 CT1 27 (SUTURE) ×2
SUT VIC AB 2-0 CT1 TAPERPNT 27 (SUTURE) ×1 IMPLANT
SUTURE STRATFX PDO 1 14 VIOLET (SUTURE) ×1 IMPLANT
SYR 3ML LL SCALE MARK (SYRINGE) ×2 IMPLANT
TRAY FOLEY MTR SLVR 16FR STAT (SET/KITS/TRAYS/PACK) IMPLANT
TRI LOC BPS SZ 7 W GRIPTON (Hips) ×2 IMPLANT
TUBE SUCTION HIGH CAP CLEAR NV (SUCTIONS) ×2 IMPLANT
WATER STERILE IRR 1000ML POUR (IV SOLUTION) ×2 IMPLANT

## 2019-11-23 NOTE — Anesthesia Procedure Notes (Signed)
Spinal  Patient location during procedure: OR Start time: 11/23/2019 4:35 PM End time: 11/23/2019 4:45 PM Preanesthetic Checklist Completed: patient identified, IV checked, site marked, risks and benefits discussed, surgical consent, monitors and equipment checked, pre-op evaluation and timeout performed Spinal Block Patient position: right lateral decubitus Prep: DuraPrep Patient monitoring: heart rate, cardiac monitor, continuous pulse ox and blood pressure Approach: midline Location: L4-5 Injection technique: single-shot Needle Needle type: Sprotte  Needle gauge: 24 G Needle length: 9 cm Assessment Sensory level: T6

## 2019-11-23 NOTE — Discharge Instructions (Signed)
°Dr. Jennika Ringgold °Joint Replacement Specialist °[facility] °[address] °[place], [place] [address] °[phone] ° ° °TOTAL HIP REPLACEMENT POSTOPERATIVE DIRECTIONS ° ° ° °Hip Rehabilitation, Guidelines Following Surgery  ° °WEIGHT BEARING °Weight bearing as tolerated with assist device (Wassink, cane, etc) as directed, use it as long as suggested by your surgeon or therapist, typically at least 4-6 weeks. ° °The results of a hip operation are greatly improved after range of motion and muscle strengthening exercises. Follow all safety measures which are given to protect your hip. If any of these exercises cause increased pain or swelling in your joint, decrease the amount until you are comfortable again. Then slowly increase the exercises. Call your caregiver if you have problems or questions.  ° °HOME CARE INSTRUCTIONS  °Most of the following instructions are designed to prevent the dislocation of your new hip.  °Remove items at home which could result in a fall. This includes throw rugs or furniture in walking pathways.  °Continue medications as instructed at time of discharge. °· You may have some home medications which will be placed on hold until you complete the course of blood thinner medication. °· You may start showering once you are discharged home. Do not remove your dressing. °Do not put on socks or shoes without following the instructions of your caregivers.   °Sit on chairs with arms. Use the chair arms to help push yourself up when arising.  °Arrange for the use of a toilet seat elevator so you are not sitting low.  °· Walk with [place] as instructed.  °You may resume a sexual relationship in one month or when given the OK by your caregiver.  °Use Sherrin as long as suggested by your caregivers.  °You may put full weight on your legs and walk as much as is comfortable. °Avoid periods of inactivity such as sitting longer than an hour when not asleep. This helps prevent  blood clots.  °You may return to work once you are cleared by your surgeon.  °Do not drive a car for 6 weeks or until released by your surgeon.  °Do not drive while taking narcotics.  °Wear elastic stockings for two weeks following surgery during the day but you may remove then at night.  °Make sure you keep all of your appointments after your operation with all of your doctors and caregivers. You should call the office at the above phone number and make an appointment for approximately two weeks after the date of your surgery. °Please pick up a stool softener and laxative for home use as long as you are requiring pain medications. °· ICE to the affected hip every three hours for 30 minutes at a time and then as needed for pain and swelling. Continue to use ice on the hip for pain and swelling from surgery. You may notice swelling that will progress down to the foot and ankle.  This is normal after surgery.  Elevate the leg when you are not up walking on it.   °It is important for you to complete the blood thinner medication as prescribed by your doctor. °· Continue to use the breathing machine which will help keep your temperature down.  It is common for your temperature to cycle up and down following surgery, especially at night when you are not up moving around and exerting yourself.  The breathing machine keeps your lungs expanded and your temperature down. ° °RANGE OF MOTION AND STRENGTHENING EXERCISES  °These exercises are   designed to help you keep full movement of your hip joint. Follow your caregiver's or physical therapist's instructions. Perform all exercises about fifteen times, three times per day or as directed. Exercise both hips, even if you have had only one joint replacement. These exercises can be done on a training (exercise) mat, on the floor, on a table or on a bed. Use whatever works the best and is most comfortable for you. Use music or television while you are exercising so that the exercises  are a pleasant break in your day. This will make your life better with the exercises acting as a break in routine you can look forward to.  °Lying on your back, slowly slide your foot toward your buttocks, raising your knee up off the floor. Then slowly slide your foot back down until your leg is straight again.  °Lying on your back spread your legs as far apart as you can without causing discomfort.  °Lying on your side, raise your upper leg and foot straight up from the floor as far as is comfortable. Slowly lower the leg and repeat.  °Lying on your back, tighten up the muscle in the front of your thigh (quadriceps muscles). You can do this by keeping your leg straight and trying to raise your heel off the floor. This helps strengthen the largest muscle supporting your knee.  °Lying on your back, tighten up the muscles of your buttocks both with the legs straight and with the knee bent at a comfortable angle while keeping your heel on the floor.  ° °SKILLED REHAB INSTRUCTIONS: °If the patient is transferred to a skilled rehab facility following release from the hospital, a list of the current medications will be sent to the facility for the patient to continue.  When discharged from the skilled rehab facility, please have the facility set up the patient's Home Health Physical Therapy prior to being released. Also, the skilled facility will be responsible for providing the patient with their medications at time of release from the facility to include their pain medication and their blood thinner medication. If the patient is still at the rehab facility at time of the two week follow up appointment, the skilled rehab facility will also need to assist the patient in arranging follow up appointment in our office and any transportation needs. ° °MAKE SURE YOU:  °Understand these instructions.  °Will watch your condition.  °Will get help right away if you are not doing well or get worse. ° °Pick up stool softner and  laxative for home use following surgery while on pain medications. °Do not remove your dressing. °The dressing is waterproof--it is OK to take showers. °Continue to use ice for pain and swelling after surgery. °Do not use any lotions or creams on the incision until instructed by your surgeon. °Total Hip Protocol. ° ° °

## 2019-11-23 NOTE — Progress Notes (Signed)
PROGRESS NOTE    Jesus Harmon  NTI:[id number] DOB: [birth year] DOA: 11/22/2019 PCP: Alroy Dust, L.Marlou Sa, MD    Chief Complaint  Patient presents with  . Fall  . Hip Pain    Brief Narrative:  HPI per Dr. Neysa Bonito This is an 86 year old male with a history of CAD s/p stent, HFpEF, vascular dementia, Hypertension, hyperlipidemia who was sent in from [facility] office for left hip fracture. Patient had a fall onto his left hip several days ago resulting in pain most notably with ambulation. [place] due to poor balance on uneven ground outside. He has fallen multiple times over the past several weeks for similar reasons without LOC and had multiple falls this week. Most recent fall was last night when he was sitting in his chair and leaning over to pick something up off the ground when he lost his balance and struck the back of his head on the wall. No LOC, dizziness, SOB, numbness/weakness or other potential causes to his falls. Struck his right cheek on the ground and suffered a small bruise several weeks ago. His falls prompted him to go to the orthopedic urgent care today with his daughter where he was found to have a left hip fracture by Xray and was sent to [facility] by ortho. Denies any chest pain, palpitations, nausea, vomiting or any other complaints.   ED Course: afebrile, hemodynamically stable on room air. Labs overall unremarkable. CT head with chronic ischemic white matter disease and other chronic issues but no acute findings. CT Cervical spine with severe multilevel DDD and 11x6 mm irregular nodule seen in RUL medially with pleural tail concerning for possible neoplasm.   Assessment & Plan:   Principal Problem:   Hip fracture ([place]) Active Problems:   CAD, NATIVE VESSEL  (06/16/2016): a. sev sten of mid right coronary art PTCA and DES , severe prox LAD stenosis unsucc PTCA, widely patent left main and left circ   Hyperlipidemia   Essential hypertension   CAD (coronary artery disease)   B12  deficiency   Anemia   Pulmonary nodule, right  1 left femoral neck fracture status post multiple falls Secondary to mechanical fall.  Patient seen in outpatient orthopedic clinic and sent to the ED.  Patient assessed by orthopedics who are recommending operative treatment with left total hip arthroplasty to be done by Dr. Lyla Glassing today.  Aspirin on hold.  Patient seen in consultation by cardiology for preop clearance and per cardiology to proceed with surgery at acceptable risk and to resume aspirin postop when able to.  Pain management per orthopedics.  Per orthopedics.  2.  Hypertension Continue lisinopril.  3.  Coronary artery disease status post DES in 2018 Status post cardiac cath 06/16/2016: Severe stenosis of mid RCA status post PTCA and DES, severe proximal LAD stenosis with unsuccessful angioplasty.  Continue to hold aspirin and resume postop when cleared by orthopedics.  Continue lisinopril, statin.  Patient on was seen for preop clearance by cardiology.  Will need outpatient follow-up with cardiology.  4.  Hyperlipidemia Continue statin.  5.  Right Pulmonary nodule,  concerning for neoplasm Noted on CT C-spine.  Check a CT chest without contrast.  Outpatient follow-up with pulmonary.  6.  Vitamin B12 deficiency Dose oral vitamin B12 tablets.  7.  Anemia Likely secondary to vitamin B12 deficiency.  Check an anemia panel.  Follow H&H.  Transfusion threshold hemoglobin <7.    DVT prophylaxis: Awaiting surgery.  Postop DVT prophylaxis per orthopedics. Code Status:  DNR Family Communication: Updated patient.  No family at bedside. Disposition:   Status is: Inpatient    Dispo: The patient is from: Home              Anticipated d/c is to: To be determined              Anticipated d/c date is: 2 to 3 days              Patient currently awaiting hip fracture repair.  Not stable for discharge.       Consultants:   Orthopedics: Dr. Wynelle Link 11/22/2019  Cardiology: Dr.  Harrell Gave 11/22/2019  Procedures:   CT head CT C-spine 11/22/2019  Antimicrobials:   None   Subjective: Sitting up in gurney in ED.  Denies any chest pain or shortness of breath.  No abdominal pain.  Objective: Vitals:   11/23/19 0715 11/23/19 0730 11/23/19 0745 11/23/19 0800  BP:  (!) 129/57  (!) 120/56  Pulse: 89 66 68 64  Resp: 17 18 19 16   Temp:      TempSrc:      SpO2: 100% 99% 97% 97%    Intake/Output Summary (Last 24 hours) at 11/23/2019 0916 Last data filed at 11/22/2019 1558 Gross per 24 hour  Intake --  Output 600 ml  Net -600 ml   There were no vitals filed for this visit.  Examination:  General exam: Appears calm and comfortable  Respiratory system: Clear to auscultation. Respiratory effort normal. Cardiovascular system: S1 & S2 heard, RRR. No JVD, murmurs, rubs, gallops or clicks. No pedal edema. Gastrointestinal system: Abdomen is nondistended, soft and nontender. No organomegaly or masses felt. Normal bowel sounds heard. Central nervous system: Alert and oriented. No focal neurological deficits. Extremities: Symmetric 5 x 5 power. Skin: No rashes, lesions or ulcers Psychiatry: Judgement and insight appear normal. Mood & affect appropriate.     Data Reviewed: I have personally reviewed following labs and imaging studies  CBC: Recent Labs  Lab 11/22/19 1212 11/23/19 0819  WBC 8.4 7.9  NEUTROABS 6.2 6.3  HGB 9.6* 9.3*  HCT 31.4* 29.9*  MCV 93.5 93.4  PLT 357 478    Basic Metabolic Panel: Recent Labs  Lab 11/22/19 1212 11/23/19 0819  NA 139 138  K 3.9 4.1  CL 103 104  CO2 26 24  GLUCOSE 108* 102*  BUN 19 19  CREATININE 1.02 0.84  CALCIUM 9.1 8.9  MG  --  2.0    GFR: CrCl cannot be calculated (Unknown ideal weight.).  Liver Function Tests: No results for input(s): AST, ALT, ALKPHOS, BILITOT, PROT, ALBUMIN in the last 168 hours.  CBG: No results for input(s): GLUCAP in the last 168 hours.   Recent Results (from the  past 240 hour(s))  Respiratory Panel by RT PCR (Flu A&B, Covid) - Nasopharyngeal Swab     Status: None   Collection Time: 11/22/19 12:13 PM   Specimen: Nasopharyngeal Swab  Result Value Ref Range Status   SARS Coronavirus 2 by RT PCR NEGATIVE NEGATIVE Final    Comment: (NOTE) SARS-CoV-2 target nucleic acids are NOT DETECTED.  The SARS-CoV-2 RNA is generally detectable in upper respiratoy specimens during the acute phase of infection. The lowest concentration of SARS-CoV-2 viral copies this assay can detect is 131 copies/mL. A negative result does not preclude SARS-Cov-2 infection and should not be used as the sole basis for treatment or other patient management decisions. A negative result may occur with  improper specimen collection/handling, submission  of specimen other than nasopharyngeal swab, presence of viral mutation(s) within the areas targeted by this assay, and inadequate number of viral copies (<131 copies/mL). A negative result must be combined with clinical observations, patient history, and epidemiological information. The expected result is Negative.  Fact Sheet for Patients:  PinkCheek.be  Fact Sheet for Healthcare Providers:  GravelBags.it  This test is no t yet approved or cleared by the [place] FDA and  has been authorized for detection and/or diagnosis of SARS-CoV-2 by FDA under an Emergency Use Authorization (EUA). This EUA will remain  in effect (meaning this test can be used) for the duration of the COVID-19 declaration under Section 564(b)(1) of the Act, 21 U.S.C. section 360bbb-3(b)(1), unless the authorization is terminated or revoked sooner.     Influenza A by PCR NEGATIVE NEGATIVE Final   Influenza B by PCR NEGATIVE NEGATIVE Final    Comment: (NOTE) The Xpert Xpress SARS-CoV-2/FLU/RSV assay is intended as an aid in  the diagnosis of influenza from Nasopharyngeal swab specimens and   should not be used as a sole basis for treatment. Nasal washings and  aspirates are unacceptable for Xpert Xpress SARS-CoV-2/FLU/RSV  testing.  Fact Sheet for Patients: PinkCheek.be  Fact Sheet for Healthcare Providers: GravelBags.it  This test is not yet approved or cleared by the [place] FDA and  has been authorized for detection and/or diagnosis of SARS-CoV-2 by  FDA under an Emergency Use Authorization (EUA). This EUA will remain  in effect (meaning this test can be used) for the duration of the  Covid-19 declaration under Section 564(b)(1) of the Act, 21  U.S.C. section 360bbb-3(b)(1), unless the authorization is  terminated or revoked. Performed at [facility], [place] [address], [place], [place] [address]          Radiology Studies: CT Head Wo Contrast  Result Date: 11/22/2019 CLINICAL DATA:  Head injury and neck pain after fall. EXAM: CT HEAD WITHOUT CONTRAST CT CERVICAL SPINE WITHOUT CONTRAST TECHNIQUE: Multidetector CT imaging of the head and cervical spine was performed following the standard protocol without intravenous contrast. Multiplanar CT image reconstructions of the cervical spine were also generated. COMPARISON:  September 05, 2013. FINDINGS: CT HEAD FINDINGS Brain: Mild chronic ischemic white matter disease is noted. Mild diffuse cortical atrophy is noted. No mass effect or midline shift is noted. Ventricular size is within normal limits. There is no evidence of mass lesion, hemorrhage or acute infarction. Vascular: No hyperdense vessel or unexpected calcification. Skull: Normal. Negative for fracture or focal lesion. Sinuses/Orbits: No acute finding. Other: None. CT CERVICAL SPINE FINDINGS Alignment: Minimal grade 1 retrolisthesis of C5-6 is noted secondary to severe degenerative disc disease at this level. Skull base and vertebrae: No acute fracture. No primary bone lesion or focal  pathologic process. Soft tissues and spinal canal: No prevertebral fluid or swelling. No visible canal hematoma. Disc levels: Severe degenerative disc disease is noted at C4-5, C5-6, C6-7 and C7-T1. Upper chest: 11 x 6 mm irregular nodule is seen medially in right upper lobe with pleural tail concerning for possible neoplasm. Other: None. IMPRESSION: 1. Mild chronic ischemic white matter disease. Mild diffuse cortical atrophy. No acute intracranial abnormality seen. 2. Severe multilevel degenerative disc disease. No acute abnormality seen in the cervical spine. 3. 11 x 6 mm irregular nodule is seen medially in right upper lobe with pleural tail concerning for possible neoplasm. CT scan of the chest is recommended for further evaluation. Electronically Signed   By: Jeneen Rinks  Murlean Caller M.D.   On: 11/22/2019 13:22   CT Cervical Spine Wo Contrast  Result Date: 11/22/2019 CLINICAL DATA:  Head injury and neck pain after fall. EXAM: CT HEAD WITHOUT CONTRAST CT CERVICAL SPINE WITHOUT CONTRAST TECHNIQUE: Multidetector CT imaging of the head and cervical spine was performed following the standard protocol without intravenous contrast. Multiplanar CT image reconstructions of the cervical spine were also generated. COMPARISON:  September 05, 2013. FINDINGS: CT HEAD FINDINGS Brain: Mild chronic ischemic white matter disease is noted. Mild diffuse cortical atrophy is noted. No mass effect or midline shift is noted. Ventricular size is within normal limits. There is no evidence of mass lesion, hemorrhage or acute infarction. Vascular: No hyperdense vessel or unexpected calcification. Skull: Normal. Negative for fracture or focal lesion. Sinuses/Orbits: No acute finding. Other: None. CT CERVICAL SPINE FINDINGS Alignment: Minimal grade 1 retrolisthesis of C5-6 is noted secondary to severe degenerative disc disease at this level. Skull base and vertebrae: No acute fracture. No primary bone lesion or focal pathologic process. Soft  tissues and spinal canal: No prevertebral fluid or swelling. No visible canal hematoma. Disc levels: Severe degenerative disc disease is noted at C4-5, C5-6, C6-7 and C7-T1. Upper chest: 11 x 6 mm irregular nodule is seen medially in right upper lobe with pleural tail concerning for possible neoplasm. Other: None. IMPRESSION: 1. Mild chronic ischemic white matter disease. Mild diffuse cortical atrophy. No acute intracranial abnormality seen. 2. Severe multilevel degenerative disc disease. No acute abnormality seen in the cervical spine. 3. 11 x 6 mm irregular nodule is seen medially in right upper lobe with pleural tail concerning for possible neoplasm. CT scan of the chest is recommended for further evaluation. Electronically Signed   By: Marijo Conception M.D.   On: 11/22/2019 13:22        Scheduled Meds: . latanoprost  1 drop Both Eyes QHS  . levocetirizine  5 mg Oral QPM  . lisinopril  10 mg Oral Daily  . simvastatin  20 mg Oral Daily  . vitamin B-12  1,000 mcg Oral Daily   Continuous Infusions:   LOS: 1 day    Time spent: 35 minutes    Irine Seal, MD [facility]   To contact the attending provider between 7A-7P or the covering provider during after hours 7P-7A, please log into the web site [email or web address] and access using universal [place] password for that web site. If you do not have the password, please call the hospital operator.  11/23/2019, 9:16 AM

## 2019-11-23 NOTE — Transfer of Care (Signed)
Immediate Anesthesia Transfer of Care Note  Patient: Jesus Harmon  Procedure(s) Performed: TOTAL HIP ARTHROPLASTY ANTERIOR APPROACH (Left Hip)  Patient Location: PACU  Anesthesia Type:Spinal  Level of Consciousness: awake  Airway & Oxygen Therapy: Patient Spontanous Breathing and Patient connected to face mask oxygen  Post-op Assessment: Report given to RN and Post -op Vital signs reviewed and stable  Post vital signs: Reviewed and stable  Last Vitals:  Vitals Value Taken Time  BP 127/61 11/23/19 1857  Temp 36.5 C 11/23/19 1857  Pulse 57 11/23/19 1900  Resp 13 11/23/19 1900  SpO2 100 % 11/23/19 1900  Vitals shown include unvalidated device data.  Last Pain:  Vitals:   11/23/19 1525  TempSrc: Oral  PainSc:          Complications: No complications documented.

## 2019-11-23 NOTE — ED Notes (Signed)
DNR stickers placed on pt armband

## 2019-11-23 NOTE — ED Notes (Signed)
Pt given CHG bath

## 2019-11-23 NOTE — Anesthesia Preprocedure Evaluation (Signed)
Anesthesia Evaluation  Patient identified by MRN, date of birth, ID band Patient awake    Reviewed: Allergy & Precautions, NPO status , Patient's Chart, lab work & pertinent test results  History of Anesthesia Complications Negative for: history of anesthetic complications  Airway Mallampati: III  TM Distance: >3 FB Neck ROM: Full    Dental  (+) Dental Advisory Given   Pulmonary shortness of breath, former smoker,    breath sounds clear to auscultation       Cardiovascular hypertension, (-) angina+ CAD and + Cardiac Stents  (-) dysrhythmias  Rhythm:Regular  1. The left ventricle has normal systolic function, with an ejection  fraction of 55-60%. The cavity size was normal. Left ventricular diastolic  Doppler parameters are consistent with pseudonormalization No evidence of  left ventricular regional wall  motion abnormalities.  2. The right ventricle has normal systolic function. The cavity was  normal. There is no increase in right ventricular wall thickness.  3. The mitral valve is normal in structure. No evidence of mitral valve  stenosis. Trivial regurgitation.  4. The tricuspid valve is normal in structure.  5. The aortic valve is tricuspid Mild calcification of the aortic valve.  no stenosis of the aortic valve.  6. The pulmonic valve was normal in structure.  7. The inferior vena cava was dilated in size with >50% respiratory  variability.  8. Right atrial pressure is estimated at 8 mmHg.  9. PA systolic pressure 34 mmHg.      Nuclear stress EF: 60%. No wall motion abnormalities noted.  There was no ST segment deviation noted during stress.  Defect 1: There is a small defect of moderate severity present in the basal inferoseptal location.  The left ventricular ejection fraction is normal (55-65%).  This is a low risk study There is no ischemia in the LAD distribution or other territories.   1. Severe  stenosis of the mid right coronary artery treated successfully with PTCA and stenting using a 4.0 x 16 mm Synergy DES 2. Severe proximal LAD stenosis confirmed by FFR analysis (0.72) but unsuccessful angioplasty because of inability to expand a balloon in this rigid lesion 3. Widely patent left main and left circumflex 4. Normal LV function by noninvasive assessment  Recommendation: The patient will be observed overnight. Will continue dual antiplatelet therapy with aspirin and clopidogrel for 12 months. Consider atherectomy and PCI of the LAD lesion in a few weeks after reassessment of the patient in the outpatient setting.    Neuro/Psych  Headaches, PSYCHIATRIC DISORDERS Dementia    GI/Hepatic negative GI ROS, Neg liver ROS,   Endo/Other  negative endocrine ROS  Renal/GU negative Renal ROSLab Results      Component                Value               Date                      CREATININE               0.84                11/23/2019           Lab Results      Component                Value               Date  K                        4.1                 11/23/2019                Musculoskeletal  (+) Arthritis , LEFT FEMORAL NECK FRACTURE   Abdominal   Peds  Hematology  (+) Blood dyscrasia, anemia , Lab Results      Component                Value               Date                      WBC                      7.9                 11/23/2019                HGB                      9.3 (L)             11/23/2019                HCT                      29.9 (L)            11/23/2019                MCV                      93.4                11/23/2019                PLT                      357                 11/23/2019              Anesthesia Other Findings   Reproductive/Obstetrics                            Anesthesia Physical Anesthesia Plan  ASA: III  Anesthesia Plan: MAC and Spinal   Post-op Pain Management:     Induction:   PONV Risk Score and Plan: 1 and Propofol infusion and Treatment may vary due to age or medical condition  Airway Management Planned: Nasal Cannula  Additional Equipment: None  Intra-op Plan:   Post-operative Plan:   Informed Consent: I have reviewed the patients History and Physical, chart, labs and discussed the procedure including the risks, benefits and alternatives for the proposed anesthesia with the patient or authorized representative who has indicated his/her understanding and acceptance.     Dental advisory given and Consent reviewed with POA  Plan Discussed with: CRNA and Surgeon  Anesthesia Plan Comments: (Discussed spinal anesthetic with daughter Manon Hilding by telephone. )        Anesthesia Quick Evaluation

## 2019-11-23 NOTE — Op Note (Signed)
OPERATIVE REPORT  SURGEON: Rod Can, MD   ASSISTANT: Cherlynn June, PA-C  PREOPERATIVE DIAGNOSIS: Displaced Left femoral neck fracture.   POSTOPERATIVE DIAGNOSIS: Displaced Left femoral neck fracture.   PROCEDURE: Left total hip arthroplasty, anterior approach.   IMPLANTS: DePuy Tri Lock stem, size 7, hi offset. DePuy Pinnacle Cup, size 58 mm. DePuy Altrx liner, size 36 by 58 mm, neutral. DePuy metal head ball, size 36 + 5 mm.  ANESTHESIA:  Spinal  ANTIBIOTICS: 2g ancef.  ESTIMATED BLOOD LOSS:-250 mL    DRAINS: None.  COMPLICATIONS: None   CONDITION: PACU - hemodynamically stable.   BRIEF CLINICAL NOTE: Jesus Harmon is a 86 y.o. male with a displaced Left femoral neck fracture. The patient was admitted to the hospitalist service and underwent perioperative risk stratification and medical optimization. The risks, benefits, and alternatives to total hip arthroplasty were explained, and the patient elected to proceed.  PROCEDURE IN DETAIL: The patient was taken to the operating room and general anesthesia was induced on the hospital bed.  The patient was then positioned on the Hana table.  All bony prominences were well padded.  The hip was prepped and draped in the normal sterile surgical fashion.  A time-out was called verifying side and site of surgery. Antibiotics were given within 60 minutes of beginning the procedure.  The direct anterior approach to the hip was performed through the Hueter interval.  Lateral femoral circumflex vessels were treated with the Auqumantys. The anterior capsule was exposed and an inverted T capsulotomy was made.  Fracture hematoma was encountered and evacuated. The patient was found to have a comminuted Left subcapital femoral neck fracture.  I freshened the femoral neck cut with a saw.  I removed the femoral neck fragment.  A corkscrew was placed into the head and the head was removed.  This was passed to the back table and was  measured.   Acetabular exposure was achieved, and the pulvinar and labrum were excised. Sequential reaming of the acetabulum was then performed up to a size 57 mm reamer. A 58 mm cup was then opened and impacted into place at approximately 40 degrees of abduction and 20 degrees of anteversion. The final polyethylene liner was impacted into place.    I then gained femoral exposure taking care to protect the abductors and greater trochanter.  This was performed using standard external rotation, extension, and adduction.  The capsule was peeled off the inner aspect of the greater trochanter, taking care to preserve the short external rotators. A cookie cutter was used to enter the femoral canal, and then the femoral canal finder was used to confirm location.  I then sequentially broached up to a size 7.  Calcar planer was used on the femoral neck remnant.  I paced a hi neck and a trial head ball. The hip was reduced.  Leg lengths were checked fluoroscopically.  The hip was dislocated and trial components were removed.  I placed the real stem followed by the real spacer and head ball.  The hip was reduced.  Fluoroscopy was used to confirm component position and leg lengths.  At 90 degrees of external rotation and extension, the hip was stable to an anterior directed force.   The wound was copiously irrigated with Irrisept solution and normal saline using pule lavage.  Marcaine solution was injected into the periarticular soft tissue.  The wound was closed in layers using #1 Vicryl and V-Loc for the fascia, 2-0 Vicryl for the subcutaneous fat, 2-0  Monocryl for the deep dermal layer, 3-0 running Monocryl subcuticular stitch and glue for the skin.  Once the glue was fully dried, an Aquacell Ag dressing was applied.  The patient was then awakened from anesthesia and transported to the recovery room in stable condition.  Sponge, needle, and instrument counts were correct at the end of the case x2.  The patient  tolerated the procedure well and there were no known complications.  Please note that a surgical assistant was a medical necessity for this procedure to perform it in a safe and expeditious manner. Assistant was necessary to provide appropriate retraction of vital neurovascular structures, to prevent femoral fracture, and to allow for anatomic placement of the prosthesis.

## 2019-11-24 DIAGNOSIS — F0151 Vascular dementia with behavioral disturbance: Secondary | ICD-10-CM

## 2019-11-24 DIAGNOSIS — F03918 Unspecified dementia, unspecified severity, with other behavioral disturbance: Secondary | ICD-10-CM | POA: Diagnosis present

## 2019-11-24 DIAGNOSIS — F0391 Unspecified dementia with behavioral disturbance: Secondary | ICD-10-CM | POA: Diagnosis present

## 2019-11-24 DIAGNOSIS — E538 Deficiency of other specified B group vitamins: Secondary | ICD-10-CM | POA: Diagnosis not present

## 2019-11-24 DIAGNOSIS — I2510 Atherosclerotic heart disease of native coronary artery without angina pectoris: Secondary | ICD-10-CM | POA: Diagnosis not present

## 2019-11-24 DIAGNOSIS — S72002A Fracture of unspecified part of neck of left femur, initial encounter for closed fracture: Secondary | ICD-10-CM

## 2019-11-24 DIAGNOSIS — D649 Anemia, unspecified: Secondary | ICD-10-CM | POA: Diagnosis not present

## 2019-11-24 LAB — URINALYSIS, ROUTINE W REFLEX MICROSCOPIC
Bilirubin Urine: NEGATIVE
Glucose, UA: NEGATIVE mg/dL
Ketones, ur: NEGATIVE mg/dL
Nitrite: NEGATIVE
Protein, ur: NEGATIVE mg/dL
RBC / HPF: >50 RBC/hpf — ABNORMAL HIGH (ref 0–5)
Specific Gravity, Urine: 1.016 (ref 1.005–1.030)
pH: 5 (ref 5.0–8.0)

## 2019-11-24 LAB — CBC WITH DIFFERENTIAL/PLATELET
Abs Immature Granulocytes: 0.06 10*3/uL (ref 0.00–0.07)
Basophils Absolute: 0 10*3/uL (ref 0.0–0.1)
Basophils Relative: 0 %
Eosinophils Absolute: 0 10*3/uL (ref 0.0–0.5)
Eosinophils Relative: 0 %
HCT: 30.2 % — ABNORMAL LOW (ref 39.0–52.0)
Hemoglobin: 9.3 g/dL — ABNORMAL LOW (ref 13.0–17.0)
Immature Granulocytes: 1 %
Lymphocytes Relative: 6 %
Lymphs Abs: 0.8 10*3/uL (ref 0.7–4.0)
MCH: 28.7 pg (ref 26.0–34.0)
MCHC: 30.8 g/dL (ref 30.0–36.0)
MCV: 93.2 fL (ref 80.0–100.0)
Monocytes Absolute: 1.1 10*3/uL — ABNORMAL HIGH (ref 0.1–1.0)
Monocytes Relative: 9 %
Neutro Abs: 10.7 10*3/uL — ABNORMAL HIGH (ref 1.7–7.7)
Neutrophils Relative %: 84 %
Platelets: 358 10*3/uL (ref 150–400)
RBC: 3.24 MIL/uL — ABNORMAL LOW (ref 4.22–5.81)
RDW: 13 % (ref 11.5–15.5)
WBC: 12.6 10*3/uL — ABNORMAL HIGH (ref 4.0–10.5)
nRBC: 0 % (ref 0.0–0.2)

## 2019-11-24 LAB — BASIC METABOLIC PANEL
Anion gap: 10 (ref 5–15)
BUN: 25 mg/dL — ABNORMAL HIGH (ref 8–23)
CO2: 24 mmol/L (ref 22–32)
Calcium: 9 mg/dL (ref 8.9–10.3)
Chloride: 102 mmol/L (ref 98–111)
Creatinine, Ser: 1.12 mg/dL (ref 0.61–1.24)
GFR, Estimated: >60 mL/min (ref >60)
Glucose, Bld: 113 mg/dL — ABNORMAL HIGH (ref 70–99)
Potassium: 4.2 mmol/L (ref 3.5–5.1)
Sodium: 136 mmol/L (ref 135–145)

## 2019-11-24 MED ORDER — SODIUM CHLORIDE 0.9 % IV SOLN: INTRAVENOUS | Status: DC

## 2019-11-24 MED ORDER — LORAZEPAM 2 MG/ML IJ SOLN
1.0000 mg | Freq: Four times a day (QID) | INTRAMUSCULAR | Status: DC | PRN
  Administered 2019-11-24 – 2019-11-25 (×2): 1 mg via INTRAVENOUS
  Filled 2019-11-24 (×2): qty 1

## 2019-11-24 MED ORDER — OLANZAPINE 5 MG PO TBDP
5.0000 mg | ORAL_TABLET | Freq: Every day | ORAL | Status: DC
  Administered 2019-11-24: 5 mg via ORAL
  Filled 2019-11-24: qty 1

## 2019-11-24 MED ORDER — ASPIRIN EC 81 MG PO TBEC
81.0000 mg | DELAYED_RELEASE_TABLET | Freq: Every day | ORAL | Status: DC
  Administered 2019-11-24 – 2019-11-28 (×4): 81 mg via ORAL
  Filled 2019-11-24 (×4): qty 1

## 2019-11-24 MED ORDER — OLANZAPINE 5 MG PO TBDP
5.0000 mg | ORAL_TABLET | Freq: Two times a day (BID) | ORAL | Status: DC
  Administered 2019-11-24 – 2019-11-28 (×7): 5 mg via ORAL
  Filled 2019-11-24 (×8): qty 1

## 2019-11-24 MED ORDER — ENSURE ENLIVE PO LIQD
237.0000 mL | Freq: Two times a day (BID) | ORAL | Status: DC
  Administered 2019-11-26 – 2019-11-27 (×3): 237 mL via ORAL

## 2019-11-24 MED ORDER — JUVEN PO PACK
1.0000 | PACK | Freq: Two times a day (BID) | ORAL | Status: DC
  Administered 2019-11-24 – 2019-11-27 (×4): 1 via ORAL
  Filled 2019-11-24 (×10): qty 1

## 2019-11-24 NOTE — Plan of Care (Signed)

## 2019-11-24 NOTE — Progress Notes (Signed)
Inpatient Rehab Admissions Coordinator Note:   Per therapy recommendations, pt was screened for CIR candidacy by Clemens Catholic, [place] CCC-SLP. At this time, Pt. Appears to have functional decline and is a potential candidate for CIR. Will place order for rehab consult per protocol.  Pt.'s dementia may be limiting factor for CIR appropriateness. I will plan to meet with Pt. To get a better sense of his ability to participate and follow directions.  Please contact me with questions.   Clemens Catholic, [place], [place] Admissions Coordinator  [phone] ([place]) [phone] (office)

## 2019-11-24 NOTE — Progress Notes (Signed)
Jesus Harmon  MRN: [id number] DOB/Age: 1929-12-08 86 y.o. Physician: Ander Slade, M.D. 1 Day Post-Op Procedure(s) (LRB): TOTAL HIP ARTHROPLASTY ANTERIOR APPROACH (Left)  Subjective: Patient extremely agitated this a.m.  Daughter is at bedside and has been managing his behavior through the night.  Sleepless night.  Patient is argumentative.  Continually trying to get out of bed.  Threatening tone in his conversation. Vital Signs Temp:  [97.7 F (36.5 C)-98.3 F (36.8 C)] 97.8 F (36.6 C) (11/13 0448) Pulse Rate:  [53-89] 86 (11/13 0448) Resp:  [10-23] 19 (11/13 0448) BP: (115-152)/(50-104) 141/70 (11/13 0448) SpO2:  [93 %-100 %] 93 % (11/13 0448) Weight:  [61.2 kg] 61.2 kg (11/12 1534)  Lab Results Recent Labs    11/23/19 0819 11/24/19 0554  WBC 7.9 12.6*  HGB 9.3* 9.3*  HCT 29.9* 30.2*  PLT 357 358   BMET Recent Labs    11/23/19 0819 11/24/19 0554  NA 138 136  K 4.1 4.2  CL 104 102  CO2 24 24  GLUCOSE 102* 113*  BUN 19 25*  CREATININE 0.84 1.12  CALCIUM 8.9 9.0   INR  Date Value Ref Range Status  06/14/2016 0.9 0.8 - 1.2 Final    Comment:    Reference interval is for non-anticoagulated patients. Suggested INR therapeutic range for Vitamin K antagonist therapy:    Standard Dose (moderate intensity                   therapeutic range):       2.0 - 3.0    Higher intensity therapeutic range       2.5 - 3.5      Exam  Patient will not allow examination of his hip.  Daughter was able to lift the hospital down to allow inspection of the dressing which is clean and dry.  He is uncooperative with request to move ankle and knee although does appear grossly neurovascular intact with grossly equal leg lengths.  Impression  1.  Status post left total hip arthroplasty for the treatment of a displaced left femoral neck fracture.  2.  Acutely altered mental status with extreme anxiety  Plan I have spoken with the patient's daughter regarding today's findings.  We  have discussed that often there can be acute mental status changes perioperatively in the frail elderly.  I have spoken with the nursing staff and the hospitalist service have provided some initial interventions.  Patient would benefit from a Air cabin crew.  Family members will be at the bedside during the interim.  Otherwise continue postop total hip rehab protocol. Arbor Leer M Katana Berthold 11/24/2019, 9:05 AM   Contact # [phone]

## 2019-11-24 NOTE — Progress Notes (Signed)
PROGRESS NOTE    Jesus Harmon  LKG:[id number] DOB: 07/25/1929 DOA: 11/22/2019 PCP: Alroy Dust, L.Marlou Sa, MD    Chief Complaint  Patient presents with  . Fall  . Hip Pain    Brief Narrative:  HPI per Dr. Neysa Bonito This is an 86 year old male with a history of CAD s/p stent, HFpEF, vascular dementia, Hypertension, hyperlipidemia who was sent in from [facility] office for left hip fracture. Patient had a fall onto his left hip several days ago resulting in pain most notably with ambulation. [place] due to poor balance on uneven ground outside. He has fallen multiple times over the past several weeks for similar reasons without LOC and had multiple falls this week. Most recent fall was last night when he was sitting in his chair and leaning over to pick something up off the ground when he lost his balance and struck the back of his head on the wall. No LOC, dizziness, SOB, numbness/weakness or other potential causes to his falls. Struck his right cheek on the ground and suffered a small bruise several weeks ago. His falls prompted him to go to the orthopedic urgent care today with his daughter where he was found to have a left hip fracture by Xray and was sent to [facility] by ortho. Denies any chest pain, palpitations, nausea, vomiting or any other complaints.   ED Course: afebrile, hemodynamically stable on room air. Labs overall unremarkable. CT head with chronic ischemic white matter disease and other chronic issues but no acute findings. CT Cervical spine with severe multilevel DDD and 11x6 mm irregular nodule seen in RUL medially with pleural tail concerning for possible neoplasm.   Assessment & Plan:   Principal Problem:   Hip fracture ([place]) Active Problems:   CAD, NATIVE VESSEL  (06/16/2016): a. sev sten of mid right coronary art PTCA and DES , severe prox LAD stenosis unsucc PTCA, widely patent left main and left circ   Hyperlipidemia   Essential hypertension   CAD (coronary artery disease)   B12  deficiency   Anemia   Pulmonary nodule, right   Dementia with behavioral disturbance (HCC)  1 left femoral neck fracture status post multiple falls Secondary to mechanical fall.  Patient seen in outpatient orthopedic clinic and sent to the ED.  Patient assessed by orthopedics and patient status post left total hip arthroplasty per Dr. Lyla Glassing 11/23/2019 without any complications.  Aspirin was held however to be resumed by orthopedics today.  Patient currently agitated.  Pain management per orthopedics.  PT/OT.  Per orthopedics.   2.  Hypertension Continue lisinopril.  3.  Coronary artery disease status post DES in 2018 Status post cardiac cath 06/16/2016: Severe stenosis of mid RCA status post PTCA and DES, severe proximal LAD stenosis with unsuccessful angioplasty.  Aspirin held and likely to be resumed today by orthopedics.  Continue lisinopril and statin.  Was seen by cardiology for preop clearance on admission.  Will need outpatient follow-up with cardiology.   4.  Hyperlipidemia Continue statin.  5.  Right Pulmonary nodule,  concerning for neoplasm Noted on CT C-spine.  CT chest with areas of parenchymal lung scarring and atelectasis may be due to inflammation, surveillance is warranted.  1.1 x 1.1 x 0.9 cm right middle lobe nodule opacity noted potentially worrisome for small neoplastic focus.  Will need repeat CT chest or follow-up PET/CT or tissue sampling in 3 months.  Outpatient follow-up with pulmonary.  6.  Vitamin B12 deficiency Continue oral vitamin B12 tablets.  7.  Anemia of chronic disease Likely secondary to vitamin B12 deficiency.  Hemoglobin currently at 9.3.  Anemia panel consistent with anemia of chronic disease.  Vitamin B12 levels at 794.  Transfusion threshold hemoglobin <7.  8.  Dementia with behavioral disturbance Patient with a history of underlying vascular dementia.  Patient more aggressive and agitated overnight and this morning.  Daughter at bedside.   Patient's daughter states patient is more agitated and aggressive than his baseline.  Patient impulsive.  Patient tried to get out of bed.  Per daughter patient threw his food today.  Check a UA with cultures and sensitivities to rule out infectious etiology for agitation.  Place on Zyprexa 5 mg p.o. twice daily.  Supportive care.  Follow.   DVT prophylaxis: Awaiting surgery.  Postop DVT prophylaxis per orthopedics. Code Status: DNR Family Communication: Updated patient and daughter, [name] at bedside. Disposition:   Status is: Inpatient    Dispo: The patient is from: Home              Anticipated d/c is to: To be determined              Anticipated d/c date is: 2 to 3 days              Patient status post hip fracture repair.  PT OT pending.  Patient agitated.  Not stable for discharge.         Consultants:   Orthopedics: Dr. Wynelle Link 11/22/2019  Cardiology: Dr. Harrell Gave 11/22/2019  Procedures:   CT head CT C-spine 11/22/2019   Left total hip arthroplasty per Dr. Lyla Glassing 11/23/2019  Antimicrobials:   None   Subjective: Patient agitated, aggressive last night and this morning.  Patient confused.  Somewhat tangential.  Denies any chest pain or shortness of breath.  Daughter at bedside.  Objective: Vitals:   11/23/19 2041 11/24/19 0100 11/24/19 0448 11/24/19 0906  BP: (!) 152/75 (!) 148/69 (!) 141/70 (!) 155/65  Pulse: 75 89 86 95  Resp: 17  19 16   Temp: 97.8 F (36.6 C) 97.9 F (36.6 C) 97.8 F (36.6 C) 98.5 F (36.9 C)  TempSrc: Oral Oral Oral Oral  SpO2: 96% 96% 93% 97%  Weight:      Height:        Intake/Output Summary (Last 24 hours) at 11/24/2019 0918 Last data filed at 11/24/2019 0900 Gross per 24 hour  Intake 1240 ml  Output 1500 ml  Net -260 ml   Filed Weights   11/23/19 1534  Weight: 61.2 kg    Examination:  General exam: Less agitated.  Confused. Respiratory system: CTA B anterior lung fields.  No wheezes, no crackles, no rhonchi.   Normal respiratory effort.   Cardiovascular system: Regular rate rhythm no murmurs rubs or gallops.  No JVD.  No lower extremity edema.   Gastrointestinal system: Abdomen soft, nontender, nondistended, positive bowel sounds.  No rebound.  No guarding.   Central nervous system: Alert and oriented. No focal neurological deficits. Extremities: Symmetric 5 x 5 power. Skin: No rashes, lesions or ulcers Psychiatry: Judgement and insight poor l. Mood & affect appropriate.     Data Reviewed: I have personally reviewed following labs and imaging studies  CBC: Recent Labs  Lab 11/22/19 1212 11/23/19 0819 11/24/19 0554  WBC 8.4 7.9 12.6*  NEUTROABS 6.2 6.3 10.7*  HGB 9.6* 9.3* 9.3*  HCT 31.4* 29.9* 30.2*  MCV 93.5 93.4 93.2  PLT 357 357 242    Basic Metabolic Panel: Recent  Labs  Lab 11/22/19 1212 11/23/19 0819 11/24/19 0554  NA 139 138 136  K 3.9 4.1 4.2  CL 103 104 102  CO2 26 24 24   GLUCOSE 108* 102* 113*  BUN 19 19 25*  CREATININE 1.02 0.84 1.12  CALCIUM 9.1 8.9 9.0  MG  --  2.0  --     GFR: Estimated Creatinine Clearance: 39.5 mL/min (by C-G formula based on SCr of 1.12 mg/dL).  Liver Function Tests: No results for input(s): AST, ALT, ALKPHOS, BILITOT, PROT, ALBUMIN in the last 168 hours.  CBG: No results for input(s): GLUCAP in the last 168 hours.   Recent Results (from the past 240 hour(s))  Respiratory Panel by RT PCR (Flu A&B, Covid) - Nasopharyngeal Swab     Status: None   Collection Time: 11/22/19 12:13 PM   Specimen: Nasopharyngeal Swab  Result Value Ref Range Status   SARS Coronavirus 2 by RT PCR NEGATIVE NEGATIVE Final    Comment: (NOTE) SARS-CoV-2 target nucleic acids are NOT DETECTED.  The SARS-CoV-2 RNA is generally detectable in upper respiratoy specimens during the acute phase of infection. The lowest concentration of SARS-CoV-2 viral copies this assay can detect is 131 copies/mL. A negative result does not preclude SARS-Cov-2 infection and  should not be used as the sole basis for treatment or other patient management decisions. A negative result may occur with  improper specimen collection/handling, submission of specimen other than nasopharyngeal swab, presence of viral mutation(s) within the areas targeted by this assay, and inadequate number of viral copies (<131 copies/mL). A negative result must be combined with clinical observations, patient history, and epidemiological information. The expected result is Negative.  Fact Sheet for Patients:  PinkCheek.be  Fact Sheet for Healthcare Providers:  GravelBags.it  This test is no t yet approved or cleared by the [place] FDA and  has been authorized for detection and/or diagnosis of SARS-CoV-2 by FDA under an Emergency Use Authorization (EUA). This EUA will remain  in effect (meaning this test can be used) for the duration of the COVID-19 declaration under Section 564(b)(1) of the Act, 21 U.S.C. section 360bbb-3(b)(1), unless the authorization is terminated or revoked sooner.     Influenza A by PCR NEGATIVE NEGATIVE Final   Influenza B by PCR NEGATIVE NEGATIVE Final    Comment: (NOTE) The Xpert Xpress SARS-CoV-2/FLU/RSV assay is intended as an aid in  the diagnosis of influenza from Nasopharyngeal swab specimens and  should not be used as a sole basis for treatment. Nasal washings and  aspirates are unacceptable for Xpert Xpress SARS-CoV-2/FLU/RSV  testing.  Fact Sheet for Patients: PinkCheek.be  Fact Sheet for Healthcare Providers: GravelBags.it  This test is not yet approved or cleared by the [place] FDA and  has been authorized for detection and/or diagnosis of SARS-CoV-2 by  FDA under an Emergency Use Authorization (EUA). This EUA will remain  in effect (meaning this test can be used) for the duration of the  Covid-19 declaration  under Section 564(b)(1) of the Act, 21  U.S.C. section 360bbb-3(b)(1), unless the authorization is  terminated or revoked. Performed at [facility], [place] [address], [place], [place] [address]          Radiology Studies: CT Head Wo Contrast  Result Date: 11/22/2019 CLINICAL DATA:  Head injury and neck pain after fall. EXAM: CT HEAD WITHOUT CONTRAST CT CERVICAL SPINE WITHOUT CONTRAST TECHNIQUE: Multidetector CT imaging of the head and cervical spine was performed following the standard protocol without  intravenous contrast. Multiplanar CT image reconstructions of the cervical spine were also generated. COMPARISON:  September 05, 2013. FINDINGS: CT HEAD FINDINGS Brain: Mild chronic ischemic white matter disease is noted. Mild diffuse cortical atrophy is noted. No mass effect or midline shift is noted. Ventricular size is within normal limits. There is no evidence of mass lesion, hemorrhage or acute infarction. Vascular: No hyperdense vessel or unexpected calcification. Skull: Normal. Negative for fracture or focal lesion. Sinuses/Orbits: No acute finding. Other: None. CT CERVICAL SPINE FINDINGS Alignment: Minimal grade 1 retrolisthesis of C5-6 is noted secondary to severe degenerative disc disease at this level. Skull base and vertebrae: No acute fracture. No primary bone lesion or focal pathologic process. Soft tissues and spinal canal: No prevertebral fluid or swelling. No visible canal hematoma. Disc levels: Severe degenerative disc disease is noted at C4-5, C5-6, C6-7 and C7-T1. Upper chest: 11 x 6 mm irregular nodule is seen medially in right upper lobe with pleural tail concerning for possible neoplasm. Other: None. IMPRESSION: 1. Mild chronic ischemic white matter disease. Mild diffuse cortical atrophy. No acute intracranial abnormality seen. 2. Severe multilevel degenerative disc disease. No acute abnormality seen in the cervical spine. 3. 11 x 6 mm irregular nodule is seen  medially in right upper lobe with pleural tail concerning for possible neoplasm. CT scan of the chest is recommended for further evaluation. Electronically Signed   By: Marijo Conception M.D.   On: 11/22/2019 13:22   CT CHEST WO CONTRAST  Result Date: 11/23/2019 CLINICAL DATA:  Nodular opacity seen right upper lobe on recent cervical spine CT examination EXAM: CT CHEST WITHOUT CONTRAST TECHNIQUE: Multidetector CT imaging of the chest was performed following the standard protocol without IV contrast. COMPARISON:  Cervical spine CT November 22, 2019. Chest radiograph June 24, 2018 FINDINGS: Cardiovascular: There is no appreciable thoracic aortic aneurysm. There is mild calcification at the origin of the left subclavian artery. Other visualized great vessels appear unremarkable on this noncontrast enhanced study. There are foci of aortic atherosclerosis. There are multiple foci of coronary artery calcification. There is no pericardial effusion or pericardial thickening. Mediastinum/Nodes: The thyroid is not enlarged. There are nodular opacities in the left lobe, none of which measure larger than 1 cm in size. No appreciable thoracic adenopathy noted. Several subcentimeter lymph nodes noted. No esophageal lesions are evident. Lungs/Pleura: There is localized scarring and atelectasis in the right upper lobe anteriorly and medially at the site noted on recent CT. There is a nodular appearing opacity within this area measuring 8 x 6 mm, best seen on axial slice 36 series 5. There is a bullous area with scarring in the posterior segment of the left upper lobe measuring 3.1 x 2.0 cm. There is an area of scarring with focal volume loss more inferiorly in the anterior segment of the left upper lobe which is felt to primarily be due to inflammatory change. There is an area that appears somewhat nodular within this area of scarring measuring 0.9 x 0.8 cm, best seen on axial slice 57 series 5. There is a focal nodular opacity  in the right middle lobe lateral segment measuring 1.1 x 1.1 x 0.9 cm which is well defined. There is atelectatic change in the medial left base. No pleural effusions. There is mild lower lobe bronchiectatic change. No edema or consolidation evident. Upper Abdomen: There is upper abdominal aortic atherosclerosis. Visualized upper abdominal structures otherwise appear normal. Musculoskeletal: There is degenerative change in the thoracic spine. Degenerative changes noted  in the left shoulder. No blastic or lytic bone lesions are evident. No chest wall lesions are appreciable. IMPRESSION: 1. Areas of parenchymal lung scarring and atelectasis. Somewhat nodular areas noted within areas of apparent scarring and atelectasis of uncertain etiology. These areas may entirely be due to inflammation; surveillance of these areas is warranted given the overall appearance, however. 2. There is a well-defined nodular opacity in the right middle lobe lateral segment measuring 1.1 x 1.1 x 0.9 cm which is potentially more worrisome for a small neoplastic focus. Consider one of the following in 3 months for both low-risk and high-risk individuals: (a) repeat chest CT, (b) follow-up PET-CT, or (c) tissue sampling. This recommendation follows the consensus statement: Guidelines for Management of Incidental Pulmonary Nodules Detected on CT Images: From the Fleischner Society 2017; Radiology 2017; 284:228-243. Given multiple nodular appearing areas, nuclear medicine PET study at this time would be a reasonable consideration. 3. No edema or airspace opacity. There is mild lower lobe bronchiectatic change bilaterally. 4.  No evident adenopathy. 5. Nodular opacities in the left lobe of the thyroid without enlargement. In the setting of significant comorbidities or limited life expectancy, no follow-up recommended beyond particular attention to this area on repeat CT imaging. (Ref: J Am Coll Radiol. 2015 Feb;12(2): 143-50). 6. Aortic  atherosclerosis. Foci of coronary artery calcification noted. Aortic Atherosclerosis (ICD10-I70.0). Electronically Signed   By: Lowella Grip III M.D.   On: 11/23/2019 11:05   CT Cervical Spine Wo Contrast  Result Date: 11/22/2019 CLINICAL DATA:  Head injury and neck pain after fall. EXAM: CT HEAD WITHOUT CONTRAST CT CERVICAL SPINE WITHOUT CONTRAST TECHNIQUE: Multidetector CT imaging of the head and cervical spine was performed following the standard protocol without intravenous contrast. Multiplanar CT image reconstructions of the cervical spine were also generated. COMPARISON:  September 05, 2013. FINDINGS: CT HEAD FINDINGS Brain: Mild chronic ischemic white matter disease is noted. Mild diffuse cortical atrophy is noted. No mass effect or midline shift is noted. Ventricular size is within normal limits. There is no evidence of mass lesion, hemorrhage or acute infarction. Vascular: No hyperdense vessel or unexpected calcification. Skull: Normal. Negative for fracture or focal lesion. Sinuses/Orbits: No acute finding. Other: None. CT CERVICAL SPINE FINDINGS Alignment: Minimal grade 1 retrolisthesis of C5-6 is noted secondary to severe degenerative disc disease at this level. Skull base and vertebrae: No acute fracture. No primary bone lesion or focal pathologic process. Soft tissues and spinal canal: No prevertebral fluid or swelling. No visible canal hematoma. Disc levels: Severe degenerative disc disease is noted at C4-5, C5-6, C6-7 and C7-T1. Upper chest: 11 x 6 mm irregular nodule is seen medially in right upper lobe with pleural tail concerning for possible neoplasm. Other: None. IMPRESSION: 1. Mild chronic ischemic white matter disease. Mild diffuse cortical atrophy. No acute intracranial abnormality seen. 2. Severe multilevel degenerative disc disease. No acute abnormality seen in the cervical spine. 3. 11 x 6 mm irregular nodule is seen medially in right upper lobe with pleural tail concerning for  possible neoplasm. CT scan of the chest is recommended for further evaluation. Electronically Signed   By: Marijo Conception M.D.   On: 11/22/2019 13:22   Pelvis Portable  Result Date: 11/23/2019 CLINICAL DATA:  Postop left hip replacement EXAM: PORTABLE PELVIS 1-2 VIEWS COMPARISON:  None. FINDINGS: Mild to moderate arthritis of the right hip. Pubic symphysis and rami appear intact. Status post left hip replacement with intact hardware and normal alignment. No fracture. Gas in the  soft tissues consistent with recent surgery IMPRESSION: Status post left hip replacement with expected postsurgical changes. Electronically Signed   By: Donavan Foil M.D.   On: 11/23/2019 20:24   DG C-Arm 1-60 Min-No Report  Result Date: 11/23/2019 Fluoroscopy was utilized by the requesting physician.  No radiographic interpretation.   DG HIP OPERATIVE UNILAT W OR W/O PELVIS LEFT  Result Date: 11/23/2019 CLINICAL DATA:  Left hip arthroplasty EXAM: OPERATIVE left HIP (WITH PELVIS IF PERFORMED) 2 VIEWS TECHNIQUE: Fluoroscopic spot image(s) were submitted for interpretation post-operatively. COMPARISON:  None FINDINGS: Two low resolution intraoperative spot views of the left hip. Total fluoroscopy time was 8 seconds. The images demonstrate a left hip replacement with normal alignment IMPRESSION: Intraoperative fluoroscopic assistance provided during left hip replacement. Electronically Signed   By: Donavan Foil M.D.   On: 11/23/2019 20:23        Scheduled Meds: . aspirin EC  81 mg Oral Daily  . Chlorhexidine Gluconate Cloth  6 each Topical Daily  . docusate sodium  100 mg Oral BID  . enoxaparin (LOVENOX) injection  30 mg Subcutaneous Q24H  . feeding supplement  237 mL Oral BID BM  . latanoprost  1 drop Both Eyes QHS  . lisinopril  10 mg Oral Daily  . loratadine  10 mg Oral q1800  . nutrition supplement (JUVEN)  1 packet Oral BID BM  . OLANZapine zydis  5 mg Oral Daily  . simvastatin  20 mg Oral Daily  .  vitamin B-12  1,000 mcg Oral Daily   Continuous Infusions: . sodium chloride       LOS: 2 days    Time spent: 35 minutes    Irine Seal, MD [facility]   To contact the attending provider between 7A-7P or the covering provider during after hours 7P-7A, please log into the web site [email or web address] and access using universal [place] password for that web site. If you do not have the password, please call the hospital operator.  11/24/2019, 9:18 AM

## 2019-11-24 NOTE — Progress Notes (Signed)
Initial Nutrition Assessment  DOCUMENTATION CODES:   Not applicable  INTERVENTION:  Ensure Enlive po BID, each supplement provides 350 kcal and 20 grams of protein (vanilla or strawberry)  Juven BID, each packet provides 95 calories, 2.5 grams of protein (collagen), and 9.8 grams of carbohydrate (3 grams sugar); also contains 7 grams of L-arginine and L-glutamine, 300 mg vitamin C, 15 mg vitamin E, 1.2 mcg vitamin B-12, 9.5 mg zinc, 200 mg calcium, and 1.5 g  Calcium Beta-hydroxy-Beta-methylbutyrate to support wound healing   NUTRITION DIAGNOSIS:   Increased nutrient needs related to post-op healing (hip fracture) as evidenced by estimated needs.    GOAL:   Patient will meet greater than or equal to 90% of their needs    MONITOR:   PO intake, Weight trends, Supplement acceptance, Labs, I & O's, Skin  REASON FOR ASSESSMENT:   Consult Assessment of nutrition requirement/status (hip fx)  ASSESSMENT:  RD working remotely.  86 year old male with history of CAD s/p stent, HFpEF, vascular dementia, HTN,  HLD, and recent history of multiple falls  presented from [facility] office for left hip fracture.  Patient is s/p left total hip arthroplasty on 11/12   RD spoke with daughter via phone, reports pt is a little confused this morning. Nutrition history obtained from daughter. No po intake since diet advanced to regular last night at 2047. Daughter has ordered all meals for today. Will continue to monitor meal intakes. He has a good appetite at baseline and eats well at home, denied any chewing/swallowing difficulties. She reports significant weight loss over the past couple of years, but weights now stable. Per chart, weights have trended down ~3 lbs (2.4%) in the last month; insignificant. Educated on increased needs to support post-op healing. Pt does not drink supplements at home, unsure if he would like them but willing to try vanilla or strawberry flavors. Will order twice  daily and monitor for acceptance. Will also order Juven to support post-op wound healing.  Medications reviewed and include: Colace, B12  Labs: BUN 25 (H), WBC 12.6 (H)   NUTRITION - FOCUSED PHYSICAL EXAM: Unable to complete at this time, RD working remotely.  Diet Order:   Diet Order            Diet regular Room service appropriate? Yes; Fluid consistency: Thin  Diet effective now                 EDUCATION NEEDS:   No education needs have been identified at this time  Skin:  Skin Assessment: Skin Integrity Issues: Skin Integrity Issues:: Incisions Incisions: closed; left hip  Last BM:  pta  Height:   Ht Readings from Last 1 Encounters:  11/23/19 5\' 11"  (1.803 m)    Weight:   Wt Readings from Last 1 Encounters:  11/23/19 61.2 kg    Ideal Body Weight:  78.2 kg  BMI:  Body mass index is 18.83 kg/m.  Estimated Nutritional Needs:   Kcal:  1715-1960  Protein:  92-98  Fluid:  >1.5 L/day   Lajuan Lines, RD, LDN Clinical Nutrition After Hours/Weekend Pager # in [place]

## 2019-11-24 NOTE — Evaluation (Signed)
Physical Therapy Evaluation Patient Details Name: Jesus Harmon MRN: [id number] DOB: 12/15/1929 Today's Date: 11/24/2019   History of Present Illness  Pt is 86 yo male with hx of CAD s/p stent, HF, dementia, HTN, and hyperlipidemia.  Pt had a fall several days ago with L hip pain.  He was seen at [facility] and found to have L hip fx.  Pt is now s/p anterior THA on 11/23/19.  Clinical Impression  Pt admitted with above diagnosis. Pt was seen today on POD #1 and in regards to hip surgery he is progressing well demonstrating good pain control and muscle activation.  However, pt was largely limited due to confusion and inability to follow commands.  He has hx of dementia that is exacerbated at this time.  He required assist of 2 for safety due to confusion but only required min A for physical assist when following commands.  Pt is normally very active and independent with good family support. Pt currently with functional limitations due to the deficits listed below (see PT Problem List). Pt will benefit from skilled PT to increase their independence and safety with mobility to allow discharge to the venue listed below.       Follow Up Recommendations CIR (if not CIR will need SNF)    Equipment Recommendations  Rolling Schappert with 5" wheels;3in1 (PT);Other (comment) (further assessment next venue)    Recommendations for Other Services       Precautions / Restrictions Precautions Precautions: Fall      Mobility  Bed Mobility Overal bed mobility: Needs Assistance Bed Mobility: Supine to Sit;Sit to Supine     Supine to sit: Mod assist Sit to supine: +2 for physical assistance;Max assist   General bed mobility comments: Required increased assistance due to dementia and unable to follow commands.  Mod A to initiate sitting but pt able to complete.  Max x 2 to return to supine as pt not following commands.    Transfers Overall transfer level: Needs assistance Equipment used: 2  person hand held assist;Rolling Stout (2 wheeled) Transfers: Sit to/from Stand Sit to Stand: Min assist;+2 safety/equipment         General transfer comment: Pt not following commands for hand placement and at times using therapist hands and others using RW.  Min A x 2 for safety but very little assist provided to power up  Ambulation/Gait Ambulation/Gait assistance: Min assist;+2 safety/equipment Gait Distance (Feet): 10 Feet Assistive device: Rolling Zehner (2 wheeled);2 person hand held assist;None Gait Pattern/deviations: Step-to pattern;Decreased stride length;Shuffle Gait velocity: decreased   General Gait Details: Very poor safety awareness, not following commands, started with RW but not with hands in correct place, then pt released Sydnor for a couple steps and took steps with HHA. Pt was able to maintain standing and required min A of 2 forr safety due to confusion not necessarily for physical assist  Stairs            Wheelchair Mobility    Modified Rankin (Stroke Patients Only)       Balance Overall balance assessment: Needs assistance   Sitting balance-Leahy Scale: Good     Standing balance support: Bilateral upper extremity supported;Single extremity supported;No upper extremity supported Standing balance-Leahy Scale: Poor Standing balance comment: requiring min Guard for safety; use of extremities at times; limited due to confusion but no overt LOB  Pertinent Vitals/Pain Pain Assessment: Faces Faces Pain Scale: Hurts a little bit Pain Location: Verbalized some pain in L hip with transfers but no signs of significant pain Pain Intervention(s): Limited activity within patient's tolerance;Monitored during session;Repositioned;Relaxation    Home Living Family/patient expects to be discharged to:: Skilled nursing facility Living Arrangements: Spouse/significant other Available Help at Discharge: Family;Available 24  hours/day Type of Home: House Home Access: Stairs to enter Entrance Stairs-Rails: None Entrance Stairs-Number of Steps: 1 Home Layout: One level Home Equipment: Squillace - 2 wheels;Cane - single point      Prior Function Level of Independence: Independent         Comments: Independent except does not drive due to dementia     Hand Dominance        Extremity/Trunk Assessment   Upper Extremity Assessment Upper Extremity Assessment: Overall WFL for tasks assessed;Difficult to assess due to impaired cognition    Lower Extremity Assessment Lower Extremity Assessment: Difficult to assess due to impaired cognition;LLE deficits/detail LLE Deficits / Details: Pt expressing some pain with L leg movement but did demonstrate 3/5 MMT throughout except hip flexion    Cervical / Trunk Assessment Cervical / Trunk Assessment: Normal  Communication   Communication: No difficulties  Cognition Arousal/Alertness: Awake/alert Behavior During Therapy: Restless (agitated at times) Overall Cognitive Status: Impaired/Different from baseline Area of Impairment: Orientation;Problem solving;Attention;Memory;Following commands;Safety/judgement;Awareness                 Orientation Level: Disoriented to;Place;Time (did state he broke his him) Current Attention Level: Focused Memory: Decreased recall of precautions;Decreased short-term memory Following Commands: Follows one step commands inconsistently Safety/Judgement: Decreased awareness of safety;Decreased awareness of deficits Awareness: Intellectual Problem Solving: Difficulty sequencing;Requires verbal cues;Requires tactile cues General Comments: Pt does have hx of dementia but is further confused at this time.  He is fidgeting and pulling at blankets and lines.      General Comments      Exercises     Assessment/Plan    PT Assessment Patient needs continued PT services  PT Problem List Decreased strength;Decreased  mobility;Decreased safety awareness;Decreased range of motion;Decreased coordination;Decreased knowledge of precautions;Decreased activity tolerance;Decreased cognition;Decreased balance;Decreased knowledge of use of DME       PT Treatment Interventions DME instruction;Therapeutic activities;Cognitive remediation;Gait training;Modalities;Therapeutic exercise;Patient/family education;Balance training;Functional mobility training;Stair training    PT Goals (Current goals can be found in the Care Plan section)  Acute Rehab PT Goals Patient Stated Goal: pt unable; family expressed interest in CIR PT Goal Formulation: With patient/family Time For Goal Achievement: 12/08/19 Potential to Achieve Goals: Good    Frequency Min 3X/week   Barriers to discharge        Co-evaluation               AM-PAC PT "6 Clicks" Mobility  Outcome Measure Help needed turning from your back to your side while in a flat bed without using bedrails?: A Lot Help needed moving from lying on your back to sitting on the side of a flat bed without using bedrails?: A Lot Help needed moving to and from a bed to a chair (including a wheelchair)?: A Little Help needed standing up from a chair using your arms (e.g., wheelchair or bedside chair)?: A Little Help needed to walk in hospital room?: A Little Help needed climbing 3-5 steps with a railing? : A Lot 6 Click Score: 15    End of Session Equipment Utilized During Treatment: Gait belt Activity Tolerance: Patient tolerated treatment well Patient left:  in bed;with call bell/phone within reach;with bed alarm set;with family/visitor present Nurse Communication: Mobility status PT Visit Diagnosis: Unsteadiness on feet (R26.81);Other abnormalities of gait and mobility (R26.89);Muscle weakness (generalized) (M62.81);History of falling (Z91.81)    Time: 1130-1207 PT Time Calculation (min) (ACUTE ONLY): 37 min   Charges:   PT Evaluation $PT Eval Moderate  Complexity: 1 Mod PT Treatments $Gait Training: 8-22 mins        Abran Richard, PT Acute Rehab Services Pager [phone] [facility] [place] 11/24/2019, 1:20 PM

## 2019-11-25 ENCOUNTER — Inpatient Hospital Stay (HOSPITAL_COMMUNITY): Payer: Medicare Other

## 2019-11-25 DIAGNOSIS — E538 Deficiency of other specified B group vitamins: Secondary | ICD-10-CM | POA: Diagnosis not present

## 2019-11-25 DIAGNOSIS — I2510 Atherosclerotic heart disease of native coronary artery without angina pectoris: Secondary | ICD-10-CM | POA: Diagnosis not present

## 2019-11-25 DIAGNOSIS — S72002A Fracture of unspecified part of neck of left femur, initial encounter for closed fracture: Secondary | ICD-10-CM | POA: Diagnosis not present

## 2019-11-25 DIAGNOSIS — D649 Anemia, unspecified: Secondary | ICD-10-CM | POA: Diagnosis not present

## 2019-11-25 LAB — BASIC METABOLIC PANEL
Anion gap: 9 (ref 5–15)
BUN: 19 mg/dL (ref 8–23)
CO2: 25 mmol/L (ref 22–32)
Calcium: 8.8 mg/dL — ABNORMAL LOW (ref 8.9–10.3)
Chloride: 106 mmol/L (ref 98–111)
Creatinine, Ser: 0.83 mg/dL (ref 0.61–1.24)
GFR, Estimated: >60 mL/min (ref >60)
Glucose, Bld: 98 mg/dL (ref 70–99)
Potassium: 4 mmol/L (ref 3.5–5.1)
Sodium: 140 mmol/L (ref 135–145)

## 2019-11-25 LAB — CBC
HCT: 31.2 % — ABNORMAL LOW (ref 39.0–52.0)
Hemoglobin: 9.6 g/dL — ABNORMAL LOW (ref 13.0–17.0)
MCH: 28.5 pg (ref 26.0–34.0)
MCHC: 30.8 g/dL (ref 30.0–36.0)
MCV: 92.6 fL (ref 80.0–100.0)
Platelets: 326 10*3/uL (ref 150–400)
RBC: 3.37 MIL/uL — ABNORMAL LOW (ref 4.22–5.81)
RDW: 13.4 % (ref 11.5–15.5)
WBC: 8.8 10*3/uL (ref 4.0–10.5)
nRBC: 0 % (ref 0.0–0.2)

## 2019-11-25 LAB — URINE CULTURE: Culture: NO GROWTH

## 2019-11-25 MED ORDER — LORAZEPAM 2 MG/ML IJ SOLN: 0.5000 mg | Freq: Two times a day (BID) | INTRAMUSCULAR | Status: DC | PRN

## 2019-11-25 MED ORDER — MORPHINE SULFATE (PF) 2 MG/ML IV SOLN
0.5000 mg | INTRAVENOUS | Status: DC | PRN
  Administered 2019-11-26: 0.5 mg via INTRAVENOUS
  Filled 2019-11-25: qty 1

## 2019-11-25 MED ORDER — METOPROLOL TARTRATE 5 MG/5ML IV SOLN
2.5000 mg | Freq: Once | INTRAVENOUS | Status: AC
  Administered 2019-11-25: 2.5 mg via INTRAVENOUS
  Filled 2019-11-25: qty 5

## 2019-11-25 MED ORDER — DEXTROSE-NACL 5-0.9 % IV SOLN: INTRAVENOUS | Status: DC

## 2019-11-25 NOTE — Progress Notes (Signed)
PROGRESS NOTE    Jesus Harmon  PIR:[id number] DOB: 01-24-1929 DOA: 11/22/2019 PCP: Alroy Dust, L.Marlou Sa, MD    Chief Complaint  Patient presents with  . Fall  . Hip Pain    Brief Narrative:  HPI per Dr. Neysa Bonito This is an 86 year old male with a history of CAD s/p stent, HFpEF, vascular dementia, Hypertension, hyperlipidemia who was sent in from [facility] office for left hip fracture. Patient had a fall onto his left hip several days ago resulting in pain most notably with ambulation. [place] due to poor balance on uneven ground outside. He has fallen multiple times over the past several weeks for similar reasons without LOC and had multiple falls this week. Most recent fall was last night when he was sitting in his chair and leaning over to pick something up off the ground when he lost his balance and struck the back of his head on the wall. No LOC, dizziness, SOB, numbness/weakness or other potential causes to his falls. Struck his right cheek on the ground and suffered a small bruise several weeks ago. His falls prompted him to go to the orthopedic urgent care today with his daughter where he was found to have a left hip fracture by Xray and was sent to [facility] by ortho. Denies any chest pain, palpitations, nausea, vomiting or any other complaints.   ED Course: afebrile, hemodynamically stable on room air. Labs overall unremarkable. CT head with chronic ischemic white matter disease and other chronic issues but no acute findings. CT Cervical spine with severe multilevel DDD and 11x6 mm irregular nodule seen in RUL medially with pleural tail concerning for possible neoplasm.   Assessment & Plan:   Principal Problem:   Hip fracture ([place]) Active Problems:   CAD, NATIVE VESSEL  (06/16/2016): a. sev sten of mid right coronary art PTCA and DES , severe prox LAD stenosis unsucc PTCA, widely patent left main and left circ   Hyperlipidemia   Essential hypertension   CAD (coronary artery disease)   B12  deficiency   Anemia   Pulmonary nodule, right   Dementia with behavioral disturbance (HCC)   Closed displaced fracture of left femoral neck (HCC)  1 left femoral neck fracture status post multiple falls Secondary to mechanical fall.  Patient seen in outpatient orthopedic clinic and sent to the ED.  Patient assessed by orthopedics and patient status post left total hip arthroplasty per Dr. Lyla Glassing 11/23/2019 without any complications.  Aspirin was held preoperatively and has been resumed per orthopedics.  Pain management per orthopedics.  PT/OT.  Per orthopedics.   2.  Hypertension Patient sedated and as such lisinopril held.   3.  Coronary artery disease status post DES in 2018 Status post cardiac cath 06/16/2016: Severe stenosis of mid RCA status post PTCA and DES, severe proximal LAD stenosis with unsuccessful angioplasty.  Aspirin held and resumed per orthopedics.  Continue lisinopril and statin.  Was seen by cardiology for preop clearance on admission.  Outpatient follow-up with cardiology.    4.  Hyperlipidemia Continue statin.  5.  Right Pulmonary nodule,  concerning for neoplasm Noted on CT C-spine.  CT chest with areas of parenchymal lung scarring and atelectasis may be due to inflammation, surveillance is warranted.  1.1 x 1.1 x 0.9 cm right middle lobe nodule opacity noted potentially worrisome for small neoplastic focus.  Will need repeat CT chest or follow-up PET/CT or tissue sampling in 3 months.  Outpatient follow-up with pulmonary.  6.  Vitamin  B12 deficiency Continue vitamin B12 supplementation.   7.  Anemia of chronic disease Likely secondary to vitamin B12 deficiency.  Hemoglobin stable at 9.6. Anemia panel consistent with anemia of chronic disease.  Vitamin B12 levels at 794.  Continue B12 supplementation.  Transfusion threshold hemoglobin <7.  8.  Dementia with behavioral disturbance Patient with a history of underlying vascular dementia.  Patient more aggressive and  agitated overnight and this morning and as such received some IV Ativan.  Patient currently sedated.  No signs or symptoms of infection.  Urinalysis done with trace leukocytes, nitrite negative, 0-5 WBC, rare bacteria.  Patient afebrile.  Continue Zyprexa 5 mg twice daily.  Due to sedation will decrease Ativan to 0.5 mg IV every 12 hours as needed agitation.  Supportive care.  Follow.     DVT prophylaxis: Aspirin.   Code Status: DNR Family Communication: Updated patient and daughter and wife at bedside. Disposition:   Status is: Inpatient    Dispo: The patient is from: Home              Anticipated d/c is to: CIR versus SNF              Anticipated d/c date is: 2 to 3 days              Patient status post hip fracture repair.  PT/ OT.  Not stable for discharge.         Consultants:   Orthopedics: Dr. Wynelle Link 11/22/2019  Cardiology: Dr. Harrell Gave 11/22/2019  Procedures:   CT head CT C-spine 11/22/2019   Left total hip arthroplasty per Dr. Lyla Glassing 11/23/2019  Antimicrobials:   None   Subjective: Patient sedated/asleep.  Patient with some congestion noted.  Daughter and wife at bedside.   Objective: Vitals:   11/24/19 0448 11/24/19 0906 11/24/19 2100 11/25/19 0436  BP: (!) 141/70 (!) 155/65 (!) 148/85 (!) 142/57  Pulse: 86 95 95 77  Resp: 19 16 19 20   Temp: 97.8 F (36.6 C) 98.5 F (36.9 C) 99.2 F (37.3 C) 99.3 F (37.4 C)  TempSrc: Oral Oral Oral   SpO2: 93% 97% 96% 93%  Weight:      Height:        Intake/Output Summary (Last 24 hours) at 11/25/2019 1217 Last data filed at 11/25/2019 0437 Gross per 24 hour  Intake 895.16 ml  Output 750 ml  Net 145.16 ml   Filed Weights   11/23/19 1534  Weight: 61.2 kg    Examination:  General exam: Sedated.  Is sleepy. Respiratory system: Lungs clear to auscultation bilaterally anterior lung fields.  No wheezes, no crackles, no rhonchi.  Normal respiratory effort.  Cardiovascular system: RRR no murmurs rubs  or gallops.  No JVD.  No lower extremity edema.   Gastrointestinal system: Abdomen is soft, nontender, nondistended, positive bowel sounds.  No rebound.  No guarding.  Central nervous system: Sedated.  Moving extremities spontaneously.  Extremities: Symmetric 5 x 5 power. Skin: No rashes, lesions or ulcers Psychiatry: Judgement and insight poor l. Mood & affect appropriate.     Data Reviewed: I have personally reviewed following labs and imaging studies  CBC: Recent Labs  Lab 11/22/19 1212 11/23/19 0819 11/24/19 0554 11/25/19 0533  WBC 8.4 7.9 12.6* 8.8  NEUTROABS 6.2 6.3 10.7*  --   HGB 9.6* 9.3* 9.3* 9.6*  HCT 31.4* 29.9* 30.2* 31.2*  MCV 93.5 93.4 93.2 92.6  PLT 357 357 358 350    Basic Metabolic Panel: Recent Labs  Lab 11/22/19 1212 11/23/19 0819 11/24/19 0554 11/25/19 0533  NA 139 138 136 140  K 3.9 4.1 4.2 4.0  CL 103 104 102 106  CO2 26 24 24 25   GLUCOSE 108* 102* 113* 98  BUN 19 19 25* 19  CREATININE 1.02 0.84 1.12 0.83  CALCIUM 9.1 8.9 9.0 8.8*  MG  --  2.0  --   --     GFR: Estimated Creatinine Clearance: 53.3 mL/min (by C-G formula based on SCr of 0.83 mg/dL).  Liver Function Tests: No results for input(s): AST, ALT, ALKPHOS, BILITOT, PROT, ALBUMIN in the last 168 hours.  CBG: No results for input(s): GLUCAP in the last 168 hours.   Recent Results (from the past 240 hour(s))  Respiratory Panel by RT PCR (Flu A&B, Covid) - Nasopharyngeal Swab     Status: None   Collection Time: 11/22/19 12:13 PM   Specimen: Nasopharyngeal Swab  Result Value Ref Range Status   SARS Coronavirus 2 by RT PCR NEGATIVE NEGATIVE Final    Comment: (NOTE) SARS-CoV-2 target nucleic acids are NOT DETECTED.  The SARS-CoV-2 RNA is generally detectable in upper respiratoy specimens during the acute phase of infection. The lowest concentration of SARS-CoV-2 viral copies this assay can detect is 131 copies/mL. A negative result does not preclude SARS-Cov-2 infection and  should not be used as the sole basis for treatment or other patient management decisions. A negative result may occur with  improper specimen collection/handling, submission of specimen other than nasopharyngeal swab, presence of viral mutation(s) within the areas targeted by this assay, and inadequate number of viral copies (<131 copies/mL). A negative result must be combined with clinical observations, patient history, and epidemiological information. The expected result is Negative.  Fact Sheet for Patients:  PinkCheek.be  Fact Sheet for Healthcare Providers:  GravelBags.it  This test is no t yet approved or cleared by the [place] FDA and  has been authorized for detection and/or diagnosis of SARS-CoV-2 by FDA under an Emergency Use Authorization (EUA). This EUA will remain  in effect (meaning this test can be used) for the duration of the COVID-19 declaration under Section 564(b)(1) of the Act, 21 U.S.C. section 360bbb-3(b)(1), unless the authorization is terminated or revoked sooner.     Influenza A by PCR NEGATIVE NEGATIVE Final   Influenza B by PCR NEGATIVE NEGATIVE Final    Comment: (NOTE) The Xpert Xpress SARS-CoV-2/FLU/RSV assay is intended as an aid in  the diagnosis of influenza from Nasopharyngeal swab specimens and  should not be used as a sole basis for treatment. Nasal washings and  aspirates are unacceptable for Xpert Xpress SARS-CoV-2/FLU/RSV  testing.  Fact Sheet for Patients: PinkCheek.be  Fact Sheet for Healthcare Providers: GravelBags.it  This test is not yet approved or cleared by the [place] FDA and  has been authorized for detection and/or diagnosis of SARS-CoV-2 by  FDA under an Emergency Use Authorization (EUA). This EUA will remain  in effect (meaning this test can be used) for the duration of the  Covid-19 declaration  under Section 564(b)(1) of the Act, 21  U.S.C. section 360bbb-3(b)(1), unless the authorization is  terminated or revoked. Performed at [facility], [place] [address] [place], [place] [address]   Culture, Urine     Status: None   Collection Time: 11/24/19  8:35 AM   Specimen: Urine, Catheterized  Result Value Ref Range Status   Specimen Description   Final    URINE, CATHETERIZED Performed at [facility], [place] [address], [place], [place] [address]    Special Requests   Final    URINE, RANDOM Performed at [facility] [address] [place], [place] [address]    Culture   Final    NO GROWTH Performed at [facility], [place] [address], [place], [place] [address]    Report Status 11/25/2019 FINAL  Final         Radiology Studies: Pelvis Portable  Result Date: 11/23/2019 CLINICAL DATA:  Postop left hip replacement EXAM: PORTABLE PELVIS 1-2 VIEWS COMPARISON:  None. FINDINGS: Mild to moderate arthritis of the right hip. Pubic symphysis and rami appear intact. Status post left hip replacement with intact hardware and normal alignment. No fracture. Gas in the soft tissues consistent with recent surgery IMPRESSION: Status post left hip replacement with expected postsurgical changes. Electronically Signed   By: Donavan Foil M.D.   On: 11/23/2019 20:24   DG C-Arm 1-60 Min-No Report  Result Date: 11/23/2019 Fluoroscopy was utilized by the requesting physician.  No radiographic interpretation.   DG HIP OPERATIVE UNILAT W OR W/O PELVIS LEFT  Result Date: 11/23/2019 CLINICAL DATA:  Left hip arthroplasty EXAM: OPERATIVE left HIP (WITH PELVIS IF PERFORMED) 2 VIEWS TECHNIQUE: Fluoroscopic spot image(s) were submitted for interpretation post-operatively. COMPARISON:  None FINDINGS: Two low resolution intraoperative spot views of the left hip. Total fluoroscopy time was 8 seconds. The images demonstrate a left hip replacement with  normal alignment IMPRESSION: Intraoperative fluoroscopic assistance provided during left hip replacement. Electronically Signed   By: Donavan Foil M.D.   On: 11/23/2019 20:23        Scheduled Meds: . aspirin EC  81 mg Oral Daily  . Chlorhexidine Gluconate Cloth  6 each Topical Daily  . docusate sodium  100 mg Oral BID  . enoxaparin (LOVENOX) injection  30 mg Subcutaneous Q24H  . feeding supplement  237 mL Oral BID BM  . latanoprost  1 drop Both Eyes QHS  . lisinopril  10 mg Oral Daily  . loratadine  10 mg Oral q1800  . nutrition supplement (JUVEN)  1 packet Oral BID BM  . OLANZapine zydis  5 mg Oral BID  . simvastatin  20 mg Oral Daily  . vitamin B-12  1,000 mcg Oral Daily   Continuous Infusions:    LOS: 3 days    Time spent: 35 minutes    Irine Seal, MD [facility]   To contact the attending provider between 7A-7P or the covering provider during after hours 7P-7A, please log into the web site [email or web address] and access using universal [place] password for that web site. If you do not have the password, please call the hospital operator.  11/25/2019, 12:17 PM

## 2019-11-25 NOTE — Progress Notes (Signed)
Inpatient Rehab Admissions Coordinator:   I spoke with pt.'s daughter, Manon Hilding to discuss  potential CIR admission. Pt. Stated interest, understands that Pt.'s insurer may not approve. SW is also working on SNF as a backup. Will pursue for potential admit next week, pending bed availability and insurance authorization. I plan to see Pt. At [facility] this afternoon.   Clemens Catholic, [place], [place] Admissions Coordinator  [phone] ([place]) [phone] (office)

## 2019-11-25 NOTE — TOC Initial Note (Signed)
Transition of Care [facility]) - Initial/Assessment Note    Patient Details  Name: Jesus Harmon MRN: [id number] Date of Birth: 29-Mar-1929  Transition of Care [facility]) CM/SW Contact:    Shade Flood, LCSW Phone Number: 11/25/2019, 1:26 PM  Clinical Narrative:                  Pt admitted from home. PT recommending CIR at dc. Met with pt's wife and daughter today to assess. Pt sleeping at the time of LCSW visit. Pt's wife reports that they are very interested in [facility] for pt's rehab. She is aware that they are continuing to evaluate pt's eligibility and that pt would need insurance authorization as well. Discussed SNF as backup plan if CIR doesn't work out. Pt's wife agreeable to referral to [facility] as back up plan but she reiterates that she really wants pt to go to CIR as she has two family members that work over at [employer].  TOC will refer and follow. Pt will need insurance auth for either disposition.  Expected Discharge Plan: IP Rehab Facility Barriers to Discharge: Continued Medical Work up   Patient Goals and CMS Choice Patient states their goals for this hospitalization and ongoing recovery are:: get better CMS Medicare.gov Compare Post Acute Care list provided to:: Patient Represenative (must comment) Choice offered to / list presented to : Spouse  Expected Discharge Plan and Services Expected Discharge Plan: [facility] In-house Referral: Clinical Social Work   Post Acute Care Choice: IP Rehab Living arrangements for the past 2 months: [place]                                      Prior Living Arrangements/Services Living arrangements for the past 2 months: Single Family Home Lives with:: Spouse Patient language and need for interpreter reviewed:: Yes Do you feel safe going back to the place where you live?: Yes      Need for Family Participation in Patient Care: Yes (Comment) Care giver support system in place?: Yes (comment)   Criminal  Activity/Legal Involvement Pertinent to Current Situation/Hospitalization: No - Comment as needed  Activities of Daily Living Home Assistive Devices/Equipment: [place] (specify type), Eyeglasses (4 wheeled [place]) ADL Screening (condition at time of admission) Patient's cognitive ability adequate to safely complete daily activities?: Yes Is the patient deaf or have difficulty hearing?: Yes Does the patient have difficulty seeing, even when wearing glasses/contacts?: No Does the patient have difficulty concentrating, remembering, or making decisions?: Yes Patient able to express need for assistance with ADLs?: Yes Does the patient have difficulty dressing or bathing?: No Independently performs ADLs?: No Communication: Independent Dressing (OT): Independent Grooming: Independent Feeding: Independent Bathing: Independent Toileting: Dependent Is this a change from baseline?: Change from baseline, expected to last >3days In/Out Bed: Dependent Is this a change from baseline?: Change from baseline, expected to last >3 days Walks in Home: Dependent Is this a change from baseline?: Change from baseline, expected to last >3 days Does the patient have difficulty walking or climbing stairs?: Yes Weakness of Legs: Both (L>R) Weakness of Arms/Hands: None  Permission Sought/Granted Permission sought to share information with : Facility Art therapist granted to share information with : Yes, Verbal Permission Granted     Permission granted to share info w AGENCY: sn[facility]        Emotional Assessment Appearance:: Appears stated age     Orientation: :  Oriented to Self Alcohol / Substance Use: Not Applicable Psych Involvement: No (comment)  Admission diagnosis:  Hip fracture ([place]) [S72.009A] Pulmonary nodule [R91.1] Closed fracture of left hip, initial encounter [facility]) [S72.002A] Patient Active Problem List   Diagnosis Date Noted  . Dementia with behavioral disturbance ([place])  11/24/2019  . Closed displaced fracture of left femoral neck ([place])   . B12 deficiency 11/23/2019  . Anemia 11/23/2019  . Pulmonary nodule, right 11/23/2019  . Hip fracture ([place]) 11/22/2019  . Pain due to onychomycosis of toenails of both feet 11/05/2019  . Vascular dementia with behavior disturbance ([place]) 10/17/2019  . Dementia without behavioral disturbance ([place]) 05/13/2019  . Bilateral impacted cerumen 11/29/2018  . Presbycusis of both ears 11/29/2018  . Vasomotor rhinitis 11/29/2018  . S/P inguinal hernia repair 12/22/2016  . CAD (coronary artery disease) 06/16/2016  . Coronary artery disease involving native coronary artery of native heart with angina pectoris ([place]) 06/16/2016  . Benign hypertrophy of prostate 03/04/2014  . Hyperlipidemia 02/17/2010  . Essential hypertension 02/17/2010  . CAD, NATIVE VESSEL  (06/16/2016): a. sev sten of mid right coronary art PTCA and DES , severe prox LAD stenosis unsucc PTCA, widely patent left main and left circ 11/14/2009  . FATIGUE 11/12/2009  . ARM NUMBNESS 11/12/2009  . DYSPNEA 11/12/2009   PCP:  Alroy Dust, L.Marlou Sa, MD Pharmacy:   [place], [place] - [address] [facility]. [address] [place] [address] Phone: [phone] Fax: [phone]     Social Determinants of Health (SDOH) Interventions    Readmission Risk Interventions Readmission Risk Prevention Plan 11/25/2019  Medication Screening Complete  Transportation Screening Complete  Some recent data might be hidden

## 2019-11-25 NOTE — Progress Notes (Signed)
   Subjective: 2 Days Post-Op Procedure(s) (LRB): TOTAL HIP ARTHROPLASTY ANTERIOR APPROACH (Left) Patient sleeping comfortably in bed today. Arousable but goes right back to sleep  Objective: Vital signs in last 24 hours: Temp:  [98.5 F (36.9 C)-99.3 F (37.4 C)] 99.3 F (37.4 C) (11/14 0436) Pulse Rate:  [77-95] 77 (11/14 0436) Resp:  [16-20] 20 (11/14 0436) BP: (142-155)/(57-85) 142/57 (11/14 0436) SpO2:  [93 %-97 %] 93 % (11/14 0436)  Intake/Output from previous day:  Intake/Output Summary (Last 24 hours) at 11/25/2019 0756 Last data filed at 11/25/2019 0437 Gross per 24 hour  Intake 895.16 ml  Output 1000 ml  Net -104.84 ml    Intake/Output this shift: No intake/output data recorded.  Labs: Recent Labs    11/22/19 1212 11/23/19 0819 11/24/19 0554 11/25/19 0533  HGB 9.6* 9.3* 9.3* 9.6*   Recent Labs    11/24/19 0554 11/25/19 0533  WBC 12.6* 8.8  RBC 3.24* 3.37*  HCT 30.2* 31.2*  PLT 358 326   Recent Labs    11/24/19 0554 11/25/19 0533  NA 136 140  K 4.2 4.0  CL 102 106  CO2 24 25  BUN 25* 19  CREATININE 1.12 0.83  GLUCOSE 113* 98  CALCIUM 9.0 8.8*   No results for input(s): LABPT, INR in the last 72 hours.  EXAM General - Patient is sleeping during exam Extremity - Intact pulses distally No cellulitis present Compartment soft No edema Dressing/Incision - clean, dry, no drainage   Past Medical History:  Diagnosis Date  . Arthritis    back and neck (06/16/2016)  . Bladder cancer ([place])   . Blockage of coronary artery of heart (HCC)    "widow maker", had stenting of the R side but L main is still blocked   . CAD (coronary artery disease)    a. LHC 10/2009:  pLAD 50%, small D1 70-80% (too small for PCI), EF 65% => med Rx  b. Myoview 10/2009: EF 65%, no ischemia;  c.  ETT-MV 3/14:  No ischemia, EF 63%  . Cancer [facility])    sin cancer removed nose   . Diverticulosis   . Glaucoma   . History of echocardiogram    Echo 02/2018: EF 85-27,  +diastolic dysfunction, no RWMA, normal RVSF, trivial MR  . History of kidney stones    "passed them"  . HLD (hyperlipidemia)    "on RX; never had high cholestrol" (06/16/2016)  . HTN (hypertension)    "on RX; never HTN" (06/16/2016)  . Migraine    "in high school" (06/16/2016)    Assessment/Plan: 2 Days Post-Op Procedure(s) (LRB): TOTAL HIP ARTHROPLASTY ANTERIOR APPROACH (Left) Principal Problem:   Hip fracture (HCC) Active Problems:   CAD, NATIVE VESSEL  (06/16/2016): a. sev sten of mid right coronary art PTCA and DES , severe prox LAD stenosis unsucc PTCA, widely patent left main and left circ   Hyperlipidemia   Essential hypertension   CAD (coronary artery disease)   B12 deficiency   Anemia   Pulmonary nodule, right   Dementia with behavioral disturbance (HCC)   Closed displaced fracture of left femoral neck ([place])   Up with therapy  Social work for SNF vs CIR  DVT Prophylaxis - Aspirin Weight Bearing As Tolerated left Leg  Jesus Harmon 11/25/2019, 7:56 AM

## 2019-11-25 NOTE — Progress Notes (Signed)
OT Cancellation Note  Patient Details Name: Jesus Harmon MRN: [id number] DOB: 02-28-1929   Cancelled Treatment:    Reason Eval/Treat Not Completed: Fatigue/lethargy limiting ability to participate: Attempted several min to awaken patient who would not respond or open eyes.  Per family, pt has had little to no sleep since Thursday and was given medication to assist with sleep.  Plan to return tomorrow is able.  RN notified.   Julien Girt 11/25/2019, 3:08 PM

## 2019-11-25 NOTE — NC FL2 (Signed)
[place] LEVEL OF CARE SCREENING TOOL     IDENTIFICATION  Patient Name: Jesus Harmon Birthdate: Sep 05, 1929 Sex: male Admission Date (Current Location): 11/22/2019  [facility] and [place] Number:  Herbalist and Address:  [facility],  [place], [place]      Provider Number: [id number]  Attending Physician Name and Address:  Eugenie Filler, MD  Relative Name and Phone Number:       Current Level of Care: Hospital Recommended Level of Care: [place] Prior Approval Number:    Date Approved/Denied:   PASRR Number: [id number]  Discharge Plan: SNF    Current Diagnoses: Patient Active Problem List   Diagnosis Date Noted  . Dementia with behavioral disturbance ([place]) 11/24/2019  . Closed displaced fracture of left femoral neck ([place])   . B12 deficiency 11/23/2019  . Anemia 11/23/2019  . Pulmonary nodule, right 11/23/2019  . Hip fracture ([place]) 11/22/2019  . Pain due to onychomycosis of toenails of both feet 11/05/2019  . Vascular dementia with behavior disturbance ([employer]) 10/17/2019  . Dementia without behavioral disturbance ([place]) 05/13/2019  . Bilateral impacted cerumen 11/29/2018  . Presbycusis of both ears 11/29/2018  . Vasomotor rhinitis 11/29/2018  . S/P inguinal hernia repair 12/22/2016  . CAD (coronary artery disease) 06/16/2016  . Coronary artery disease involving native coronary artery of native heart with angina pectoris ([place]) 06/16/2016  . Benign hypertrophy of prostate 03/04/2014  . Hyperlipidemia 02/17/2010  . Essential hypertension 02/17/2010  . CAD, NATIVE VESSEL  (06/16/2016): a. sev sten of mid right coronary art PTCA and DES , severe prox LAD stenosis unsucc PTCA, widely patent left main and left circ 11/14/2009  . FATIGUE 11/12/2009  . ARM NUMBNESS 11/12/2009  . DYSPNEA 11/12/2009    Orientation RESPIRATION BLADDER Height & Weight     Self  Normal Incontinent Weight: 135 lb  (61.2 kg) Height:  5\' 11"  (180.3 cm)  BEHAVIORAL SYMPTOMS/MOOD NEUROLOGICAL BOWEL NUTRITION STATUS      Continent Diet (see dc summary)  AMBULATORY STATUS COMMUNICATION OF NEEDS Skin   Extensive Assist Verbally Surgical wounds                       Personal Care Assistance Level of Assistance  Bathing, Feeding, Dressing Bathing Assistance: Limited assistance Feeding assistance: Independent Dressing Assistance: Limited assistance     Functional Limitations Info  Sight, Hearing, Speech Sight Info: Adequate Hearing Info: Adequate Speech Info: Adequate    SPECIAL CARE FACTORS FREQUENCY  PT (By licensed PT), OT (By licensed OT)     PT Frequency: 5x week OT Frequency: 3x week            Contractures Contractures Info: Not present    Additional Factors Info  Code Status, Allergies Code Status Info: DNR Allergies Info: Amoxicillin, Sulfonamide Derivatives, Sulfamethoxazole           Current Medications (11/25/2019):  This is the current hospital active medication list Current Facility-Administered Medications  Medication Dose Route Frequency Provider Last Rate Last Admin  . aspirin EC tablet 81 mg  81 mg Oral Daily Justice Britain, MD   81 mg at 11/24/19 1312  . Chlorhexidine Gluconate Cloth 2 % PADS 6 each  6 each Topical Daily Eugenie Filler, MD   6 each at 11/25/19 1211  . docusate sodium (COLACE) capsule 100 mg  100 mg Oral BID Rod Can, MD   100 mg at 11/24/19 2205  .  enoxaparin (LOVENOX) injection 30 mg  30 mg Subcutaneous Q24H Rod Can, MD   30 mg at 11/25/19 1211  . feeding supplement (ENSURE ENLIVE / ENSURE PLUS) liquid 237 mL  237 mL Oral BID BM Eugenie Filler, MD      . HYDROcodone-acetaminophen (NORCO/VICODIN) 5-325 MG per tablet 1-2 tablet  1-2 tablet Oral Q6H PRN Rod Can, MD   1 tablet at 11/23/19 0427  . latanoprost (XALATAN) 0.005 % ophthalmic solution 1 drop  1 drop Both Eyes QHS Rod Can, MD   1 drop at 11/24/19  2154  . lisinopril (ZESTRIL) tablet 10 mg  10 mg Oral Daily Rod Can, MD   10 mg at 11/24/19 1312  . loratadine (CLARITIN) tablet 10 mg  10 mg Oral q1800 Swinteck, Aaron Edelman, MD      . LORazepam (ATIVAN) injection 0.5 mg  0.5 mg Intravenous Q12H PRN Eugenie Filler, MD      . menthol-cetylpyridinium (CEPACOL) lozenge 3 mg  1 lozenge Oral PRN Rod Can, MD       Or  . phenol (CHLORASEPTIC) mouth spray 1 spray  1 spray Mouth/Throat PRN Swinteck, Aaron Edelman, MD      . metoCLOPramide (REGLAN) tablet 5-10 mg  5-10 mg Oral Q8H PRN Swinteck, Aaron Edelman, MD       Or  . metoCLOPramide (REGLAN) injection 5-10 mg  5-10 mg Intravenous Q8H PRN Swinteck, Aaron Edelman, MD      . morphine 2 MG/ML injection 0.5 mg  0.5 mg Intravenous Q4H PRN Eugenie Filler, MD      . nitroGLYCERIN (NITROSTAT) SL tablet 0.4 mg  0.4 mg Sublingual Q5 Min x 3 PRN Rod Can, MD      . nutrition supplement (JUVEN) (JUVEN) powder packet 1 packet  1 packet Oral BID BM Eugenie Filler, MD   1 packet at 11/24/19 1313  . OLANZapine zydis (ZYPREXA) disintegrating tablet 5 mg  5 mg Oral BID Eugenie Filler, MD   5 mg at 11/24/19 2205  . ondansetron (ZOFRAN) tablet 4 mg  4 mg Oral Q6H PRN Swinteck, Aaron Edelman, MD       Or  . ondansetron (ZOFRAN) injection 4 mg  4 mg Intravenous Q6H PRN Swinteck, Aaron Edelman, MD      . polyethylene glycol (MIRALAX / GLYCOLAX) packet 17 g  17 g Oral Daily PRN Swinteck, Aaron Edelman, MD      . simvastatin (ZOCOR) tablet 20 mg  20 mg Oral Daily Rod Can, MD   20 mg at 11/23/19 0914  . tiZANidine (ZANAFLEX) tablet 2 mg  2 mg Oral Daily PRN Swinteck, Aaron Edelman, MD      . vitamin B-12 (CYANOCOBALAMIN) tablet 1,000 mcg  1,000 mcg Oral Daily Rod Can, MD   1,000 mcg at 11/23/19 1007     Discharge Medications: Please see discharge summary for a list of discharge medications.  Relevant Imaging Results:  Relevant Lab Results:   Additional Information SSN: [id number], [place]

## 2019-11-26 DIAGNOSIS — D649 Anemia, unspecified: Secondary | ICD-10-CM | POA: Diagnosis not present

## 2019-11-26 DIAGNOSIS — I2510 Atherosclerotic heart disease of native coronary artery without angina pectoris: Secondary | ICD-10-CM | POA: Diagnosis not present

## 2019-11-26 DIAGNOSIS — S72002A Fracture of unspecified part of neck of left femur, initial encounter for closed fracture: Secondary | ICD-10-CM | POA: Diagnosis not present

## 2019-11-26 DIAGNOSIS — E538 Deficiency of other specified B group vitamins: Secondary | ICD-10-CM | POA: Diagnosis not present

## 2019-11-26 LAB — BASIC METABOLIC PANEL
Anion gap: 6 (ref 5–15)
BUN: 21 mg/dL (ref 8–23)
CO2: 26 mmol/L (ref 22–32)
Calcium: 8.5 mg/dL — ABNORMAL LOW (ref 8.9–10.3)
Chloride: 108 mmol/L (ref 98–111)
Creatinine, Ser: 0.81 mg/dL (ref 0.61–1.24)
GFR, Estimated: >60 mL/min (ref >60)
Glucose, Bld: 121 mg/dL — ABNORMAL HIGH (ref 70–99)
Potassium: 3.3 mmol/L — ABNORMAL LOW (ref 3.5–5.1)
Sodium: 140 mmol/L (ref 135–145)

## 2019-11-26 LAB — CBC
HCT: 28.3 % — ABNORMAL LOW (ref 39.0–52.0)
Hemoglobin: 8.7 g/dL — ABNORMAL LOW (ref 13.0–17.0)
MCH: 28.5 pg (ref 26.0–34.0)
MCHC: 30.7 g/dL (ref 30.0–36.0)
MCV: 92.8 fL (ref 80.0–100.0)
Platelets: 296 10*3/uL (ref 150–400)
RBC: 3.05 MIL/uL — ABNORMAL LOW (ref 4.22–5.81)
RDW: 13.5 % (ref 11.5–15.5)
WBC: 9.9 10*3/uL (ref 4.0–10.5)
nRBC: 0 % (ref 0.0–0.2)

## 2019-11-26 MED ORDER — HYDROCODONE-ACETAMINOPHEN 5-325 MG PO TABS
1.0000 | ORAL_TABLET | Freq: Four times a day (QID) | ORAL | 0 refills | Status: DC | PRN
Start: 2019-11-26 — End: 2020-03-19

## 2019-11-26 MED ORDER — ASPIRIN EC 81 MG PO TBEC: 81.0000 mg | DELAYED_RELEASE_TABLET | Freq: Two times a day (BID) | ORAL | 0 refills | Status: AC

## 2019-11-26 MED ORDER — ASPIRIN 81 MG PO CHEW: 81.0000 mg | CHEWABLE_TABLET | Freq: Every day | ORAL | 11 refills | Status: DC

## 2019-11-26 MED ORDER — POTASSIUM CHLORIDE CRYS ER 20 MEQ PO TBCR
40.0000 meq | EXTENDED_RELEASE_TABLET | Freq: Once | ORAL | Status: AC
  Administered 2019-11-26: 40 meq via ORAL
  Filled 2019-11-26: qty 2

## 2019-11-26 NOTE — Plan of Care (Signed)
  Problem: Education: Goal: Knowledge of General Education information will improve Description: Including pain rating scale, medication(s)/side effects and non-pharmacologic comfort measures Outcome: Progressing   Problem: Clinical Measurements: Goal: Will remain free from infection Outcome: Progressing Goal: Diagnostic test results will improve Outcome: Progressing Goal: Respiratory complications will improve Outcome: Progressing Goal: Cardiovascular complication will be avoided Outcome: Progressing   Problem: Activity: Goal: Risk for activity intolerance will decrease Outcome: Progressing   Problem: Elimination: Goal: Will not experience complications related to bowel motility Outcome: Progressing Goal: Will not experience complications related to urinary retention Outcome: Progressing   Problem: Pain Managment: Goal: General experience of comfort will improve Outcome: Progressing   Problem: Safety: Goal: Ability to remain free from injury will improve Outcome: Progressing   Problem: Skin Integrity: Goal: Risk for impaired skin integrity will decrease Outcome: Progressing

## 2019-11-26 NOTE — Progress Notes (Signed)
IV Team will not start PIV until Pt is placed back in bed, noted. Pt will like to stay in chair for a while and daughter agree. IV team Nurse will start a new PIV when PT is in bed according to IV Team RN, noted. Pt fluids are paused, noted. IV Team stated to call them when pt is back in bed.

## 2019-11-26 NOTE — Progress Notes (Signed)
PROGRESS NOTE    Jesus Harmon  HBZ:[id number] DOB: May 30, 1929 DOA: 11/22/2019 PCP: Alroy Dust, L.Marlou Sa, MD    Chief Complaint  Patient presents with  . Fall  . Hip Pain    Brief Narrative:  HPI per Dr. Neysa Bonito This is an 86 year old male with a history of CAD s/p stent, HFpEF, vascular dementia, Hypertension, hyperlipidemia who was sent in from [facility] office for left hip fracture. Patient had a fall onto his left hip several days ago resulting in pain most notably with ambulation. [place] due to poor balance on uneven ground outside. He has fallen multiple times over the past several weeks for similar reasons without LOC and had multiple falls this week. Most recent fall was last night when he was sitting in his chair and leaning over to pick something up off the ground when he lost his balance and struck the back of his head on the wall. No LOC, dizziness, SOB, numbness/weakness or other potential causes to his falls. Struck his right cheek on the ground and suffered a small bruise several weeks ago. His falls prompted him to go to the orthopedic urgent care today with his daughter where he was found to have a left hip fracture by Xray and was sent to [facility] by ortho. Denies any chest pain, palpitations, nausea, vomiting or any other complaints.   ED Course: afebrile, hemodynamically stable on room air. Labs overall unremarkable. CT head with chronic ischemic white matter disease and other chronic issues but no acute findings. CT Cervical spine with severe multilevel DDD and 11x6 mm irregular nodule seen in RUL medially with pleural tail concerning for possible neoplasm.   Assessment & Plan:   Principal Problem:   Hip fracture ([place]) Active Problems:   CAD, NATIVE VESSEL  (06/16/2016): a. sev sten of mid right coronary art PTCA and DES , severe prox LAD stenosis unsucc PTCA, widely patent left main and left circ   Hyperlipidemia   Essential hypertension   CAD (coronary artery disease)   B12  deficiency   Anemia   Pulmonary nodule, right   Dementia with behavioral disturbance (HCC)   Closed displaced fracture of left femoral neck (HCC)  1 left femoral neck fracture status post multiple falls Secondary to mechanical fall.  Patient seen in outpatient orthopedic clinic and sent to the ED.  Patient assessed by orthopedics and patient status post left total hip arthroplasty per Dr. Lyla Glassing 11/23/2019 without any complications.  Aspirin was held preoperatively and has been resumed per orthopedics.  Pain management per orthopedics.  PT/OT.  CIR versus home with home health.  Per orthopedics.   2.  Hypertension Controlled on current regimen of lisinopril.    3.  Coronary artery disease status post DES in 2018 Status post cardiac cath 06/16/2016: Severe stenosis of mid RCA status post PTCA and DES, severe proximal LAD stenosis with unsuccessful angioplasty.  Aspirin held and resumed per orthopedics.  Continue lisinopril and statin.  Was seen by cardiology for preop clearance on admission.  Outpatient follow-up with cardiology.    4.  Hyperlipidemia Statin.    5.  Right Pulmonary nodule,  concerning for neoplasm Noted on CT C-spine.  CT chest with areas of parenchymal lung scarring and atelectasis may be due to inflammation, surveillance is warranted.  1.1 x 1.1 x 0.9 cm right middle lobe nodule opacity noted potentially worrisome for small neoplastic focus.  Will need repeat CT chest or follow-up PET/CT or tissue sampling in 3 months.  Outpatient follow-up with pulmonary.  6.  Vitamin B12 deficiency Vitamin B12 supplementation.   7.  Anemia of chronic disease Likely secondary to vitamin B12 deficiency.  Hemoglobin currently stable at 8.7.  Likely dilutional effect as patient placed on gentle hydration yesterday.  Anemia panel consistent with anemia of chronic disease.  Vitamin B12 levels at 794.  Continue B12 supplementation.  Transfusion threshold hemoglobin <7.  8.  Dementia with  behavioral disturbance Patient with a history of underlying vascular dementia.  Patient noted to be agitated and aggressive over the past 2 days felt likely secondary to sundowning.  Patient has improved clinically.  Not aggressive.  Not agitated.  Pleasant.  Following commands.  Patient with no signs or symptoms of infection.  Afebrile.  Continue Zyprexa 5 mg twice daily.  IV Ativan has been discontinued due to excessive sedation.  Follow.  9.  Hypokalemia Replete.   DVT prophylaxis: Aspirin.   Code Status: DNR Family Communication: Updated patient and daughter, [name] at bedside.   Disposition:   Status is: Inpatient    Dispo: The patient is from: Home              Anticipated d/c is to: CIR versus home with home health.               Anticipated d/c date is: 11/27/2019              Patient status post hip fracture repair.  PT/ OT.  Not stable for discharge.         Consultants:   Orthopedics: Dr. Wynelle Link 11/22/2019  Cardiology: Dr. Harrell Gave 11/22/2019  Procedures:   CT head CT C-spine 11/22/2019   Left total hip arthroplasty per Dr. Lyla Glassing 11/23/2019  Antimicrobials:   None   Subjective: Patient sitting up in chair.  Alert.  Not agitated.  Appropriate.  Following commands.  Noted to be ambulating in the hallway with physical therapy.  Daughter at bedside.    Objective: Vitals:   11/25/19 2100 11/26/19 0259 11/26/19 0529 11/26/19 1044  BP: (!) 126/59 (!) 113/58 (!) 125/53 129/65  Pulse: 83 78  87  Resp: (!) 22 (!) 23 (!) 22 18  Temp: 99 F (37.2 C) 99.5 F (37.5 C) 99.3 F (37.4 C) 98.5 F (36.9 C)  TempSrc: Axillary Oral Oral Oral  SpO2: 99% 93% 96% 96%  Weight:      Height:        Intake/Output Summary (Last 24 hours) at 11/26/2019 1224 Last data filed at 11/26/2019 0900 Gross per 24 hour  Intake 978.54 ml  Output 900 ml  Net 78.54 ml   Filed Weights   11/23/19 1534  Weight: 61.2 kg    Examination:  General exam: Alert. Respiratory  system: CTAB.  No wheezes, no crackles, no rhonchi.  Normal respiratory effort.  Cardiovascular system: Regular rate and rhythm no murmurs rubs or gallops.  No JVD.  No lower extremity edema.  Gastrointestinal system: Abdomen is soft, nontender, nondistended, positive bowel sounds.  No rebound.  No guarding.  Central nervous system: Alert.  Not agitated.  Moving extremities spontaneously.   Extremities: Symmetric 5 x 5 power. Skin: No rashes, lesions or ulcers Psychiatry: Judgement and insight fair. Mood & affect appropriate.     Data Reviewed: I have personally reviewed following labs and imaging studies  CBC: Recent Labs  Lab 11/22/19 1212 11/23/19 0819 11/24/19 0554 11/25/19 0533 11/26/19 0440  WBC 8.4 7.9 12.6* 8.8 9.9  NEUTROABS 6.2 6.3 10.7*  --   --  HGB 9.6* 9.3* 9.3* 9.6* 8.7*  HCT 31.4* 29.9* 30.2* 31.2* 28.3*  MCV 93.5 93.4 93.2 92.6 92.8  PLT 357 357 358 326 536    Basic Metabolic Panel: Recent Labs  Lab 11/22/19 1212 11/23/19 0819 11/24/19 0554 11/25/19 0533 11/26/19 0440  NA 139 138 136 140 140  K 3.9 4.1 4.2 4.0 3.3*  CL 103 104 102 106 108  CO2 26 24 24 25 26   GLUCOSE 108* 102* 113* 98 121*  BUN 19 19 25* 19 21  CREATININE 1.02 0.84 1.12 0.83 0.81  CALCIUM 9.1 8.9 9.0 8.8* 8.5*  MG  --  2.0  --   --   --     GFR: Estimated Creatinine Clearance: 54.6 mL/min (by C-G formula based on SCr of 0.81 mg/dL).  Liver Function Tests: No results for input(s): AST, ALT, ALKPHOS, BILITOT, PROT, ALBUMIN in the last 168 hours.  CBG: No results for input(s): GLUCAP in the last 168 hours.   Recent Results (from the past 240 hour(s))  Respiratory Panel by RT PCR (Flu A&B, Covid) - Nasopharyngeal Swab     Status: None   Collection Time: 11/22/19 12:13 PM   Specimen: Nasopharyngeal Swab  Result Value Ref Range Status   SARS Coronavirus 2 by RT PCR NEGATIVE NEGATIVE Final    Comment: (NOTE) SARS-CoV-2 target nucleic acids are NOT DETECTED.  The SARS-CoV-2  RNA is generally detectable in upper respiratoy specimens during the acute phase of infection. The lowest concentration of SARS-CoV-2 viral copies this assay can detect is 131 copies/mL. A negative result does not preclude SARS-Cov-2 infection and should not be used as the sole basis for treatment or other patient management decisions. A negative result may occur with  improper specimen collection/handling, submission of specimen other than nasopharyngeal swab, presence of viral mutation(s) within the areas targeted by this assay, and inadequate number of viral copies (<131 copies/mL). A negative result must be combined with clinical observations, patient history, and epidemiological information. The expected result is Negative.  Fact Sheet for Patients:  PinkCheek.be  Fact Sheet for Healthcare Providers:  GravelBags.it  This test is no t yet approved or cleared by the [place] FDA and  has been authorized for detection and/or diagnosis of SARS-CoV-2 by FDA under an Emergency Use Authorization (EUA). This EUA will remain  in effect (meaning this test can be used) for the duration of the COVID-19 declaration under Section 564(b)(1) of the Act, 21 U.S.C. section 360bbb-3(b)(1), unless the authorization is terminated or revoked sooner.     Influenza A by PCR NEGATIVE NEGATIVE Final   Influenza B by PCR NEGATIVE NEGATIVE Final    Comment: (NOTE) The Xpert Xpress SARS-CoV-2/FLU/RSV assay is intended as an aid in  the diagnosis of influenza from Nasopharyngeal swab specimens and  should not be used as a sole basis for treatment. Nasal washings and  aspirates are unacceptable for Xpert Xpress SARS-CoV-2/FLU/RSV  testing.  Fact Sheet for Patients: PinkCheek.be  Fact Sheet for Healthcare Providers: GravelBags.it  This test is not yet approved or cleared by the  [place] FDA and  has been authorized for detection and/or diagnosis of SARS-CoV-2 by  FDA under an Emergency Use Authorization (EUA). This EUA will remain  in effect (meaning this test can be used) for the duration of the  Covid-19 declaration under Section 564(b)(1) of the Act, 21  U.S.C. section 360bbb-3(b)(1), unless the authorization is  terminated or revoked. Performed at [facility], [address], [place] [address]   Culture, Urine     Status: None   Collection Time: 11/24/19  8:35 AM   Specimen: Urine, Catheterized  Result Value Ref Range Status   Specimen Description   Final    URINE, CATHETERIZED Performed at [facility] [address], [place], [place] [address]    Special Requests   Final    URINE, RANDOM Performed at [facility] [address] [place], [place] [address]    Culture   Final    NO GROWTH Performed at [facility], [place] [address], [place], [place] [address]    Report Status 11/25/2019 FINAL  Final         Radiology Studies: DG CHEST PORT 1 VIEW  Result Date: 11/25/2019 CLINICAL DATA:  Shortness of breath, hypertension EXAM: PORTABLE CHEST 1 VIEW COMPARISON:  11/23/2019, 06/24/2018 FINDINGS: Single frontal view of the chest demonstrates a stable cardiac silhouette. 1.1 cm nodule overlying the right anterior fourth intercostal space corresponds to the right middle lobe nodule seen on recent CT. The areas of consolidation within the upper lung zones on CT are less apparent by chest x-ray. No other airspace disease, effusion, or pneumothorax. No acute bony abnormalities. IMPRESSION: 1. Right middle lobe nodule as above. Please refer to recent chest CT report describing recommendations for follow-up evaluation. 2. The areas of upper lobe consolidation and potential scarring seen on prior CT are not as apparent on chest x-ray. Electronically Signed   By: Randa Ngo M.D.    On: 11/25/2019 15:46        Scheduled Meds: . aspirin EC  81 mg Oral Daily  . Chlorhexidine Gluconate Cloth  6 each Topical Daily  . docusate sodium  100 mg Oral BID  . enoxaparin (LOVENOX) injection  30 mg Subcutaneous Q24H  . feeding supplement  237 mL Oral BID BM  . latanoprost  1 drop Both Eyes QHS  . lisinopril  10 mg Oral Daily  . loratadine  10 mg Oral q1800  . nutrition supplement (JUVEN)  1 packet Oral BID BM  . OLANZapine zydis  5 mg Oral BID  . simvastatin  20 mg Oral Daily  . vitamin B-12  1,000 mcg Oral Daily   Continuous Infusions: . dextrose 5 % and 0.9% NaCl Stopped (11/26/19 1132)     LOS: 4 days    Time spent: 35 minutes    Irine Seal, MD [facility]   To contact the attending provider between 7A-7P or the covering provider during after hours 7P-7A, please log into the web site [email or web address] and access using universal [place] password for that web site. If you do not have the password, please call the hospital operator.  11/26/2019, 12:24 PM

## 2019-11-26 NOTE — Anesthesia Postprocedure Evaluation (Signed)
Anesthesia Post Note  Patient: Jesus Harmon  Procedure(s) Performed: TOTAL HIP ARTHROPLASTY ANTERIOR APPROACH (Left Hip)     Patient location during evaluation: PACU Anesthesia Type: MAC and Spinal Level of consciousness: patient cooperative and lethargic Pain management: pain level controlled Vital Signs Assessment: post-procedure vital signs reviewed and stable Respiratory status: spontaneous breathing, nonlabored ventilation, respiratory function stable and patient connected to nasal cannula oxygen Cardiovascular status: stable and blood pressure returned to baseline Postop Assessment: no apparent nausea or vomiting and spinal receding Anesthetic complications: no   No complications documented.  Last Vitals:  Vitals:   11/26/19 1044 11/26/19 1404  BP: 129/65 (!) 132/58  Pulse: 87 84  Resp: 18 16  Temp: 36.9 C 36.6 C  SpO2: 96% 98%    Last Pain:  Vitals:   11/26/19 1404  TempSrc: Oral  PainSc:                  Kaily Wragg

## 2019-11-26 NOTE — Progress Notes (Signed)
Physical Therapy Treatment Patient Details Name: Jesus Harmon MRN: [id number] DOB: January 02, 1930 Today's Date: 11/26/2019    History of Present Illness Pt is 86 yo male with hx of CAD s/p stent, HF, dementia, HTN, and hyperlipidemia.  Pt had a fall several days ago with L hip pain.  He was seen at [facility] and found to have L hip fx.  Pt is now s/p anterior THA on 11/23/19.    PT Comments    Pt making significant improvements today.  He was less confused and able to follow commands.  Had assist of 2 for safety but could likely progress to assist of 1.  Pt required cues for transfer and gait technique with RW use.  Pt with good rehab potential.  Continue plan of care.    Follow Up Recommendations  CIR (family reports if not CIR then home w/ 24 hr care and max HHservices)     Equipment Recommendations  Rolling [place] with 5" wheels;3in1 (PT);Other (comment) (shower chair)    Recommendations for Other Services       Precautions / Restrictions Precautions Precautions: Fall Restrictions Weight Bearing Restrictions: Yes LLE Weight Bearing: Weight bearing as tolerated    Mobility  Bed Mobility Overal bed mobility: Needs Assistance Bed Mobility: Supine to Sit     Supine to sit: +2 for safety/equipment;Min assist     General bed mobility comments: Pt able to bring feet to EOB with min A and cues for one step instructions, assist for trunk elevation and use of pad to bring hips EOB. Pt reported being "lightheaded" at this time but BP WFL  Transfers Overall transfer level: Needs assistance Equipment used: Rolling [place] (2 wheeled) Transfers: Sit to/from Stand Sit to Stand: Mod assist;Min assist;+2 safety/equipment;From elevated surface         General transfer comment: initially mod A +2 for boost from bed with cues for safe hand placement. able to progress to min A of 1  Ambulation/Gait Ambulation/Gait assistance: Min assist;+2 safety/equipment Gait Distance (Feet):  100 Feet Assistive device: Rolling [place] (2 wheeled) Gait Pattern/deviations: Step-through pattern;Decreased stride length;Decreased stance time - left     General Gait Details: +2 for chair follow for safety (likely could progress past this next visit); Today, pt was able to hold handgrips on [place] today without cues but did require cues for RW proximity particularly with turns.   Stairs             Wheelchair Mobility    Modified Rankin (Stroke Patients Only)       Balance Overall balance assessment: Needs assistance Sitting-balance support: Single extremity supported;Feet supported;No upper extremity supported Sitting balance-Leahy Scale: Fair     Standing balance support: Bilateral upper extremity supported;No upper extremity supported Standing balance-Leahy Scale: Poor Standing balance comment: Required RW                            Cognition Arousal/Alertness: Awake/alert Behavior During Therapy: WFL for tasks assessed/performed Overall Cognitive Status: History of cognitive impairments - at baseline                                 General Comments: Daughter presents and shared that he is 180 degrees different and acting like himself; He was able to follow commands and was aware he was in hospital for a fractured hip.      Exercises  General Comments General comments (skin integrity, edema, etc.): VSS; Daughter present and provided chair follow. She reports if not CIR then wants home and not SNF.  Discussed home safety technique and recommendations for DME.      Pertinent Vitals/Pain Pain Assessment: Faces Faces Pain Scale: Hurts a little bit Pain Location: L thigh, rubbing it and potentially describing spasms Pain Descriptors / Indicators: Spasm;Discomfort Pain Intervention(s): Monitored during session;Repositioned    Home Living Family/patient expects to be discharged to:: Inpatient rehab Living Arrangements:  Spouse/significant other Available Help at Discharge: Family;Available 24 hours/day Type of Home: House Home Access: Stairs to enter Entrance Stairs-Rails: None Home Layout: One level Home Equipment: Environmental consultant - 2 wheels;Cane - single point Additional Comments: Family planning on getting hand held shower    Prior Function Level of Independence: Independent      Comments: Independent except does not drive due to dementia   PT Goals (current goals can now be found in the care plan section) Acute Rehab PT Goals Patient Stated Goal: get back to walking better PT Goal Formulation: With patient/family Time For Goal Achievement: 12/08/19 Potential to Achieve Goals: Good Progress towards PT goals: Progressing toward goals    Frequency    Min 5X/week      PT Plan Frequency needs to be updated    Co-evaluation PT/OT/SLP Co-Evaluation/Treatment: Yes Reason for Co-Treatment: For patient/therapist safety (pt requiring assist of 2 last visit) PT goals addressed during session: Mobility/safety with mobility OT goals addressed during session: ADL's and self-care      AM-PAC PT "6 Clicks" Mobility   Outcome Measure  Help needed turning from your back to your side while in a flat bed without using bedrails?: A Little Help needed moving from lying on your back to sitting on the side of a flat bed without using bedrails?: A Lot Help needed moving to and from a bed to a chair (including a wheelchair)?: A Lot Help needed standing up from a chair using your arms (e.g., wheelchair or bedside chair)?: A Lot Help needed to walk in hospital room?: A Little Help needed climbing 3-5 steps with a railing? : A Lot 6 Click Score: 14    End of Session Equipment Utilized During Treatment: Gait belt Activity Tolerance: Patient tolerated treatment well Patient left: with call bell/phone within reach;with family/visitor present;in chair;with chair alarm set Nurse Communication: Mobility status PT  Visit Diagnosis: Unsteadiness on feet (R26.81);Other abnormalities of gait and mobility (R26.89);Muscle weakness (generalized) (M62.81);History of falling (Z91.81)     Time: 0034-9179 PT Time Calculation (min) (ACUTE ONLY): 33 min  Charges:  $Gait Training: 8-22 mins                     Abran Richard, PT Acute Rehab Services Pager [phone] [facility] 11/26/2019, 1:00 PM

## 2019-11-26 NOTE — Evaluation (Signed)
Occupational Therapy Evaluation Patient Details Name: Jesus Harmon MRN: [id number] DOB: Jun 08, 1929 Today's Date: 11/26/2019    History of Present Illness Pt is 86 yo male with hx of CAD s/p stent, HF, dementia, HTN, and hyperlipidemia.  Pt had a fall several days ago with L hip pain.  He was seen at [facility] and found to have L hip fx.  Pt is now s/p anterior THA on 11/23/19.   Clinical Impression   Prior to admission, Pt was independent in home setting completing ADL and assisting with IADL (especially enjoys the outdoors). Pt does not drive due to dementia. Today Pt is pleasant, cooperative and eager to work with therapy. His daughter was present throughout. He was mod A +2 to come to the EOB - he demonstrated good one step direction following and needed the most assist for trunk elevation and use of pad to bring hips EOB. He was able to perform transfers from elevated bed with mod A +2 for initial boost and then hallway ambulation with min guard assist +2 chair follow. At this time Pt is at least mod A for LB ADL as he cannot reach below his knees for bathing/dressing. Verbally educated Pt and daughter on use of shower chair, long handle sponge, and potentially other AE (please plan on taking next session for demo) At this time, due to his high level of independence prior and motivation OT recommending CIR to maximize safety and independence in ADL and functional transfers. Second choice option due to dementia would be to maximize home health services including OT. Pt and daughter in agreement. OT will continue to follow acutely and will focus more on standing balance for ADL and potentially AE education.     Follow Up Recommendations  CIR    Equipment Recommendations  3 in 1 bedside commode;Other (comment) (potentially AE for LB ADL)    Recommendations for Other Services       Precautions / Restrictions Precautions Precautions: Fall Restrictions Weight Bearing Restrictions:  Yes LLE Weight Bearing: Weight bearing as tolerated      Mobility Bed Mobility Overal bed mobility: Needs Assistance Bed Mobility: Supine to Sit     Supine to sit: +2 for safety/equipment;Min assist     General bed mobility comments: Pt able to bring feet to EOB with min A and cues for one step instructions, assist for trunk elevation and use of pad to bring hips EOB. Pt reported being "lightheaded" at this time but BP WFL    Transfers Overall transfer level: Needs assistance Equipment used: Rolling [place] (2 wheeled) Transfers: Sit to/from Stand Sit to Stand: Mod assist;Min assist;+2 safety/equipment;From elevated surface         General transfer comment: initially mod A +2 for boost from bed with cues for safe hand placement. able to progress to min A of 1    Balance Overall balance assessment: Needs assistance Sitting-balance support: Single extremity supported;Feet supported Sitting balance-Leahy Scale: Fair     Standing balance support: Bilateral upper extremity supported;Single extremity supported;No upper extremity supported Standing balance-Leahy Scale: Poor Standing balance comment: dependent on BUE in standing                           ADL either performed or assessed with clinical judgement   ADL Overall ADL's : Needs assistance/impaired Eating/Feeding: Set up;Sitting   Grooming: Set up;Wash/dry face   Upper Body Bathing: Set up;Sitting   Lower Body Bathing: Moderate assistance;Sitting/lateral  leans Lower Body Bathing Details (indicate cue type and reason): assist for the knees down to feet verbally educated on long handle sponge Upper Body Dressing : Set up;Sitting   Lower Body Dressing: Moderate assistance;Sit to/from stand Lower Body Dressing Details (indicate cue type and reason): unable to perform figure 4 at this time Toilet Transfer: Minimal assistance;+2 for safety/equipment;Ambulation;RW;Moderate assistance Toilet Transfer Details  (indicate cue type and reason): initially mod to min +2 for boost to stand, then progressed to min A +2 safety Toileting- Clothing Manipulation and Hygiene: Moderate assistance       Functional mobility during ADLs: Moderate assistance;Minimal assistance;+2 for safety/equipment;Rolling Marcucci;Cueing for safety General ADL Comments: Initiated education about AE for LB ADL with Pt and daughter     Vision         Perception     Praxis      Pertinent Vitals/Pain Pain Assessment: Faces Faces Pain Scale: Hurts a little bit Pain Location: L thigh, rubbing it and potentially describing spasms Pain Descriptors / Indicators: Spasm;Discomfort Pain Intervention(s): Monitored during session;Repositioned     Hand Dominance     Extremity/Trunk Assessment Upper Extremity Assessment Upper Extremity Assessment: Overall WFL for tasks assessed   Lower Extremity Assessment Lower Extremity Assessment: Defer to PT evaluation;LLE deficits/detail LLE Deficits / Details: deficits as anticipated post-op LLE Coordination: decreased gross motor   Cervical / Trunk Assessment Cervical / Trunk Assessment: Normal   Communication Communication Communication: No difficulties   Cognition Arousal/Alertness: Awake/alert Behavior During Therapy: WFL for tasks assessed/performed Overall Cognitive Status: History of cognitive impairments - at baseline                                 General Comments: Daughter presents and shared that he is 180 degrees different and acting like himself   General Comments  Daughter present throughout session - [place] and very helpful with chair follow    Exercises     Shoulder Instructions      Home Living Family/patient expects to be discharged to:: Inpatient rehab Living Arrangements: Spouse/significant other Available Help at Discharge: Family;Available 24 hours/day Type of Home: House Home Access: Stairs to enter [employer] of  Steps: 1 Entrance Stairs-Rails: None Home Layout: One level     Bathroom Shower/Tub: Occupational psychologist: Standard Bathroom Accessibility: Yes How Accessible: Accessible via [place] Home Equipment: [place] - 2 wheels;Cane - single point   Additional Comments: Family planning on getting hand held shower      Prior Functioning/Environment Level of Independence: Independent        Comments: Independent except does not drive due to dementia        OT Problem List: Decreased range of motion;Decreased activity tolerance;Impaired balance (sitting and/or standing);Decreased knowledge of use of DME or AE;Pain      OT Treatment/Interventions: Self-care/ADL training;DME and/or AE instruction;Therapeutic activities;Patient/family education;Balance training    OT Goals(Current goals can be found in the care plan section) Acute Rehab OT Goals Patient Stated Goal: get back to walking better OT Goal Formulation: With patient/family Time For Goal Achievement: 12/10/19 Potential to Achieve Goals: Good ADL Goals Pt Will Perform Grooming: with supervision;standing Pt Will Perform Upper Body Dressing: with modified independence;sitting Pt Will Perform Lower Body Dressing: with min assist;with caregiver independent in assisting;with adaptive equipment;sit to/from stand Pt Will Transfer to Toilet: with supervision;ambulating Pt Will Perform Toileting - Clothing Manipulation and hygiene: with modified independence;sitting/lateral  leans Additional ADL Goal #1: Pt will perform bed mobility at supervision level prior to engaging in ADL  OT Frequency: Min 2X/week   Barriers to D/C:    excellent family support       Co-evaluation              AM-PAC OT "6 Clicks" Daily Activity     Outcome Measure Help from another person eating meals?: None Help from another person taking care of personal grooming?: A Little Help from another person toileting, which includes using toliet,  bedpan, or urinal?: A Lot Help from another person bathing (including washing, rinsing, drying)?: A Lot Help from another person to put on and taking off regular upper body clothing?: A Little Help from another person to put on and taking off regular lower body clothing?: A Lot 6 Click Score: 16   End of Session Equipment Utilized During Treatment: Gait belt;Rolling Goettl Nurse Communication: Mobility status;Other (comment) (concern over infiltrated IV line (weeping edemous skin))  Activity Tolerance: Patient tolerated treatment well Patient left: in chair;with call bell/phone within reach;with chair alarm set;with family/visitor present  OT Visit Diagnosis: Unsteadiness on feet (R26.81);Other abnormalities of gait and mobility (R26.89);History of falling (Z91.81);Pain Pain - Right/Left: Left Pain - part of body: Hip                Time: 8022-1798 OT Time Calculation (min): 33 min Charges:  OT General Charges $OT Visit: 1 Visit OT Evaluation $OT Eval Moderate Complexity: Muir OTR/L Acute Rehabilitation Services Pager: [phone] Office: [place] 11/26/2019, 11:53 AM

## 2019-11-26 NOTE — Progress Notes (Signed)
Patient out of bed between bed and wall.  Levada Dy, RN to re enter IV team consult for restart once patient is back in bed.

## 2019-11-26 NOTE — Care Management Important Message (Signed)
Important Message  Patient Details IM Letter given to the Patient. Name: Jesus Harmon MRN: [id number] Date of Birth: 1929/12/25   Medicare Important Message Given:  Yes     Kerin Salen 11/26/2019, 11:34 AM

## 2019-11-26 NOTE — Progress Notes (Signed)
° ° °  Subjective:  Patient reports pain as mild to moderate.  Denies N/V/CP/SOB. Patient alert in bed. Complains of occasional muscle spasms in the left leg  Objective:   VITALS:   Vitals:   11/25/19 1800 11/25/19 2100 11/26/19 0259 11/26/19 0529  BP: 128/64 (!) 126/59 (!) 113/58 (!) 125/53  Pulse: 100 83 78   Resp: (!) 23 (!) 22 (!) 23 (!) 22  Temp: 98.3 F (36.8 C) 99 F (37.2 C) 99.5 F (37.5 C) 99.3 F (37.4 C)  TempSrc: Oral Axillary Oral Oral  SpO2: 94% 99% 93% 96%  Weight:      Height:        NAD ABD soft Neurovascular intact Sensation intact distally Intact pulses distally Dorsiflexion/Plantar flexion intact Incision: dressing C/D/I   Lab Results  Component Value Date   WBC 9.9 11/26/2019   HGB 8.7 (L) 11/26/2019   HCT 28.3 (L) 11/26/2019   MCV 92.8 11/26/2019   PLT 296 11/26/2019   BMET    Component Value Date/Time   NA 140 11/26/2019 0440   NA 141 02/21/2018 1614   K 3.3 (L) 11/26/2019 0440   CL 108 11/26/2019 0440   CO2 26 11/26/2019 0440   GLUCOSE 121 (H) 11/26/2019 0440   BUN 21 11/26/2019 0440   BUN 17 02/21/2018 1614   CREATININE 0.81 11/26/2019 0440   CALCIUM 8.5 (L) 11/26/2019 0440   GFRNONAA >60 11/26/2019 0440   GFRAA >60 10/05/2019 2979     Assessment/Plan: 3 Days Post-Op   Principal Problem:   Hip fracture (HCC) Active Problems:   CAD, NATIVE VESSEL  (06/16/2016): a. sev sten of mid right coronary art PTCA and DES , severe prox LAD stenosis unsucc PTCA, widely patent left main and left circ   Hyperlipidemia   Essential hypertension   CAD (coronary artery disease)   B12 deficiency   Anemia   Pulmonary nodule, right   Dementia with behavioral disturbance (HCC)   Closed displaced fracture of left femoral neck (HCC)   WBAT with [place] DVT ppx: Aspirin, SCDs, TEDS PO pain control PT/OT Dispo: D/C home w/ HHPT vs CIR, depending on insurance and bed availability    Dorothyann Peng 11/26/2019, 8:26 AM    [facility] is now [facility] [employer] [address] [place], [place], [place] [address] Phone: [phone] [email or web address]   [employer]

## 2019-11-27 DIAGNOSIS — D649 Anemia, unspecified: Secondary | ICD-10-CM | POA: Diagnosis not present

## 2019-11-27 DIAGNOSIS — S72002A Fracture of unspecified part of neck of left femur, initial encounter for closed fracture: Secondary | ICD-10-CM | POA: Diagnosis not present

## 2019-11-27 DIAGNOSIS — I2510 Atherosclerotic heart disease of native coronary artery without angina pectoris: Secondary | ICD-10-CM | POA: Diagnosis not present

## 2019-11-27 DIAGNOSIS — E538 Deficiency of other specified B group vitamins: Secondary | ICD-10-CM | POA: Diagnosis not present

## 2019-11-27 LAB — BASIC METABOLIC PANEL
Anion gap: 6 (ref 5–15)
BUN: 25 mg/dL — ABNORMAL HIGH (ref 8–23)
CO2: 26 mmol/L (ref 22–32)
Calcium: 8.7 mg/dL — ABNORMAL LOW (ref 8.9–10.3)
Chloride: 107 mmol/L (ref 98–111)
Creatinine, Ser: 0.82 mg/dL (ref 0.61–1.24)
GFR, Estimated: >60 mL/min (ref >60)
Glucose, Bld: 119 mg/dL — ABNORMAL HIGH (ref 70–99)
Potassium: 4.2 mmol/L (ref 3.5–5.1)
Sodium: 139 mmol/L (ref 135–145)

## 2019-11-27 LAB — HEMOGLOBIN AND HEMATOCRIT, BLOOD
HCT: 26.9 % — ABNORMAL LOW (ref 39.0–52.0)
Hemoglobin: 8.4 g/dL — ABNORMAL LOW (ref 13.0–17.0)

## 2019-11-27 MED ORDER — PROCHLORPERAZINE EDISYLATE 10 MG/2ML IJ SOLN: 10.0000 mg | Freq: Four times a day (QID) | INTRAMUSCULAR | Status: DC | PRN

## 2019-11-27 MED ORDER — DOCUSATE SODIUM 100 MG PO CAPS: 100.0000 mg | ORAL_CAPSULE | Freq: Two times a day (BID) | ORAL | 0 refills | Status: AC

## 2019-11-27 MED ORDER — OLANZAPINE 5 MG PO TBDP
5.0000 mg | ORAL_TABLET | Freq: Every evening | ORAL | 0 refills | Status: DC | PRN
Start: 2019-11-27 — End: 2020-03-19

## 2019-11-27 MED ORDER — POLYETHYLENE GLYCOL 3350 17 G PO PACK: 17.0000 g | PACK | Freq: Every day | ORAL | 0 refills | Status: AC | PRN

## 2019-11-27 NOTE — Discharge Summary (Signed)
Physician Discharge Summary  Jesus Harmon OZH:[id number] DOB: 02-Sep-1929 DOA: 11/22/2019  PCP: Alroy Dust, L.Marlou Sa, MD  Admit date: 11/22/2019 Discharge date: 11/27/2019  Time spent: 50 minutes  Recommendations for Outpatient Follow-up:  1. Follow-up with Dr. Lyla Glassing, orthopedics in 2 weeks. Follow-up with Alroy Dust, L.Marlou Sa, MD in 3 weeks.  Patient will need a CBC done to follow-up on H&H.  Patient will need a basic metabolic profile done to follow-up on electrolytes and renal function.  Patient's dementia will need to be reassessed.  Patient will need referral to pulmonary for evaluation of pulmonary nodule.  Patient will need repeat CT chest in 3 months for follow-up on pulmonary nodule.  Discharge Diagnoses:  Principal Problem:   Hip fracture ([place]) Active Problems:   CAD, NATIVE VESSEL  (06/16/2016): a. sev sten of mid right coronary art PTCA and DES , severe prox LAD stenosis unsucc PTCA, widely patent left main and left circ   Hyperlipidemia   Essential hypertension   CAD (coronary artery disease)   B12 deficiency   Anemia   Pulmonary nodule, right   Dementia with behavioral disturbance (HCC)   Closed displaced fracture of left femoral neck (HCC)   Discharge Condition: Stable and improved  Diet recommendation: Heart healthy  Filed Weights   11/23/19 1534  Weight: 61.2 kg    History of present illness:  HPI per Dr. Neysa Bonito This is an 86 year old male with a history of CAD s/p stent, HFpEF, vascular dementia, Hypertension, hyperlipidemia who was sent in from [facility] office for left hip fracture. Patient had a fall onto his left hip several days ago resulting in pain most notably with ambulation. [place] due to poor balance on uneven ground outside. He has fallen multiple times over the past several weeks for similar reasons without LOC and had multiple falls this week. Most recent fall was last night when he was sitting in his chair and leaning over to pick something up off the  ground when he lost his balance and struck the back of his head on the wall. No LOC, dizziness, SOB, numbness/weakness or other potential causes to his falls. Struck his right cheek on the ground and suffered a small bruise several weeks ago. His falls prompted him to go to the orthopedic urgent care today with his daughter where he was found to have a left hip fracture by Xray and was sent to [facility] by ortho. Denies any chest pain, palpitations, nausea, vomiting or any other complaints.   ED Course: afebrile, hemodynamically stable on room air. Labs overall unremarkable. CT head with chronic ischemic white matter disease and other chronic issues but no acute findings. CT Cervical spine with severe multilevel DDD and 11x6 mm irregular nodule seen in RUL medially with pleural tail concerning for possible neoplasm.   Hospital Course:  1 left femoral neck fracture status post multiple falls Secondary to mechanical fall.  Patient seen in outpatient orthopedic clinic and sent to the ED.  Patient assessed by orthopedics and patient status post left total hip arthroplasty per Dr. Lyla Glassing 11/23/2019 without any complications.  Aspirin was held preoperatively and resume postoperatively.  Patient maintained on aspirin for DVT prophylaxis.  Patient was seen by PT OT who recommended CIR.  Patient was assessed by CIR however unfortunately insurance denied admission to CIR and as such patient will be discharged home with home health therapies.  Outpatient follow-up with orthopedics.   2.  Hypertension Controlled on home regimen of lisinopril.    3.  Coronary artery disease status post DES in 2018 Status post cardiac cath 06/16/2016: Severe stenosis of mid RCA status post PTCA and DES, severe proximal LAD stenosis with unsuccessful angioplasty.  Aspirin held and resumed per orthopedics postoperatively.  Patient was maintained on home regimen lisinopril and statin.Was seen by cardiology for preop clearance on admission.   Outpatient follow-up with cardiology.    4.  Hyperlipidemia Patient maintained on home regimen statin.    5.  Right Pulmonary nodule,  concerning for neoplasm Noted on CT C-spine.  CT chest with areas of parenchymal lung scarring and atelectasis may be due to inflammation, surveillance is warranted.  1.1 x 1.1 x 0.9 cm right middle lobe nodule opacity noted potentially worrisome for small neoplastic focus.  Will need repeat CT chest or follow-up PET/CT or tissue sampling in 3 months.  Outpatient follow-up with pulmonary and PCP.  6.  Vitamin B12 deficiency Patient maintained on Vitamin B12 supplementation.   7.  Anemia of chronic disease/postop acute blood loss anemia Likely secondary to vitamin B12 deficiency in addition to postop acute blood loss anemia as well as dilutional effect..  Hemoglobin stabilized at 8.4 by day of discharge.  Vitamin B12 levels noted at 794.  Patient was also maintained on vitamin B12 supplementation.  Outpatient follow-up with PCP.   8.  Dementia with behavioral disturbance Patient with a history of underlying vascular dementia.  Patient noted to be agitated and aggressive postoperatively early on during the hospitalization felt secondary to sundowning.  Patient was placed on Zyprexa 5 mg twice daily as well as IV Ativan as needed.  Patient noted to be extremely sedated on IV Ativan and was subsequently discontinued.  Patient improved clinically and was close to baseline by day of discharge.  Patient be discharged home on Zyprexa 5 mg nightly as needed agitation.  Outpatient follow-up with PCP.    9.  Hypokalemia Repleted.    Procedures:  CT head CT C-spine 11/22/2019   Left total hip arthroplasty per Dr. Lyla Glassing 11/23/2019  Consultations:  Orthopedics: Dr. Wynelle Link 11/22/2019  Cardiology: Dr. Harrell Gave 11/22/2019   Discharge Exam: Vitals:   11/27/19 1004 11/27/19 1334  BP:  128/80  Pulse: 100 (!) 108  Resp:  14  Temp:  98.9 F (37.2  C)  SpO2: 97% 95%    General: NAD Cardiovascular: RRR Respiratory: CTAB  Discharge Instructions   Discharge Instructions    Diet - low sodium heart healthy   Complete by: As directed    Discharge wound care:   Complete by: As directed    As above   Increase activity slowly   Complete by: As directed      Allergies as of 11/27/2019      Reactions   Amoxicillin Swelling, Rash, Other (See Comments)   Swelling and rash to face Has patient had a PCN reaction causing immediate rash, facial/tongue/throat swelling, SOB or lightheadedness with hypotension: Yes Has patient had a PCN reaction causing severe rash involving mucus membranes or skin necrosis: No Has patient had a PCN reaction that required hospitalization: No Has patient had a PCN reaction occurring within the last 10 years: Yes If all of the above answers are "NO", then may proceed with Cephalosporin use.   Sulfonamide Derivatives Hives   Sulfamethoxazole Hives, Other (See Comments)      Medication List    STOP taking these medications   diclofenac 75 MG EC tablet Commonly known as: VOLTAREN   memantine 10 MG tablet Commonly known as: Namenda  TAKE these medications   acetaminophen 500 MG tablet Commonly known as: TYLENOL Take 1,000 mg by mouth every 6 (six) hours as needed for moderate pain.   aspirin 81 MG chewable tablet Commonly known as: Aspirin Childrens Chew 1 tablet (81 mg total) by mouth daily. What changed: You were already taking a medication with the same name, and this prescription was added. Make sure you understand how and when to take each.   aspirin EC 81 MG tablet Take 1 tablet (81 mg total) by mouth 2 (two) times daily with a meal. What changed: when to take this   docusate sodium 100 MG capsule Commonly known as: COLACE Take 1 capsule (100 mg total) by mouth 2 (two) times daily.   HYDROcodone-acetaminophen 5-325 MG tablet Commonly known as: NORCO/VICODIN Take 1-2 tablets by  mouth every 6 (six) hours as needed for moderate pain.   latanoprost 0.005 % ophthalmic solution Commonly known as: XALATAN Place 1 drop into both eyes at bedtime.   levocetirizine 5 MG tablet Commonly known as: XYZAL Take 5 mg by mouth every evening.   lisinopril 10 MG tablet Commonly known as: ZESTRIL TAKE 1 TABLET BY MOUTH DAILY.   nitroGLYCERIN 0.4 MG SL tablet Commonly known as: NITROSTAT PLACE 1 TABLET UNDER THE TONGUE EVERY 5 MINUTES AS NEEDED FOR CHEST PAIN. What changed: See the new instructions.   OLANZapine zydis 5 MG disintegrating tablet Commonly known as: ZYPREXA Take 1 tablet (5 mg total) by mouth at bedtime as needed (agitation).   polyethylene glycol 17 g packet Commonly known as: MIRALAX / GLYCOLAX Take 17 g by mouth daily as needed for mild constipation.   simvastatin 20 MG tablet Commonly known as: ZOCOR TAKE 1 TABLET BY MOUTH DAILY. What changed: when to take this   tiZANidine 2 MG tablet Commonly known as: ZANAFLEX Take 2 mg by mouth daily as needed for muscle spasms.   vitamin B-12 1000 MCG tablet Commonly known as: CYANOCOBALAMIN Take 1,000 mcg by mouth daily.            Durable Medical Equipment  (From admission, onward)         Start     Ordered   11/26/19 0850  For home use only DME 4 wheeled rolling Kisner with seat  Once       Question:  Patient needs a [place] to treat with the following condition  Answer:  Hip fracture ([place])   11/26/19 0849   11/26/19 0850  For home use only DME 3 n 1  Once        11/26/19 0849           Discharge Care Instructions  (From admission, onward)         Start     Ordered   11/27/19 0000  Discharge wound care:       Comments: As above   11/27/19 1504         Allergies  Allergen Reactions  . Amoxicillin Swelling, Rash and Other (See Comments)    Swelling and rash to face Has patient had a PCN reaction causing immediate rash, facial/tongue/throat swelling, SOB or lightheadedness with  hypotension: Yes Has patient had a PCN reaction causing severe rash involving mucus membranes or skin necrosis: No Has patient had a PCN reaction that required hospitalization: No Has patient had a PCN reaction occurring within the last 10 years: Yes If all of the above answers are "NO", then may proceed with Cephalosporin use.    Marland Kitchen  Sulfonamide Derivatives Hives  . Sulfamethoxazole Hives and Other (See Comments)    Follow-up Information    Swinteck, Aaron Edelman, MD. Schedule an appointment as soon as possible for a visit in 2 weeks.   Specialty: Orthopedic Surgery Why: For wound re-check Contact information: [address] [place] [address] [phone]        Alroy Dust, L.Marlou Sa, MD. Schedule an appointment as soon as possible for a visit in 3 week(s).   Specialty: Family Medicine Contact information: [address] [place] [address] [phone]        Sherren Mocha, MD .   Specialty: Cardiology Contact information: [phone] [place] [address] [phone]                The results of significant diagnostics from this hospitalization (including imaging, microbiology, ancillary and laboratory) are listed below for reference.    Significant Diagnostic Studies: CT Head Wo Contrast  Result Date: 11/22/2019 CLINICAL DATA:  Head injury and neck pain after fall. EXAM: CT HEAD WITHOUT CONTRAST CT CERVICAL SPINE WITHOUT CONTRAST TECHNIQUE: Multidetector CT imaging of the head and cervical spine was performed following the standard protocol without intravenous contrast. Multiplanar CT image reconstructions of the cervical spine were also generated. COMPARISON:  September 05, 2013. FINDINGS: CT HEAD FINDINGS Brain: Mild chronic ischemic white matter disease is noted. Mild diffuse cortical atrophy is noted. No mass effect or midline shift is noted. Ventricular size is within normal limits. There is no evidence of mass  lesion, hemorrhage or acute infarction. Vascular: No hyperdense vessel or unexpected calcification. Skull: Normal. Negative for fracture or focal lesion. Sinuses/Orbits: No acute finding. Other: None. CT CERVICAL SPINE FINDINGS Alignment: Minimal grade 1 retrolisthesis of C5-6 is noted secondary to severe degenerative disc disease at this level. Skull base and vertebrae: No acute fracture. No primary bone lesion or focal pathologic process. Soft tissues and spinal canal: No prevertebral fluid or swelling. No visible canal hematoma. Disc levels: Severe degenerative disc disease is noted at C4-5, C5-6, C6-7 and C7-T1. Upper chest: 11 x 6 mm irregular nodule is seen medially in right upper lobe with pleural tail concerning for possible neoplasm. Other: None. IMPRESSION: 1. Mild chronic ischemic white matter disease. Mild diffuse cortical atrophy. No acute intracranial abnormality seen. 2. Severe multilevel degenerative disc disease. No acute abnormality seen in the cervical spine. 3. 11 x 6 mm irregular nodule is seen medially in right upper lobe with pleural tail concerning for possible neoplasm. CT scan of the chest is recommended for further evaluation. Electronically Signed   By: Marijo Conception M.D.   On: 11/22/2019 13:22   CT CHEST WO CONTRAST  Result Date: 11/23/2019 CLINICAL DATA:  Nodular opacity seen right upper lobe on recent cervical spine CT examination EXAM: CT CHEST WITHOUT CONTRAST TECHNIQUE: Multidetector CT imaging of the chest was performed following the standard protocol without IV contrast. COMPARISON:  Cervical spine CT November 22, 2019. Chest radiograph June 24, 2018 FINDINGS: Cardiovascular: There is no appreciable thoracic aortic aneurysm. There is mild calcification at the origin of the left subclavian artery. Other visualized great vessels appear unremarkable on this noncontrast enhanced study. There are foci of aortic atherosclerosis. There are multiple foci of coronary artery  calcification. There is no pericardial effusion or pericardial thickening. Mediastinum/Nodes: The thyroid is not enlarged. There are nodular opacities in the left lobe, none of which measure larger than 1 cm in size. No appreciable thoracic adenopathy noted.  Several subcentimeter lymph nodes noted. No esophageal lesions are evident. Lungs/Pleura: There is localized scarring and atelectasis in the right upper lobe anteriorly and medially at the site noted on recent CT. There is a nodular appearing opacity within this area measuring 8 x 6 mm, best seen on axial slice 36 series 5. There is a bullous area with scarring in the posterior segment of the left upper lobe measuring 3.1 x 2.0 cm. There is an area of scarring with focal volume loss more inferiorly in the anterior segment of the left upper lobe which is felt to primarily be due to inflammatory change. There is an area that appears somewhat nodular within this area of scarring measuring 0.9 x 0.8 cm, best seen on axial slice 57 series 5. There is a focal nodular opacity in the right middle lobe lateral segment measuring 1.1 x 1.1 x 0.9 cm which is well defined. There is atelectatic change in the medial left base. No pleural effusions. There is mild lower lobe bronchiectatic change. No edema or consolidation evident. Upper Abdomen: There is upper abdominal aortic atherosclerosis. Visualized upper abdominal structures otherwise appear normal. Musculoskeletal: There is degenerative change in the thoracic spine. Degenerative changes noted in the left shoulder. No blastic or lytic bone lesions are evident. No chest wall lesions are appreciable. IMPRESSION: 1. Areas of parenchymal lung scarring and atelectasis. Somewhat nodular areas noted within areas of apparent scarring and atelectasis of uncertain etiology. These areas may entirely be due to inflammation; surveillance of these areas is warranted given the overall appearance, however. 2. There is a well-defined  nodular opacity in the right middle lobe lateral segment measuring 1.1 x 1.1 x 0.9 cm which is potentially more worrisome for a small neoplastic focus. Consider one of the following in 3 months for both low-risk and high-risk individuals: (a) repeat chest CT, (b) follow-up PET-CT, or (c) tissue sampling. This recommendation follows the consensus statement: Guidelines for Management of Incidental Pulmonary Nodules Detected on CT Images: From the Fleischner Society 2017; Radiology 2017; 284:228-243. Given multiple nodular appearing areas, nuclear medicine PET study at this time would be a reasonable consideration. 3. No edema or airspace opacity. There is mild lower lobe bronchiectatic change bilaterally. 4.  No evident adenopathy. 5. Nodular opacities in the left lobe of the thyroid without enlargement. In the setting of significant comorbidities or limited life expectancy, no follow-up recommended beyond particular attention to this area on repeat CT imaging. (Ref: J Am Coll Radiol. 2015 Feb;12(2): 143-50). 6. Aortic atherosclerosis. Foci of coronary artery calcification noted. Aortic Atherosclerosis (ICD10-I70.0). Electronically Signed   By: Lowella Grip III M.D.   On: 11/23/2019 11:05   CT Cervical Spine Wo Contrast  Result Date: 11/22/2019 CLINICAL DATA:  Head injury and neck pain after fall. EXAM: CT HEAD WITHOUT CONTRAST CT CERVICAL SPINE WITHOUT CONTRAST TECHNIQUE: Multidetector CT imaging of the head and cervical spine was performed following the standard protocol without intravenous contrast. Multiplanar CT image reconstructions of the cervical spine were also generated. COMPARISON:  September 05, 2013. FINDINGS: CT HEAD FINDINGS Brain: Mild chronic ischemic white matter disease is noted. Mild diffuse cortical atrophy is noted. No mass effect or midline shift is noted. Ventricular size is within normal limits. There is no evidence of mass lesion, hemorrhage or acute infarction. Vascular: No hyperdense  vessel or unexpected calcification. Skull: Normal. Negative for fracture or focal lesion. Sinuses/Orbits: No acute finding. Other: None. CT CERVICAL SPINE FINDINGS Alignment: Minimal grade 1 retrolisthesis of C5-6 is noted  secondary to severe degenerative disc disease at this level. Skull base and vertebrae: No acute fracture. No primary bone lesion or focal pathologic process. Soft tissues and spinal canal: No prevertebral fluid or swelling. No visible canal hematoma. Disc levels: Severe degenerative disc disease is noted at C4-5, C5-6, C6-7 and C7-T1. Upper chest: 11 x 6 mm irregular nodule is seen medially in right upper lobe with pleural tail concerning for possible neoplasm. Other: None. IMPRESSION: 1. Mild chronic ischemic white matter disease. Mild diffuse cortical atrophy. No acute intracranial abnormality seen. 2. Severe multilevel degenerative disc disease. No acute abnormality seen in the cervical spine. 3. 11 x 6 mm irregular nodule is seen medially in right upper lobe with pleural tail concerning for possible neoplasm. CT scan of the chest is recommended for further evaluation. Electronically Signed   By: Marijo Conception M.D.   On: 11/22/2019 13:22   Pelvis Portable  Result Date: 11/23/2019 CLINICAL DATA:  Postop left hip replacement EXAM: PORTABLE PELVIS 1-2 VIEWS COMPARISON:  None. FINDINGS: Mild to moderate arthritis of the right hip. Pubic symphysis and rami appear intact. Status post left hip replacement with intact hardware and normal alignment. No fracture. Gas in the soft tissues consistent with recent surgery IMPRESSION: Status post left hip replacement with expected postsurgical changes. Electronically Signed   By: Donavan Foil M.D.   On: 11/23/2019 20:24   DG CHEST PORT 1 VIEW  Result Date: 11/25/2019 CLINICAL DATA:  Shortness of breath, hypertension EXAM: PORTABLE CHEST 1 VIEW COMPARISON:  11/23/2019, 06/24/2018 FINDINGS: Single frontal view of the chest demonstrates a stable  cardiac silhouette. 1.1 cm nodule overlying the right anterior fourth intercostal space corresponds to the right middle lobe nodule seen on recent CT. The areas of consolidation within the upper lung zones on CT are less apparent by chest x-ray. No other airspace disease, effusion, or pneumothorax. No acute bony abnormalities. IMPRESSION: 1. Right middle lobe nodule as above. Please refer to recent chest CT report describing recommendations for follow-up evaluation. 2. The areas of upper lobe consolidation and potential scarring seen on prior CT are not as apparent on chest x-ray. Electronically Signed   By: Randa Ngo M.D.   On: 11/25/2019 15:46   DG C-Arm 1-60 Min-No Report  Result Date: 11/23/2019 Fluoroscopy was utilized by the requesting physician.  No radiographic interpretation.   DG HIP OPERATIVE UNILAT W OR W/O PELVIS LEFT  Result Date: 11/23/2019 CLINICAL DATA:  Left hip arthroplasty EXAM: OPERATIVE left HIP (WITH PELVIS IF PERFORMED) 2 VIEWS TECHNIQUE: Fluoroscopic spot image(s) were submitted for interpretation post-operatively. COMPARISON:  None FINDINGS: Two low resolution intraoperative spot views of the left hip. Total fluoroscopy time was 8 seconds. The images demonstrate a left hip replacement with normal alignment IMPRESSION: Intraoperative fluoroscopic assistance provided during left hip replacement. Electronically Signed   By: Donavan Foil M.D.   On: 11/23/2019 20:23    Microbiology: Recent Results (from the past 240 hour(s))  Respiratory Panel by RT PCR (Flu A&B, Covid) - Nasopharyngeal Swab     Status: None   Collection Time: 11/22/19 12:13 PM   Specimen: Nasopharyngeal Swab  Result Value Ref Range Status   SARS Coronavirus 2 by RT PCR NEGATIVE NEGATIVE Final    Comment: (NOTE) SARS-CoV-2 target nucleic acids are NOT DETECTED.  The SARS-CoV-2 RNA is generally detectable in upper respiratoy specimens during the acute phase of infection. The lowest concentration of  SARS-CoV-2 viral copies this assay can detect is 131 copies/mL. A negative  result does not preclude SARS-Cov-2 infection and should not be used as the sole basis for treatment or other patient management decisions. A negative result may occur with  improper specimen collection/handling, submission of specimen other than nasopharyngeal swab, presence of viral mutation(s) within the areas targeted by this assay, and inadequate number of viral copies (<131 copies/mL). A negative result must be combined with clinical observations, patient history, and epidemiological information. The expected result is Negative.  Fact Sheet for Patients:  PinkCheek.be  Fact Sheet for Healthcare Providers:  GravelBags.it  This test is no t yet approved or cleared by the [place] FDA and  has been authorized for detection and/or diagnosis of SARS-CoV-2 by FDA under an Emergency Use Authorization (EUA). This EUA will remain  in effect (meaning this test can be used) for the duration of the COVID-19 declaration under Section 564(b)(1) of the Act, 21 U.S.C. section 360bbb-3(b)(1), unless the authorization is terminated or revoked sooner.     Influenza A by PCR NEGATIVE NEGATIVE Final   Influenza B by PCR NEGATIVE NEGATIVE Final    Comment: (NOTE) The Xpert Xpress SARS-CoV-2/FLU/RSV assay is intended as an aid in  the diagnosis of influenza from Nasopharyngeal swab specimens and  should not be used as a sole basis for treatment. Nasal washings and  aspirates are unacceptable for Xpert Xpress SARS-CoV-2/FLU/RSV  testing.  Fact Sheet for Patients: PinkCheek.be  Fact Sheet for Healthcare Providers: GravelBags.it  This test is not yet approved or cleared by the [place] FDA and  has been authorized for detection and/or diagnosis of SARS-CoV-2 by  FDA under an Emergency Use  Authorization (EUA). This EUA will remain  in effect (meaning this test can be used) for the duration of the  Covid-19 declaration under Section 564(b)(1) of the Act, 21  U.S.C. section 360bbb-3(b)(1), unless the authorization is  terminated or revoked. Performed at [facility], [place] [address], [place], [place] [address]   Culture, Urine     Status: None   Collection Time: 11/24/19  8:35 AM   Specimen: Urine, Catheterized  Result Value Ref Range Status   Specimen Description   Final    URINE, CATHETERIZED Performed at [facility] [address], [place], [place] [address]    Special Requests   Final    URINE, RANDOM Performed at [facility] [address] [place], [place] [address]    Culture   Final    NO GROWTH Performed at [facility], [place] [address] [place], [place] [address]    Report Status 11/25/2019 FINAL  Final     Labs: Basic Metabolic Panel: Recent Labs  Lab 11/23/19 0819 11/24/19 0554 11/25/19 0533 11/26/19 0440 11/27/19 0443  NA 138 136 140 140 139  K 4.1 4.2 4.0 3.3* 4.2  CL 104 102 106 108 107  CO2 24 24 25 26 26   GLUCOSE 102* 113* 98 121* 119*  BUN 19 25* 19 21 25*  CREATININE 0.84 1.12 0.83 0.81 0.82  CALCIUM 8.9 9.0 8.8* 8.5* 8.7*  MG 2.0  --   --   --   --    Liver Function Tests: No results for input(s): AST, ALT, ALKPHOS, BILITOT, PROT, ALBUMIN in the last 168 hours. No results for input(s): LIPASE, AMYLASE in the last 168 hours. No results for input(s): AMMONIA in the last 168 hours. CBC: Recent Labs  Lab 11/22/19 1212 11/22/19 1212 11/23/19 2956 11/24/19 0554 11/25/19 0533 11/26/19 0440  11/27/19 0443  WBC 8.4  --  7.9 12.6* 8.8 9.9  --   NEUTROABS 6.2  --  6.3 10.7*  --   --   --   HGB 9.6*   < > 9.3* 9.3* 9.6* 8.7* 8.4*  HCT 31.4*   < > 29.9* 30.2* 31.2* 28.3* 26.9*  MCV 93.5  --  93.4 93.2 92.6 92.8  --   PLT 357  --  357 358 326 296  --    < > = values in  this interval not displayed.   Cardiac Enzymes: No results for input(s): CKTOTAL, CKMB, CKMBINDEX, TROPONINI in the last 168 hours. BNP: BNP (last 3 results) No results for input(s): BNP in the last 8760 hours.  ProBNP (last 3 results) No results for input(s): PROBNP in the last 8760 hours.  CBG: No results for input(s): GLUCAP in the last 168 hours.     Signed:  Irine Seal MD.  [facility] 11/27/2019, 3:14 PM

## 2019-11-27 NOTE — TOC Progression Note (Signed)
Transition of Care [facility]) - Progression Note    Patient Details  Name: Jesus Harmon MRN: [id number] Date of Birth: [birth year]  Transition of Care [facility]) CM/SW Contact  Purcell Mouton, RN Phone Number: 11/27/2019, 4:05 PM  Clinical Narrative:    This CM noted pt was lethargic while in pt's room. Spoke with MD and bedside RN Concerning this situation. Pt will discharge in the AM.    Expected Discharge Plan: [place] Barriers to Discharge: Continued Medical Work up  Expected Discharge Plan and Services Expected Discharge Plan: [employer] In-house Referral: Clinical Social Work   Post Acute Care Choice: IP Rehab Living arrangements for the past 2 months: [place] Expected Discharge Date: 11/27/19                         HH Arranged: RN, PT, OT, Nurse's Aide [place] Agency: [place] Date [facility] Agency Contacted: 11/27/19 Time [place]: [phone] Representative spoke with at [place]: [place] ([place]) Interventions    Readmission Risk Interventions Readmission Risk Prevention Plan 11/25/2019  Medication Screening Complete  Transportation Screening Complete  Some recent data might be hidden

## 2019-11-27 NOTE — TOC Progression Note (Signed)
Transition of Care [facility]) - Progression Note    Patient Details  Name: Jesus Harmon MRN: [id number] Date of Birth: 1929-07-06  Transition of Care [facility]) CM/SW Contact  Purcell Mouton, RN Phone Number: 11/27/2019, 3:49 PM  Clinical Narrative:    Pt will discharge with [facility] for [facility]. Pt has DME [facility] and [facility].    Expected Discharge Plan: IP Rehab Facility Barriers to Discharge: Continued Medical Work up  Expected Discharge Plan and Services Expected Discharge Plan: [place] In-house Referral: Clinical Social Work   Post Acute Care Choice: IP Rehab Living arrangements for the past 2 months: [place] Expected Discharge Date: 11/27/19                         HH Arranged: RN, PT, OT, Nurse's Aide [place] Agency: [place] Date [facility] Agency Contacted: 11/27/19 Time [place]: [phone] Representative spoke with at [place]: [place] ([place]) Interventions    Readmission Risk Interventions Readmission Risk Prevention Plan 11/25/2019  Medication Screening Complete  Transportation Screening Complete  Some recent data might be hidden

## 2019-11-27 NOTE — TOC Progression Note (Signed)
Transition of Care [facility]) - Progression Note    Patient Details  Name: Jesus Harmon MRN: [id number] Date of Birth: [birth year]  Transition of Care [facility]) CM/SW Contact  Purcell Mouton, RN Phone Number: 11/27/2019, 4:15 PM  Clinical Narrative:    Pt's daughter [name] called and states that pt is awake and may take him home. Informed [name] that [place] Have a sitters service also. Tommi Rumps, RN with [facility] was contacted for sitter information for family.    Expected Discharge Plan: IP Rehab Facility Barriers to Discharge: Continued Medical Work up  Expected Discharge Plan and Services Expected Discharge Plan: [place] In-house Referral: Clinical Social Work   Post Acute Care Choice: IP Rehab Living arrangements for the past 2 months: [place] Expected Discharge Date: 11/27/19                         HH Arranged: RN, PT, OT, Nurse's Aide [place] Agency: [place] Date [facility] Agency Contacted: 11/27/19 Time [place]: [phone] Representative spoke with at [place]: [place] ([place]) Interventions    Readmission Risk Interventions Readmission Risk Prevention Plan 11/25/2019  Medication Screening Complete  Transportation Screening Complete  Some recent data might be hidden

## 2019-11-27 NOTE — Progress Notes (Signed)
Physical Therapy Treatment Patient Details Name: Jesus Harmon MRN: [id number] DOB: Dec 15, 1929 Today's Date: 11/27/2019    History of Present Illness Pt is 86 yo male with hx of CAD s/p stent, HF, dementia, HTN, and hyperlipidemia.  Pt had a fall several days ago with L hip pain.  He was seen at [facility] and found to have L hip fx.  Pt is now s/p anterior THA on 11/23/19.    PT Comments    Pt with increased confusion and difficulty with motor planning/safe techniques today.  Daughter present and reports pt is sundowning in hospital and also concerned may be medication related.  Pt required up to mod A of 2 today and was only able to ambulate a few steps. Significant decrease compared to yesterday but seems to be more cognitive/dementia related.  Did note that insurance declined CIR and family does not want SNF.  If going home, recommend strong 24 hr supervision , max HH services, and below equipment.  Did add w/c to equipment recommendation due to declined performance and would be safe to have w/c at home for mobility. Hopeful that pt will progress in familiar environment of home (family reports does not sundown at home).     Follow Up Recommendations  Other (comment) (CIR declined by insurance, Family refuses SNF, if home then 24 hr supv and max HH services)     Equipment Recommendations  3in1 (PT);Wheelchair (measurements PT);Wheelchair cushion (measurements PT) (recommend w/c for safety; shower chair)    Recommendations for Other Services       Precautions / Restrictions Precautions Precautions: Fall Restrictions LLE Weight Bearing: Weight bearing as tolerated    Mobility  Bed Mobility Overal bed mobility: Needs Assistance Bed Mobility: Supine to Sit     Supine to sit: Mod assist     General bed mobility comments: Educated daughters on transfer techniques and use of bed pad to facilitate; pt requiring increased assist and difficulty following cues  Transfers Overall  transfer level: Needs assistance Equipment used: Rolling [place] (2 wheeled) Transfers: Sit to/from Stand Sit to Stand: +2 physical assistance;Mod assist         General transfer comment: Sit to stand x 3 throughout session; signficant cues and assist for safe hand placement and technique; pt had difficulty standing upright and placing hands on [place].  Pt fearful to sit despite cues that there is a seat behind him - required physical assist to sit  Ambulation/Gait Ambulation/Gait assistance: Mod assist;+2 physical assistance Gait Distance (Feet): 4 Feet Assistive device: Rolling [place] (2 wheeled) Gait Pattern/deviations: Decreased stride length;Trunk flexed;Step-to pattern Gait velocity: decreased   General Gait Details: Pt not following commands, trunk flexed, picking [place] up off of floor, he was able to support self but required mod A x 2 for weight shifting and to get backed to chair due to not following commands   Stairs             Wheelchair Mobility    Modified Rankin (Stroke Patients Only)       Balance Overall balance assessment: Needs assistance Sitting-balance support: Feet supported;No upper extremity supported;Single extremity supported Sitting balance-Leahy Scale: Poor Sitting balance - Comments: Pt leaning L today and required assist for balance   Standing balance support: Bilateral upper extremity supported Standing balance-Leahy Scale: Poor Standing balance comment: used RW, forward trunk, unsteady  Cognition Arousal/Alertness: Awake/alert Behavior During Therapy: WFL for tasks assessed/performed Overall Cognitive Status: Impaired/Different from baseline Area of Impairment: Orientation;Problem solving;Attention;Memory;Following commands;Safety/judgement;Awareness                 Orientation Level: Disoriented to;Place;Time Current Attention Level: Focused Memory: Decreased short-term  memory Following Commands: Follows one step commands inconsistently Safety/Judgement: Decreased awareness of safety;Decreased awareness of deficits Awareness: Emergent Problem Solving: Difficulty sequencing;Requires verbal cues;Requires tactile cues General Comments: Pt more confused and difficulty with motor planning today. Daughter does report he sundowns some here but not typically at home.  Daughter also reports feels may be related to meds he had last night.      Exercises      General Comments  Educated family on transfer technique  And recommendations.       Pertinent Vitals/Pain Pain Assessment: Faces Faces Pain Scale: Hurts little more Pain Location: L thigh, rubbing it Pain Descriptors / Indicators: Grimacing;Restless Pain Intervention(s): Monitored during session;Limited activity within patient's tolerance;Repositioned    Home Living                      Prior Function            PT Goals (current goals can now be found in the care plan section) Acute Rehab PT Goals Patient Stated Goal: get back to walking better PT Goal Formulation: With patient/family Time For Goal Achievement: 12/08/19 Potential to Achieve Goals: Good Progress towards PT goals: Not progressing toward goals - comment (increased confusion today)    Frequency    Min 5X/week      PT Plan Discharge plan needs to be updated    Co-evaluation              AM-PAC PT "6 Clicks" Mobility   Outcome Measure  Help needed turning from your back to your side while in a flat bed without using bedrails?: A Lot Help needed moving from lying on your back to sitting on the side of a flat bed without using bedrails?: A Lot   Help needed standing up from a chair using your arms (e.g., wheelchair or bedside chair)?: A Lot Help needed to walk in hospital room?: A Lot Help needed climbing 3-5 steps with a railing? : Total 6 Click Score: 9    End of Session Equipment Utilized During  Treatment: Gait belt Activity Tolerance: Other (comment) (limited due to confusion) Patient left: with call bell/phone within reach;with family/visitor present;in chair;with chair alarm set Nurse Communication: Mobility status PT Visit Diagnosis: Unsteadiness on feet (R26.81);Other abnormalities of gait and mobility (R26.89);Muscle weakness (generalized) (M62.81);History of falling (Z91.81)     Time: 5537-4827 PT Time Calculation (min) (ACUTE ONLY): 39 min  Charges:  $Gait Training: 8-22 mins $Therapeutic Activity: 23-37 mins                     Abran Richard, PT Acute Rehab Services Pager [phone] [facility] [place] 11/27/2019, 4:57 PM

## 2019-11-27 NOTE — Plan of Care (Signed)
  Problem: Education: Goal: Knowledge of General Education information will improve Description: Including pain rating scale, medication(s)/side effects and non-pharmacologic comfort measures Outcome: Progressing   Problem: Health Behavior/Discharge Planning: Goal: Ability to manage health-related needs will improve Outcome: Progressing   Problem: Clinical Measurements: Goal: Ability to maintain clinical measurements within normal limits will improve Outcome: Progressing Goal: Will remain free from infection Outcome: Progressing Goal: Diagnostic test results will improve Outcome: Progressing Goal: Respiratory complications will improve Outcome: Progressing Goal: Cardiovascular complication will be avoided Outcome: Progressing   Problem: Activity: Goal: Risk for activity intolerance will decrease Outcome: Progressing   Problem: Nutrition: Goal: Adequate nutrition will be maintained Outcome: Progressing  Pt is alert and confused and lethargy more today than yesterday. Pt was to fatigue to move from bed to chair. Physical therapy will work with patient today, noted. Pt receive prn medication for leg spasms, noted.  Problem: Coping: Goal: Level of anxiety will decrease Outcome: Progressing   Problem: Elimination: Goal: Will not experience complications related to bowel motility Outcome: Progressing Goal: Will not experience complications related to urinary retention Outcome: Progressing   Problem: Pain Managment: Goal: General experience of comfort will improve Outcome: Progressing   Problem: Safety: Goal: Ability to remain free from injury will improve Outcome: Progressing   Problem: Skin Integrity: Goal: Risk for impaired skin integrity will decrease Outcome: Progressing

## 2019-11-27 NOTE — Plan of Care (Signed)
Was called by case manager that patient was lethargic and asleep and family did not feel comfortable taking patient home this evening and wanted to wait for patient to be discharged in the morning. Spoke again with RN, it was stated that physical therapy worked with the patient and he did not do too well and family wants to wait till tomorrow to work with physical therapy again and then subsequently be discharged home after that.  No charge.

## 2019-11-27 NOTE — Progress Notes (Signed)
Inpatient Rehabilitation Admissions Coordinator  I have received a denial from [facility] for CIR admit request. I spoke with patient's daughter, [name], by phone . She declines SNF rehab. She would like home health set up. I have notified acute team and TOC. We will sign off at this time.  Danne Baxter, RN, MSN Rehab Admissions Coordinator [phone] 11/27/2019 2:51 PM

## 2019-11-28 ENCOUNTER — Encounter (HOSPITAL_COMMUNITY): Payer: Self-pay | Admitting: Orthopedic Surgery

## 2019-11-28 DIAGNOSIS — S72002A Fracture of unspecified part of neck of left femur, initial encounter for closed fracture: Secondary | ICD-10-CM | POA: Diagnosis not present

## 2019-11-28 MED ORDER — ASPIRIN 81 MG PO CHEW: 81.0000 mg | CHEWABLE_TABLET | Freq: Every day | ORAL | 1 refills | Status: AC

## 2019-11-28 MED ORDER — ACETAMINOPHEN 500 MG PO TABS: 1000.0000 mg | ORAL_TABLET | Freq: Three times a day (TID) | ORAL | 0 refills | Status: AC | PRN

## 2019-11-28 MED FILL — OLANZapine 5 MG TBDP: 5 | 10 days supply | Qty: 10 | Fill #0

## 2019-11-28 NOTE — Progress Notes (Signed)
Physical Therapy Treatment Patient Details Name: Jesus Harmon MRN: [id number] DOB: 09/13/1929 Today's Date: 11/28/2019    History of Present Illness Pt is 86 yo male with hx of CAD s/p stent, HF, dementia, HTN, and hyperlipidemia.  Pt had a fall several days ago with L hip pain.  He was seen at [facility] and found to have L hip fx.  Pt is now s/p anterior THA on 11/23/19.    PT Comments    The patient is alert and able to  Ambulate x 100' with 2 persons, mod assistance. Patient's daughters present and plan to to take patient homme. Patient currently will require close 24/7 supervision for safety and 2 assist for ambulation. Patient noted to be listing to the left while sitting in recliner and bed.   Follow Up Recommendations  Other (comment)     Equipment Recommendations  3in1 (PT)    Recommendations for Other Services       Precautions / Restrictions Precautions Precautions: Fall Restrictions LLE Weight Bearing: Weight bearing as tolerated    Mobility  Bed Mobility Overal bed mobility: Needs Assistance Bed Mobility: Supine to Sit     Supine to sit: Max assist;+2 for physical assistance;+2 for safety/equipment        Transfers Overall transfer level: Needs assistance Equipment used: Rolling [place] (2 wheeled) Transfers: Sit to/from Stand Sit to Stand: +2 physical assistance;Mod assist;+2 safety/equipment         General transfer comment: multimodal cues  for hand placement, extra time to follow, daughter present and able to provide verbal assistance for patient to follow.  Ambulation/Gait Ambulation/Gait assistance: Mod assist;+2 safety/equipment;+2 physical assistance Gait Distance (Feet): 100 Feet Assistive device: Rolling [place] (2 wheeled) Gait Pattern/deviations: Step-to pattern;Step-through pattern Gait velocity: decreased   General Gait Details: multimodal cues for position inside RW, for turns and to sit down. Required strong  tactile cues to  sit into recliner.   Stairs             Wheelchair Mobility    Modified Rankin (Stroke Patients Only)       Balance Overall balance assessment: Needs assistance Sitting-balance support: Feet supported;No upper extremity supported;Single extremity supported Sitting balance-Leahy Scale: Poor Sitting balance - Comments: Pt leaning L today and required assist for balance   Standing balance support: Bilateral upper extremity supported Standing balance-Leahy Scale: Poor Standing balance comment: used RW, forward trunk, unsteady                            Cognition Arousal/Alertness: Awake/alert Behavior During Therapy: Restless Overall Cognitive Status: Impaired/Different from baseline Area of Impairment: Attention;Following commands;Safety/judgement;Awareness;Memory                 Orientation Level: Disoriented to;Place;Time;Situation Current Attention Level: Focused Memory: Decreased short-term memory;Decreased recall of precautions Following Commands: Follows one step commands inconsistently Safety/Judgement: Decreased awareness of safety;Decreased awareness of deficits Awareness: Intellectual Problem Solving: Difficulty sequencing;Requires verbal cues;Requires tactile cues General Comments: per daughter, patient awake most of night, mumbling, nonsensible  when understandable.      Exercises      General Comments        Pertinent Vitals/Pain Faces Pain Scale: No hurt    Home Living                      Prior Function            PT Goals (current goals can  now be found in the care plan section) Progress towards PT goals: Progressing toward goals    Frequency    Min 5X/week      PT Plan Current plan remains appropriate;Frequency needs to be updated    Co-evaluation PT/OT/SLP Co-Evaluation/Treatment: Yes Reason for Co-Treatment: For patient/therapist safety PT goals addressed during session: Mobility/safety with  mobility        AM-PAC PT "6 Clicks" Mobility   Outcome Measure  Help needed turning from your back to your side while in a flat bed without using bedrails?: A Lot Help needed moving from lying on your back to sitting on the side of a flat bed without using bedrails?: A Lot Help needed moving to and from a bed to a chair (including a wheelchair)?: A Lot Help needed standing up from a chair using your arms (e.g., wheelchair or bedside chair)?: A Lot Help needed to walk in hospital room?: A Lot Help needed climbing 3-5 steps with a railing? : Total 6 Click Score: 11    End of Session Equipment Utilized During Treatment: Gait belt Activity Tolerance: Patient tolerated treatment well Patient left: in chair;with call bell/phone within reach Nurse Communication: Mobility status       Time: 4695-0722 PT Time Calculation (min) (ACUTE ONLY): 35 min  Charges:  $Gait Training: 8-22 mins                     [place] Pager [phone] Office [phone]    Claretha Cooper 11/28/2019, 1:36 PM

## 2019-11-28 NOTE — Progress Notes (Addendum)
86 yo with hx CAD s/p stent, HFpEF, vascular dementia, HTN, HLD seen for Evolet Salminen L hip fracture.  He's s/p L total hip arthroplasty on 11/12.  Hospitalization has been complicated by delirium in the setting of hospitalization, hip fx, and surgery.  He's been started on zyprexa.  Plan for discharge today.  See d/c summary for additional details regarding hospitalization and follow up recommendations.  No c/o pain.  Confused.  Calls daughters the wrong names.  Daughters [name] and [place] at bedside.  Acknowledge his confusion compared to baseline, they want to try to bring him home.      Vitals:   11/28/19 0527 11/28/19 0854  BP: (!) 142/56 (!) 143/52  Pulse: 79   Resp: 18   Temp: 97.8 F (36.6 C)   SpO2: 98%    NAD RRR CTAB, no adventitious breath sounds S/NT/ND No LEE, left hip with intact dressing Confused, disoriented, doesn't remember his daughters names today No focal neuro deficits, moving all extremities  Shadiyah Wernli&P L femoral neck fx s/p L total hip arthroplasty - plan for d/c today with outpatient ortho follow up - SNF recommended, but family declines.  Plan for home with [facility] and 24/7 care.  Will add SW to [facility] orders in case he eventually needs placement.  Dementia with behavioral disturbance - continue zyprexa nightly at discharge, he's delirious today - hopefully this will improve in home environment.  Family wants to try to bring him home.    Needs outpatient follow up for pulm nodule  See previous d/c note from 11/16 for additional details  Discharge Instructions   Discharge Instructions    Call MD for:  difficulty breathing, headache or visual disturbances   Complete by: As directed    Call MD for:  extreme fatigue   Complete by: As directed    Call MD for:  hives   Complete by: As directed    Call MD for:  persistant dizziness or light-headedness   Complete by: As directed    Call MD for:  persistant nausea and vomiting   Complete by: As directed    Call MD for:  redness,  tenderness, or signs of infection (pain, swelling, redness, odor or green/yellow discharge around incision site)   Complete by: As directed    Call MD for:  severe uncontrolled pain   Complete by: As directed    Call MD for:  temperature >100.4   Complete by: As directed    Diet - low sodium heart healthy   Complete by: As directed    Diet - low sodium heart healthy   Complete by: As directed    Discharge instructions   Complete by: As directed    You were seen for Olukemi Panchal left hip fracture which was repaired surgically by orthopedics.  You were started on zyprexa for delirium.  You can continue this nightly as needed.  Please follow up with your PCP regarding this.  You have Issabela Lesko pulmonary nodule that should be followed up with Briceyda Abdullah repeat CT scan.  This was concerning for Kishon Garriga possible neoplastic focus (cancer).  Please follow up with pulmonary outpatient.   Continue tylenol and norco as needed for pain.  Do not go over 3 grams (3000 mg) of total tylenol daily (norco has tylenol in it).  Return for new, recurrent, or worsening symptoms.  Please ask your PCP to request records from this hospitalization so they know what was done and what the next steps will be.   Discharge wound  care:   Complete by: As directed    As above   Discharge wound care:   Complete by: As directed    Per orthopedics   Increase activity slowly   Complete by: As directed    Increase activity slowly   Complete by: As directed      Allergies as of 11/28/2019      Reactions   Amoxicillin Swelling, Rash, Other (See Comments)   Swelling and rash to face Has patient had Curtis Cain PCN reaction causing immediate rash, facial/tongue/throat swelling, SOB or lightheadedness with hypotension: Yes Has patient had Abbigale Mcelhaney PCN reaction causing severe rash involving mucus membranes or skin necrosis: No Has patient had Angelique Chevalier PCN reaction that required hospitalization: No Has patient had Anyae Griffith PCN reaction occurring within the last 10 years: Yes If all  of the above answers are "NO", then may proceed with Cephalosporin use.   Sulfonamide Derivatives Hives   Sulfamethoxazole Hives, Other (See Comments)      Medication List    STOP taking these medications   diclofenac 75 MG EC tablet Commonly known as: VOLTAREN   memantine 10 MG tablet Commonly known as: Namenda     TAKE these medications   acetaminophen 500 MG tablet Commonly known as: TYLENOL Take 2 tablets (1,000 mg total) by mouth every 8 (eight) hours as needed for moderate pain. What changed: when to take this   aspirin EC 81 MG tablet Take 1 tablet (81 mg total) by mouth 2 (two) times daily with Haizley Cannella meal. What changed: when to take this   aspirin 81 MG chewable tablet Commonly known as: Aspirin Childrens Chew 1 tablet (81 mg total) by mouth daily. Start taking on: January 11, 2020 What changed: You were already taking Cathaleen Korol medication with the same name, and this prescription was added. Make sure you understand how and when to take each.   docusate sodium 100 MG capsule Commonly known as: COLACE Take 1 capsule (100 mg total) by mouth 2 (two) times daily.   HYDROcodone-acetaminophen 5-325 MG tablet Commonly known as: NORCO/VICODIN Take 1-2 tablets by mouth every 6 (six) hours as needed for moderate pain.   latanoprost 0.005 % ophthalmic solution Commonly known as: XALATAN Place 1 drop into both eyes at bedtime.   levocetirizine 5 MG tablet Commonly known as: XYZAL Take 5 mg by mouth every evening.   lisinopril 10 MG tablet Commonly known as: ZESTRIL TAKE 1 TABLET BY MOUTH DAILY.   nitroGLYCERIN 0.4 MG SL tablet Commonly known as: NITROSTAT PLACE 1 TABLET UNDER THE TONGUE EVERY 5 MINUTES AS NEEDED FOR CHEST PAIN. What changed: See the new instructions.   OLANZapine zydis 5 MG disintegrating tablet Commonly known as: ZYPREXA Take 1 tablet (5 mg total) by mouth at bedtime as needed (agitation).   polyethylene glycol 17 g packet Commonly known as: MIRALAX /  GLYCOLAX Take 17 g by mouth daily as needed for mild constipation.   simvastatin 20 MG tablet Commonly known as: ZOCOR TAKE 1 TABLET BY MOUTH DAILY. What changed: when to take this   tiZANidine 2 MG tablet Commonly known as: ZANAFLEX Take 2 mg by mouth daily as needed for muscle spasms.   vitamin B-12 1000 MCG tablet Commonly known as: CYANOCOBALAMIN Take 1,000 mcg by mouth daily.            Durable Medical Equipment  (From admission, onward)         Start     Ordered   11/26/19 0850  For home  use only DME 4 wheeled rolling Caradine with seat  Once       Question:  Patient needs Regnia Mathwig Lequire to treat with the following condition  Answer:  Hip fracture ([place])   11/26/19 0849   11/26/19 0850  For home use only DME 3 n 1  Once        11/26/19 0849           Discharge Care Instructions  (From admission, onward)         Start     Ordered   11/28/19 0000  Discharge wound care:       Comments: Per orthopedics   11/28/19 1032   11/27/19 0000  Discharge wound care:       Comments: As above   11/27/19 1504         Allergies  Allergen Reactions  . Amoxicillin Swelling, Rash and Other (See Comments)    Swelling and rash to face Has patient had Kapena Hamme PCN reaction causing immediate rash, facial/tongue/throat swelling, SOB or lightheadedness with hypotension: Yes Has patient had Tiwan Schnitker PCN reaction causing severe rash involving mucus membranes or skin necrosis: No Has patient had Zoriah Pulice PCN reaction that required hospitalization: No Has patient had Idus Rathke PCN reaction occurring within the last 10 years: Yes If all of the above answers are "NO", then may proceed with Cephalosporin use.    . Sulfonamide Derivatives Hives  . Sulfamethoxazole Hives and Other (See Comments)    Follow-up Information    Swinteck, Aaron Edelman, MD. Schedule an appointment as soon as possible for Erisha Paugh visit in 2 weeks.   Specialty: Orthopedic Surgery Why: For wound re-check Contact information: [address]  [place] [address] [phone]        Alroy Dust, L.Marlou Sa, MD. Schedule an appointment as soon as possible for Ethon Wymer visit in 3 week(s).   Specialty: Family Medicine Contact information: [address] [place] [address] [phone]        Sherren Mocha, MD .   Specialty: Cardiology Contact information: [phone] [place] [address] [phone]        Care, [facility] Follow up.   Specialty: Home Health Services Why: Alvis Lemmings will follow you at home for Home Health. Please call the above number if you have any problems.  Contact information: [place] [address] [phone]                The results of significant diagnostics from this hospitalization (including imaging, microbiology, ancillary and laboratory) are listed below for reference.    Significant Diagnostic Studies: CT Head Wo Contrast  Result Date: 11/22/2019 CLINICAL DATA:  Head injury and neck pain after fall. EXAM: CT HEAD WITHOUT CONTRAST CT CERVICAL SPINE WITHOUT CONTRAST TECHNIQUE: Multidetector CT imaging of the head and cervical spine was performed following the standard protocol without intravenous contrast. Multiplanar CT image reconstructions of the cervical spine were also generated. COMPARISON:  September 05, 2013. FINDINGS: CT HEAD FINDINGS Brain: Mild chronic ischemic white matter disease is noted. Mild diffuse cortical atrophy is noted. No mass effect or midline shift is noted. Ventricular size is within normal limits. There is no evidence of mass lesion, hemorrhage or acute infarction. Vascular: No hyperdense vessel or unexpected calcification. Skull: Normal. Negative for fracture or focal lesion. Sinuses/Orbits: No acute finding. Other: None. CT CERVICAL SPINE FINDINGS Alignment: Minimal grade 1 retrolisthesis of C5-6 is noted secondary to severe degenerative disc disease  at this level. Skull base and vertebrae: No  acute fracture. No primary bone lesion or focal pathologic process. Soft tissues and spinal canal: No prevertebral fluid or swelling. No visible canal hematoma. Disc levels: Severe degenerative disc disease is noted at C4-5, C5-6, C6-7 and C7-T1. Upper chest: 11 x 6 mm irregular nodule is seen medially in right upper lobe with pleural tail concerning for possible neoplasm. Other: None. IMPRESSION: 1. Mild chronic ischemic white matter disease. Mild diffuse cortical atrophy. No acute intracranial abnormality seen. 2. Severe multilevel degenerative disc disease. No acute abnormality seen in the cervical spine. 3. 11 x 6 mm irregular nodule is seen medially in right upper lobe with pleural tail concerning for possible neoplasm. CT scan of the chest is recommended for further evaluation. Electronically Signed   By: Marijo Conception M.D.   On: 11/22/2019 13:22   CT CHEST WO CONTRAST  Result Date: 11/23/2019 CLINICAL DATA:  Nodular opacity seen right upper lobe on recent cervical spine CT examination EXAM: CT CHEST WITHOUT CONTRAST TECHNIQUE: Multidetector CT imaging of the chest was performed following the standard protocol without IV contrast. COMPARISON:  Cervical spine CT November 22, 2019. Chest radiograph June 24, 2018 FINDINGS: Cardiovascular: There is no appreciable thoracic aortic aneurysm. There is mild calcification at the origin of the left subclavian artery. Other visualized great vessels appear unremarkable on this noncontrast enhanced study. There are foci of aortic atherosclerosis. There are multiple foci of coronary artery calcification. There is no pericardial effusion or pericardial thickening. Mediastinum/Nodes: The thyroid is not enlarged. There are nodular opacities in the left lobe, none of which measure larger than 1 cm in size. No appreciable thoracic adenopathy noted. Several subcentimeter lymph nodes noted. No esophageal lesions are evident. Lungs/Pleura: There is localized scarring and  atelectasis in the right upper lobe anteriorly and medially at the site noted on recent CT. There is Kyarah Enamorado nodular appearing opacity within this area measuring 8 x 6 mm, best seen on axial slice 36 series 5. There is Vaani Morren bullous area with scarring in the posterior segment of the left upper lobe measuring 3.1 x 2.0 cm. There is an area of scarring with focal volume loss more inferiorly in the anterior segment of the left upper lobe which is felt to primarily be due to inflammatory change. There is an area that appears somewhat nodular within this area of scarring measuring 0.9 x 0.8 cm, best seen on axial slice 57 series 5. There is Shardae Kleinman focal nodular opacity in the right middle lobe lateral segment measuring 1.1 x 1.1 x 0.9 cm which is well defined. There is atelectatic change in the medial left base. No pleural effusions. There is mild lower lobe bronchiectatic change. No edema or consolidation evident. Upper Abdomen: There is upper abdominal aortic atherosclerosis. Visualized upper abdominal structures otherwise appear normal. Musculoskeletal: There is degenerative change in the thoracic spine. Degenerative changes noted in the left shoulder. No blastic or lytic bone lesions are evident. No chest wall lesions are appreciable. IMPRESSION: 1. Areas of parenchymal lung scarring and atelectasis. Somewhat nodular areas noted within areas of apparent scarring and atelectasis of uncertain etiology. These areas may entirely be due to inflammation; surveillance of these areas is warranted given the overall appearance, however. 2. There is Ohm Dentler well-defined nodular opacity in the right middle lobe lateral segment measuring 1.1 x 1.1 x 0.9 cm which is potentially more worrisome for Davey Limas small neoplastic focus. Consider one of the following in 3 months for both low-risk  and high-risk individuals: (Miyoshi Ligas) repeat chest CT, (b) follow-up PET-CT, or (c) tissue sampling. This recommendation follows the consensus statement: Guidelines for Management  of Incidental Pulmonary Nodules Detected on CT Images: From the Fleischner Society 2017; Radiology 2017; 284:228-243. Given multiple nodular appearing areas, nuclear medicine PET study at this time would be Lahela Woodin reasonable consideration. 3. No edema or airspace opacity. There is mild lower lobe bronchiectatic change bilaterally. 4.  No evident adenopathy. 5. Nodular opacities in the left lobe of the thyroid without enlargement. In the setting of significant comorbidities or limited life expectancy, no follow-up recommended beyond particular attention to this area on repeat CT imaging. (Ref: J Am Coll Radiol. 2015 Feb;12(2): 143-50). 6. Aortic atherosclerosis. Foci of coronary artery calcification noted. Aortic Atherosclerosis (ICD10-I70.0). Electronically Signed   By: Lowella Grip III M.D.   On: 11/23/2019 11:05   CT Cervical Spine Wo Contrast  Result Date: 11/22/2019 CLINICAL DATA:  Head injury and neck pain after fall. EXAM: CT HEAD WITHOUT CONTRAST CT CERVICAL SPINE WITHOUT CONTRAST TECHNIQUE: Multidetector CT imaging of the head and cervical spine was performed following the standard protocol without intravenous contrast. Multiplanar CT image reconstructions of the cervical spine were also generated. COMPARISON:  September 05, 2013. FINDINGS: CT HEAD FINDINGS Brain: Mild chronic ischemic white matter disease is noted. Mild diffuse cortical atrophy is noted. No mass effect or midline shift is noted. Ventricular size is within normal limits. There is no evidence of mass lesion, hemorrhage or acute infarction. Vascular: No hyperdense vessel or unexpected calcification. Skull: Normal. Negative for fracture or focal lesion. Sinuses/Orbits: No acute finding. Other: None. CT CERVICAL SPINE FINDINGS Alignment: Minimal grade 1 retrolisthesis of C5-6 is noted secondary to severe degenerative disc disease at this level. Skull base and vertebrae: No acute fracture. No primary bone lesion or focal pathologic process.  Soft tissues and spinal canal: No prevertebral fluid or swelling. No visible canal hematoma. Disc levels: Severe degenerative disc disease is noted at C4-5, C5-6, C6-7 and C7-T1. Upper chest: 11 x 6 mm irregular nodule is seen medially in right upper lobe with pleural tail concerning for possible neoplasm. Other: None. IMPRESSION: 1. Mild chronic ischemic white matter disease. Mild diffuse cortical atrophy. No acute intracranial abnormality seen. 2. Severe multilevel degenerative disc disease. No acute abnormality seen in the cervical spine. 3. 11 x 6 mm irregular nodule is seen medially in right upper lobe with pleural tail concerning for possible neoplasm. CT scan of the chest is recommended for further evaluation. Electronically Signed   By: Marijo Conception M.D.   On: 11/22/2019 13:22   Pelvis Portable  Result Date: 11/23/2019 CLINICAL DATA:  Postop left hip replacement EXAM: PORTABLE PELVIS 1-2 VIEWS COMPARISON:  None. FINDINGS: Mild to moderate arthritis of the right hip. Pubic symphysis and rami appear intact. Status post left hip replacement with intact hardware and normal alignment. No fracture. Gas in the soft tissues consistent with recent surgery IMPRESSION: Status post left hip replacement with expected postsurgical changes. Electronically Signed   By: Donavan Foil M.D.   On: 11/23/2019 20:24   DG CHEST PORT 1 VIEW  Result Date: 11/25/2019 CLINICAL DATA:  Shortness of breath, hypertension EXAM: PORTABLE CHEST 1 VIEW COMPARISON:  11/23/2019, 06/24/2018 FINDINGS: Single frontal view of the chest demonstrates Torris House stable cardiac silhouette. 1.1 cm nodule overlying the right anterior fourth intercostal space corresponds to the right middle lobe nodule seen on recent CT. The areas of consolidation within the upper lung zones on CT are  less apparent by chest x-ray. No other airspace disease, effusion, or pneumothorax. No acute bony abnormalities. IMPRESSION: 1. Right middle lobe nodule as above. Please  refer to recent chest CT report describing recommendations for follow-up evaluation. 2. The areas of upper lobe consolidation and potential scarring seen on prior CT are not as apparent on chest x-ray. Electronically Signed   By: Randa Ngo M.D.   On: 11/25/2019 15:46   DG C-Arm 1-60 Min-No Report  Result Date: 11/23/2019 Fluoroscopy was utilized by the requesting physician.  No radiographic interpretation.   DG HIP OPERATIVE UNILAT W OR W/O PELVIS LEFT  Result Date: 11/23/2019 CLINICAL DATA:  Left hip arthroplasty EXAM: OPERATIVE left HIP (WITH PELVIS IF PERFORMED) 2 VIEWS TECHNIQUE: Fluoroscopic spot image(s) were submitted for interpretation post-operatively. COMPARISON:  None FINDINGS: Two low resolution intraoperative spot views of the left hip. Total fluoroscopy time was 8 seconds. The images demonstrate Tal Neer left hip replacement with normal alignment IMPRESSION: Intraoperative fluoroscopic assistance provided during left hip replacement. Electronically Signed   By: Donavan Foil M.D.   On: 11/23/2019 20:23    Microbiology: Recent Results (from the past 240 hour(s))  Respiratory Panel by RT PCR (Flu Samantha Olivera&B, Covid) - Nasopharyngeal Swab     Status: None   Collection Time: 11/22/19 12:13 PM   Specimen: Nasopharyngeal Swab  Result Value Ref Range Status   SARS Coronavirus 2 by RT PCR NEGATIVE NEGATIVE Final    Comment: (NOTE) SARS-CoV-2 target nucleic acids are NOT DETECTED.  The SARS-CoV-2 RNA is generally detectable in upper respiratoy specimens during the acute phase of infection. The lowest concentration of SARS-CoV-2 viral copies this assay can detect is 131 copies/mL. Fani Rotondo negative result does not preclude SARS-Cov-2 infection and should not be used as the sole basis for treatment or other patient management decisions. Shelly Shoultz negative result may occur with  improper specimen collection/handling, submission of specimen other than nasopharyngeal swab, presence of viral mutation(s) within  the areas targeted by this assay, and inadequate number of viral copies (<131 copies/mL). Miyana Mordecai negative result must be combined with clinical observations, patient history, and epidemiological information. The expected result is Negative.  Fact Sheet for Patients:  PinkCheek.be  Fact Sheet for Healthcare Providers:  GravelBags.it  This test is no t yet approved or cleared by the [place] FDA and  has been authorized for detection and/or diagnosis of SARS-CoV-2 by FDA under an Emergency Use Authorization (EUA). This EUA will remain  in effect (meaning this test can be used) for the duration of the COVID-19 declaration under Section 564(b)(1) of the Act, 21 U.S.C. section 360bbb-3(b)(1), unless the authorization is terminated or revoked sooner.     Influenza Dawnmarie Breon by PCR NEGATIVE NEGATIVE Final   Influenza B by PCR NEGATIVE NEGATIVE Final    Comment: (NOTE) The Xpert Xpress SARS-CoV-2/FLU/RSV assay is intended as an aid in  the diagnosis of influenza from Nasopharyngeal swab specimens and  should not be used as Seynabou Fults sole basis for treatment. Nasal washings and  aspirates are unacceptable for Xpert Xpress SARS-CoV-2/FLU/RSV  testing.  Fact Sheet for Patients: PinkCheek.be  Fact Sheet for Healthcare Providers: GravelBags.it  This test is not yet approved or cleared by the [place] FDA and  has been authorized for detection and/or diagnosis of SARS-CoV-2 by  FDA under an Emergency Use Authorization (EUA). This EUA will remain  in effect (meaning this test can be used) for the duration of the  Covid-19 declaration under Section 564(b)(1) of the Act, 21  U.S.C. section 360bbb-3(b)(1), unless the authorization is  terminated or revoked. Performed at [facility], [place] [address], [place], [place] [address]   Culture, Urine     Status: None    Collection Time: 11/24/19  8:35 AM   Specimen: Urine, Catheterized  Result Value Ref Range Status   Specimen Description   Final    URINE, CATHETERIZED Performed at [facility] [address], [place], [place] [address]    Special Requests   Final    URINE, RANDOM Performed at [facility] [address], [place], [place] [address]    Culture   Final    NO GROWTH Performed at [facility], [place] [address] [place], [place] [address]    Report Status 11/25/2019 FINAL  Final     Labs: Basic Metabolic Panel: Recent Labs  Lab 11/23/19 0819 11/24/19 0554 11/25/19 0533 11/26/19 0440 11/27/19 0443  NA 138 136 140 140 139  K 4.1 4.2 4.0 3.3* 4.2  CL 104 102 106 108 107  CO2 24 24 25 26 26   GLUCOSE 102* 113* 98 121* 119*  BUN 19 25* 19 21 25*  CREATININE 0.84 1.12 0.83 0.81 0.82  CALCIUM 8.9 9.0 8.8* 8.5* 8.7*  MG 2.0  --   --   --   --    Liver Function Tests: No results for input(s): AST, ALT, ALKPHOS, BILITOT, PROT, ALBUMIN in the last 168 hours. No results for input(s): LIPASE, AMYLASE in the last 168 hours. No results for input(s): AMMONIA in the last 168 hours. CBC: Recent Labs  Lab 11/22/19 1212 11/22/19 1212 11/23/19 0819 11/24/19 0554 11/25/19 0533 11/26/19 0440 11/27/19 0443  WBC 8.4  --  7.9 12.6* 8.8 9.9  --   NEUTROABS 6.2  --  6.3 10.7*  --   --   --   HGB 9.6*   < > 9.3* 9.3* 9.6* 8.7* 8.4*  HCT 31.4*   < > 29.9* 30.2* 31.2* 28.3* 26.9*  MCV 93.5  --  93.4 93.2 92.6 92.8  --   PLT 357  --  357 358 326 296  --    < > = values in this interval not displayed.   Cardiac Enzymes: No results for input(s): CKTOTAL, CKMB, CKMBINDEX, TROPONINI in the last 168 hours. BNP: BNP (last 3 results) No results for input(s): BNP in the last 8760 hours.  ProBNP (last 3 results) No results for input(s): PROBNP in the last 8760 hours.  CBG: No results for input(s): GLUCAP in the last 168  hours.     Signed:  Fayrene Helper MD.  [facility] 11/28/2019, 11:19 AM

## 2019-11-28 NOTE — Progress Notes (Signed)
Provided discharge instructions to daughter, Manon Hilding. Pt's daughter verbalized understanding. PPT's daughter is worried about whether they should take pt home with home health instead of SNF placement after education regarding his new mobility and mental status. Daughter stated that the family has decided to take him him to see if his increased confusion will improve.  Jerene Pitch

## 2019-11-28 NOTE — TOC Progression Note (Signed)
Transition of Care [facility]) - Progression Note    Patient Details  Name: Jesus Harmon MRN: [id number] Date of Birth: Jan 03, 1930  Transition of Care [facility]) CM/SW Contact  Purcell Mouton, RN Phone Number: 11/28/2019, 12:27 PM  Clinical Narrative:    Spoke with pt concerning discharge plans home or SNF. Both daughters at bedside state that they will take pt home with [facility]. Informed daughters that [facility] offered pt a bed, however would not have one available until tomorrow or Friday. CSW was added to [facility] needs. Encouraged daughters to allow [facility] to transport pt home related to pt being 2+ assist.    Expected Discharge Plan: IP Rehab Facility Barriers to Discharge: Continued Medical Work up  Expected Discharge Plan and Services Expected Discharge Plan: [place] In-house Referral: Clinical Social Work   Post Acute Care Choice: IP Rehab Living arrangements for the past 2 months: [place] Expected Discharge Date: 11/28/19                         HH Arranged: RN, PT, OT, Nurse's Aide [place] Agency: [place] Date [facility] Agency Contacted: 11/27/19 Time [place]: [phone] Representative spoke with at [place]: [place] ([place]) Interventions    Readmission Risk Interventions Readmission Risk Prevention Plan 11/25/2019  Medication Screening Complete  Transportation Screening Complete  Some recent data might be hidden

## 2019-11-28 NOTE — Progress Notes (Signed)
Occupational Therapy Progress Note  Patient very tangential, mumbled speech with difficulty understanding at times, unable to hold coherent conversation. Patient requiring increased time and multimodal cues with hand over hand assist for body mechanics during functional transfers. Patient initially mod A x2 to power up to standing, progresses to min A x2 for functional ambulation. Educated patient's DTRs he will need close 24/7 supervision and assistance at home due to high fall risk, verbalize understanding.    11/28/19 1400  OT Visit Information  Last OT Received On 11/28/19  Assistance Needed +2  PT/OT/SLP Co-Evaluation/Treatment Yes  Reason for Co-Treatment To address functional/ADL transfers;For patient/therapist safety  OT goals addressed during session ADL's and self-care  History of Present Illness Pt is 86 yo male with hx of CAD s/p stent, HF, dementia, HTN, and hyperlipidemia.  Pt had a fall several days ago with L hip pain.  He was seen at [facility] and found to have L hip fx.  Pt is now s/p anterior THA on 11/23/19.  Precautions  Precautions Fall  Pain Assessment  Pain Assessment Faces  Faces Pain Scale 0  Cognition  Arousal/Alertness Awake/alert  Behavior During Therapy Restless  Overall Cognitive Status Impaired/Different from baseline  Area of Impairment Attention;Following commands;Safety/judgement;Awareness;Memory  Current Attention Level Focused  Memory Decreased short-term memory;Decreased recall of precautions  Following Commands Follows one step commands inconsistently  Safety/Judgement Decreased awareness of safety;Decreased awareness of deficits  Awareness Intellectual  Problem Solving Difficulty sequencing;Requires verbal cues;Requires tactile cues  General Comments per daughter, patient awake most of night, mumbling/tangential, difficult to understand  ADL  Overall ADL's  Needs assistance/impaired  Toilet Transfer Moderate assistance;+2 for physical  assistance;+2 for safety/equipment;Cueing for safety;Cueing for sequencing;Ambulation;BSC;RW  Toilet Transfer Details (indicate cue type and reason) simulated with functional ambulation and transfer to recliner, mod A x2 to power up to standing then progresses to min x2 for safety  Bed Mobility  Overal bed mobility Needs Assistance  Bed Mobility Supine to Sit  Supine to sit Max assist;+2 for physical assistance;+2 for safety/equipment  General bed mobility comments utilize bed pad to transition to sitting up at EOB, patient does not initiate LE movement to EOB with multimodal cues  Balance  Overall balance assessment Needs assistance  Sitting-balance support Feet supported;No upper extremity supported;Single extremity supported  Sitting balance-Leahy Scale Poor  Sitting balance - Comments Pt leaning L today and required assist for balance  Standing balance support Bilateral upper extremity supported  Standing balance-Leahy Scale Poor  Standing balance comment used RW, forward trunk, unsteady  Restrictions  Weight Bearing Restrictions Yes  LLE Weight Bearing WBAT  Transfers  Overall transfer level Needs assistance  Equipment used Rolling [place] (2 wheeled)  Transfers Sit to/from Stand  Sit to Stand +2 physical assistance;Mod assist;+2 safety/equipment  General transfer comment multimodal cues for body mechanics, patient requiring increased time and difficulty with direction following requiring hand over hand assist to transition hands during sit <> stand  General Comments  General comments (skin integrity, edema, etc.) HR up to 140s with functional ambulation  OT - End of Session  Equipment Utilized During Treatment Gait belt;Rolling [place]  Activity Tolerance Patient tolerated treatment well  Patient left in chair;with call bell/phone within reach;with chair alarm set  Nurse Communication Mobility status  OT Assessment/Plan  OT Plan Discharge plan needs to be updated  OT Visit  Diagnosis Unsteadiness on feet (R26.81);Other abnormalities of gait and mobility (R26.89);History of falling (Z91.81);Pain  Pain - Right/Left Left  Pain -  part of body Hip  OT Frequency (ACUTE ONLY) Min 2X/week  Follow Up Recommendations Home health OT;Supervision/Assistance - 24 hour;Other (comment) (family declining SNF, CIR denied)  OT Equipment 3 in 1 bedside commode  AM-PAC OT "6 Clicks" Daily Activity Outcome Measure (Version 2)  Help from another person eating meals? 3  Help from another person taking care of personal grooming? 3  Help from another person toileting, which includes using toliet, bedpan, or urinal? 2  Help from another person bathing (including washing, rinsing, drying)? 2  Help from another person to put on and taking off regular upper body clothing? 2  Help from another person to put on and taking off regular lower body clothing? 1  6 Click Score 13  OT Goal Progression  Progress towards OT goals Progressing toward goals  Acute Rehab OT Goals  Patient Stated Goal get back to walking better  OT Goal Formulation With patient/family  Time For Goal Achievement 12/10/19  Potential to Achieve Goals Good  ADL Goals  Pt Will Perform Grooming with supervision;standing  Pt Will Perform Upper Body Dressing with modified independence;sitting  Pt Will Perform Lower Body Dressing with min assist;with caregiver independent in assisting;with adaptive equipment;sit to/from stand  Pt Will Transfer to Toilet with supervision;ambulating  Pt Will Perform Toileting - Clothing Manipulation and hygiene with modified independence;sitting/lateral leans  Additional ADL Goal #1 Pt will perform bed mobility at supervision level prior to engaging in ADL  OT Time Calculation  OT Start Time (ACUTE ONLY) 0934  OT Stop Time (ACUTE ONLY) 1002  OT Time Calculation (min) 28 min  OT General Charges  $OT Visit 1 Visit  OT Treatments  $Self Care/Home Management  8-22 mins   Delbert Phenix OT OT  pager: [phone]

## 2019-11-29 DIAGNOSIS — I2510 Atherosclerotic heart disease of native coronary artery without angina pectoris: Secondary | ICD-10-CM | POA: Diagnosis not present

## 2019-11-29 DIAGNOSIS — M1611 Unilateral primary osteoarthritis, right hip: Secondary | ICD-10-CM | POA: Diagnosis not present

## 2019-11-29 DIAGNOSIS — J479 Bronchiectasis, uncomplicated: Secondary | ICD-10-CM | POA: Diagnosis not present

## 2019-11-29 DIAGNOSIS — I110 Hypertensive heart disease with heart failure: Secondary | ICD-10-CM | POA: Diagnosis not present

## 2019-11-29 DIAGNOSIS — I5032 Chronic diastolic (congestive) heart failure: Secondary | ICD-10-CM | POA: Diagnosis not present

## 2019-11-29 DIAGNOSIS — D62 Acute posthemorrhagic anemia: Secondary | ICD-10-CM | POA: Diagnosis not present

## 2019-11-29 DIAGNOSIS — F0151 Vascular dementia with behavioral disturbance: Secondary | ICD-10-CM | POA: Diagnosis not present

## 2019-11-29 DIAGNOSIS — S72002D Fracture of unspecified part of neck of left femur, subsequent encounter for closed fracture with routine healing: Secondary | ICD-10-CM | POA: Diagnosis not present

## 2019-11-29 DIAGNOSIS — I0981 Rheumatic heart failure: Secondary | ICD-10-CM | POA: Diagnosis not present

## 2019-11-29 DIAGNOSIS — M4312 Spondylolisthesis, cervical region: Secondary | ICD-10-CM | POA: Diagnosis not present

## 2019-11-29 DIAGNOSIS — F05 Delirium due to known physiological condition: Secondary | ICD-10-CM | POA: Diagnosis not present

## 2019-11-29 DIAGNOSIS — I051 Rheumatic mitral insufficiency: Secondary | ICD-10-CM | POA: Diagnosis not present

## 2019-11-29 DIAGNOSIS — Z96642 Presence of left artificial hip joint: Secondary | ICD-10-CM | POA: Diagnosis not present

## 2019-11-29 DIAGNOSIS — M47812 Spondylosis without myelopathy or radiculopathy, cervical region: Secondary | ICD-10-CM | POA: Diagnosis not present

## 2019-11-29 DIAGNOSIS — G319 Degenerative disease of nervous system, unspecified: Secondary | ICD-10-CM | POA: Diagnosis not present

## 2019-11-29 DIAGNOSIS — M50321 Other cervical disc degeneration at C4-C5 level: Secondary | ICD-10-CM | POA: Diagnosis not present

## 2019-12-04 DIAGNOSIS — Z471 Aftercare following joint replacement surgery: Secondary | ICD-10-CM | POA: Diagnosis not present

## 2019-12-04 DIAGNOSIS — Z96642 Presence of left artificial hip joint: Secondary | ICD-10-CM | POA: Diagnosis not present

## 2019-12-10 MED FILL — LISINOPRIL 10 MG TABS: 10 | 90 days supply | Qty: 90 | Fill #2

## 2019-12-12 DIAGNOSIS — R296 Repeated falls: Secondary | ICD-10-CM | POA: Diagnosis not present

## 2019-12-12 DIAGNOSIS — L309 Dermatitis, unspecified: Secondary | ICD-10-CM | POA: Diagnosis not present

## 2019-12-12 DIAGNOSIS — L8990 Pressure ulcer of unspecified site, unspecified stage: Secondary | ICD-10-CM | POA: Diagnosis not present

## 2019-12-13 MED FILL — LATANOPROST 0.005% EYE DRP: 0.005 | 25 days supply | Qty: 3 | Fill #1

## 2019-12-21 MED FILL — LEVOCETIRIZINE 5 MG TABLET: 5 | 30 days supply | Qty: 30 | Fill #3

## 2019-12-24 ENCOUNTER — Other Ambulatory Visit (HOSPITAL_COMMUNITY): Payer: Self-pay | Admitting: Family Medicine

## 2019-12-24 MED FILL — tiZANidine HCL 2 MG TABS: 2 | 5 days supply | Qty: 30 | Fill #0

## 2020-01-01 DIAGNOSIS — M1611 Unilateral primary osteoarthritis, right hip: Secondary | ICD-10-CM | POA: Diagnosis not present

## 2020-01-01 DIAGNOSIS — D62 Acute posthemorrhagic anemia: Secondary | ICD-10-CM | POA: Diagnosis not present

## 2020-01-01 DIAGNOSIS — I051 Rheumatic mitral insufficiency: Secondary | ICD-10-CM | POA: Diagnosis not present

## 2020-01-01 DIAGNOSIS — I2510 Atherosclerotic heart disease of native coronary artery without angina pectoris: Secondary | ICD-10-CM | POA: Diagnosis not present

## 2020-01-01 DIAGNOSIS — S72002D Fracture of unspecified part of neck of left femur, subsequent encounter for closed fracture with routine healing: Secondary | ICD-10-CM | POA: Diagnosis not present

## 2020-01-01 DIAGNOSIS — M50321 Other cervical disc degeneration at C4-C5 level: Secondary | ICD-10-CM | POA: Diagnosis not present

## 2020-01-01 DIAGNOSIS — F0151 Vascular dementia with behavioral disturbance: Secondary | ICD-10-CM | POA: Diagnosis not present

## 2020-01-01 DIAGNOSIS — M47812 Spondylosis without myelopathy or radiculopathy, cervical region: Secondary | ICD-10-CM | POA: Diagnosis not present

## 2020-01-01 DIAGNOSIS — M4312 Spondylolisthesis, cervical region: Secondary | ICD-10-CM | POA: Diagnosis not present

## 2020-01-01 DIAGNOSIS — I0981 Rheumatic heart failure: Secondary | ICD-10-CM | POA: Diagnosis not present

## 2020-01-01 DIAGNOSIS — I110 Hypertensive heart disease with heart failure: Secondary | ICD-10-CM | POA: Diagnosis not present

## 2020-01-01 DIAGNOSIS — G319 Degenerative disease of nervous system, unspecified: Secondary | ICD-10-CM | POA: Diagnosis not present

## 2020-01-01 DIAGNOSIS — I5032 Chronic diastolic (congestive) heart failure: Secondary | ICD-10-CM | POA: Diagnosis not present

## 2020-01-01 DIAGNOSIS — J479 Bronchiectasis, uncomplicated: Secondary | ICD-10-CM | POA: Diagnosis not present

## 2020-01-01 DIAGNOSIS — F05 Delirium due to known physiological condition: Secondary | ICD-10-CM | POA: Diagnosis not present

## 2020-01-01 DIAGNOSIS — Z96642 Presence of left artificial hip joint: Secondary | ICD-10-CM | POA: Diagnosis not present

## 2020-01-01 MED FILL — tiZANidine HCL 2 MG TABS: 2 | 5 days supply | Qty: 30 | Fill #0

## 2020-01-15 DIAGNOSIS — S72032D Displaced midcervical fracture of left femur, subsequent encounter for closed fracture with routine healing: Secondary | ICD-10-CM | POA: Diagnosis not present

## 2020-01-18 MED FILL — LEVOCETIRIZINE 5 MG TABLET: 5 | 30 days supply | Qty: 30 | Fill #4

## 2020-01-30 MED FILL — LATANOPROST 0.005% EYE DRP: 0.005 | 25 days supply | Qty: 3 | Fill #2

## 2020-02-08 MED FILL — SIMVASTATIN 20 MG TABLET: 20 | 90 days supply | Qty: 90 | Fill #3

## 2020-02-15 DIAGNOSIS — R413 Other amnesia: Secondary | ICD-10-CM | POA: Diagnosis not present

## 2020-02-15 DIAGNOSIS — R296 Repeated falls: Secondary | ICD-10-CM | POA: Diagnosis not present

## 2020-02-15 DIAGNOSIS — R2689 Other abnormalities of gait and mobility: Secondary | ICD-10-CM | POA: Diagnosis not present

## 2020-02-15 MED FILL — LEVOCETIRIZINE 5 MG TABLET: 5 | 30 days supply | Qty: 30 | Fill #5

## 2020-02-25 DIAGNOSIS — S72032D Displaced midcervical fracture of left femur, subsequent encounter for closed fracture with routine healing: Secondary | ICD-10-CM | POA: Diagnosis not present

## 2020-02-25 DIAGNOSIS — I110 Hypertensive heart disease with heart failure: Secondary | ICD-10-CM | POA: Diagnosis not present

## 2020-02-25 DIAGNOSIS — I2510 Atherosclerotic heart disease of native coronary artery without angina pectoris: Secondary | ICD-10-CM | POA: Diagnosis not present

## 2020-02-25 DIAGNOSIS — K5790 Diverticulosis of intestine, part unspecified, without perforation or abscess without bleeding: Secondary | ICD-10-CM | POA: Diagnosis not present

## 2020-02-25 DIAGNOSIS — D649 Anemia, unspecified: Secondary | ICD-10-CM | POA: Diagnosis not present

## 2020-02-25 DIAGNOSIS — R296 Repeated falls: Secondary | ICD-10-CM | POA: Diagnosis not present

## 2020-02-25 DIAGNOSIS — R911 Solitary pulmonary nodule: Secondary | ICD-10-CM | POA: Diagnosis not present

## 2020-02-25 DIAGNOSIS — M47812 Spondylosis without myelopathy or radiculopathy, cervical region: Secondary | ICD-10-CM | POA: Diagnosis not present

## 2020-02-25 DIAGNOSIS — E538 Deficiency of other specified B group vitamins: Secondary | ICD-10-CM | POA: Diagnosis not present

## 2020-02-25 DIAGNOSIS — E7800 Pure hypercholesterolemia, unspecified: Secondary | ICD-10-CM | POA: Diagnosis not present

## 2020-02-25 DIAGNOSIS — N400 Enlarged prostate without lower urinary tract symptoms: Secondary | ICD-10-CM | POA: Diagnosis not present

## 2020-02-25 DIAGNOSIS — M50321 Other cervical disc degeneration at C4-C5 level: Secondary | ICD-10-CM | POA: Diagnosis not present

## 2020-02-25 DIAGNOSIS — H6123 Impacted cerumen, bilateral: Secondary | ICD-10-CM | POA: Diagnosis not present

## 2020-02-25 DIAGNOSIS — F0390 Unspecified dementia without behavioral disturbance: Secondary | ICD-10-CM | POA: Diagnosis not present

## 2020-02-25 DIAGNOSIS — H409 Unspecified glaucoma: Secondary | ICD-10-CM | POA: Diagnosis not present

## 2020-02-25 DIAGNOSIS — E785 Hyperlipidemia, unspecified: Secondary | ICD-10-CM | POA: Diagnosis not present

## 2020-02-25 DIAGNOSIS — I5030 Unspecified diastolic (congestive) heart failure: Secondary | ICD-10-CM | POA: Diagnosis not present

## 2020-02-25 MED FILL — LATANOPROST 0.005% EYE DRP: 0.005 | 25 days supply | Qty: 3 | Fill #3

## 2020-03-06 ENCOUNTER — Other Ambulatory Visit (HOSPITAL_COMMUNITY): Payer: Self-pay | Admitting: Orthopedic Surgery

## 2020-03-06 MED FILL — CLINDAMYCIN HCL 300 MG CAPS: 300 | 5 days supply | Qty: 10 | Fill #0

## 2020-03-10 ENCOUNTER — Other Ambulatory Visit: Payer: Self-pay | Admitting: Cardiovascular Disease

## 2020-03-10 ENCOUNTER — Encounter: Payer: Self-pay | Admitting: Podiatry

## 2020-03-10 ENCOUNTER — Other Ambulatory Visit: Payer: Self-pay

## 2020-03-10 ENCOUNTER — Ambulatory Visit: Payer: Medicare Other | Admitting: Podiatry

## 2020-03-10 DIAGNOSIS — M79675 Pain in left toe(s): Secondary | ICD-10-CM | POA: Diagnosis not present

## 2020-03-10 DIAGNOSIS — B351 Tinea unguium: Secondary | ICD-10-CM

## 2020-03-10 DIAGNOSIS — M79674 Pain in right toe(s): Secondary | ICD-10-CM

## 2020-03-10 NOTE — Progress Notes (Signed)
.  This patient returns to the office for evaluation and treatment of long thick painful nails .  This patient is unable to trim his own nails since the patient cannot reach his feet.  Patient says the nails are painful walking and wearing his shoes. He presents to the office with his daughter. He returns for preventive foot care services.  General Appearance  Alert, conversant and in no acute stress.  Vascular  Dorsalis pedis and posterior tibial  pulses are palpable  bilaterally.  Capillary return is within normal limits  bilaterally. Temperature is within normal limits  bilaterally.  Neurologic  Senn-Weinstein monofilament wire test within normal limits  bilaterally. Muscle power within normal limits bilaterally.  Nails Thick disfigured discolored nails with subungual debris  from hallux to fifth toes bilaterally. No evidence of bacterial infection or drainage bilaterally.  Orthopedic  No limitations of motion  feet .  No crepitus or effusions noted.  No bony pathology or digital deformities noted.  Skin  normotropic skin with no porokeratosis noted bilaterally.  No signs of infections or ulcers noted.     Onychomycosis  Pain in toes right foot  Pain in toes left foot  Debridement  of nails  1-5  B/L with a nail nipper.  Nails were then filed using a dremel tool with no incidents.    RTC  4 months   Gardiner Barefoot DPM

## 2020-03-11 ENCOUNTER — Other Ambulatory Visit: Payer: Self-pay | Admitting: Cardiovascular Disease

## 2020-03-11 MED FILL — LISINOPRIL 10 MG TABS: 10 | 30 days supply | Qty: 30 | Fill #0

## 2020-03-17 ENCOUNTER — Other Ambulatory Visit (HOSPITAL_COMMUNITY): Payer: Self-pay | Admitting: Family Medicine

## 2020-03-17 MED FILL — tiZANidine HCL 2 MG TABS: 2 | 5 days supply | Qty: 30 | Fill #0

## 2020-03-19 ENCOUNTER — Ambulatory Visit: Payer: Medicare Other | Admitting: Adult Health

## 2020-03-19 VITALS — BP 166/64 | HR 57 | Ht 71.0 in | Wt 133.0 lb

## 2020-03-19 DIAGNOSIS — F0390 Unspecified dementia without behavioral disturbance: Secondary | ICD-10-CM | POA: Diagnosis not present

## 2020-03-19 NOTE — Patient Instructions (Signed)
Your Plan:  Consider seroquel 25 mg at bedtime If your symptoms worsen or you develop new symptoms please let us know.   Thank you for coming to see [place] at [facility]. I hope we have been able to provide you high quality care today.  You may receive a patient satisfaction survey over the next few weeks. We would appreciate your feedback and comments so that we may continue to improve ourselves and the health of our patients.  Quetiapine tablets What is this medicine? QUETIAPINE (kwe TYE a peen) is an antipsychotic. It is used to treat schizophrenia and bipolar disorder, also known as manic-depression. This medicine may be used for other purposes; ask your health care provider or pharmacist if you have questions. COMMON BRAND NAME(S): Seroquel What should I tell my health care provider before I take this medicine? They need to know if you have any of these conditions:  blockage in your bowel  cataracts  constipation  dementia  diabetes  difficulty swallowing  glaucoma  heart disease  high levels of prolactin  history of breast cancer  history of irregular heartbeat  liver disease  low blood counts, like low white cell, platelet, or red cell counts  low blood pressure  Parkinson's disease  prostate disease  seizures  suicidal thoughts, plans or attempt; a previous suicide attempt by you or a family member  thyroid disease  trouble passing urine  an unusual or allergic reaction to quetiapine, other medicines, foods, dyes, or preservatives  pregnant or trying to get pregnant  breast-feeding How should I use this medicine? Take this medicine by mouth. Swallow it with a drink of water. Follow the directions on the prescription label. If it upsets your stomach you can take it with food. Take your medicine at regular intervals. Do not take it more often than directed. Do not stop taking except on the advice of your doctor or health care  professional. A special MedGuide will be given to you by the pharmacist with each prescription and refill. Be sure to read this information carefully each time. Talk to your pediatrician regarding the use of this medicine in children. While this drug may be prescribed for children as young as 10 years for selected conditions, precautions do apply. Patients over age 71 years may have a stronger reaction to this medicine and need smaller doses. Overdosage: If you think you have taken too much of this medicine contact a poison control center or emergency room at once. NOTE: This medicine is only for you. Do not share this medicine with others. What if I miss a dose? If you miss a dose, take it as soon as you can. If it is almost time for your next dose, take only that dose. Do not take double or extra doses. What may interact with this medicine? Do not take this medicine with any of the following medications:  cisapride  dronedarone  metoclopramide  pimozide  thioridazine This medicine may also interact with the following medications:  alcohol  antihistamines for allergy, cough, and cold  atropine  avasimibe  certain antivirals for HIV or hepatitis  certain medicines for anxiety or sleep  certain medicines for bladder problems like oxybutynin, tolterodine  certain medicines for depression like amitriptyline, fluoxetine, nefazodone, sertraline  certain medicines for fungal infections like fluconazole, ketoconazole, itraconazole, posaconazole  certain medicines for stomach problems like dicyclomine, hyoscyamine  certain medicines for travel sickness like scopolamine  cimetidine  general anesthetics like halothane, isoflurane, methoxyflurane, propofol  ipratropium  levodopa or other medicines for Parkinson's disease  medicines for blood pressure  medicines for seizures  medicines that relax muscles for surgery  narcotic medicines for pain  other medicines that  prolong the QT interval (cause an abnormal heart rhythm)  phenothiazines like chlorpromazine, prochlorperazine  rifampin  St. John's wort This list may not describe all possible interactions. Give your health care provider a list of all the medicines, herbs, non-prescription drugs, or dietary supplements you use. Also tell them if you smoke, drink alcohol, or use illegal drugs. Some items may interact with your medicine. What should I watch for while using this medicine? Visit your health care professional for regular checks on your progress. Tell your health care professional if symptoms do not start to get better or if they get worse. Do not stop taking except on your health care professional's advice. You may develop a severe reaction. Your health care professional will tell you how much medicine to take. You may need to have an eye exam before and during use of this medicine. This medicine may increase blood sugar. Ask your health care provider if changes in diet or medicines are needed if you have diabetes. Patients and their families should watch out for new or worsening depression or thoughts of suicide. Also watch out for sudden or severe changes in feelings such as feeling anxious, agitated, panicky, irritable, hostile, aggressive, impulsive, severely restless, overly excited and hyperactive, or not being able to sleep. If this happens, especially at the beginning of antidepressant treatment or after a change in dose, call your health care professional. Dennis Bast may get dizzy or drowsy. Do not drive, use machinery, or do anything that needs mental alertness until you know how this medicine affects you. Do not stand or sit up quickly, especially if you are an older patient. This reduces the risk of dizzy or fainting spells. Alcohol may interfere with the effect of this medicine. Avoid alcoholic drinks. This drug can cause problems with controlling your body temperature. It can lower the response of  your body to cold temperatures. If possible, stay indoors during cold weather. If you must go outdoors, wear warm clothes. It can also lower the response of your body to heat. Do not overheat. Do not over-exercise. Stay out of the sun when possible. If you must be in the sun, wear cool clothing. Drink plenty of water. If you have trouble controlling your body temperature, call your health care provider right away. What side effects may I notice from receiving this medicine? Side effects that you should report to your doctor or health care professional as soon as possible:  allergic reactions like skin rash, itching or hives, swelling of the face, lips, or tongue  breathing problems  changes in vision  confusion  elevated mood, decreased need for sleep, racing thoughts, impulsive behavior  eye pain  fast, irregular heartbeat  fever or chills, sore throat  inability to keep still  males: prolonged or painful erection  problems with balance, talking, walking  redness, blistering, peeling, or loosening of the skin, including inside the mouth  seizures  signs and symptoms of high blood sugar such as being more thirsty or hungry or having to urinate more than normal. You may also feel very tired or have blurry vision  signs and symptoms of hypothyroidism like fatigue; increased sensitivity to cold; weight gain; hoarseness; thinning hair  signs and symptoms of low blood pressure like dizziness; feeling faint or lightheaded; falls; unusually weak  or tired  signs and symptoms of neuroleptic malignant syndrome like confusion; fast, irregular heartbeat; high fever; increased sweating; stiff muscles  sudden numbness or weakness of the face, arm, or leg  suicidal thoughts or other mood changes  trouble swallowing  uncontrollable movements of the arms, face, head, mouth, neck, or upper body Side effects that usually do not require medical attention (report to your doctor or health care  professional if they continue or are bothersome):  change in sex drive or performance  constipation  drowsiness  dry mouth  upset stomach  weight gain This list may not describe all possible side effects. Call your doctor for medical advice about side effects. You may report side effects to FDA at 1-800-FDA-1088. Where should I keep my medicine? Keep out of the reach of children. Store at room temperature between 15 and 30 degrees C (59 and 86 degrees F). Throw away any unused medicine after the expiration date. NOTE: This sheet is a summary. It may not cover all possible information. If you have questions about this medicine, talk to your doctor, pharmacist, or health care provider.  2021 Elsevier/Gold Standard (2018-11-15 14:42:48)
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
## 2020-03-27 DIAGNOSIS — N400 Enlarged prostate without lower urinary tract symptoms: Secondary | ICD-10-CM | POA: Diagnosis not present

## 2020-03-27 DIAGNOSIS — I110 Hypertensive heart disease with heart failure: Secondary | ICD-10-CM | POA: Diagnosis not present

## 2020-03-27 DIAGNOSIS — H409 Unspecified glaucoma: Secondary | ICD-10-CM | POA: Diagnosis not present

## 2020-03-27 DIAGNOSIS — E785 Hyperlipidemia, unspecified: Secondary | ICD-10-CM | POA: Diagnosis not present

## 2020-03-27 DIAGNOSIS — K5790 Diverticulosis of intestine, part unspecified, without perforation or abscess without bleeding: Secondary | ICD-10-CM | POA: Diagnosis not present

## 2020-03-27 DIAGNOSIS — I2510 Atherosclerotic heart disease of native coronary artery without angina pectoris: Secondary | ICD-10-CM | POA: Diagnosis not present

## 2020-03-27 DIAGNOSIS — D649 Anemia, unspecified: Secondary | ICD-10-CM | POA: Diagnosis not present

## 2020-03-27 DIAGNOSIS — R911 Solitary pulmonary nodule: Secondary | ICD-10-CM | POA: Diagnosis not present

## 2020-03-27 DIAGNOSIS — M50321 Other cervical disc degeneration at C4-C5 level: Secondary | ICD-10-CM | POA: Diagnosis not present

## 2020-03-27 DIAGNOSIS — F0390 Unspecified dementia without behavioral disturbance: Secondary | ICD-10-CM | POA: Diagnosis not present

## 2020-03-27 DIAGNOSIS — I5030 Unspecified diastolic (congestive) heart failure: Secondary | ICD-10-CM | POA: Diagnosis not present

## 2020-03-27 DIAGNOSIS — M47812 Spondylosis without myelopathy or radiculopathy, cervical region: Secondary | ICD-10-CM | POA: Diagnosis not present

## 2020-03-27 DIAGNOSIS — E538 Deficiency of other specified B group vitamins: Secondary | ICD-10-CM | POA: Diagnosis not present

## 2020-03-27 DIAGNOSIS — E7800 Pure hypercholesterolemia, unspecified: Secondary | ICD-10-CM | POA: Diagnosis not present

## 2020-03-27 DIAGNOSIS — S72032D Displaced midcervical fracture of left femur, subsequent encounter for closed fracture with routine healing: Secondary | ICD-10-CM | POA: Diagnosis not present

## 2020-03-27 DIAGNOSIS — R296 Repeated falls: Secondary | ICD-10-CM | POA: Diagnosis not present

## 2020-04-01 ENCOUNTER — Encounter: Payer: Self-pay | Admitting: Adult Health

## 2020-04-04 ENCOUNTER — Other Ambulatory Visit (HOSPITAL_COMMUNITY): Payer: Self-pay | Admitting: Family Medicine

## 2020-04-04 ENCOUNTER — Telehealth: Payer: Self-pay | Admitting: Cardiovascular Disease

## 2020-04-04 MED ORDER — LISINOPRIL 10 MG PO TABS
10.0000 mg | ORAL_TABLET | Freq: Every day | ORAL | 0 refills | Status: DC
Start: 2020-04-04 — End: 2020-04-04

## 2020-04-04 MED FILL — traZODone HCL 50 MG TABS: 50 | 30 days supply | Qty: 30 | Fill #0

## 2020-04-04 MED FILL — LATANOPROST 0.005% EYE DRP: 0.005 | 25 days supply | Qty: 3 | Fill #4

## 2020-04-04 NOTE — Telephone Encounter (Signed)
Pt's medication was sent to pt's pharmacy as requested. Confirmation received.  °

## 2020-04-04 NOTE — Telephone Encounter (Signed)
   *  STAT* If patient is at the pharmacy, call can be transferred to refill team.   1. Which medications need to be refilled? (please list name of each medication and dose if known) lisinopril (ZESTRIL) 10 MG tablet  2. Which pharmacy/location (including street and city if local pharmacy) is medication to be sent to? [place], [place].  3. Do they need a 30 day or 90 day supply? 90 days

## 2020-04-07 ENCOUNTER — Other Ambulatory Visit: Payer: Self-pay | Admitting: Adult Health

## 2020-04-07 DIAGNOSIS — L8990 Pressure ulcer of unspecified site, unspecified stage: Secondary | ICD-10-CM | POA: Diagnosis not present

## 2020-04-07 DIAGNOSIS — F0390 Unspecified dementia without behavioral disturbance: Secondary | ICD-10-CM | POA: Diagnosis not present

## 2020-04-07 DIAGNOSIS — R350 Frequency of micturition: Secondary | ICD-10-CM | POA: Diagnosis not present

## 2020-04-07 DIAGNOSIS — R296 Repeated falls: Secondary | ICD-10-CM | POA: Diagnosis not present

## 2020-04-07 MED ORDER — TRAZODONE HCL 50 MG PO TABS: 25.0000 mg | ORAL_TABLET | Freq: Every day | ORAL | 3 refills | Status: DC

## 2020-04-14 ENCOUNTER — Other Ambulatory Visit (HOSPITAL_COMMUNITY): Payer: Self-pay

## 2020-04-14 MED FILL — Lisinopril Tab 10 MG: ORAL | 90 days supply | Qty: 90 | Fill #0 | Status: AC

## 2020-04-24 DIAGNOSIS — Z111 Encounter for screening for respiratory tuberculosis: Secondary | ICD-10-CM | POA: Diagnosis not present

## 2020-04-24 DIAGNOSIS — F0390 Unspecified dementia without behavioral disturbance: Secondary | ICD-10-CM | POA: Diagnosis not present

## 2020-04-24 DIAGNOSIS — Z20822 Contact with and (suspected) exposure to covid-19: Secondary | ICD-10-CM | POA: Diagnosis not present

## 2020-04-24 DIAGNOSIS — R2689 Other abnormalities of gait and mobility: Secondary | ICD-10-CM | POA: Diagnosis not present

## 2020-04-24 DIAGNOSIS — L8990 Pressure ulcer of unspecified site, unspecified stage: Secondary | ICD-10-CM | POA: Diagnosis not present

## 2020-05-01 ENCOUNTER — Encounter (HOSPITAL_BASED_OUTPATIENT_CLINIC_OR_DEPARTMENT_OTHER): Payer: Self-pay | Admitting: Emergency Medicine

## 2020-05-01 ENCOUNTER — Emergency Department (HOSPITAL_BASED_OUTPATIENT_CLINIC_OR_DEPARTMENT_OTHER): Payer: Medicare Other

## 2020-05-01 ENCOUNTER — Other Ambulatory Visit: Payer: Self-pay

## 2020-05-01 ENCOUNTER — Emergency Department (HOSPITAL_BASED_OUTPATIENT_CLINIC_OR_DEPARTMENT_OTHER): Payer: Medicare Other | Admitting: Radiology

## 2020-05-01 ENCOUNTER — Emergency Department (HOSPITAL_BASED_OUTPATIENT_CLINIC_OR_DEPARTMENT_OTHER)
Admission: EM | Admit: 2020-05-01 | Discharge: 2020-05-01 | Disposition: A | Discharge status: Home / Self Care | Payer: Medicare Other | Attending: Emergency Medicine | Admitting: Emergency Medicine

## 2020-05-01 DIAGNOSIS — I25111 Atherosclerotic heart disease of native coronary artery with angina pectoris with documented spasm: Secondary | ICD-10-CM | POA: Diagnosis not present

## 2020-05-01 DIAGNOSIS — R7401 Elevation of levels of liver transaminase levels: Secondary | ICD-10-CM | POA: Insufficient documentation

## 2020-05-01 DIAGNOSIS — Z85828 Personal history of other malignant neoplasm of skin: Secondary | ICD-10-CM | POA: Diagnosis not present

## 2020-05-01 DIAGNOSIS — R748 Abnormal levels of other serum enzymes: Secondary | ICD-10-CM | POA: Diagnosis not present

## 2020-05-01 DIAGNOSIS — Z043 Encounter for examination and observation following other accident: Secondary | ICD-10-CM | POA: Diagnosis not present

## 2020-05-01 DIAGNOSIS — M7989 Other specified soft tissue disorders: Secondary | ICD-10-CM | POA: Diagnosis not present

## 2020-05-01 DIAGNOSIS — Z79899 Other long term (current) drug therapy: Secondary | ICD-10-CM | POA: Insufficient documentation

## 2020-05-01 DIAGNOSIS — Z96642 Presence of left artificial hip joint: Secondary | ICD-10-CM | POA: Diagnosis not present

## 2020-05-01 DIAGNOSIS — Z8551 Personal history of malignant neoplasm of bladder: Secondary | ICD-10-CM | POA: Insufficient documentation

## 2020-05-01 DIAGNOSIS — S0990XA Unspecified injury of head, initial encounter: Secondary | ICD-10-CM | POA: Diagnosis not present

## 2020-05-01 DIAGNOSIS — R001 Bradycardia, unspecified: Secondary | ICD-10-CM | POA: Diagnosis not present

## 2020-05-01 DIAGNOSIS — S3210XA Unspecified fracture of sacrum, initial encounter for closed fracture: Secondary | ICD-10-CM

## 2020-05-01 DIAGNOSIS — M549 Dorsalgia, unspecified: Secondary | ICD-10-CM | POA: Diagnosis not present

## 2020-05-01 DIAGNOSIS — M25572 Pain in left ankle and joints of left foot: Secondary | ICD-10-CM | POA: Diagnosis not present

## 2020-05-01 DIAGNOSIS — Z87891 Personal history of nicotine dependence: Secondary | ICD-10-CM | POA: Insufficient documentation

## 2020-05-01 DIAGNOSIS — Y92129 Unspecified place in nursing home as the place of occurrence of the external cause: Secondary | ICD-10-CM | POA: Diagnosis not present

## 2020-05-01 DIAGNOSIS — F0151 Vascular dementia with behavioral disturbance: Secondary | ICD-10-CM | POA: Insufficient documentation

## 2020-05-01 DIAGNOSIS — S3993XA Unspecified injury of pelvis, initial encounter: Secondary | ICD-10-CM | POA: Diagnosis present

## 2020-05-01 DIAGNOSIS — R404 Transient alteration of awareness: Secondary | ICD-10-CM | POA: Diagnosis not present

## 2020-05-01 DIAGNOSIS — W19XXXA Unspecified fall, initial encounter: Secondary | ICD-10-CM | POA: Insufficient documentation

## 2020-05-01 DIAGNOSIS — K7689 Other specified diseases of liver: Secondary | ICD-10-CM

## 2020-05-01 DIAGNOSIS — I10 Essential (primary) hypertension: Secondary | ICD-10-CM | POA: Insufficient documentation

## 2020-05-01 HISTORY — DX: Other specified diseases of liver: K76.89

## 2020-05-01 LAB — BASIC METABOLIC PANEL
Anion gap: 8 (ref 5–15)
BUN: 21 mg/dL (ref 8–23)
CO2: 28 mmol/L (ref 22–32)
Calcium: 9.1 mg/dL (ref 8.9–10.3)
Chloride: 105 mmol/L (ref 98–111)
Creatinine, Ser: 0.73 mg/dL (ref 0.61–1.24)
GFR, Estimated: >60 mL/min (ref >60)
Glucose, Bld: 102 mg/dL — ABNORMAL HIGH (ref 70–99)
Potassium: 3.5 mmol/L (ref 3.5–5.1)
Sodium: 141 mmol/L (ref 135–145)

## 2020-05-01 LAB — APTT: aPTT: 30 seconds (ref 24–36)

## 2020-05-01 LAB — PROTIME-INR
INR: 0.9 (ref 0.8–1.2)
Prothrombin Time: 12.4 seconds (ref 11.4–15.2)

## 2020-05-01 LAB — CBC
HCT: 34.7 % — ABNORMAL LOW (ref 39.0–52.0)
Hemoglobin: 10.9 g/dL — ABNORMAL LOW (ref 13.0–17.0)
MCH: 28.6 pg (ref 26.0–34.0)
MCHC: 31.4 g/dL (ref 30.0–36.0)
MCV: 91.1 fL (ref 80.0–100.0)
Platelets: 350 10*3/uL (ref 150–400)
RBC: 3.81 MIL/uL — ABNORMAL LOW (ref 4.22–5.81)
RDW: 14.5 % (ref 11.5–15.5)
WBC: 6.5 10*3/uL (ref 4.0–10.5)
nRBC: 0 % (ref 0.0–0.2)

## 2020-05-01 LAB — HEPATIC FUNCTION PANEL
ALT: 322 U/L — ABNORMAL HIGH (ref 0–44)
AST: 152 U/L — ABNORMAL HIGH (ref 15–41)
Albumin: 3.3 g/dL — ABNORMAL LOW (ref 3.5–5.0)
Alkaline Phosphatase: 590 U/L — ABNORMAL HIGH (ref 38–126)
Bilirubin, Direct: 0.8 mg/dL — ABNORMAL HIGH (ref 0.0–0.2)
Indirect Bilirubin: 0.7 mg/dL (ref 0.3–0.9)
Total Bilirubin: 1.5 mg/dL — ABNORMAL HIGH (ref 0.3–1.2)
Total Protein: 6 g/dL — ABNORMAL LOW (ref 6.5–8.1)

## 2020-05-01 LAB — AMMONIA: Ammonia: 16 umol/L (ref 9–35)

## 2020-05-01 MED ORDER — IOHEXOL 300 MG/ML  SOLN
80.0000 mL | Freq: Once | INTRAMUSCULAR | Status: AC | PRN
  Administered 2020-05-01: 80 mL via INTRAVENOUS

## 2020-05-01 NOTE — Discharge Instructions (Addendum)
Stop taking your Zocor.  Take over-the-counter medications as needed for pain.  Try taking over-the-counter ibuprofen or naproxen as needed for pain.  I spoke with Dr. Virgilio Belling office and they should be ordering the outpatient MRI test.  Follow-up with Dr. Niel Hummer for further evaluation after that is done

## 2020-05-01 NOTE — ED Triage Notes (Signed)
Unwitnessed fall at the facility , ankle pain per ems per facility, unsure R or L. Hx demantia. Pt denies pain .

## 2020-05-01 NOTE — ED Notes (Signed)
Spoke with lab to add on hepatic function panel

## 2020-05-01 NOTE — ED Provider Notes (Signed)
[facility] EMERGENCY DEPT Provider Note   CSN: [id number] Arrival date & time: 05/01/20  0741   Level 5 caveat: Dementia  History Chief Complaint  Patient presents with  . Fall    Jesus Harmon is a 87 y.o. male.  HPI   Patient presented to the emergency room for evaluation after a witnessed fall.  Patient is a resident of [facility] nursing facility.  According to the EMS report he had an unwitnessed fall this morning.  The facility indicated that he was complaining of some ankle pain.  They were not sure which side.  Patient does have history of dementia.  In the ED patient provides very limited information.  He will answer to his name but he is not answering any questions.  Patient is unable to tell me whether he fell this morning.  He is unable to tell me whether he is having any pain.  Past Medical History:  Diagnosis Date  . Arthritis    back and neck (06/16/2016)  . Bladder cancer ([place])   . Blockage of coronary artery of heart (HCC)    "widow maker", had stenting of the R side but L main is still blocked   . CAD (coronary artery disease)    a. LHC 10/2009:  pLAD 50%, small D1 70-80% (too small for PCI), EF 65% => med Rx  b. Myoview 10/2009: EF 65%, no ischemia;  c.  ETT-MV 3/14:  No ischemia, EF 63%  . Cancer [facility]    sin cancer removed nose   . Diverticulosis   . Glaucoma   . History of echocardiogram    Echo 02/2018: EF 81-85, +diastolic dysfunction, no RWMA, normal RVSF, trivial MR  . History of kidney stones    "passed them"  . HLD (hyperlipidemia)    "on RX; never had high cholestrol" (06/16/2016)  . HTN (hypertension)    "on RX; never HTN" (06/16/2016)  . Migraine    "in high school" (06/16/2016)    Patient Active Problem List   Diagnosis Date Noted  . Dementia with behavioral disturbance ([place]) 11/24/2019  . Closed displaced fracture of left femoral neck ([place])   . B12 deficiency 11/23/2019  . Anemia 11/23/2019  . Pulmonary nodule, right 11/23/2019   . Hip fracture ([place]) 11/22/2019  . Pain due to onychomycosis of toenails of both feet 11/05/2019  . Vascular dementia with behavior disturbance ([place]) 10/17/2019  . Dementia without behavioral disturbance ([place]) 05/13/2019  . Bilateral impacted cerumen 11/29/2018  . Presbycusis of both ears 11/29/2018  . Vasomotor rhinitis 11/29/2018  . S/P inguinal hernia repair 12/22/2016  . CAD (coronary artery disease) 06/16/2016  . Coronary artery disease involving native coronary artery of native heart with angina pectoris ([place]) 06/16/2016  . Benign hypertrophy of prostate 03/04/2014  . Hyperlipidemia 02/17/2010  . Essential hypertension 02/17/2010  . CAD, NATIVE VESSEL  (06/16/2016): a. sev sten of mid right coronary art PTCA and DES , severe prox LAD stenosis unsucc PTCA, widely patent left main and left circ 11/14/2009  . FATIGUE 11/12/2009  . ARM NUMBNESS 11/12/2009  . DYSPNEA 11/12/2009    Past Surgical History:  Procedure Laterality Date  . BASAL CELL CARCINOMA EXCISION     "one of my ears; big one on my nose; one on my left shoulder" (06/16/2016)  . CARDIAC CATHETERIZATION  2017   "treated w/RX"  . CATARACT EXTRACTION W/ INTRAOCULAR LENS  IMPLANT, BILATERAL Bilateral   . CORONARY ANGIOPLASTY WITH STENT PLACEMENT  06/16/2016  .  CORONARY BALLOON ANGIOPLASTY N/A 06/16/2016   Procedure: Coronary Balloon Angioplasty;  Surgeon: Sherren Mocha, MD;  Location: [place];  Service: Cardiovascular;  Laterality: N/A;  Prox LAD  . CORONARY STENT INTERVENTION  06/16/2016   Procedure: Coronary Stent Intervention;  Surgeon: Sherren Mocha, MD;  Location: [place] [facility];  Service: Cardiovascular;;  Synergy 4.0x16 to Mid RCA  . INGUINAL HERNIA REPAIR Left 12/22/2016   Procedure: REPAIR OF LEFT INGUINAL HERNIA WITH MESH;  Surgeon: Georganna Skeans, MD;  Location: [facility];  Service: General;  Laterality: Left;  . INGUINAL HERNIA REPAIR Left 10/09/2019   Procedure: LAPAROSCOPIC LEFT INGUINAL HERNIA  REPAIR WITH MESH;  Surgeon: Ralene Ok, MD;  Location: [facility];  Service: General;  Laterality: Left;  . INSERTION OF MESH Left 12/22/2016   Procedure: INSERTION OF MESH;  Surgeon: Georganna Skeans, MD;  Location: [facility];  Service: General;  Laterality: Left;  . INTRAVASCULAR PRESSURE WIRE/FFR STUDY N/A 06/16/2016   Procedure: Intravascular Pressure Wire/FFR Study;  Surgeon: Sherren Mocha, MD;  Location: [place] [facility];  Service: Cardiovascular;  Laterality: N/A;  Prox LAD  . KNEE ARTHROSCOPY     "not sure which side"  . LEFT HEART CATH AND CORONARY ANGIOGRAPHY N/A 06/16/2016   Procedure: Left Heart Cath and Coronary Angiography;  Surgeon: Sherren Mocha, MD;  Location: [place] [facility];  Service: Cardiovascular;  Laterality: N/A;  . TONSILLECTOMY    . TOTAL HIP ARTHROPLASTY Left 11/23/2019   Procedure: TOTAL HIP ARTHROPLASTY ANTERIOR APPROACH;  Surgeon: Rod Can, MD;  Location: [facility];  Service: Orthopedics;  Laterality: Left;  . TRANSURETHRAL RESECTION OF PROSTATE N/A 03/04/2014   Procedure: TRANSURETHRAL RESECTION OF THE PROSTATE, TRANSURETHRAL RESECTION OF THE BLADDER TUMOR;  Surgeon: Ailene Rud, MD;  Location: [facility];  Service: Urology;  Laterality: N/A;       Family History  Problem Relation Age of Onset  . Sudden death Mother        sudden cardiac arrest  . COPD Father   . Heart failure Father        CHF  . Emphysema Father   . Parkinson's disease Maternal Uncle   . Dementia Neg Hx   . Alzheimer's disease Neg Hx     Social History   Tobacco Use  . Smoking status: Former Smoker    Packs/day: 1.00    Years: 18.00    Pack years: 18.00    Quit date: 1963    Years since quitting: 59.3  . Smokeless tobacco: Never Used  Vaping Use  . Vaping Use: Never used  Substance Use Topics  . Alcohol use: Never  . Drug use: Never    Home Medications Prior to Admission medications   Medication Sig Start Date End Date Taking? Authorizing Provider   acetaminophen (TYLENOL) 500 MG tablet Take 2 tablets (1,000 mg total) by mouth every 8 (eight) hours as needed for moderate pain. 11/28/19   Elodia Florence., MD  clindamycin (CLEOCIN) 300 MG capsule TAKE 2 CAPSULE(S) 1-2 HOURS PRIOR TO DENTAL WORK BY MOUTH 03/06/20 03/06/21  Swinteck, Aaron Edelman, MD  docusate sodium (COLACE) 100 MG capsule Take 1 capsule (100 mg total) by mouth 2 (two) times daily. 11/27/19   Eugenie Filler, MD  latanoprost (XALATAN) 0.005 % ophthalmic solution Place 1 drop into both eyes at bedtime.    [provider]  latanoprost (XALATAN) 0.005 % ophthalmic solution PLACE 1 DROP INTO BOTH EYES NIGHTLY. 10/15/19 10/14/20  Rutherford Guys, MD  levocetirizine (XYZAL) 5 MG tablet Take 5 mg by mouth every evening.     [provider]  levocetirizine (XYZAL) 5 MG tablet TAKE 1 TABLET BY MOUTH EVERY EVENING 03/12/19 03/11/20  Mosetta Anis, MD  lisinopril (ZESTRIL) 10 MG tablet TAKE 1 TABLET (10 MG TOTAL) BY MOUTH DAILY. PLEASE KEEP UPCOMING APPT IN MAY 2022 BEFORE ANYMORE REFILLS. [facility] YOU 04/04/20 04/04/21  Sherren Mocha, MD  nitroGLYCERIN (NITROSTAT) 0.4 MG SL tablet PLACE 1 TABLET UNDER THE TONGUE EVERY 5 MINUTES AS NEEDED FOR CHEST PAIN. Patient taking differently: Place 0.4 mg under the tongue every 5 (five) minutes x 3 doses as needed for chest pain. 05/10/19   Sherren Mocha, MD  polyethylene glycol (MIRALAX / GLYCOLAX) 17 g packet Take 17 g by mouth daily as needed for mild constipation. 11/27/19   Eugenie Filler, MD  simvastatin (ZOCOR) 20 MG tablet TAKE 1 TABLET BY MOUTH DAILY. Patient taking differently: Take 20 mg by mouth at bedtime. 05/22/19 05/21/20  Sherren Mocha, MD  tiZANidine (ZANAFLEX) 2 MG tablet Take 2 mg by mouth daily as needed for muscle spasms.  10/03/19   [provider]  tiZANidine (ZANAFLEX) 2 MG tablet TAKE 1 TO 2 TABLETS BY MOUTH EVERY 8 HOURS AS NEEDED FOR MUSCLE SPASMS 03/17/20 03/17/21  Mitchell, L.Marlou Sa, MD  tiZANidine  (ZANAFLEX) 2 MG tablet TAKE 1 TO 2 TABLETS BY MOUTH EVERY 8 HOURS AS NEEDED FOR MUSCLE SPASMS 12/24/19 12/23/20  Alroy Dust, L.Marlou Sa, MD  traZODone (DESYREL) 50 MG tablet TAKE 0.5 TABLETS (25 MG TOTAL) BY MOUTH AT BEDTIME. 04/07/20 04/07/21  Ward Givens, NP  traZODone (DESYREL) 50 MG tablet TAKE 1 TABLET BY MOUTH 2 HOURS BEFORE BEDTIME 04/04/20 04/04/21  Alroy Dust, L.Marlou Sa, MD  vitamin B-12 (CYANOCOBALAMIN) 1000 MCG tablet Take 1,000 mcg by mouth daily.    [provider]  memantine (NAMENDA) 10 MG tablet Start with one pill in the morning. In 2 weeks can increase to one pill twice daily.Stop for any side effects. Patient not taking: Reported on 11/22/2019 10/17/19 11/28/19  Melvenia Beam, MD    Allergies    Amoxicillin, Sulfonamide derivatives, and Sulfamethoxazole  Review of Systems   Review of Systems  Unable to perform ROS: Dementia    Physical Exam Updated Vital Signs BP 133/62   Pulse (!) 59   Temp 97.8 F (36.6 C) (Oral)   Resp 19   SpO2 100%   Physical Exam Vitals and nursing note reviewed.  Constitutional:      General: He is not in acute distress.    Appearance: He is well-developed. He is not diaphoretic.     Comments: Elderly, frail  HENT:     Head: Normocephalic and atraumatic.     Right Ear: External ear normal.     Left Ear: External ear normal.  Eyes:     General: No scleral icterus.       Right eye: No discharge.        Left eye: No discharge.     Conjunctiva/sclera: Conjunctivae normal.  Neck:     Trachea: No tracheal deviation.  Cardiovascular:     Rate and Rhythm: Normal rate and regular rhythm.  Pulmonary:     Effort: Pulmonary effort is normal. No respiratory distress.     Breath sounds: Normal breath sounds. No stridor. No wheezing or rales.  Abdominal:     General: Bowel sounds are normal. There is no distension.     Palpations: Abdomen is soft.  Tenderness: There is no abdominal tenderness. There is no guarding or rebound.   Musculoskeletal:        General: No tenderness.     Cervical back: Neck supple. No tenderness.     Thoracic back: No tenderness.     Lumbar back: No tenderness.     Comments: Bilateral upper extremities palpated and placed through full range of motion, patient without any apparent pain or tenderness; bilateral lower extremities palpated and placed through full range of motion, no deformity, no discrete tenderness, patient did grimace slightly once as I was moving his left leg but I am unable to determine if that was more his hip or ankle, he has no focal tenderness to either area  Skin:    General: Skin is warm and dry.     Findings: No rash.  Neurological:     Cranial Nerves: No cranial nerve deficit (no facial droop, extraocular movements intact, no slurred speech).     Sensory: No sensory deficit.     Motor: No abnormal muscle tone or seizure activity.     Coordination: Coordination normal.     ED Results / Procedures / Treatments   Labs (all labs ordered are listed, but only abnormal results are displayed) Labs Reviewed  CBC - Abnormal; Notable for the following components:      Result Value   RBC 3.81 (*)    Hemoglobin 10.9 (*)    HCT 34.7 (*)    All other components within normal limits  BASIC METABOLIC PANEL - Abnormal; Notable for the following components:   Glucose, Bld 102 (*)    All other components within normal limits  HEPATIC FUNCTION PANEL - Abnormal; Notable for the following components:   Total Protein 6.0 (*)    Albumin 3.3 (*)    AST 152 (*)    ALT 322 (*)    Alkaline Phosphatase 590 (*)    Total Bilirubin 1.5 (*)    Bilirubin, Direct 0.8 (*)    All other components within normal limits  PROTIME-INR  APTT  AMMONIA  AMMONIA    EKG EKG Interpretation  Date/Time:  Thursday May 01 2020 08:07:14 EDT Ventricular Rate:  42 PR Interval:  188 QRS Duration: 98 QT Interval:  428 QTC Calculation: 358 R Axis:   31 Text Interpretation: Sinus bradycardia  Atrial premature complexes in couplets Nonspecific T abnrm, anterolateral leads Baseline wander in lead(s) V3 Confirmed by Dorie Rank [phone]) on 05/01/2020 8:11:02 AM   Radiology DG Ankle Complete Left  Result Date: 05/01/2020 CLINICAL DATA:  Unwitnessed fall at facility EXAM: LEFT ANKLE COMPLETE - 3+ VIEW COMPARISON:  04/16/2006 FINDINGS: Soft tissue swelling LEFT ankle greater laterally. Mild osseous demineralization. Joint spaces preserved. Vague oblique lucency at distal LEFT fibula secondary to old healed fracture, was acute on previous exam. No acute fracture or dislocation. Questionable 2.5 x 2.3 cm lucency at the posterior calcaneus versus osseous demineralization, new since previous exam. IMPRESSION: Old healed fracture distal LEFT fibula. No acute fracture or dislocation. Bony demineralization versus 2.5 cm diameter lucent/lytic focus at posterior calcaneus; if clinically indicated based upon patient age/clinical condition, consider dedicated calcaneal radiographs. Electronically Signed   By: Lavonia Dana M.D.   On: 05/01/2020 09:07   DG Os Calcis Left  Result Date: 05/01/2020 CLINICAL DATA:  [place] today, question lytic lesion in calcaneus on ankle radiographs EXAM: LEFT OS CALCIS - 2+ VIEW COMPARISON:  Ankle radiographs 05/01/2020 FINDINGS: Mild generalized lucency in calcaneus, greater posteriorly, consistent with osseous  demineralization. This is much less distinct than on the preceding ankle exam. No fracture, dislocation or bone destruction identified. Specifically, no lytic lesion is seen. IMPRESSION: No acute calcaneal abnormalities. Area of lucency at the posterior calcaneus on the previous exam is likely due to mild osseous demineralization. Electronically Signed   By: Lavonia Dana M.D.   On: 05/01/2020 10:06   CT Head Wo Contrast  Result Date: 05/01/2020 CLINICAL DATA:  Minor head trauma from fall today. EXAM: CT HEAD WITHOUT CONTRAST TECHNIQUE: Contiguous axial images were obtained  from the base of the skull through the vertex without intravenous contrast. COMPARISON:  11/22/2019 FINDINGS: Brain: No significant change in mild-to-moderate enlargement of the ventricles and subarachnoid spaces. Stable marked patchy white matter low density in both cerebral hemispheres. No intracranial hemorrhage, mass lesion or CT evidence of acute infarction. Vascular: No hyperdense vessel or unexpected calcification. Skull: Normal. Negative for fracture or focal lesion. Sinuses/Orbits: Status post bilateral cataract extraction. Unremarkable bones and included paranasal sinuses. Other: None. IMPRESSION: 1. No skull fracture or intracranial hemorrhage. 2. Stable mild-to-moderate diffuse cerebral and cerebellar atrophy and marked chronic small vessel white matter ischemic changes in both cerebral hemispheres. Electronically Signed   By: Claudie Revering M.D.   On: 05/01/2020 08:41   CT ABDOMEN PELVIS W CONTRAST  Result Date: 05/01/2020 CLINICAL DATA:  Unwitnessed fall, history of bladder cancer and hypertension. Abnormal elevated liver enzymes and hyperbilirubinemia with reduced protein and albumin. EXAM: CT ABDOMEN AND PELVIS WITH CONTRAST TECHNIQUE: Multidetector CT imaging of the abdomen and pelvis was performed using the standard protocol following bolus administration of intravenous contrast. CONTRAST:  51mL OMNIPAQUE IOHEXOL 300 MG/ML  SOLN COMPARISON:  None. FINDINGS: Lower chest: Dependent subsegmental atelectasis in the left lower lobe. Descending thoracic aortic and right coronary artery atherosclerotic calcification. Hepatobiliary: Borderline gallbladder wall thickening. Common bile duct measures up to 1.2 cm in diameter, mildly prominent, although some of this may be from subsequent extrinsic narrowing related to adjacent vasculature on image 59 of series 5. No substantial degree of intrahepatic biliary dilatation. Complex lesion along the posterior margin of the right hepatic lobe measuring 3.5 by 2.6  by 3.2 cm, with mostly low-density both with some heterogeneous internal accentuated density particularly anteriorly. This appears to have further increase in density on delayed phase images, and while this could be some sort of atypical subcapsular hemangioma the lesion is overall nonspecific and complex. Pancreas: Unremarkable Spleen: Unremarkable Adrenals/Urinary Tract: Both adrenal glands appear normal. Small hypodense exophytic lesions of both kidneys are technically too small to characterize although statistically likely to be cysts. Urinary bladder unremarkable. Stomach/Bowel: Prominent stool throughout the colon favors constipation. Vascular/Lymphatic: Aortoiliac atherosclerotic vascular disease. Reproductive: Prostatomegaly, probable TURP in the past. Other: No supplemental non-categorized findings. Musculoskeletal: Left hip prosthesis. Moderate degenerative right hip arthropathy. Acute or subacute transverse sacral fracture at the S3 vertebral level best appreciated on parasagittal images. Lumbar spondylosis and degenerative disc disease causing multilevel impingement. IMPRESSION: 1. Acute or subacute transverse sacral fracture at the S3 level. 2. Lumbar spondylosis and degenerative disc disease causing multilevel impingement. 3. Nonspecific subcapsular lesion posteriorly along the right hepatic lobe. Although possibly an atypical hemangioma, hematoma or other mass cannot be excluded. Given the patient has abnormal liver enzymes it may be prudent to further work this up, potentially with hepatic protocol MRI with and without contrast. 4.  Aortic Atherosclerosis (ICD10-I70.0).  Coronary atherosclerosis. 5.  Prominent stool throughout the colon favors constipation. 6. Prostatomegaly 7. Moderate degenerative right hip arthropathy.  Electronically Signed   By: Van Clines M.D.   On: 05/01/2020 12:33   DG Hip Unilat W or Wo Pelvis 2-3 Views Left  Result Date: 05/01/2020 CLINICAL DATA:  Unwitnessed  fall EXAM: DG HIP (WITH OR WITHOUT PELVIS) 2-3V LEFT COMPARISON:  11/23/2019 FINDINGS: Osseous demineralization. LEFT hip prosthesis in expected position. RIGHT hip joint space narrowing. No acute fracture, dislocation, or bone destruction. Few scattered atherosclerotic calcifications. IMPRESSION: LEFT hip prosthesis. No acute osseous abnormalities. Electronically Signed   By: Lavonia Dana M.D.   On: 05/01/2020 09:02    Procedures Procedures   Medications Ordered in ED Medications  iohexol (OMNIPAQUE) 300 MG/ML solution 80 mL (80 mLs Intravenous Contrast Given 05/01/20 1139)    ED Course  I have reviewed the triage vital signs and the nursing notes.  Pertinent labs & imaging results that were available during my care of the patient were reviewed by me and considered in my medical decision making (see chart for details).  Clinical Course as of 05/01/20 1332  Thu May 01, 2020  0911 Head CT without acute findings.  Hip x-ray without acute findings.  Left ankle with questionable finding of the calcaneus [JK]  0912 Labs unremarkable.  Anemia noted but hemoglobin is actually increased from previous values [JK]  0926 UPdated family on status.  Pt is sleeping but did wake up.  States he has to go to the bathroom.  Will try to have him ambulate. [XI]  3382 Notified by family, recent visit for abnormal liver tests.  No prior labs in epic for comparison.  Will check lfts, ammonia here.  Family concerned because normally he is more alert. [JK]  1055 Liver enzymes are significantly elevated [JK]  1124 Discussed findings with family.  The elevated liver enzymes were just recently noticed by the PCP.  Patient has not had any imaging testing.  Symptoms concerning for the possibility of occult malignancy.  We will proceed with CT imaging while the patient is here in the ED [JK]  1237 CT scan shows nonspecific lesion in the liver.  Patient does have acute or subacute fracture in the sacrum [JK]  1251 Discussed  indings with family.  Will consult with gastroenterology [NK]  5397 Discussed case with Dr Watt Climes.  Liver enzymes slightly higher today.  Normal 1 year ago.  Would benefit from MRI , MRCP but does not have to be an inpatient procedure. [QB]  3419 Discussed with Jeanette Caprice, Dr Mitchell's office.  Will arrange in the office for MR, MRCP [JK]    Clinical Course User Index [JK] Dorie Rank, MD   MDM Rules/Calculators/A&P                          Patient presented to the ED for evaluation after fall.  He recently moved to a nursing facility for his dementia.  Patient denied any pain initially but family indicated he has Had a lot of some lower back pain.  They also mention he was recently told his liver enzymes were abnormal.  His laboratory tests do show persistent liver enzyme abnormalities.  He has no findings on his head CT.  Did perform a CT abdomen pelvis to further evaluate his liver enzyme abnormality and it did show a nodule in the liver.  Unclear if that would be related to his findings.  Discussed case with Dr. Daisey Must patient will need further outpatient work-up including MRI MRCP.  I did speak with Dr. Virgilio Belling office  and they will pass on the message to Dr. Alroy Dust to have that test ordered.  Patient does have a sacral fracture.  He has been complaining some back pain but patient has been able to ambulate here without difficulty.  Stable for outpatient follow-up Final Clinical Impression(s) / ED Diagnoses Final diagnoses:  Elevated liver enzymes  Closed fracture of sacrum, unspecified portion of sacrum, initial encounter [facility])    Rx / [facility] Orders ED Discharge Orders    None       Dorie Rank, MD 05/01/20 1332

## 2020-05-02 ENCOUNTER — Other Ambulatory Visit (HOSPITAL_COMMUNITY): Payer: Self-pay

## 2020-05-02 ENCOUNTER — Other Ambulatory Visit: Payer: Self-pay

## 2020-05-02 DIAGNOSIS — K769 Liver disease, unspecified: Secondary | ICD-10-CM

## 2020-05-02 DIAGNOSIS — R7989 Other specified abnormal findings of blood chemistry: Secondary | ICD-10-CM

## 2020-05-02 MED FILL — Latanoprost Ophth Soln 0.005%: OPHTHALMIC | 25 days supply | Qty: 2.5 | Fill #0 | Status: AC

## 2020-05-06 ENCOUNTER — Ambulatory Visit
Admission: RE | Admit: 2020-05-06 | Discharge: 2020-05-06 | Disposition: A | Discharge status: Home / Self Care | Payer: Medicare Other | Source: Ambulatory Visit | Attending: Family Medicine | Admitting: Family Medicine

## 2020-05-06 ENCOUNTER — Other Ambulatory Visit: Payer: Self-pay

## 2020-05-06 DIAGNOSIS — R7989 Other specified abnormal findings of blood chemistry: Secondary | ICD-10-CM

## 2020-05-06 DIAGNOSIS — K769 Liver disease, unspecified: Secondary | ICD-10-CM

## 2020-05-06 DIAGNOSIS — K7689 Other specified diseases of liver: Secondary | ICD-10-CM | POA: Diagnosis not present

## 2020-05-06 MED ORDER — GADOBENATE DIMEGLUMINE 529 MG/ML IV SOLN
11.0000 mL | Freq: Once | INTRAVENOUS | Status: AC | PRN
  Administered 2020-05-06: 11 mL via INTRAVENOUS

## 2020-05-09 ENCOUNTER — Emergency Department (HOSPITAL_COMMUNITY): Payer: Medicare Other

## 2020-05-09 ENCOUNTER — Encounter (HOSPITAL_COMMUNITY): Payer: Self-pay | Admitting: Internal Medicine

## 2020-05-09 ENCOUNTER — Observation Stay (HOSPITAL_COMMUNITY)
Admission: EM | Admit: 2020-05-09 | Discharge: 2020-05-15 | Disposition: A | Discharge status: Home / Self Care | Payer: Medicare Other | Attending: Internal Medicine | Admitting: Internal Medicine

## 2020-05-09 ENCOUNTER — Observation Stay (HOSPITAL_COMMUNITY): Payer: Medicare Other

## 2020-05-09 DIAGNOSIS — Z87891 Personal history of nicotine dependence: Secondary | ICD-10-CM | POA: Diagnosis not present

## 2020-05-09 DIAGNOSIS — Z96642 Presence of left artificial hip joint: Secondary | ICD-10-CM | POA: Diagnosis not present

## 2020-05-09 DIAGNOSIS — R4182 Altered mental status, unspecified: Principal | ICD-10-CM | POA: Insufficient documentation

## 2020-05-09 DIAGNOSIS — I10 Essential (primary) hypertension: Secondary | ICD-10-CM | POA: Diagnosis not present

## 2020-05-09 DIAGNOSIS — R404 Transient alteration of awareness: Secondary | ICD-10-CM | POA: Diagnosis not present

## 2020-05-09 DIAGNOSIS — Z8551 Personal history of malignant neoplasm of bladder: Secondary | ICD-10-CM | POA: Insufficient documentation

## 2020-05-09 DIAGNOSIS — I2510 Atherosclerotic heart disease of native coronary artery without angina pectoris: Secondary | ICD-10-CM | POA: Diagnosis not present

## 2020-05-09 DIAGNOSIS — R296 Repeated falls: Secondary | ICD-10-CM | POA: Diagnosis not present

## 2020-05-09 DIAGNOSIS — Z85828 Personal history of other malignant neoplasm of skin: Secondary | ICD-10-CM | POA: Diagnosis not present

## 2020-05-09 DIAGNOSIS — F01518 Vascular dementia, unspecified severity, with other behavioral disturbance: Secondary | ICD-10-CM | POA: Diagnosis present

## 2020-05-09 DIAGNOSIS — R4701 Aphasia: Secondary | ICD-10-CM

## 2020-05-09 DIAGNOSIS — I6521 Occlusion and stenosis of right carotid artery: Secondary | ICD-10-CM | POA: Diagnosis not present

## 2020-05-09 DIAGNOSIS — Z20822 Contact with and (suspected) exposure to covid-19: Secondary | ICD-10-CM | POA: Insufficient documentation

## 2020-05-09 DIAGNOSIS — L8990 Pressure ulcer of unspecified site, unspecified stage: Secondary | ICD-10-CM | POA: Insufficient documentation

## 2020-05-09 DIAGNOSIS — F0390 Unspecified dementia without behavioral disturbance: Secondary | ICD-10-CM | POA: Diagnosis not present

## 2020-05-09 DIAGNOSIS — F0151 Vascular dementia with behavioral disturbance: Secondary | ICD-10-CM | POA: Diagnosis not present

## 2020-05-09 DIAGNOSIS — W19XXXA Unspecified fall, initial encounter: Secondary | ICD-10-CM

## 2020-05-09 DIAGNOSIS — I639 Cerebral infarction, unspecified: Secondary | ICD-10-CM | POA: Diagnosis not present

## 2020-05-09 DIAGNOSIS — E785 Hyperlipidemia, unspecified: Secondary | ICD-10-CM | POA: Diagnosis present

## 2020-05-09 DIAGNOSIS — R4781 Slurred speech: Secondary | ICD-10-CM | POA: Diagnosis present

## 2020-05-09 DIAGNOSIS — R0902 Hypoxemia: Secondary | ICD-10-CM | POA: Diagnosis not present

## 2020-05-09 DIAGNOSIS — Z79899 Other long term (current) drug therapy: Secondary | ICD-10-CM | POA: Insufficient documentation

## 2020-05-09 DIAGNOSIS — K7689 Other specified diseases of liver: Secondary | ICD-10-CM | POA: Diagnosis not present

## 2020-05-09 DIAGNOSIS — R410 Disorientation, unspecified: Secondary | ICD-10-CM | POA: Diagnosis not present

## 2020-05-09 DIAGNOSIS — M542 Cervicalgia: Secondary | ICD-10-CM | POA: Diagnosis not present

## 2020-05-09 HISTORY — DX: Vascular dementia, unspecified severity, without behavioral disturbance, psychotic disturbance, mood disturbance, and anxiety: F01.50

## 2020-05-09 LAB — URINALYSIS, ROUTINE W REFLEX MICROSCOPIC
Bilirubin Urine: NEGATIVE
Glucose, UA: NEGATIVE mg/dL
Hgb urine dipstick: NEGATIVE
Ketones, ur: NEGATIVE mg/dL
Leukocytes,Ua: NEGATIVE
Nitrite: NEGATIVE
Protein, ur: NEGATIVE mg/dL
Specific Gravity, Urine: 1.01 (ref 1.005–1.030)
pH: 8 (ref 5.0–8.0)

## 2020-05-09 LAB — PROTIME-INR
INR: 0.9 (ref 0.8–1.2)
Prothrombin Time: 12 seconds (ref 11.4–15.2)

## 2020-05-09 LAB — HEMOGLOBIN A1C
Hgb A1c MFr Bld: 5.8 % — ABNORMAL HIGH (ref 4.8–5.6)
Mean Plasma Glucose: 119.76 mg/dL

## 2020-05-09 LAB — COMPREHENSIVE METABOLIC PANEL
ALT: 151 U/L — ABNORMAL HIGH (ref 0–44)
AST: 83 U/L — ABNORMAL HIGH (ref 15–41)
Albumin: 3.1 g/dL — ABNORMAL LOW (ref 3.5–5.0)
Alkaline Phosphatase: 543 U/L — ABNORMAL HIGH (ref 38–126)
Anion gap: 6 (ref 5–15)
BUN: 17 mg/dL (ref 8–23)
CO2: 27 mmol/L (ref 22–32)
Calcium: 9 mg/dL (ref 8.9–10.3)
Chloride: 105 mmol/L (ref 98–111)
Creatinine, Ser: 0.79 mg/dL (ref 0.61–1.24)
GFR, Estimated: >60 mL/min (ref >60)
Glucose, Bld: 107 mg/dL — ABNORMAL HIGH (ref 70–99)
Potassium: 3.9 mmol/L (ref 3.5–5.1)
Sodium: 138 mmol/L (ref 135–145)
Total Bilirubin: 0.9 mg/dL (ref 0.3–1.2)
Total Protein: 6.2 g/dL — ABNORMAL LOW (ref 6.5–8.1)

## 2020-05-09 LAB — I-STAT CHEM 8, ED
BUN: 19 mg/dL (ref 8–23)
Calcium, Ion: 1.2 mmol/L (ref 1.15–1.40)
Chloride: 105 mmol/L (ref 98–111)
Creatinine, Ser: 0.8 mg/dL (ref 0.61–1.24)
Glucose, Bld: 102 mg/dL — ABNORMAL HIGH (ref 70–99)
HCT: 34 % — ABNORMAL LOW (ref 39.0–52.0)
Hemoglobin: 11.6 g/dL — ABNORMAL LOW (ref 13.0–17.0)
Potassium: 3.9 mmol/L (ref 3.5–5.1)
Sodium: 140 mmol/L (ref 135–145)
TCO2: 26 mmol/L (ref 22–32)

## 2020-05-09 LAB — DIFFERENTIAL
Abs Immature Granulocytes: 0.02 10*3/uL (ref 0.00–0.07)
Basophils Absolute: 0 10*3/uL (ref 0.0–0.1)
Basophils Relative: 0 %
Eosinophils Absolute: 0.3 10*3/uL (ref 0.0–0.5)
Eosinophils Relative: 4 %
Immature Granulocytes: 0 %
Lymphocytes Relative: 11 %
Lymphs Abs: 0.8 10*3/uL (ref 0.7–4.0)
Monocytes Absolute: 0.8 10*3/uL (ref 0.1–1.0)
Monocytes Relative: 11 %
Neutro Abs: 5.6 10*3/uL (ref 1.7–7.7)
Neutrophils Relative %: 74 %

## 2020-05-09 LAB — LIPID PANEL
Cholesterol: 227 mg/dL — ABNORMAL HIGH (ref 0–200)
HDL: 99 mg/dL (ref >40)
LDL Cholesterol: 118 mg/dL — ABNORMAL HIGH (ref 0–99)
Total CHOL/HDL Ratio: 2.3 RATIO
Triglycerides: 52 mg/dL (ref <150)
VLDL: 10 mg/dL (ref 0–40)

## 2020-05-09 LAB — APTT: aPTT: 27 seconds (ref 24–36)

## 2020-05-09 LAB — CBC
HCT: 34.2 % — ABNORMAL LOW (ref 39.0–52.0)
Hemoglobin: 10.8 g/dL — ABNORMAL LOW (ref 13.0–17.0)
MCH: 29.4 pg (ref 26.0–34.0)
MCHC: 31.6 g/dL (ref 30.0–36.0)
MCV: 93.2 fL (ref 80.0–100.0)
Platelets: 355 10*3/uL (ref 150–400)
RBC: 3.67 MIL/uL — ABNORMAL LOW (ref 4.22–5.81)
RDW: 14.7 % (ref 11.5–15.5)
WBC: 7.6 10*3/uL (ref 4.0–10.5)
nRBC: 0 % (ref 0.0–0.2)

## 2020-05-09 LAB — RESP PANEL BY RT-PCR (FLU A&B, COVID) ARPGX2
Influenza A by PCR: NEGATIVE
Influenza B by PCR: NEGATIVE
SARS Coronavirus 2 by RT PCR: NEGATIVE

## 2020-05-09 LAB — CBG MONITORING, ED: Glucose-Capillary: 93 mg/dL (ref 70–99)

## 2020-05-09 MED ORDER — SODIUM CHLORIDE 0.9% FLUSH: 3.0000 mL | INTRAVENOUS | Status: DC | PRN

## 2020-05-09 MED ORDER — LEVOCETIRIZINE DIHYDROCHLORIDE 5 MG PO TABS: 5.0000 mg | ORAL_TABLET | Freq: Every evening | ORAL | Status: DC

## 2020-05-09 MED ORDER — HALOPERIDOL LACTATE 5 MG/ML IJ SOLN
1.0000 mg | Freq: Four times a day (QID) | INTRAMUSCULAR | Status: DC | PRN
  Administered 2020-05-09 – 2020-05-10 (×2): 1 mg via INTRAMUSCULAR
  Filled 2020-05-09 (×2): qty 1

## 2020-05-09 MED ORDER — SIMVASTATIN 20 MG PO TABS: 20.0000 mg | ORAL_TABLET | Freq: Every day | ORAL | Status: DC

## 2020-05-09 MED ORDER — TRAZODONE HCL 50 MG PO TABS: 25.0000 mg | ORAL_TABLET | Freq: Every day | ORAL | Status: DC

## 2020-05-09 MED ORDER — ENOXAPARIN SODIUM 40 MG/0.4ML IJ SOSY
40.0000 mg | PREFILLED_SYRINGE | INTRAMUSCULAR | Status: DC
  Administered 2020-05-09 – 2020-05-14 (×6): 40 mg via SUBCUTANEOUS
  Filled 2020-05-09 (×6): qty 0.4

## 2020-05-09 MED ORDER — ACETAMINOPHEN 500 MG PO TABS: 1000.0000 mg | ORAL_TABLET | Freq: Three times a day (TID) | ORAL | Status: DC | PRN

## 2020-05-09 MED ORDER — ASPIRIN EC 81 MG PO TBEC
81.0000 mg | DELAYED_RELEASE_TABLET | Freq: Every day | ORAL | Status: DC
  Administered 2020-05-10 – 2020-05-15 (×6): 81 mg via ORAL
  Filled 2020-05-09 (×6): qty 1

## 2020-05-09 MED ORDER — CETIRIZINE HCL 10 MG PO TABS
10.0000 mg | ORAL_TABLET | Freq: Every day | ORAL | Status: DC
  Administered 2020-05-09 – 2020-05-14 (×6): 10 mg via ORAL
  Filled 2020-05-09 (×7): qty 1

## 2020-05-09 MED ORDER — LISINOPRIL 10 MG PO TABS
10.0000 mg | ORAL_TABLET | Freq: Every day | ORAL | Status: DC
  Administered 2020-05-09 – 2020-05-15 (×7): 10 mg via ORAL
  Filled 2020-05-09 (×7): qty 1

## 2020-05-09 MED ORDER — NITROGLYCERIN 0.4 MG SL SUBL: 0.4000 mg | SUBLINGUAL_TABLET | SUBLINGUAL | Status: DC | PRN

## 2020-05-09 MED ORDER — DOCUSATE SODIUM 100 MG PO CAPS: 100.0000 mg | ORAL_CAPSULE | Freq: Every day | ORAL | Status: DC | PRN

## 2020-05-09 MED ORDER — SODIUM CHLORIDE 0.9% FLUSH
3.0000 mL | Freq: Two times a day (BID) | INTRAVENOUS | Status: DC
  Administered 2020-05-09 – 2020-05-14 (×9): 3 mL via INTRAVENOUS

## 2020-05-09 MED ORDER — CLOPIDOGREL BISULFATE 75 MG PO TABS
75.0000 mg | ORAL_TABLET | Freq: Every day | ORAL | Status: DC
  Administered 2020-05-09: 75 mg via ORAL
  Filled 2020-05-09: qty 1

## 2020-05-09 MED ORDER — VITAMIN B-12 1000 MCG PO TABS
1000.0000 ug | ORAL_TABLET | Freq: Every day | ORAL | Status: DC
  Administered 2020-05-09 – 2020-05-15 (×7): 1000 ug via ORAL
  Filled 2020-05-09 (×7): qty 1

## 2020-05-09 MED ORDER — SODIUM CHLORIDE 0.9% FLUSH: 3.0000 mL | Freq: Once | INTRAVENOUS | Status: DC

## 2020-05-09 MED ORDER — HALOPERIDOL 0.5 MG PO TABS
0.5000 mg | ORAL_TABLET | Freq: Four times a day (QID) | ORAL | Status: DC | PRN
  Filled 2020-05-09: qty 1

## 2020-05-09 MED ORDER — SODIUM CHLORIDE 0.9 % IV SOLN: 250.0000 mL | INTRAVENOUS | Status: DC | PRN

## 2020-05-09 MED ORDER — ASPIRIN 325 MG PO TABS
325.0000 mg | ORAL_TABLET | ORAL | Status: AC
  Administered 2020-05-09: 325 mg via ORAL
  Filled 2020-05-09: qty 1

## 2020-05-09 NOTE — ED Notes (Signed)
Pt transported to MRI 

## 2020-05-09 NOTE — Progress Notes (Signed)
Pt arrived on the unit, pt very agitated and shaky AOx1 to self and daughters. Daughter who is a Therapist, sports at bedside stated that the patient gets very agitated when he is in the hospital. MD paged needing orders for safety sitter. Asked daughter if she felt a Telesitter would be appropriate and she said NO, he needs a Surveyor, minerals in the room.

## 2020-05-09 NOTE — Consult Note (Signed)
Neurology Consultation & Code Stroke Note  Reason for Consult: Confusion s/p fall Referring Physician: Dr. Francia Greaves  CC: Confusion s/p fall  History is obtained from: Patient's wife via telephone, Chart review  HPI: Jesus Harmon is a 87 y.o. male with a medical history significant for vascular dementia, coronary artery disease s/p stenting, hypertension, hyperlipidemia, remote smoking history, bladder cancer, and multiple recent falls who presents to the ED as a Code Stroke after his wife noted Jesus Harmon was confused after sustaining a fall in the kitchen this morning. She states that he is supposed to ambulate with a Misner but sometimes does not and falls with two falls today; one at 01:00 this morning and the second at 08:30. She did not witness the fall in the kitchen this morning but patient inconsistently states that he thinks that he "bumped his head" and after the fall. After his fall in the kitchen, he was no longer able to identify his wife which is abnormal for him, prompting EMS activation. He does not currently take anticoagulation or antiplatelet medications.  On chart review, Jesus Harmon had a hip replacement 11/2019 due to an injury sustained during a fall and was using a cane for ambulation following this procedure. Per outpatient neurology note 03/19/2020 with Edman Circle, NP: Mr. Harmon needs assistance with dressing but is able to complete other ADLs independently and his daughter manages his medications. At this time his MMSE was 15/30. He has a history of unsteady ambulation at home with progressively worsening balance issues.  As of 05/10/2019 Dr. Jaynee Eagles noted: "Symptoms have been going on for "a while", no falls, he feels mildly off balance, no spinning or lightheadedness, word finding difficulties, reported only subtle memory changes, occasionally makes her long-term but can get back on the path fairly quickly."  LKW: 08:00 tpa given?: no, patient with multiple recent falls that  are unwitnessed but with strong possibility of hitting his head during recent falls, some question about LKW per family, also exam nonfocal see below IR Thrombectomy? No, low suspicion for LVO Modified Rankin Scale: 4-Needs assistance to walk and tend to bodily needs  ROS: Unable to obtain due to altered mental status.   Past Medical History:  Diagnosis Date  . Arthritis    back and neck (06/16/2016)  . Bladder cancer ([place])   . Blockage of coronary artery of heart (HCC)    "widow maker", had stenting of the R side but L main is still blocked   . CAD (coronary artery disease)    a. LHC 10/2009:  pLAD 50%, small D1 70-80% (too small for PCI), EF 65% => med Rx  b. Myoview 10/2009: EF 65%, no ischemia;  c.  ETT-MV 3/14:  No ischemia, EF 63%  . Cancer [facility])    sin cancer removed nose   . Diverticulosis   . Glaucoma   . History of echocardiogram    Echo 02/2018: EF 123456, +diastolic dysfunction, no RWMA, normal RVSF, trivial MR  . History of kidney stones    "passed them"  . HLD (hyperlipidemia)    "on RX; never had high cholestrol" (06/16/2016)  . HTN (hypertension)    "on RX; never HTN" (06/16/2016)  . Migraine    "in high school" (06/16/2016)   Past Surgical History:  Procedure Laterality Date  . BASAL CELL CARCINOMA EXCISION     "one of my ears; big one on my nose; one on my left shoulder" (06/16/2016)  . CARDIAC CATHETERIZATION  2017   "  treated w/RX"  . CATARACT EXTRACTION W/ INTRAOCULAR LENS  IMPLANT, BILATERAL Bilateral   . CORONARY ANGIOPLASTY WITH STENT PLACEMENT  06/16/2016  . CORONARY BALLOON ANGIOPLASTY N/A 06/16/2016   Procedure: Coronary Balloon Angioplasty;  Surgeon: Sherren Mocha, MD;  Location: [place] CV LAB;  Service: Cardiovascular;  Laterality: N/A;  Prox LAD  . CORONARY STENT INTERVENTION  06/16/2016   Procedure: Coronary Stent Intervention;  Surgeon: Sherren Mocha, MD;  Location: [place] [facility] LAB;  Service: Cardiovascular;;  Synergy 4.0x16 to Mid RCA  . INGUINAL  HERNIA REPAIR Left 12/22/2016   Procedure: REPAIR OF LEFT INGUINAL HERNIA WITH MESH;  Surgeon: Georganna Skeans, MD;  Location: [facility];  Service: General;  Laterality: Left;  . INGUINAL HERNIA REPAIR Left 10/09/2019   Procedure: LAPAROSCOPIC LEFT INGUINAL HERNIA REPAIR WITH MESH;  Surgeon: Ralene Ok, MD;  Location: [place];  Service: General;  Laterality: Left;  . INSERTION OF MESH Left 12/22/2016   Procedure: INSERTION OF MESH;  Surgeon: Georganna Skeans, MD;  Location: [facility];  Service: General;  Laterality: Left;  . INTRAVASCULAR PRESSURE WIRE/FFR STUDY N/A 06/16/2016   Procedure: Intravascular Pressure Wire/FFR Study;  Surgeon: Sherren Mocha, MD;  Location: [place] [facility];  Service: Cardiovascular;  Laterality: N/A;  Prox LAD  . KNEE ARTHROSCOPY     "not sure which side"  . LEFT HEART CATH AND CORONARY ANGIOGRAPHY N/A 06/16/2016   Procedure: Left Heart Cath and Coronary Angiography;  Surgeon: Sherren Mocha, MD;  Location: [place] [facility];  Service: Cardiovascular;  Laterality: N/A;  . TONSILLECTOMY    . TOTAL HIP ARTHROPLASTY Left 11/23/2019   Procedure: TOTAL HIP ARTHROPLASTY ANTERIOR APPROACH;  Surgeon: Rod Can, MD;  Location: [facility];  Service: Orthopedics;  Laterality: Left;  . TRANSURETHRAL RESECTION OF PROSTATE N/A 03/04/2014   Procedure: TRANSURETHRAL RESECTION OF THE PROSTATE, TRANSURETHRAL RESECTION OF THE BLADDER TUMOR;  Surgeon: Ailene Rud, MD;  Location: [facility];  Service: Urology;  Laterality: N/A;   Family History  Problem Relation Age of Onset  . Sudden death Mother        sudden cardiac arrest  . COPD Father   . Heart failure Father        CHF  . Emphysema Father   . Parkinson's disease Maternal Uncle   . Dementia Neg Hx   . Alzheimer's disease Neg Hx    Social History:   reports that he quit smoking about 59 years ago. He has a 18.00 pack-year smoking history. He has never used smokeless tobacco. He reports that he does not drink alcohol and  does not use drugs.  Medications  Current Facility-Administered Medications:  .  sodium chloride flush (NS) 0.9 % injection 3 mL, 3 mL, Intravenous, Once, Messick, Wallis Bamberg, MD  Current Outpatient Medications:  .  acetaminophen (TYLENOL) 500 MG tablet, Take 2 tablets (1,000 mg total) by mouth every 8 (eight) hours as needed for moderate pain., Disp: 30 tablet, Rfl: 0 .  clindamycin (CLEOCIN) 300 MG capsule, TAKE 2 CAPSULE(S) 1-2 HOURS PRIOR TO DENTAL WORK BY MOUTH, Disp: 10 capsule, Rfl: 0 .  docusate sodium (COLACE) 100 MG capsule, Take 1 capsule (100 mg total) by mouth 2 (two) times daily., Disp: 10 capsule, Rfl: 0 .  latanoprost (XALATAN) 0.005 % ophthalmic solution, Place 1 drop into both eyes at bedtime., Disp: , Rfl:  .  latanoprost (XALATAN) 0.005 % ophthalmic solution, PLACE 1 DROP INTO BOTH EYES NIGHTLY., Disp: 2.5 mL, Rfl: 5 .  levocetirizine (XYZAL)  5 MG tablet, Take 5 mg by mouth every evening. , Disp: , Rfl:  .  levocetirizine (XYZAL) 5 MG tablet, TAKE 1 TABLET BY MOUTH EVERY EVENING, Disp: 30 tablet, Rfl: 5 .  lisinopril (ZESTRIL) 10 MG tablet, TAKE 1 TABLET (10 MG TOTAL) BY MOUTH DAILY. PLEASE KEEP UPCOMING APPT IN MAY 2022 BEFORE ANYMORE REFILLS. THANK YOU, Disp: 90 tablet, Rfl: 0 .  nitroGLYCERIN (NITROSTAT) 0.4 MG SL tablet, PLACE 1 TABLET UNDER THE TONGUE EVERY 5 MINUTES AS NEEDED FOR CHEST PAIN. (Patient taking differently: Place 0.4 mg under the tongue every 5 (five) minutes x 3 doses as needed for chest pain.), Disp: 25 tablet, Rfl: 5 .  polyethylene glycol (MIRALAX / GLYCOLAX) 17 g packet, Take 17 g by mouth daily as needed for mild constipation., Disp: 14 each, Rfl: 0 .  simvastatin (ZOCOR) 20 MG tablet, TAKE 1 TABLET BY MOUTH DAILY. (Patient taking differently: Take 20 mg by mouth at bedtime.), Disp: 90 tablet, Rfl: 3 .  tiZANidine (ZANAFLEX) 2 MG tablet, Take 2 mg by mouth daily as needed for muscle spasms. , Disp: , Rfl:  .  tiZANidine (ZANAFLEX) 2 MG tablet, TAKE 1 TO 2  TABLETS BY MOUTH EVERY 8 HOURS AS NEEDED FOR MUSCLE SPASMS, Disp: 30 tablet, Rfl: 0 .  tiZANidine (ZANAFLEX) 2 MG tablet, TAKE 1 TO 2 TABLETS BY MOUTH EVERY 8 HOURS AS NEEDED FOR MUSCLE SPASMS, Disp: 30 tablet, Rfl: 0 .  traZODone (DESYREL) 50 MG tablet, TAKE 0.5 TABLETS (25 MG TOTAL) BY MOUTH AT BEDTIME., Disp: 45 tablet, Rfl: 3 .  traZODone (DESYREL) 50 MG tablet, TAKE 1 TABLET BY MOUTH 2 HOURS BEFORE BEDTIME, Disp: 30 tablet, Rfl: 12 .  vitamin B-12 (CYANOCOBALAMIN) 1000 MCG tablet, Take 1,000 mcg by mouth daily., Disp: , Rfl:   Exam: Current vital signs: BP (!) 150/69 (BP Location: Right Arm)   Pulse (!) 58   Temp 97.8 F (36.6 C) (Oral)   Resp 16   Wt 61.2 kg   SpO2 100%   BMI 18.82 kg/m  Vital signs in last 24 hours: Temp:  [97.8 F (36.6 C)] 97.8 F (36.6 C) (04/29 1205) Pulse Rate:  [58] 58 (04/29 1205) Resp:  [16] 16 (04/29 1205) BP: (150)/(69) 150/69 (04/29 1205) SpO2:  [100 %] 100 % (04/29 1205) Weight:  [61.2 kg] 61.2 kg (04/29 1100)  GENERAL: Awake, alert, in no acute distress Head: Normocephalic without obvious abnormality EENT: Cervical spine collar in place, no OP obstruction LUNGS: Normal respiratory effort. Non-labored breathing CV: Bradycardia on telemetry, warm extremities without edema ABDOMEN: Soft, non-tender, non-distended Ext: warm, well perfused, without obvious abnormality  NEURO:  Mental Status: Awake, alert, oriented to self and age. He is unable to state the year and perseverates on "1960-something". Patient is aphasic- when asked to name a hand he is able to show his hand but is unable to state the object. He is unable to describe objects / pictures. He speech is not dysarthric and does not appear to be neglecting. When asked why he is in the hospital once back in the ED he states "well because I fell".  Cranial Nerves:  II: PERRL 2 mm/brisk. Blinks to threat throughout. III, IV, VI: EOMI, attends to stimulation in all visual fields V: Sensation  is intact to light touch and symmetrical to face. VII: Face is symmetric resting and smiling. VIII: Hearing is intact to voice IX, X: Phonation normal.  XI: Head appears midline. XII: Tongue protrudes midline.  Motor: 5/5  strength present in bilateral upper extremities without pronator drift. Bilateral lower extremities with weakness at baseline and 3/5 strength with some antigravity movement but unable to resist gravity. Tone is normal. Bulk is decreased.  Sensation: Unable to assess sensation to light touch in bilateral upper and lower extremities due to patient difficulty with communicating.  Coordination: FTN intact bilaterally. Unable to assess HKS secondary to weakness. No pronator drift.  DTRs: 2+ patellae and biceps.  Gait: deferred  NIHSS: 1a Level of Conscious.: 0 1b LOC Questions: 1 1c LOC Commands: 0 2 Best Gaze: 0 3 Visual: 0 4 Facial Palsy: 0 5a Motor Arm - left: 0 5b Motor Arm - Right: 0 6a Motor Leg - Left: 2 6b Motor Leg - Right: 2 7 Limb Ataxia: 0 8 Sensory: 0 9 Best Language: 1 10 Dysarthria: 0 11 Extinct. and Inatten.: 0 TOTAL: 6  Labs I have reviewed labs in epic and the results pertinent to this consultation are: CBC    Component Value Date/Time   WBC 7.6 05/09/2020 1148   RBC 3.67 (L) 05/09/2020 1148   HGB 11.6 (L) 05/09/2020 1151   HGB 11.1 (L) 02/21/2018 1614   HCT 34.0 (L) 05/09/2020 1151   HCT 35.4 (L) 02/21/2018 1614   PLT 355 05/09/2020 1148   PLT 249 02/21/2018 1614   MCV 93.2 05/09/2020 1148   MCV 93 02/21/2018 1614   MCH 29.4 05/09/2020 1148   MCHC 31.6 05/09/2020 1148   RDW 14.7 05/09/2020 1148   RDW 12.3 02/21/2018 1614   LYMPHSABS 0.8 05/09/2020 1148   MONOABS 0.8 05/09/2020 1148   EOSABS 0.3 05/09/2020 1148   BASOSABS 0.0 05/09/2020 1148   CMP     Component Value Date/Time   NA 140 05/09/2020 1151   NA 141 02/21/2018 1614   K 3.9 05/09/2020 1151   CL 105 05/09/2020 1151   CO2 28 05/01/2020 0807   GLUCOSE 102 (H)  05/09/2020 1151   BUN 19 05/09/2020 1151   BUN 17 02/21/2018 1614   CREATININE 0.80 05/09/2020 1151   CALCIUM 9.1 05/01/2020 0807   PROT 6.0 (L) 05/01/2020 1006   PROT 6.9 02/21/2018 1614   ALBUMIN 3.3 (L) 05/01/2020 1006   ALBUMIN 4.3 02/21/2018 1614   AST 152 (H) 05/01/2020 1006   ALT 322 (H) 05/01/2020 1006   ALKPHOS 590 (H) 05/01/2020 1006   BILITOT 1.5 (H) 05/01/2020 1006   BILITOT 0.3 02/21/2018 1614   GFRNONAA >60 05/01/2020 0807   GFRAA >60 10/05/2019 0922   Lipid Panel     Component Value Date/Time   CHOL 153 11/06/2010 1014   TRIG 34.0 11/06/2010 1014   HDL 82.40 11/06/2010 1014   CHOLHDL 2 11/06/2010 1014   VLDL 6.8 11/06/2010 1014   LDLCALC 64 11/06/2010 1014   Imaging I have reviewed the images obtained: CT-scan of the brain: IMPRESSION: 1. No evidence of acute intracranial abnormality. 2. ASPECTS is 10. 3. Severe chronic small vessel ischemic disease.  MRI examination of the brain  Assessment: 87 year old male with history as above including vascular dementia, frequent falls, and longstanding history of balance problems who presented today as a Code Stroke for evaluation of confusion and aphasia after sustaining a fall (possibly hitting head) in his kitchen this morning at 08:30. - Examination reveals patient with moderate aphasia, confusion, and bilateral lower extremity weakness.  - Not a tPA candidate due to multiple recent falls with possible head trauma. CT imaging without acute intracranial abnormality but with re-demonstration of severe  chronic small vessel disease. - Per neurology note 03/19/20, patient has history of unsteady ambulation at home with progressively worsening balance issues. As of 05/10/2019 Dr. Jaynee Eagles noted: "Symptoms have been going on for "a while", no falls, he feels mildly off balance, no spinning or lightheadedness, word finding difficulties, reported only subtle memory changes, occasionally makes her long-term but can get back on the  path fairly quickly." - Per anesthesia evaluation note in November 2021: "Recommendation: The patient will be observed overnight. Will continue dual antiplatelet therapy with aspirin and clopidogrel for 12 months. Consider atherectomy and PCI of the LAD lesion in a few weeks after reassessment of the patient in the outpatient setting."  Recommendations: - Hospitalist admission for continued observation - HgbA1c, fasting lipid panel - Neuro checks q2h x 12h; then q4h - STAT CT head with any neuro changes - MRI brain / MRA head and neck without contrast - Echocardiogram - Prophylactic therapy- Antiplatelet med: Aspirin 325 mg PO or 300 mg PR followed by 81 mg daily (supposed to be on DAPT therapy at home per anesthesia note 11/23/2019 but this is not supported by medication list) add plavix 75 mg daily - Risk factor modification - Telemetry monitoring - PT consult, OT consult, Speech consult    Anibal Henderson, AGAC-NP [facility] Pager: [phone]  I was present throughout the stroke code and made all relevant decisions including regarding not giving tPA. I have edited NP note to reflect my findings and recommendations. Stroke team will continue to follow.  Su Monks, MD [facility] [phone]  If 7pm- 7am, please page neurology on call as listed in [place].

## 2020-05-09 NOTE — Code Documentation (Signed)
Pt is 87 yr old male with CAD and dementia. He was last known well this morning at 0830, at which time he fell, and then became more confused than usual. EMS was called at that time, and a Code stroke was activated at 1135. He arrived to [facility] via EMS at 1142. Blood was drawn, CBG obtained (93) and pt cleared by EDP at the bridge. Pt was taken to CT #2 at 1145. CTNC negative for acute hemorrhage per Dr Quinn Axe. Neurological exam revealed aphasia, but no hemiplegia. NIHSS 6, (see flowsheet for NIHSS details and times). Upon more discussion with pt's caregiver, it was learned that pt also fell last night at 0100, and has been more confused than usual since that time. The main difference in his confusion was that he was not able to identify his wife today, and he usually can. Treatment decision made by Dr. Quinn Axe to admit and continue work-up. No TPA as unclear how far from baseline this pt is, and risks of TPA outweigh potential benefits as he has fallen at least twice in the past 24 hrs. No endovascular treatment offered as his clinical exam is LVO negative. Pt returned to room 21 where he will undergo workup including VS and mNIHSS q 2hr for 12 hrs, then q 4. Bedside handoff with Carlis Abbott RN complete.

## 2020-05-09 NOTE — ED Provider Notes (Signed)
[facility]PART[facility] Provider Note   CSN: [id number] Arrival date & time: 05/09/20  1142     History Chief Complaint  Patient presents with  . Code Stroke    YOON BARCA is a 87 y.o. male.  87 year old male with prior medical history as detailed below presents for evaluation.  Patient arrives by EMS.  Patient is a code stroke.  Patient seen by neurology on arrival.  Patient with initial reported last known normal was around 8:30am.  Patient apparently fell at that time.  He did strike his head.  After the fall the patient's speech was noted to be slurred.  Patient was also very confused and did not recognize family members or his environment.  Level 5 caveat.  Patient has a history of dementia.  At baseline he is somewhat confused.  Patient was taken emergently to CT for imaging.  Patient is not a candidate for tPA per neurology.  Patient will require admission for further work-up and observation.  The history is provided by the patient and medical records.  Illness Location:  Fall, head injury, aphasia, confusion Severity:  Moderate Onset quality:  Unable to specify Timing:  Unable to specify Progression:  Unable to specify Chronicity:  New      Past Medical History:  Diagnosis Date  . Arthritis    back and neck (06/16/2016)  . Bladder cancer ([place])   . Blockage of coronary artery of heart (HCC)    "widow maker", had stenting of the R side but L main is still blocked   . CAD (coronary artery disease)    a. LHC 10/2009:  pLAD 50%, small D1 70-80% (too small for PCI), EF 65% => med Rx  b. Myoview 10/2009: EF 65%, no ischemia;  c.  ETT-MV 3/14:  No ischemia, EF 63%  . Cancer [facility])    sin cancer removed nose   . Diverticulosis   . Glaucoma   . History of echocardiogram    Echo 02/2018: EF 54-62, +diastolic dysfunction, no RWMA, normal RVSF, trivial MR  . History of kidney stones    "passed them"  . HLD (hyperlipidemia)    "on RX; never  had high cholestrol" (06/16/2016)  . HTN (hypertension)    "on RX; never HTN" (06/16/2016)  . Migraine    "in high school" (06/16/2016)    Patient Active Problem List   Diagnosis Date Noted  . Dementia with behavioral disturbance ([place]) 11/24/2019  . Closed displaced fracture of left femoral neck ([place])   . B12 deficiency 11/23/2019  . Anemia 11/23/2019  . Pulmonary nodule, right 11/23/2019  . Hip fracture ([place]) 11/22/2019  . Pain due to onychomycosis of toenails of both feet 11/05/2019  . Vascular dementia with behavior disturbance ([place]) 10/17/2019  . Dementia without behavioral disturbance ([place]) 05/13/2019  . Bilateral impacted cerumen 11/29/2018  . Presbycusis of both ears 11/29/2018  . Vasomotor rhinitis 11/29/2018  . S/P inguinal hernia repair 12/22/2016  . CAD (coronary artery disease) 06/16/2016  . Coronary artery disease involving native coronary artery of native heart with angina pectoris ([place]) 06/16/2016  . Benign hypertrophy of prostate 03/04/2014  . Hyperlipidemia 02/17/2010  . Essential hypertension 02/17/2010  . CAD, NATIVE VESSEL  (06/16/2016): a. sev sten of mid right coronary art PTCA and DES , severe prox LAD stenosis unsucc PTCA, widely patent left main and left circ 11/14/2009  . FATIGUE 11/12/2009  . ARM NUMBNESS 11/12/2009  . DYSPNEA 11/12/2009    Past Surgical  History:  Procedure Laterality Date  . BASAL CELL CARCINOMA EXCISION     "one of my ears; big one on my nose; one on my left shoulder" (06/16/2016)  . CARDIAC CATHETERIZATION  2017   "treated w/RX"  . CATARACT EXTRACTION W/ INTRAOCULAR LENS  IMPLANT, BILATERAL Bilateral   . CORONARY ANGIOPLASTY WITH STENT PLACEMENT  06/16/2016  . CORONARY BALLOON ANGIOPLASTY N/A 06/16/2016   Procedure: Coronary Balloon Angioplasty;  Surgeon: Sherren Mocha, MD;  Location: [facility];  Service: Cardiovascular;  Laterality: N/A;  Prox LAD  . CORONARY STENT INTERVENTION  06/16/2016   Procedure: Coronary Stent  Intervention;  Surgeon: Sherren Mocha, MD;  Location: [facility];  Service: Cardiovascular;;  Synergy 4.0x16 to Mid RCA  . INGUINAL HERNIA REPAIR Left 12/22/2016   Procedure: REPAIR OF LEFT INGUINAL HERNIA WITH MESH;  Surgeon: Georganna Skeans, MD;  Location: [facility];  Service: General;  Laterality: Left;  . INGUINAL HERNIA REPAIR Left 10/09/2019   Procedure: LAPAROSCOPIC LEFT INGUINAL HERNIA REPAIR WITH MESH;  Surgeon: Ralene Ok, MD;  Location: [facility];  Service: General;  Laterality: Left;  . INSERTION OF MESH Left 12/22/2016   Procedure: INSERTION OF MESH;  Surgeon: Georganna Skeans, MD;  Location: [facility];  Service: General;  Laterality: Left;  . INTRAVASCULAR PRESSURE WIRE/FFR STUDY N/A 06/16/2016   Procedure: Intravascular Pressure Wire/FFR Study;  Surgeon: Sherren Mocha, MD;  Location: [facility];  Service: Cardiovascular;  Laterality: N/A;  Prox LAD  . KNEE ARTHROSCOPY     "not sure which side"  . LEFT HEART CATH AND CORONARY ANGIOGRAPHY N/A 06/16/2016   Procedure: Left Heart Cath and Coronary Angiography;  Surgeon: Sherren Mocha, MD;  Location: [facility];  Service: Cardiovascular;  Laterality: N/A;  . TONSILLECTOMY    . TOTAL HIP ARTHROPLASTY Left 11/23/2019   Procedure: TOTAL HIP ARTHROPLASTY ANTERIOR APPROACH;  Surgeon: Rod Can, MD;  Location: [facility];  Service: Orthopedics;  Laterality: Left;  . TRANSURETHRAL RESECTION OF PROSTATE N/A 03/04/2014   Procedure: TRANSURETHRAL RESECTION OF THE PROSTATE, TRANSURETHRAL RESECTION OF THE BLADDER TUMOR;  Surgeon: Ailene Rud, MD;  Location: [facility];  Service: Urology;  Laterality: N/A;       Family History  Problem Relation Age of Onset  . Sudden death Mother        sudden cardiac arrest  . COPD Father   . Heart failure Father        CHF  . Emphysema Father   . Parkinson's disease Maternal Uncle   . Dementia Neg Hx   . Alzheimer's disease Neg Hx     Social History   Tobacco Use  .  Smoking status: Former Smoker    Packs/day: 1.00    Years: 18.00    Pack years: 18.00    Quit date: 1963    Years since quitting: 59.3  . Smokeless tobacco: Never Used  Vaping Use  . Vaping Use: Never used  Substance Use Topics  . Alcohol use: Never  . Drug use: Never    Home Medications Prior to Admission medications   Medication Sig Start Date End Date Taking? Authorizing Provider  acetaminophen (TYLENOL) 500 MG tablet Take 2 tablets (1,000 mg total) by mouth every 8 (eight) hours as needed for moderate pain. 11/28/19   Elodia Florence., MD  clindamycin (CLEOCIN) 300 MG capsule TAKE 2 CAPSULE(S) 1-2 HOURS PRIOR TO DENTAL WORK BY MOUTH 03/06/20 03/06/21  Swinteck, Aaron Edelman, MD  docusate sodium (COLACE)  100 MG capsule Take 1 capsule (100 mg total) by mouth 2 (two) times daily. 11/27/19   Eugenie Filler, MD  latanoprost (XALATAN) 0.005 % ophthalmic solution Place 1 drop into both eyes at bedtime.    [provider]  latanoprost (XALATAN) 0.005 % ophthalmic solution PLACE 1 DROP INTO BOTH EYES NIGHTLY. 10/15/19 10/14/20  Rutherford Guys, MD  levocetirizine (XYZAL) 5 MG tablet Take 5 mg by mouth every evening.     [provider]  levocetirizine (XYZAL) 5 MG tablet TAKE 1 TABLET BY MOUTH EVERY EVENING 03/12/19 03/11/20  Mosetta Anis, MD  lisinopril (ZESTRIL) 10 MG tablet TAKE 1 TABLET (10 MG TOTAL) BY MOUTH DAILY. PLEASE KEEP UPCOMING APPT IN MAY 2022 BEFORE ANYMORE REFILLS. [place] YOU 04/04/20 04/04/21  Sherren Mocha, MD  nitroGLYCERIN (NITROSTAT) 0.4 MG SL tablet PLACE 1 TABLET UNDER THE TONGUE EVERY 5 MINUTES AS NEEDED FOR CHEST PAIN. Patient taking differently: Place 0.4 mg under the tongue every 5 (five) minutes x 3 doses as needed for chest pain. 05/10/19   Sherren Mocha, MD  polyethylene glycol (MIRALAX / GLYCOLAX) 17 g packet Take 17 g by mouth daily as needed for mild constipation. 11/27/19   Eugenie Filler, MD  simvastatin (ZOCOR) 20 MG tablet TAKE 1 TABLET BY  MOUTH DAILY. Patient taking differently: Take 20 mg by mouth at bedtime. 05/22/19 05/21/20  Sherren Mocha, MD  tiZANidine (ZANAFLEX) 2 MG tablet Take 2 mg by mouth daily as needed for muscle spasms.  10/03/19   [provider]  tiZANidine (ZANAFLEX) 2 MG tablet TAKE 1 TO 2 TABLETS BY MOUTH EVERY 8 HOURS AS NEEDED FOR MUSCLE SPASMS 03/17/20 03/17/21  [place], L.Marlou Sa, MD  tiZANidine (ZANAFLEX) 2 MG tablet TAKE 1 TO 2 TABLETS BY MOUTH EVERY 8 HOURS AS NEEDED FOR MUSCLE SPASMS 12/24/19 12/23/20  Alroy Dust, L.Marlou Sa, MD  traZODone (DESYREL) 50 MG tablet TAKE 0.5 TABLETS (25 MG TOTAL) BY MOUTH AT BEDTIME. 04/07/20 04/07/21  Ward Givens, NP  traZODone (DESYREL) 50 MG tablet TAKE 1 TABLET BY MOUTH 2 HOURS BEFORE BEDTIME 04/04/20 04/04/21  Alroy Dust, L.Marlou Sa, MD  vitamin B-12 (CYANOCOBALAMIN) 1000 MCG tablet Take 1,000 mcg by mouth daily.    [provider]  memantine (NAMENDA) 10 MG tablet Start with one pill in the morning. In 2 weeks can increase to one pill twice daily.Stop for any side effects. Patient not taking: Reported on 11/22/2019 10/17/19 11/28/19  Melvenia Beam, MD    Allergies    Amoxicillin, Sulfonamide derivatives, and Sulfamethoxazole  Review of Systems   Review of Systems  Unable to perform ROS: Dementia    Physical Exam Updated Vital Signs BP (!) 150/69 (BP Location: Right Arm)   Pulse (!) 58   Temp 97.8 F (36.6 C) (Oral)   Resp 16   Wt 61.2 kg   SpO2 100%   BMI 18.82 kg/m   Physical Exam Vitals and nursing note reviewed.  Constitutional:      General: He is not in acute distress.    Appearance: Normal appearance. He is well-developed.  HENT:     Head: Normocephalic and atraumatic.  Eyes:     Conjunctiva/sclera: Conjunctivae normal.     Pupils: Pupils are equal, round, and reactive to light.  Cardiovascular:     Rate and Rhythm: Normal rate and regular rhythm.     Heart sounds: Normal heart sounds.  Pulmonary:     Effort: Pulmonary effort is normal.  No respiratory distress.     Breath sounds:  Normal breath sounds.  Abdominal:     General: There is no distension.     Palpations: Abdomen is soft.     Tenderness: There is no abdominal tenderness.  Musculoskeletal:        General: No deformity. Normal range of motion.     Cervical back: Normal range of motion and neck supple.  Skin:    General: Skin is warm and dry.  Neurological:     General: No focal deficit present.     Mental Status: He is alert.     Comments: Mildly confuse - alert to Person, place  Follows commands  Normal speech, no facial droop  LVO screen negative     ED Results / Procedures / Treatments   Labs (all labs ordered are listed, but only abnormal results are displayed) Labs Reviewed  CBC - Abnormal; Notable for the following components:      Result Value   RBC 3.67 (*)    Hemoglobin 10.8 (*)    HCT 34.2 (*)    All other components within normal limits  I-STAT CHEM 8, ED - Abnormal; Notable for the following components:   Glucose, Bld 102 (*)    Hemoglobin 11.6 (*)    HCT 34.0 (*)    All other components within normal limits  RESP PANEL BY RT-PCR (FLU A&B, COVID) ARPGX2  PROTIME-INR  APTT  DIFFERENTIAL  COMPREHENSIVE METABOLIC PANEL  URINALYSIS, ROUTINE W REFLEX MICROSCOPIC  CBG MONITORING, ED    EKG None  Radiology CT HEAD CODE STROKE WO CONTRAST  Result Date: 05/09/2020 CLINICAL DATA:  Code stroke.  Altered mental status. EXAM: CT HEAD WITHOUT CONTRAST TECHNIQUE: Contiguous axial images were obtained from the base of the skull through the vertex without intravenous contrast. COMPARISON:  05/01/2020 FINDINGS: Brain: There is no evidence of an acute infarct, intracranial hemorrhage, mass, midline shift, or extra-axial fluid collection. A tiny chronic cortical/subcortical infarct is again seen in the right superior frontal gyrus. Confluent hypodensities in the cerebral white matter bilaterally are unchanged and nonspecific but compatible with  severe chronic small vessel ischemic disease. There is mild-to-moderate cerebral atrophy. Vascular: Calcified atherosclerosis at the skull base. No hyperdense vessel. Skull: No fracture or suspicious osseous lesion. Sinuses/Orbits: Partially visualized mild mucosal thickening in the left maxillary sinus. Clear mastoid air cells. Bilateral cataract extraction. Other: Mild soft tissue swelling/small hematoma is involving the right greater than left parietal scalp. ASPECTS [facility] Stroke Program Early CT Score) - Ganglionic level infarction (caudate, lentiform nuclei, internal capsule, insula, M1-M3 cortex): 7 - Supraganglionic infarction (M4-M6 cortex): 3 Total score (0-10 with 10 being normal): 10 IMPRESSION: 1. No evidence of acute intracranial abnormality. 2. ASPECTS is 10. 3. Severe chronic small vessel ischemic disease. These results were communicated to Dr. Quinn Axe at 12:02 pm on 05/09/2020 by text page via the [facility] messaging system. Electronically Signed   By: Logan Bores M.D.   On: 05/09/2020 12:02    Procedures Procedures   Medications Ordered in ED Medications  sodium chloride flush (NS) 0.9 % injection 3 mL (has no administration in time range)    ED Course  I have reviewed the triage vital signs and the nursing notes.  Pertinent labs & imaging results that were available during my care of the patient were reviewed by me and considered in my medical decision making (see chart for details).    MDM Rules/Calculators/A&P  MDM  MSE complete  MUNIR VICTORIAN was evaluated in Emergency Department on 05/09/2020 for the symptoms described in the history of present illness. He was evaluated in the context of the global COVID-19 pandemic, which necessitated consideration that the patient might be at risk for infection with the SARS-CoV-2 virus that causes COVID-19. Institutional protocols and algorithms that pertain to the evaluation of patients at risk for COVID-19 are  in a state of rapid change based on information released by regulatory bodies including the CDC and federal and state organizations. These policies and algorithms were followed during the patient's care in the ED.  Patient is presenting for evaluation of altered mental status and reported fall.  Patient initially seen as a code stroke by neuro.  Patient does not meet criteria for tPA or further acute neurologic intervention.  Patient will require admission for further medical work-up.  Hospitalist services aware of case.  Family is aware of plan of care.  Final Clinical Impression(s) / ED Diagnoses Final diagnoses:  Altered mental status, unspecified altered mental status type  Fall, initial encounter    Rx / DC Orders ED Discharge Orders    None       Valarie Merino, MD 05/09/20 1331

## 2020-05-09 NOTE — H&P (Signed)
History and Physical    Jesus Harmon VEH:[id number] DOB: 10/04/1929 DOA: 05/09/2020  PCP: Alroy Dust, L.Marlou Sa, MD   Patient coming from: home  I have personally briefly reviewed patient's old medical records in [place]  Chief Complaint: multiple falls with AMS  HPI: Jesus Harmon is a 87 y.o. male with medical history significant of progressive vascular dementia, recent fall with sacral fracture, THR in November '21, h/ sinus bradycardia, HTN. Last seen in ED 05/01/20 after fall and sacra fx seen. Also, elevated LFTs with CT abdomen revealing subcapsular hepatic nodule right lobe. At home he had two additional falls: 0100 hrs and 0800 hrs. He had change in mental status afterwards: slurred speech, confusion and did not recognize his wife. EMS activated and patient transferred to [facility] for evaluation.   ED Course: Code stroke initiated 2/2 falls and altered mental status. T 97.8  150/69  HR 58  RR 16. CT head negative, CT neck negative. Neuro saw patient: not candidate for tPA due to minimal symptoms and time elapsed since last normal exam. EDP exam non-focal. Lab normal. Neuro recommended observation admission for stroke w/u with CTA head, neck, Echo, MRI brain w/o contrast. [facility] called to admit patient.   Review of Systems: As per HPI otherwise 10 point review of systems negative. Caveat - limited historian 2/2 dementia \  Past Medical History:  Diagnosis Date  . Arthritis    back and neck (06/16/2016)  . Bladder cancer ([place])   . Blockage of coronary artery of heart (HCC)    "widow maker", had stenting of the R side but L main is still blocked   . CAD (coronary artery disease)    a. LHC 10/2009:  pLAD 50%, small D1 70-80% (too small for PCI), EF 65% => med Rx  b. Myoview 10/2009: EF 65%, no ischemia;  c.  ETT-MV 3/14:  No ischemia, EF 63%  . Cancer [facility])    sin cancer removed nose   . Diverticulosis   . Glaucoma   . History of echocardiogram    Echo 02/2018: EF 83-66,  +diastolic dysfunction, no RWMA, normal RVSF, trivial MR  . History of kidney stones    "passed them"  . HLD (hyperlipidemia)    "on RX; never had high cholestrol" (06/16/2016)  . HTN (hypertension)    "on RX; never HTN" (06/16/2016)  . Ischemic vascular dementia ([place])    last OV Neuro 03/19/20  MMSE 15/30  . Liver nodule 05/01/2020   subcapsular nodule right hepatic lobe with elevated LFTs  . Migraine    "in high school" (06/16/2016)    Past Surgical History:  Procedure Laterality Date  . BASAL CELL CARCINOMA EXCISION     "one of my ears; big one on my nose; one on my left shoulder" (06/16/2016)  . CARDIAC CATHETERIZATION  2017   "treated w/RX"  . CATARACT EXTRACTION W/ INTRAOCULAR LENS  IMPLANT, BILATERAL Bilateral   . CORONARY ANGIOPLASTY WITH STENT PLACEMENT  06/16/2016  . CORONARY BALLOON ANGIOPLASTY N/A 06/16/2016   Procedure: Coronary Balloon Angioplasty;  Surgeon: Sherren Mocha, MD;  Location: [place] [facility];  Service: Cardiovascular;  Laterality: N/A;  Prox LAD  . CORONARY STENT INTERVENTION  06/16/2016   Procedure: Coronary Stent Intervention;  Surgeon: Sherren Mocha, MD;  Location: [place] [facility];  Service: Cardiovascular;;  Synergy 4.0x16 to Mid RCA  . INGUINAL HERNIA REPAIR Left 12/22/2016   Procedure: REPAIR OF LEFT INGUINAL HERNIA WITH MESH;  Surgeon: Georganna Skeans, MD;  Location: [facility];  Service: General;  Laterality: Left;  . INGUINAL HERNIA REPAIR Left 10/09/2019   Procedure: LAPAROSCOPIC LEFT INGUINAL HERNIA REPAIR WITH MESH;  Surgeon: Ralene Ok, MD;  Location: [place];  Service: General;  Laterality: Left;  . INSERTION OF MESH Left 12/22/2016   Procedure: INSERTION OF MESH;  Surgeon: Georganna Skeans, MD;  [facility];  Service: General;  Laterality: Left;  . INTRAVASCULAR PRESSURE WIRE/FFR STUDY N/A 06/16/2016   Procedure: Intravascular Pressure Wire/FFR Study;  Surgeon: Sherren Mocha, MD;  Location: [place] [facility];  Service: Cardiovascular;   Laterality: N/A;  Prox LAD  . KNEE ARTHROSCOPY     "not sure which side"  . LEFT HEART CATH AND CORONARY ANGIOGRAPHY N/A 06/16/2016   Procedure: Left Heart Cath and Coronary Angiography;  Surgeon: Sherren Mocha, MD;  Location: [place] [facility];  Service: Cardiovascular;  Laterality: N/A;  . TONSILLECTOMY    . TOTAL HIP ARTHROPLASTY Left 11/23/2019   Procedure: TOTAL HIP ARTHROPLASTY ANTERIOR APPROACH;  Surgeon: Rod Can, MD;  [facility];  Service: Orthopedics;  Laterality: Left;  . TRANSURETHRAL RESECTION OF PROSTATE N/A 03/04/2014   Procedure: TRANSURETHRAL RESECTION OF THE PROSTATE, TRANSURETHRAL RESECTION OF THE BLADDER TUMOR;  Surgeon: Ailene Rud, MD;  [facility];  Service: Urology;  Laterality: N/A;    Soc Hx - married 50 years. He has two daughters, no grandchildren. 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 Needs assistance with dressing and bathing.     reports that he quit smoking about 59 years ago. He has a 18.00 pack-year smoking history. He has never used smokeless tobacco. He reports that he does not drink alcohol and does not use drugs.  Allergies  Allergen Reactions  . Amoxicillin Swelling, Rash and Other (See Comments)    Swelling and rash to face Has patient had a PCN reaction causing immediate rash, facial/tongue/throat swelling, SOB or lightheadedness with hypotension: Yes Has patient had a PCN reaction causing severe rash involving mucus membranes or skin necrosis: No Has patient had a PCN reaction that required hospitalization: No Has patient had a PCN reaction occurring within the last 10 years: Yes If all of the above answers are "NO", then may proceed with Cephalosporin use.    . Sulfonamide Derivatives Hives  . Sulfamethoxazole Hives and Other (See Comments)    Family History  Problem Relation Age of Onset  . Sudden death Mother        sudden cardiac arrest  . COPD Father    . Heart failure Father        CHF  . Emphysema Father   . Parkinson's disease Maternal Uncle   . Dementia Neg Hx   . Alzheimer's disease Neg Hx      Prior to Admission medications   Medication Sig Start Date End Date Taking? Authorizing Provider  acetaminophen (TYLENOL) 500 MG tablet Take 2 tablets (1,000 mg total) by mouth every 8 (eight) hours as needed for moderate pain. 11/28/19  Yes Elodia Florence., MD  clindamycin (CLEOCIN) 300 MG capsule TAKE 2 CAPSULE(S) 1-2 HOURS PRIOR TO DENTAL WORK BY MOUTH Patient taking differently: Take 600 mg by mouth See admin instructions. Take 1 to 2 hours prior to dental work. 03/06/20 03/06/21 Yes Swinteck, Aaron Edelman, MD  diclofenac (VOLTAREN) 75 MG EC tablet Take 75 mg by mouth as needed (pain). 10/03/19  Yes [provider]  docusate sodium (COLACE) 100  MG capsule Take 1 capsule (100 mg total) by mouth 2 (two) times daily. Patient taking differently: Take 100 mg by mouth as needed for mild constipation. 11/27/19  Yes Eugenie Filler, MD  latanoprost (XALATAN) 0.005 % ophthalmic solution PLACE 1 DROP INTO BOTH EYES NIGHTLY. Patient taking differently: Place 1 drop into both eyes at bedtime. 10/15/19 10/14/20 Yes Rutherford Guys, MD  levocetirizine (XYZAL) 5 MG tablet Take 5 mg by mouth every evening.    Yes [provider]  lisinopril (ZESTRIL) 10 MG tablet TAKE 1 TABLET (10 MG TOTAL) BY MOUTH DAILY. PLEASE KEEP UPCOMING APPT IN MAY 2022 BEFORE ANYMORE REFILLS. Dickson YOU 04/04/20 04/04/21 Yes Sherren Mocha, MD  nitroGLYCERIN (NITROSTAT) 0.4 MG SL tablet PLACE 1 TABLET UNDER THE TONGUE EVERY 5 MINUTES AS NEEDED FOR CHEST PAIN. Patient taking differently: Place 0.4 mg under the tongue every 5 (five) minutes x 3 doses as needed for chest pain. 05/10/19  Yes Sherren Mocha, MD  polyethylene glycol (MIRALAX / GLYCOLAX) 17 g packet Take 17 g by mouth daily as needed for mild constipation. 11/27/19  Yes Eugenie Filler, MD  tiZANidine  (ZANAFLEX) 2 MG tablet TAKE 1 TO 2 TABLETS BY MOUTH EVERY 8 HOURS AS NEEDED FOR MUSCLE SPASMS Patient taking differently: Take 2-4 mg by mouth as needed for muscle spasms. 03/17/20 03/17/21 Yes Mitchell, L.Marlou Sa, MD  vitamin B-12 (CYANOCOBALAMIN) 1000 MCG tablet Take 1,000 mcg by mouth daily.   Yes [provider]  simvastatin (ZOCOR) 20 MG tablet TAKE 1 TABLET BY MOUTH DAILY. Patient taking differently: Take 20 mg by mouth at bedtime. 05/22/19 05/21/20  Sherren Mocha, MD  traZODone (DESYREL) 50 MG tablet TAKE 0.5 TABLETS (25 MG TOTAL) BY MOUTH AT BEDTIME. Patient not taking: Reported on 05/09/2020 04/07/20 04/07/21  Ward Givens, NP  traZODone (DESYREL) 50 MG tablet TAKE 1 TABLET BY MOUTH 2 HOURS BEFORE BEDTIME Patient not taking: Reported on 05/09/2020 04/04/20 04/04/21  Alroy Dust, L.Marlou Sa, MD  memantine (NAMENDA) 10 MG tablet Start with one pill in the morning. In 2 weeks can increase to one pill twice daily.Stop for any side effects. Patient not taking: Reported on 11/22/2019 10/17/19 11/28/19  Melvenia Beam, MD    Physical Exam: Vitals:   05/09/20 1230 05/09/20 1300 05/09/20 1330 05/09/20 1400  BP: (!) 153/70 (!) 122/95 140/68 (!) 143/66  Pulse: 61 (!) 56 (!) 50 (!) 52  Resp: 17 13 16 17   Temp:      TempSrc:      SpO2: 100% 96% 100% 100%  Weight:         Vitals:   05/09/20 1230 05/09/20 1300 05/09/20 1330 05/09/20 1400  BP: (!) 153/70 (!) 122/95 140/68 (!) 143/66  Pulse: 61 (!) 56 (!) 50 (!) 52  Resp: 17 13 16 17   Temp:      TempSrc:      SpO2: 100% 96% 100% 100%  Weight:       General:  Very thin elderly man in no distress Eyes: PERRL, lids and conjunctivae normal ENMT: Mucous membranes are moist. Posterior pharynx clear of any exudate or lesions.Normal dentition.  Neck: normal, supple, no masses, no thyromegaly Respiratory: clear to auscultation bilaterally, no wheezing, no crackles. Normal respiratory effort. No accessory muscle use.  Cardiovascular: Regular rate and  rhythm, no murmurs / rubs / gallops. 2+ LE extremity edema. Trace to absent pedal pulses, very slow capillary refill toes, cold to touch. No carotid bruits.  Abdomen: no tenderness, no masses palpated. No hepatosplenomegaly.  Bowel sounds positive.  Musculoskeletal: no clubbing / cyanosis. No joint deformity upper and lower extremities. Bilateral interosseous wasting hands. Good ROM, no contractures. Decreased muscle tone.  Skin: no rashes, lesions, ulcers. No induration Neurologic: CN 2-12 nl facial symmetry, PERRLA, EOMI. Normal shoulder shrug. Speech is clear. MS- moved all extremities to command and against gravity. Sensation grossly well preserved. Psychiatric: . Alert and oriented x 3. Normal mood. Poor recall.    Labs on Admission: I have personally reviewed following labs and imaging studies  CBC: Recent Labs  Lab 05/09/20 1148 05/09/20 1151  WBC 7.6  --   NEUTROABS 5.6  --   HGB 10.8* 11.6*  HCT 34.2* 34.0*  MCV 93.2  --   PLT 355  --    Basic Metabolic Panel: Recent Labs  Lab 05/09/20 1148 05/09/20 1151  NA 138 140  K 3.9 3.9  CL 105 105  CO2 27  --   GLUCOSE 107* 102*  BUN 17 19  CREATININE 0.79 0.80  CALCIUM 9.0  --    GFR: Estimated Creatinine Clearance: 55.3 mL/min (by C-G formula based on SCr of 0.8 mg/dL). Liver Function Tests: Recent Labs  Lab 05/09/20 1148  AST 83*  ALT 151*  ALKPHOS 543*  BILITOT 0.9  PROT 6.2*  ALBUMIN 3.1*   No results for input(s): LIPASE, AMYLASE in the last 168 hours. No results for input(s): AMMONIA in the last 168 hours. Coagulation Profile: Recent Labs  Lab 05/09/20 1148  INR 0.9   Cardiac Enzymes: No results for input(s): CKTOTAL, CKMB, CKMBINDEX, TROPONINI in the last 168 hours. BNP (last 3 results) No results for input(s): PROBNP in the last 8760 hours. HbA1C: No results for input(s): HGBA1C in the last 72 hours. CBG: Recent Labs  Lab 05/09/20 1146  GLUCAP 93   Lipid Profile: No results for input(s):  CHOL, HDL, LDLCALC, TRIG, CHOLHDL, LDLDIRECT in the last 72 hours. Thyroid Function Tests: No results for input(s): TSH, T4TOTAL, FREET4, T3FREE, THYROIDAB in the last 72 hours. Anemia Panel: No results for input(s): VITAMINB12, FOLATE, FERRITIN, TIBC, IRON, RETICCTPCT in the last 72 hours. Urine analysis:    Component Value Date/Time   COLORURINE YELLOW 11/24/2019 0835   APPEARANCEUR CLOUDY (A) 11/24/2019 0835   LABSPEC 1.016 11/24/2019 0835   PHURINE 5.0 11/24/2019 0835   GLUCOSEU NEGATIVE 11/24/2019 0835   HGBUR LARGE (A) 11/24/2019 0835   BILIRUBINUR NEGATIVE 11/24/2019 0835   KETONESUR NEGATIVE 11/24/2019 0835   PROTEINUR NEGATIVE 11/24/2019 0835   NITRITE NEGATIVE 11/24/2019 0835   LEUKOCYTESUR TRACE (A) 11/24/2019 0835    Radiological Exams on Admission: DG Pelvis 1-2 Views  Result Date: 05/09/2020 CLINICAL DATA:  The patient suffered multiple falls yesterday. Initial encounter. EXAM: PELVIS - 1-2 VIEW COMPARISON:  Single-view of the pelvis 11/23/2019. FINDINGS: There is no evidence of pelvic fracture or diastasis. No pelvic bone lesions are seen. Left hip replacement is in place. The patient has mild to moderate appearing right hip osteoarthritis. IMPRESSION: No acute abnormality. Electronically Signed   By: Inge Rise M.D.   On: 05/09/2020 12:55   CT CERVICAL SPINE WO CONTRAST  Result Date: 05/09/2020 CLINICAL DATA:  Fall this morning.  Neck pain.  Initial encounter. EXAM: CT CERVICAL SPINE WITHOUT CONTRAST TECHNIQUE: Multidetector CT imaging of the cervical spine was performed without intravenous contrast. Multiplanar CT image reconstructions were also generated. COMPARISON:  11/22/2019 FINDINGS: Alignment: Normal. Skull base and vertebrae: No acute fracture. No primary bone lesion or focal pathologic process. Soft tissues  and spinal canal: No prevertebral fluid or swelling. No visible canal hematoma. Disc levels: Moderate to severe degenerative disc disease is again seen  from levels of C4-T2, which is most severe at C5-6. Moderate left-sided facet DJD is again seen at C2-3 and C3-4. these findings show no significant change compared to prior exam. Upper chest: No acute findings. Other: None. IMPRESSION: No evidence of acute cervical spine fracture or subluxation. Stable degenerative spondylosis, as described above. Electronically Signed   By: Marlaine Hind M.D.   On: 05/09/2020 12:26   DG Chest Port 1 View  Result Date: 05/09/2020 CLINICAL DATA:  Confusion. The patient has suffered multiple falls yesterday evening. EXAM: PORTABLE CHEST 1 VIEW COMPARISON:  Single-view of the chest 11/25/2019. CT chest 11/23/2019. FINDINGS: The lungs are clear. Heart size is normal. No pneumothorax or pleural fluid. No acute or focal bony abnormality. IMPRESSION: No acute disease. Electronically Signed   By: Inge Rise M.D.   On: 05/09/2020 12:53   CT HEAD CODE STROKE WO CONTRAST  Result Date: 05/09/2020 CLINICAL DATA:  Code stroke.  Altered mental status. EXAM: CT HEAD WITHOUT CONTRAST TECHNIQUE: Contiguous axial images were obtained from the base of the skull through the vertex without intravenous contrast. COMPARISON:  05/01/2020 FINDINGS: Brain: There is no evidence of an acute infarct, intracranial hemorrhage, mass, midline shift, or extra-axial fluid collection. A tiny chronic cortical/subcortical infarct is again seen in the right superior frontal gyrus. Confluent hypodensities in the cerebral white matter bilaterally are unchanged and nonspecific but compatible with severe chronic small vessel ischemic disease. There is mild-to-moderate cerebral atrophy. Vascular: Calcified atherosclerosis at the skull base. No hyperdense vessel. Skull: No fracture or suspicious osseous lesion. Sinuses/Orbits: Partially visualized mild mucosal thickening in the left maxillary sinus. Clear mastoid air cells. Bilateral cataract extraction. Other: Mild soft tissue swelling/small hematoma is involving  the right greater than left parietal scalp. ASPECTS [facility] Stroke Program Early CT Score) - Ganglionic level infarction (caudate, lentiform nuclei, internal capsule, insula, M1-M3 cortex): 7 - Supraganglionic infarction (M4-M6 cortex): 3 Total score (0-10 with 10 being normal): 10 IMPRESSION: 1. No evidence of acute intracranial abnormality. 2. ASPECTS is 10. 3. Severe chronic small vessel ischemic disease. These results were communicated to Dr. Quinn Axe at 12:02 pm on 05/09/2020 by text page via the [facility] messaging system. Electronically Signed   By: Logan Bores M.D.   On: 05/09/2020 12:02    EKG: Independently reviewed. EKG  Sinus Bradycardia, PVCs, PACs. Possible old anteroseptal injury  Assessment/Plan Active Problems:   Falls frequently   Essential hypertension   Vascular dementia with behavior disturbance (HCC)   Liver nodule   Hyperlipidemia   Multiple falls    1. Multiple falls - non-focal neuro exam but did not stand or ambulate. Initial studies negative. Question of cerebellar injury leading to ataxia. Recommendations for further studies per Neuro team. Plan MRI brain w/o contrast  CTA head and Neck  TTE   PT/OT evaluation  2. HTN- continue home meds  3. Dementia - progressive memory loss. Sundowning with nocturnal wandering. Not a candidate for Aricept or Exelon 2/2 bradycardia. INtolerant of Namenda. Does take trazodone before bed. Plan Supportive care  Will need long-term care - memory unit  4. HLD - continue meds  5. Liver nodule - patient is to see Dr. Watt Climes for GI workup  6. Code status - confirmed DNR status with wife and daughter. Patient has "living will, DNR."  7. Dispo - he was in long term care  for less than two days but taken home after fall. Family is actively looking at long term care facilties and is on the waiting lists. They have long term care insurance. TOC consult  DVT prophylaxis: lovenox  Code Status: DNR  Family Communication: wife and daughter  present during exam. Answered all questions  Disposition Plan: TBD  Consults called: Neuro - S. Ebbie Latus NP (with names) Admission status: obs - medical tele    Adella Hare MD [facility] Pager [phone]  If 7PM-7AM, please contact night-coverage [email or web address] Password [facility]  05/09/2020, 2:29 PM

## 2020-05-09 NOTE — ED Notes (Signed)
Attempted to call report to [facility], requested to call back in 5-10 minutes.

## 2020-05-09 NOTE — ED Triage Notes (Signed)
Pt arrived via [facility]. Code stroke activated prior to EMS arrival. EMS reports that family called EMS after pt exhibited increased confusion after falling 0830. Family reports fall was unwitnessed and that pt had unwitnessed fall last night around 0130 as well. Baseline reported to be confusion but able to identify himself and family members which he was unable to do so after falling. Pt alert and confused on arrival to ED with patent airway.

## 2020-05-10 ENCOUNTER — Encounter (HOSPITAL_COMMUNITY): Payer: Self-pay | Admitting: Internal Medicine

## 2020-05-10 ENCOUNTER — Other Ambulatory Visit (HOSPITAL_COMMUNITY): Payer: Medicare Other

## 2020-05-10 ENCOUNTER — Observation Stay (HOSPITAL_BASED_OUTPATIENT_CLINIC_OR_DEPARTMENT_OTHER): Payer: Medicare Other

## 2020-05-10 ENCOUNTER — Observation Stay (HOSPITAL_COMMUNITY): Payer: Medicare Other

## 2020-05-10 DIAGNOSIS — I6389 Other cerebral infarction: Secondary | ICD-10-CM | POA: Diagnosis not present

## 2020-05-10 DIAGNOSIS — K5901 Slow transit constipation: Secondary | ICD-10-CM | POA: Diagnosis not present

## 2020-05-10 DIAGNOSIS — Z515 Encounter for palliative care: Secondary | ICD-10-CM

## 2020-05-10 DIAGNOSIS — I10 Essential (primary) hypertension: Secondary | ICD-10-CM | POA: Diagnosis not present

## 2020-05-10 DIAGNOSIS — Z66 Do not resuscitate: Secondary | ICD-10-CM | POA: Diagnosis not present

## 2020-05-10 DIAGNOSIS — R4182 Altered mental status, unspecified: Secondary | ICD-10-CM | POA: Diagnosis not present

## 2020-05-10 DIAGNOSIS — E7800 Pure hypercholesterolemia, unspecified: Secondary | ICD-10-CM | POA: Diagnosis not present

## 2020-05-10 DIAGNOSIS — Z7189 Other specified counseling: Secondary | ICD-10-CM | POA: Diagnosis not present

## 2020-05-10 DIAGNOSIS — R296 Repeated falls: Secondary | ICD-10-CM | POA: Diagnosis not present

## 2020-05-10 DIAGNOSIS — G4709 Other insomnia: Secondary | ICD-10-CM | POA: Diagnosis not present

## 2020-05-10 DIAGNOSIS — F0151 Vascular dementia with behavioral disturbance: Secondary | ICD-10-CM | POA: Diagnosis not present

## 2020-05-10 DIAGNOSIS — L8990 Pressure ulcer of unspecified site, unspecified stage: Secondary | ICD-10-CM | POA: Insufficient documentation

## 2020-05-10 LAB — ECHOCARDIOGRAM COMPLETE
AR max vel: 3.28 cm2
AV Area VTI: 2.97 cm2
AV Area mean vel: 3.15 cm2
AV Mean grad: 3 mmHg
AV Peak grad: 6.9 mmHg
Ao pk vel: 1.31 m/s
Area-P 1/2: 2.87 cm2
MV VTI: 2.99 cm2
S' Lateral: 2.8 cm
Weight: 2158.74 oz

## 2020-05-10 LAB — HEPATIC FUNCTION PANEL
ALT: 123 U/L — ABNORMAL HIGH (ref 0–44)
AST: 57 U/L — ABNORMAL HIGH (ref 15–41)
Albumin: 2.8 g/dL — ABNORMAL LOW (ref 3.5–5.0)
Alkaline Phosphatase: 503 U/L — ABNORMAL HIGH (ref 38–126)
Bilirubin, Direct: 0.4 mg/dL — ABNORMAL HIGH (ref 0.0–0.2)
Indirect Bilirubin: 0.8 mg/dL (ref 0.3–0.9)
Total Bilirubin: 1.2 mg/dL (ref 0.3–1.2)
Total Protein: 5.7 g/dL — ABNORMAL LOW (ref 6.5–8.1)

## 2020-05-10 LAB — AMMONIA: Ammonia: 27 umol/L (ref 9–35)

## 2020-05-10 MED ORDER — HALOPERIDOL LACTATE 5 MG/ML IJ SOLN
2.0000 mg | Freq: Once | INTRAMUSCULAR | Status: AC
  Administered 2020-05-10: 2 mg via INTRAVENOUS

## 2020-05-10 MED ORDER — ACETAMINOPHEN 500 MG PO TABS
1000.0000 mg | ORAL_TABLET | Freq: Every day | ORAL | Status: DC
  Administered 2020-05-10: 1000 mg via ORAL
  Filled 2020-05-10: qty 2

## 2020-05-10 MED ORDER — ACETAMINOPHEN 325 MG PO TABS: 650.0000 mg | ORAL_TABLET | Freq: Three times a day (TID) | ORAL | Status: DC | PRN

## 2020-05-10 MED ORDER — OLANZAPINE 5 MG PO TBDP
2.5000 mg | ORAL_TABLET | Freq: Every day | ORAL | Status: DC
  Administered 2020-05-10: 2.5 mg via ORAL
  Filled 2020-05-10: qty 0.5

## 2020-05-10 MED ORDER — HALOPERIDOL LACTATE 5 MG/ML IJ SOLN
2.0000 mg | Freq: Four times a day (QID) | INTRAMUSCULAR | Status: DC | PRN
  Administered 2020-05-10: 1 mg via INTRAMUSCULAR
  Filled 2020-05-10 (×2): qty 1

## 2020-05-10 MED ORDER — TRAZODONE HCL 100 MG PO TABS: 100.0000 mg | ORAL_TABLET | Freq: Every day | ORAL | Status: DC

## 2020-05-10 MED ORDER — HALOPERIDOL 0.5 MG PO TABS
0.5000 mg | ORAL_TABLET | Freq: Four times a day (QID) | ORAL | Status: DC | PRN
Start: 2020-05-10 — End: 2020-05-16
  Filled 2020-05-10: qty 1

## 2020-05-10 MED ORDER — MELATONIN 3 MG PO TABS
3.0000 mg | ORAL_TABLET | Freq: Every day | ORAL | Status: DC
  Administered 2020-05-10 – 2020-05-14 (×5): 3 mg via ORAL
  Filled 2020-05-10 (×5): qty 1

## 2020-05-10 MED ORDER — TRAZODONE HCL 50 MG PO TABS: 50.0000 mg | ORAL_TABLET | Freq: Every day | ORAL | Status: DC

## 2020-05-10 NOTE — NC FL2 (Signed)
[place] LEVEL OF CARE SCREENING TOOL     IDENTIFICATION  Patient Name: Jesus Harmon Birthdate: 10/09/1929 Sex: male Admission Date (Current Location): 05/09/2020  [facility] and [place] Number:  Herbalist and Address:  [facility] [place]. [facility], [place] [address] [place], [place] [address]      Provider Number: [id number]  Attending Physician Name and Address:  Barb Merino, MD  Relative Name and Phone Number:  Sheehan Stacey, spouse, [phone]    Current Level of Care: Hospital Recommended Level of Care: [place] Prior Approval Number:    Date Approved/Denied: 11/25/19 PASRR Number: [id number]  Discharge Plan: SNF    Current Diagnoses: Patient Active Problem List   Diagnosis Date Noted  . Pressure injury of skin 05/10/2020  . Liver nodule 05/09/2020  . Falls frequently 05/09/2020  . Multiple falls 05/09/2020  . Dementia with behavioral disturbance ([place]) 11/24/2019  . Closed displaced fracture of left femoral neck ([place])   . B12 deficiency 11/23/2019  . Anemia 11/23/2019  . Pulmonary nodule, right 11/23/2019  . Hip fracture ([place]) 11/22/2019  . Pain due to onychomycosis of toenails of both feet 11/05/2019  . Vascular dementia with behavior disturbance ([place]) 10/17/2019  . Dementia without behavioral disturbance ([place]) 05/13/2019  . Bilateral impacted cerumen 11/29/2018  . Presbycusis of both ears 11/29/2018  . Vasomotor rhinitis 11/29/2018  . S/P inguinal hernia repair 12/22/2016  . CAD (coronary artery disease) 06/16/2016  . Coronary artery disease involving native coronary artery of native heart with angina pectoris ([place]) 06/16/2016  . Benign hypertrophy of prostate 03/04/2014  . Hyperlipidemia 02/17/2010  . Essential hypertension 02/17/2010  . CAD, NATIVE VESSEL  (06/16/2016): a. sev sten of mid right coronary art PTCA and DES , severe prox LAD stenosis unsucc PTCA, widely patent left main and left circ  11/14/2009  . FATIGUE 11/12/2009  . ARM NUMBNESS 11/12/2009  . DYSPNEA 11/12/2009    Orientation RESPIRATION BLADDER Height & Weight        Normal Incontinent Weight: 134 lb 14.7 oz (61.2 kg) Height:     BEHAVIORAL SYMPTOMS/MOOD NEUROLOGICAL BOWEL NUTRITION STATUS      Incontinent    AMBULATORY STATUS COMMUNICATION OF NEEDS Skin   Limited Assist Verbally Normal                       Personal Care Assistance Level of Assistance  Bathing,Feeding,Dressing Bathing Assistance: Maximum assistance Feeding assistance: Limited assistance Dressing Assistance: Maximum assistance     Functional Limitations Info             SPECIAL CARE FACTORS FREQUENCY  PT (By licensed PT),OT (By licensed OT)     PT Frequency: 5x weekly OT Frequency: 5x weekly            Contractures Contractures Info: Present    Additional Factors Info  Code Status,Allergies Code Status Info: DNR Allergies Info: Amoxicillin, sulfar derivatives           Current Medications (05/10/2020):  This is the current hospital active medication list Current Facility-Administered Medications  Medication Dose Route Frequency Provider Last Rate Last Admin  . 0.9 %  sodium chloride infusion  250 mL Intravenous PRN Norins, Heinz Knuckles, MD      . acetaminophen (TYLENOL) tablet 1,000 mg  1,000 mg Oral Q8H PRN Norins, Heinz Knuckles, MD      . aspirin EC tablet 81 mg  81 mg Oral Daily Rikki Spearing, NP      .  cetirizine (ZYRTEC) tablet 10 mg  10 mg Oral QHS Norins, Heinz Knuckles, MD   10 mg at 05/09/20 2215  . docusate sodium (COLACE) capsule 100 mg  100 mg Oral Daily PRN Norins, Heinz Knuckles, MD      . enoxaparin (LOVENOX) injection 40 mg  40 mg Subcutaneous Q24H Norins, Heinz Knuckles, MD   40 mg at 05/09/20 1802  . haloperidol lactate (HALDOL) injection 2 mg  2 mg Intramuscular Q6H PRN Reubin Milan, MD       Or  . haloperidol (HALDOL) tablet 0.5 mg  0.5 mg Oral Q6H PRN Reubin Milan, MD      . lisinopril  (ZESTRIL) tablet 10 mg  10 mg Oral Daily Norins, Heinz Knuckles, MD   10 mg at 05/09/20 1752  . melatonin tablet 3 mg  3 mg Oral QHS Barb Merino, MD      . nitroGLYCERIN (NITROSTAT) SL tablet 0.4 mg  0.4 mg Sublingual Q5 Min x 3 PRN Norins, Heinz Knuckles, MD      . sodium chloride flush (NS) 0.9 % injection 3 mL  3 mL Intravenous Once Valarie Merino, MD      . sodium chloride flush (NS) 0.9 % injection 3 mL  3 mL Intravenous Q12H Norins, Heinz Knuckles, MD   3 mL at 05/09/20 2217  . sodium chloride flush (NS) 0.9 % injection 3 mL  3 mL Intravenous PRN Norins, Heinz Knuckles, MD      . traZODone (DESYREL) tablet 50 mg  50 mg Oral QHS Barb Merino, MD      . vitamin B-12 (CYANOCOBALAMIN) tablet 1,000 mcg  1,000 mcg Oral Daily Norins, Heinz Knuckles, MD   1,000 mcg at 05/09/20 1752     Discharge Medications: Please see discharge summary for a list of discharge medications.  Relevant Imaging Results:  Relevant Lab Results:   Additional Information SSN: [place], [place]

## 2020-05-10 NOTE — Progress Notes (Signed)
[facility] night shift.  The staff reports that Haldol 1 mg IVP has not been sufficient to control the patient's dementia induced restlessness.  No QT prolongation on EKG.  Haldol 2 mg IVP x1 dose ordered now.  I will increase the as needed Haldol from 1 mg to 2 mg IVP every 6 hours.  Tennis Must, MD.

## 2020-05-10 NOTE — Progress Notes (Signed)
PROGRESS NOTE    Jesus Harmon  [id number] DOB: 29-Sep-1929 DOA: 05/09/2020 PCP: Alroy Dust, L.Marlou Sa, MD    Brief Narrative:  87 year old gentleman with history of progressive vascular dementia, recent fall and sacral fracture, total hip replacement in 2021, sinus bradycardia, hypertension and currently with increasing difficulty to manage symptoms at home brought from home with multiple falls, also found to have slurred speech, confusion and not recognizing his wife.  EMS activated code stroke and patient was brought to ER. In the emergency room hemodynamically stable.  Code stroke initiated secondary to fall and altered mental status.  CT head negative.  CT neck negative.  Skeletal survey negative.  Admitted due to neurological changes mostly related to altered mental status.   Assessment & Plan:   Active Problems:   Hyperlipidemia   Essential hypertension   Vascular dementia with behavior disturbance (HCC)   Liver nodule   Falls frequently   Multiple falls   Pressure injury of skin  Multiple falls/progressive debility and dementia/acute encephalopathy in the setting of underlying advanced vascular dementia: MRI of the brain with severe chronic microvascular disease.  MRA of the head and neck was essentially normal.  No evidence of a stroke.  TIA and stroke ruled out. Urinalysis normal.  No evidence of acute infection.   EEG was done, less likely a seizure.  No indication for treatment. Patient with progressive dementia, his symptoms are likely related to advanced Alzheimer's dementia. He was seen by neurology in the office, could not tolerate therapeutics.  Plan: Work with PT OT to determine level of mobility. Haldol by mouth as needed for symptom control, will start patient on trazodone and melatonin at night to try to regulate day and night cycle and help symptoms at night. Unrestricted visitor, unrestricted visiting hours due to altered mental status and help nighttime  symptoms with family present. Palliative care consultation to discuss palliation/may qualify for home hospice if going home. May need placement, memory care unit.  Social worker talking with family.  Hypertension: Stable.  Hyperlipidemia: Stable.  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  MRI was not diagnostic.  Does not look like metastatic cancer, however possible. Given patient's advanced frailty and deteriorating mental status, it is wise to prevent sedation and anesthesia.  I discussed this with patient's family that knowing the diagnosis will not help any treatment as he is not able to tolerate any treatment/chemotherapy or surgery at this stage of life.  If in case his mobility and mental status improves, he may benefit with diagnostic biopsy.  Family agreed.   DVT prophylaxis: enoxaparin (LOVENOX) injection 40 mg Start: 05/09/20 1800   Code Status: DNR Family Communication: Daughter on the phone Disposition Plan: Status is: Observation  The patient will require care spanning > 2 midnights and should be moved to inpatient because: Unsafe d/c plan  Dispo: The patient is from: Home              Anticipated d/c is to: SNF              Patient currently is not medically stable to d/c.   Difficult to place patient No         Consultants:   Palliative medicine  Procedures:   None  Antimicrobials:   None   Subjective: Patient seen and examined.  Overnight events noted.  Given 2 doses of Haldol with some improvement of symptoms and able  to stay quiet.  Patient himself is pleasantly confused, he denies any complaints.  He knows he is in [facility], he does not know he is in the hospital.  Denies any pain, nausea or vomiting.  Called and discussed with patient's daughter.  Objective: Vitals:   05/09/20 1951 05/09/20 2343 05/10/20 0544 05/10/20 1149  BP: (!)  158/70 (!) 141/76 (!) 147/82 134/63  Pulse: 79 60 73 73  Resp: 17 16 18 18   Temp: 98.6 F (37 C) 98.4 F (36.9 C) 98.7 F (37.1 C) 97.8 F (36.6 C)  TempSrc: Oral Oral Oral Oral  SpO2: 97% 100% 100% 100%  Weight:        Intake/Output Summary (Last 24 hours) at 05/10/2020 1325 Last data filed at 05/10/2020 0858 Gross per 24 hour  Intake 240 ml  Output 2000 ml  Net -1760 ml   Filed Weights   05/09/20 1100  Weight: 61.2 kg    Examination:  General exam: Appears calm and comfortable at rest.  Currently sleepy. Patient is pleasant at this time, he is confused.  Is alert when woken up.  Orientation x0. Respiratory system: Clear to auscultation. Respiratory effort normal. Cardiovascular system: S1 & S2 heard, RRR. No JVD, murmurs, rubs, gallops or clicks. No pedal edema. Gastrointestinal system: Abdomen is nondistended, soft and nontender. No organomegaly or masses felt. Normal bowel sounds heard. Central nervous system: Moves all extremities.   Data Reviewed: I have personally reviewed following labs and imaging studies  CBC: Recent Labs  Lab 05/09/20 1148 05/09/20 1151  WBC 7.6  --   NEUTROABS 5.6  --   HGB 10.8* 11.6*  HCT 34.2* 34.0*  MCV 93.2  --   PLT 355  --    Basic Metabolic Panel: Recent Labs  Lab 05/09/20 1148 05/09/20 1151  NA 138 140  K 3.9 3.9  CL 105 105  CO2 27  --   GLUCOSE 107* 102*  BUN 17 19  CREATININE 0.79 0.80  CALCIUM 9.0  --    GFR: Estimated Creatinine Clearance: 55.3 mL/min (by C-G formula based on SCr of 0.8 mg/dL). Liver Function Tests: Recent Labs  Lab 05/09/20 1148 05/10/20 0415  AST 83* 57*  ALT 151* 123*  ALKPHOS 543* 503*  BILITOT 0.9 1.2  PROT 6.2* 5.7*  ALBUMIN 3.1* 2.8*   No results for input(s): LIPASE, AMYLASE in the last 168 hours. Recent Labs  Lab 05/10/20 0415  AMMONIA 27   Coagulation Profile: Recent Labs  Lab 05/09/20 1148  INR 0.9   Cardiac Enzymes: No results for input(s): CKTOTAL, CKMB,  CKMBINDEX, TROPONINI in the last 168 hours. BNP (last 3 results) No results for input(s): PROBNP in the last 8760 hours. HbA1C: Recent Labs    05/09/20 1148  HGBA1C 5.8*   CBG: Recent Labs  Lab 05/09/20 1146  GLUCAP 93   Lipid Profile: Recent Labs    05/09/20 1148  CHOL 227*  HDL 99  LDLCALC 118*  TRIG 52  CHOLHDL 2.3   Thyroid Function Tests: No results for input(s): TSH, T4TOTAL, FREET4, T3FREE, THYROIDAB in the last 72 hours. Anemia Panel: No results for input(s): VITAMINB12, FOLATE, FERRITIN, TIBC, IRON, RETICCTPCT in the last 72 hours. Sepsis Labs: No results for input(s): PROCALCITON, LATICACIDVEN in the last 168 hours.  Recent Results (from the past 240 hour(s))  Resp Panel by RT-PCR (Flu A&B, Covid) Nasopharyngeal Swab     Status: None   Collection Time: 05/09/20 11:49 AM   Specimen: Nasopharyngeal Swab; Nasopharyngeal(NP) swabs  in vial transport medium  Result Value Ref Range Status   SARS Coronavirus 2 by RT PCR NEGATIVE NEGATIVE Final    Comment: (NOTE) SARS-CoV-2 target nucleic acids are NOT DETECTED.  The SARS-CoV-2 RNA is generally detectable in upper respiratory specimens during the acute phase of infection. The lowest concentration of SARS-CoV-2 viral copies this assay can detect is 138 copies/mL. A negative result does not preclude SARS-Cov-2 infection and should not be used as the sole basis for treatment or other patient management decisions. A negative result may occur with  improper specimen collection/handling, submission of specimen other than nasopharyngeal swab, presence of viral mutation(s) within the areas targeted by this assay, and inadequate number of viral copies(<138 copies/mL). A negative result must be combined with clinical observations, patient history, and epidemiological information. The expected result is Negative.  Fact Sheet for Patients:  EntrepreneurPulse.com.au  Fact Sheet for Healthcare Providers:   IncredibleEmployment.be  This test is no t yet approved or cleared by the [place] FDA and  has been authorized for detection and/or diagnosis of SARS-CoV-2 by FDA under an Emergency Use Authorization (EUA). This EUA will remain  in effect (meaning this test can be used) for the duration of the COVID-19 declaration under Section 564(b)(1) of the Act, 21 U.S.C.section 360bbb-3(b)(1), unless the authorization is terminated  or revoked sooner.       Influenza A by PCR NEGATIVE NEGATIVE Final   Influenza B by PCR NEGATIVE NEGATIVE Final    Comment: (NOTE) The Xpert Xpress SARS-CoV-2/FLU/RSV plus assay is intended as an aid in the diagnosis of influenza from Nasopharyngeal swab specimens and should not be used as a sole basis for treatment. Nasal washings and aspirates are unacceptable for Xpert Xpress SARS-CoV-2/FLU/RSV testing.  Fact Sheet for Patients: EntrepreneurPulse.com.au  Fact Sheet for Healthcare Providers: IncredibleEmployment.be  This test is not yet approved or cleared by the [place] FDA and has been authorized for detection and/or diagnosis of SARS-CoV-2 by FDA under an Emergency Use Authorization (EUA). This EUA will remain in effect (meaning this test can be used) for the duration of the COVID-19 declaration under Section 564(b)(1) of the Act, 21 U.S.C. section 360bbb-3(b)(1), unless the authorization is terminated or revoked.  Performed at [facility], [place] [address], [place], [place] [address]          Radiology Studies: DG Pelvis 1-2 Views  Result Date: 05/09/2020 CLINICAL DATA:  The patient suffered multiple falls yesterday. Initial encounter. EXAM: PELVIS - 1-2 VIEW COMPARISON:  Single-view of the pelvis 11/23/2019. FINDINGS: There is no evidence of pelvic fracture or diastasis. No pelvic bone lesions are seen. Left hip replacement is in place. The patient has mild to moderate  appearing right hip osteoarthritis. IMPRESSION: No acute abnormality. Electronically Signed   By: Inge Rise M.D.   On: 05/09/2020 12:55   CT CERVICAL SPINE WO CONTRAST  Result Date: 05/09/2020 CLINICAL DATA:  Fall this morning.  Neck pain.  Initial encounter. EXAM: CT CERVICAL SPINE WITHOUT CONTRAST TECHNIQUE: Multidetector CT imaging of the cervical spine was performed without intravenous contrast. Multiplanar CT image reconstructions were also generated. COMPARISON:  11/22/2019 FINDINGS: Alignment: Normal. Skull base and vertebrae: No acute fracture. No primary bone lesion or focal pathologic process. Soft tissues and spinal canal: No prevertebral fluid or swelling. No visible canal hematoma. Disc levels: Moderate to severe degenerative disc disease is again seen from levels of C4-T2, which is most severe at C5-6. Moderate left-sided facet DJD is again seen at  C2-3 and C3-4. these findings show no significant change compared to prior exam. Upper chest: No acute findings. Other: None. IMPRESSION: No evidence of acute cervical spine fracture or subluxation. Stable degenerative spondylosis, as described above. Electronically Signed   By: Marlaine Hind M.D.   On: 05/09/2020 12:26   MR ANGIO HEAD WO CONTRAST  Result Date: 05/09/2020 CLINICAL DATA:  Code stroke presentation.  Altered mental status. EXAM: MRI HEAD WITHOUT CONTRAST MRA HEAD WITHOUT CONTRAST MRA NECK WITHOUT CONTRAST TECHNIQUE: Multiplanar, multiecho pulse sequences of the brain and surrounding structures were obtained without intravenous contrast. Angiographic images of the Circle of Willis were obtained using MRA technique without intravenous contrast. Angiographic images of the neck were obtained using MRA technique without intravenous contrast. Carotid stenosis measurements (when applicable) are obtained utilizing NASCET criteria, using the distal internal carotid diameter as the denominator. COMPARISON:  CT study same day FINDINGS: MRI  HEAD FINDINGS Brain: Diffusion imaging does not show any acute or subacute infarction. The brainstem and cerebellum are normal. Cerebral hemispheres show age related volume loss with extensive chronic small-vessel ischemic change within the deep and subcortical white matter. No cortical or large vessel territory infarction. No mass lesion, hemorrhage, hydrocephalus or extra-axial collection. Vascular: Major vessels at the base of the brain show flow. Skull and upper cervical spine: Negative Sinuses/Orbits: Clear/normal Other: None MRA HEAD FINDINGS Both internal carotid arteries are widely patent into the brain. No siphon stenosis. The anterior and middle cerebral vessels are patent without proximal stenosis, aneurysm or vascular malformation. Both vertebral arteries are widely patent to the basilar. No basilar stenosis. Posterior circulation branch vessels appear normal. MRA NECK FINDINGS Both common carotid arteries are patent to the bifurcation region. Carotid bifurcation and cervical ICA on the left are normal. On the right, there is some atherosclerotic plaque at the distal bulb region with stenosis of 30%. Cervical ICA widely patent beyond that. Both vertebral artery origins are patent. Both vertebral arteries appear patent and normal through the cervical region with antegrade flow. IMPRESSION: 1. No acute intracranial finding. Extensive chronic small-vessel ischemic change of the cerebral hemispheric white matter. 2. No intracranial large or medium vessel occlusion or correctable proximal stenosis. 3. Atherosclerotic plaque at the distal ICA bulb region on the right with 30% stenosis. Left carotid bifurcation widely patent. Electronically Signed   By: Nelson Chimes M.D.   On: 05/09/2020 16:55   MR ANGIO NECK WO CONTRAST  Result Date: 05/09/2020 CLINICAL DATA:  Code stroke presentation.  Altered mental status. EXAM: MRI HEAD WITHOUT CONTRAST MRA HEAD WITHOUT CONTRAST MRA NECK WITHOUT CONTRAST TECHNIQUE:  Multiplanar, multiecho pulse sequences of the brain and surrounding structures were obtained without intravenous contrast. Angiographic images of the Circle of Willis were obtained using MRA technique without intravenous contrast. Angiographic images of the neck were obtained using MRA technique without intravenous contrast. Carotid stenosis measurements (when applicable) are obtained utilizing NASCET criteria, using the distal internal carotid diameter as the denominator. COMPARISON:  CT study same day FINDINGS: MRI HEAD FINDINGS Brain: Diffusion imaging does not show any acute or subacute infarction. The brainstem and cerebellum are normal. Cerebral hemispheres show age related volume loss with extensive chronic small-vessel ischemic change within the deep and subcortical white matter. No cortical or large vessel territory infarction. No mass lesion, hemorrhage, hydrocephalus or extra-axial collection. Vascular: Major vessels at the base of the brain show flow. Skull and upper cervical spine: Negative Sinuses/Orbits: Clear/normal Other: None MRA HEAD FINDINGS Both internal carotid arteries are widely patent  into the brain. No siphon stenosis. The anterior and middle cerebral vessels are patent without proximal stenosis, aneurysm or vascular malformation. Both vertebral arteries are widely patent to the basilar. No basilar stenosis. Posterior circulation branch vessels appear normal. MRA NECK FINDINGS Both common carotid arteries are patent to the bifurcation region. Carotid bifurcation and cervical ICA on the left are normal. On the right, there is some atherosclerotic plaque at the distal bulb region with stenosis of 30%. Cervical ICA widely patent beyond that. Both vertebral artery origins are patent. Both vertebral arteries appear patent and normal through the cervical region with antegrade flow. IMPRESSION: 1. No acute intracranial finding. Extensive chronic small-vessel ischemic change of the cerebral  hemispheric white matter. 2. No intracranial large or medium vessel occlusion or correctable proximal stenosis. 3. Atherosclerotic plaque at the distal ICA bulb region on the right with 30% stenosis. Left carotid bifurcation widely patent. Electronically Signed   By: Nelson Chimes M.D.   On: 05/09/2020 16:55   MR BRAIN WO CONTRAST  Result Date: 05/09/2020 CLINICAL DATA:  Code stroke presentation.  Altered mental status. EXAM: MRI HEAD WITHOUT CONTRAST MRA HEAD WITHOUT CONTRAST MRA NECK WITHOUT CONTRAST TECHNIQUE: Multiplanar, multiecho pulse sequences of the brain and surrounding structures were obtained without intravenous contrast. Angiographic images of the Circle of Willis were obtained using MRA technique without intravenous contrast. Angiographic images of the neck were obtained using MRA technique without intravenous contrast. Carotid stenosis measurements (when applicable) are obtained utilizing NASCET criteria, using the distal internal carotid diameter as the denominator. COMPARISON:  CT study same day FINDINGS: MRI HEAD FINDINGS Brain: Diffusion imaging does not show any acute or subacute infarction. The brainstem and cerebellum are normal. Cerebral hemispheres show age related volume loss with extensive chronic small-vessel ischemic change within the deep and subcortical white matter. No cortical or large vessel territory infarction. No mass lesion, hemorrhage, hydrocephalus or extra-axial collection. Vascular: Major vessels at the base of the brain show flow. Skull and upper cervical spine: Negative Sinuses/Orbits: Clear/normal Other: None MRA HEAD FINDINGS Both internal carotid arteries are widely patent into the brain. No siphon stenosis. The anterior and middle cerebral vessels are patent without proximal stenosis, aneurysm or vascular malformation. Both vertebral arteries are widely patent to the basilar. No basilar stenosis. Posterior circulation branch vessels appear normal. MRA NECK FINDINGS  Both common carotid arteries are patent to the bifurcation region. Carotid bifurcation and cervical ICA on the left are normal. On the right, there is some atherosclerotic plaque at the distal bulb region with stenosis of 30%. Cervical ICA widely patent beyond that. Both vertebral artery origins are patent. Both vertebral arteries appear patent and normal through the cervical region with antegrade flow. IMPRESSION: 1. No acute intracranial finding. Extensive chronic small-vessel ischemic change of the cerebral hemispheric white matter. 2. No intracranial large or medium vessel occlusion or correctable proximal stenosis. 3. Atherosclerotic plaque at the distal ICA bulb region on the right with 30% stenosis. Left carotid bifurcation widely patent. Electronically Signed   By: Nelson Chimes M.D.   On: 05/09/2020 16:55   DG Chest Port 1 View  Result Date: 05/09/2020 CLINICAL DATA:  Confusion. The patient has suffered multiple falls yesterday evening. EXAM: PORTABLE CHEST 1 VIEW COMPARISON:  Single-view of the chest 11/25/2019. CT chest 11/23/2019. FINDINGS: The lungs are clear. Heart size is normal. No pneumothorax or pleural fluid. No acute or focal bony abnormality. IMPRESSION: No acute disease. Electronically Signed   By: Inge Rise M.D.  On: 05/09/2020 12:53   CT HEAD CODE STROKE WO CONTRAST  Result Date: 05/09/2020 CLINICAL DATA:  Code stroke.  Altered mental status. EXAM: CT HEAD WITHOUT CONTRAST TECHNIQUE: Contiguous axial images were obtained from the base of the skull through the vertex without intravenous contrast. COMPARISON:  05/01/2020 FINDINGS: Brain: There is no evidence of an acute infarct, intracranial hemorrhage, mass, midline shift, or extra-axial fluid collection. A tiny chronic cortical/subcortical infarct is again seen in the right superior frontal gyrus. Confluent hypodensities in the cerebral white matter bilaterally are unchanged and nonspecific but compatible with severe chronic  small vessel ischemic disease. There is mild-to-moderate cerebral atrophy. Vascular: Calcified atherosclerosis at the skull base. No hyperdense vessel. Skull: No fracture or suspicious osseous lesion. Sinuses/Orbits: Partially visualized mild mucosal thickening in the left maxillary sinus. Clear mastoid air cells. Bilateral cataract extraction. Other: Mild soft tissue swelling/small hematoma is involving the right greater than left parietal scalp. ASPECTS [facility] Stroke Program Early CT Score) - Ganglionic level infarction (caudate, lentiform nuclei, internal capsule, insula, M1-M3 cortex): 7 - Supraganglionic infarction (M4-M6 cortex): 3 Total score (0-10 with 10 being normal): 10 IMPRESSION: 1. No evidence of acute intracranial abnormality. 2. ASPECTS is 10. 3. Severe chronic small vessel ischemic disease. These results were communicated to Dr. Quinn Axe at 12:02 pm on 05/09/2020 by text page via the [facility] messaging system. Electronically Signed   By: Logan Bores M.D.   On: 05/09/2020 12:02        Scheduled Meds: . aspirin EC  81 mg Oral Daily  . cetirizine  10 mg Oral QHS  . enoxaparin (LOVENOX) injection  40 mg Subcutaneous Q24H  . lisinopril  10 mg Oral Daily  . melatonin  3 mg Oral QHS  . sodium chloride flush  3 mL Intravenous Once  . sodium chloride flush  3 mL Intravenous Q12H  . traZODone  50 mg Oral QHS  . vitamin B-12  1,000 mcg Oral Daily   Continuous Infusions: . sodium chloride       LOS: 0 days    Time spent: 35 minutes    Barb Merino, MD [facility] Pager [phone]

## 2020-05-10 NOTE — Procedures (Signed)
Patient Name: Jesus Harmon  MRN: [id number]  Epilepsy Attending: Lora Havens  Referring Physician/Provider: Dr Rosalin Hawking Date: 05/10/2020 Duration: 26.29 mins  Patient history: 87 year old male with vascular dementia, frequent falls, and longstanding history of balance problems who presented today as a Code Stroke for evaluation of confusion and aphasia after sustaining a fall (possibly hitting head). EEG to evaluate for seizure.  Level of alertness: Awake  AEDs during EEG study: None  Technical aspects: This EEG study was done with scalp electrodes positioned according to the 10-20 International system of electrode placement. Electrical activity was acquired at a sampling rate of 500Hz  and reviewed with a high frequency filter of 70Hz  and a low frequency filter of 1Hz . EEG data were recorded continuously and digitally stored.   Description: The posterior dominant rhythm consists of 8-9 Hz activity of moderate voltage (25-35 uV) seen predominantly in posterior head regions, symmetric and reactive to eye opening and eye closing. Drowsiness was characterized by attenuation of the posterior background rhythm. Hyperventilation and photic stimulation were not performed.     Of note, study was technically difficult due to significant electrode artifact as patient was confused and pulling electrodes.   IMPRESSION: This technically difficult study is within normal limits. No seizures or epileptiform discharges were seen throughout the recording.  If concern for ictal-interictal activity persists, can consider prolonged monitoring  Wilgus Deyton Barbra Sarks

## 2020-05-10 NOTE — Progress Notes (Signed)
  Echocardiogram 2D Echocardiogram has been performed.  Jesus Harmon 05/10/2020, 2:27 PM

## 2020-05-10 NOTE — Progress Notes (Signed)
EEG complete - results pending 

## 2020-05-10 NOTE — Consult Note (Signed)
Consultation Note Date: 05/10/2020   Patient Name: Jesus Harmon  DOB: [birth year]  MRN: [id number]  Age / Sex: 87 y.o., male  PCP: Alroy Dust, L.Marlou Sa, MD Referring Physician: Barb Merino, MD  Reason for Consultation: Establishing goals of care and Non pain symptom management  HPI/Patient Profile: 87 y.o. male  with past medical history of vascular dementia, CAD, diastolic heart failure, HTN, HLD, recent fall with sacral fracture 05/01/20, fall with left hip fracture s/p total hip 11/23/19, liver nodule (undergoing work up with Dr. Watt Climes), kidney stones, bladder cancer admitted on 05/09/2020 with multiple falls and altered mental status. MRI shows no evidence of stroke but advanced vascular disease.   Clinical Assessment and Goals of Care: I met today at Jesus Harmon bedside with his wife and 2 daughters. They have been caring for Jesus Harmon at home but this is becoming increasingly more difficult as he is not sleeping at night. Not really sleeping much in day either. He has increasing falls and injuries. Family explain that his status has been worsening with insomnia and wondering more since Feb-March. He declined after hip fracture Nov 2021 but did have some improvement at home until [facility]. They attempted placement at memory care but fell with resultant sacral fracture within 18 hours of being in facility so he returned back to home.   Goals at this time are to better manage insomnia so that he will be safer and hopefully have less falls if he is getting some sleep. Discussed plan with family to give scheduled Tylenol (likely to have contributing pain and he is unable to express secondary to dementia. Also will add low dose Zyprexa as this was reported to be helpful during admission for hip fracture last Nov 2021. Discussed risks vs benefits and family agree with plan.   They all look to daughter, [name], for  guidance on medical decisions as she is a Marine scientist. She confirms DNR status. We did discuss other issues that could come up with progressing dementia is feeding tube and she reports that she does not feel this would be desired. They are not interested in pursuing invasive or aggressive measures but measures to assist with reversible conditions that can add to his quality of life. I provided Hard Choices booklet.   All questions/concerns addressed. Emotional support provided.   Primary Decision Maker Wife and 2 daughters make decisions together but they look to [facility] for guidance.     SUMMARY OF RECOMMENDATIONS   - Goals of care clear - Care at home becoming difficult and family continue to search for long term care option that is a good fit for him - Interested in short term rehab  Code Status/Advance Care Planning:  DNR   Symptom Management:   Insomnia:   I have to believe that there has to be some element of pain/discomfort with his recent history of falls and injury/fracture. Will add scheduled Tylenol 1000 mg qhs.   Will also trial Zyprexa 2.5 mg qhs. QTc stable 428.   Will d/c trazodone.  Palliative Prophylaxis:   Delirium Protocol, Frequent Pain Assessment and Turn Reposition  Prognosis:   Difficult to determine. Underlying progressing dementia but not yet to end stage or hospice eligibility.   Discharge Planning: [facility] for rehab with Palliative care service follow-up      Primary Diagnoses: Present on Admission: . Hyperlipidemia . Essential hypertension . Vascular dementia with behavior disturbance ([place]) . Liver nodule   I have reviewed the medical record, interviewed the patient and family, and examined the patient. The following aspects are pertinent.  Past Medical History:  Diagnosis Date  . Arthritis    back and neck (06/16/2016)  . Bladder cancer ([place])   . Blockage of coronary artery of heart (HCC)    "widow maker", had stenting of the  R side but L main is still blocked   . CAD (coronary artery disease)    a. LHC 10/2009:  pLAD 50%, small D1 70-80% (too small for PCI), EF 65% => med Rx  b. Myoview 10/2009: EF 65%, no ischemia;  c.  ETT-MV 3/14:  No ischemia, EF 63%  . Cancer [facility])    sin cancer removed nose   . Diverticulosis   . Glaucoma   . History of echocardiogram    Echo 02/2018: EF 17-00, +diastolic dysfunction, no RWMA, normal RVSF, trivial MR  . History of kidney stones    "passed them"  . HLD (hyperlipidemia)    "on RX; never had high cholestrol" (06/16/2016)  . HTN (hypertension)    "on RX; never HTN" (06/16/2016)  . Ischemic vascular dementia ([place])    last OV Neuro 03/19/20  MMSE 15/30  . Liver nodule 05/01/2020   subcapsular nodule right hepatic lobe with elevated LFTs  . Migraine    "in high school" (06/16/2016)   Social History   Socioeconomic History  . Marital status: Married    Spouse name: Not on file  . Number of children: 2  . Years of education: 27  . Highest education level: Not on file  Occupational History  . Occupation: retired  . Occupation: part time car auctioneer  Tobacco Use  . Smoking status: Former Smoker    Packs/day: 1.00    Years: 18.00    Pack years: 18.00    Quit date: 1963    Years since quitting: 59.3  . Smokeless tobacco: Never Used  Vaping Use  . Vaping Use: Never used  Substance and Sexual Activity  . Alcohol use: Never  . Drug use: Never  . Sexual activity: Not on file  Other Topics Concern  . Not on file  Social History Narrative   Lives at home with wife   Caffeine: 1/2 caf and 1/2 decaf maybe 2 cups/day   Social Determinants of Health   Financial Resource Strain: Not on file  Food Insecurity: Not on file  Transportation Needs: Not on file  Physical Activity: Not on file  Stress: Not on file  Social Connections: Not on file   Family History  Problem Relation Age of Onset  . Sudden death Mother        sudden cardiac arrest  . COPD Father   . Heart  failure Father        CHF  . Emphysema Father   . Parkinson's disease Maternal Uncle   . Dementia Neg Hx   . Alzheimer's disease Neg Hx    Scheduled Meds: . aspirin EC  81 mg Oral Daily  . cetirizine  10 mg Oral QHS  .  enoxaparin (LOVENOX) injection  40 mg Subcutaneous Q24H  . lisinopril  10 mg Oral Daily  . melatonin  3 mg Oral QHS  . sodium chloride flush  3 mL Intravenous Once  . sodium chloride flush  3 mL Intravenous Q12H  . traZODone  50 mg Oral QHS  . vitamin B-12  1,000 mcg Oral Daily   Continuous Infusions: . sodium chloride     PRN Meds:.sodium chloride, acetaminophen, docusate sodium, haloperidol **OR** haloperidol lactate, nitroGLYCERIN, sodium chloride flush Allergies  Allergen Reactions  . Amoxicillin Swelling, Rash and Other (See Comments)    Swelling and rash to face Has patient had a PCN reaction causing immediate rash, facial/tongue/throat swelling, SOB or lightheadedness with hypotension: Yes Has patient had a PCN reaction causing severe rash involving mucus membranes or skin necrosis: No Has patient had a PCN reaction that required hospitalization: No Has patient had a PCN reaction occurring within the last 10 years: Yes If all of the above answers are "NO", then may proceed with Cephalosporin use.    . Sulfonamide Derivatives Hives  . Sulfamethoxazole Hives and Other (See Comments)   Review of Systems  Unable to perform ROS: Dementia    Physical Exam Vitals and nursing note reviewed.  Constitutional:      General: He is not in acute distress.    Appearance: He is ill-appearing.     Comments: Temporal muscle wasting  Cardiovascular:     Rate and Rhythm: Normal rate.     Comments: Loletha Grayer 50s at times.  Pulmonary:     Effort: Pulmonary effort is normal.  Abdominal:     General: Abdomen is flat.  Neurological:     Mental Status: He is alert. He is confused.     Comments: He is able to converse in full sentences and even recall some events to  relay but often confused and not able to give reliable descriptions, details, or answers to questions.      Vital Signs: BP (!) 147/82 (BP Location: Right Arm)   Pulse 73   Temp 98.7 F (37.1 C) (Oral)   Resp 18   Wt 61.2 kg   SpO2 100%   BMI 18.82 kg/m  Pain Scale: 0-10   Pain Score: 0-No pain   SpO2: SpO2: 100 % O2 Device:SpO2: 100 % O2 Flow Rate: .   IO: Intake/output summary:   Intake/Output Summary (Last 24 hours) at 05/10/2020 1140 Last data filed at 05/10/2020 0858 Gross per 24 hour  Intake 240 ml  Output 2000 ml  Net -1760 ml    LBM:   Baseline Weight: Weight: 61.2 kg Most recent weight: Weight: 61.2 kg     Palliative Assessment/Data:     Time In: 1300 Time Out: 1410 Time Total: 70 min Greater than 50%  of this time was spent counseling and coordinating care related to the above assessment and plan.  Signed by: Vinie Sill, NP Palliative Medicine Team Pager # [phone] (M-F 8a-5p) Team Phone # [phone] (Nights/Weekends)

## 2020-05-10 NOTE — Progress Notes (Addendum)
Pt still awaiting eeg. This rn called on call eeg tech at [phone] listed in [place], voice message states person not available and call automatically forwards to an automated voice messaging system [phone] for callers to leave a message, this rn left message regarding order for eeg. This rn placed second call to eeg dept at [phone] and left message regarding order for eeg. Contacted [facility] for assistance. Will call service response center at [phone] per [facility] suggestion to check if additional contact number are available for eeg

## 2020-05-10 NOTE — Progress Notes (Signed)
With STROKE TEAM PROGRESS NOTE   INTERVAL HISTORY His wife and daughters are at the bedside.  Pt sitting in chair, fidget with fingers and mildly restless. Orientated to self and age but not to people or place or time. Moving all extremities. Pending EEG. Still has elevated LFTs but ammonia level normal.   OBJECTIVE Vitals:   05/09/20 1650 05/09/20 1951 05/09/20 2343 05/10/20 0544  BP: (!) 142/70 (!) 158/70 (!) 141/76 (!) 147/82  Pulse: 84 79 60 73  Resp: 20 17 16 18   Temp: 98 F (36.7 C) 98.6 F (37 C) 98.4 F (36.9 C) 98.7 F (37.1 C)  TempSrc: Oral Oral Oral Oral  SpO2: 100% 97% 100% 100%  Weight:        CBC:  Recent Labs  Lab 05/09/20 1148 05/09/20 1151  WBC 7.6  --   NEUTROABS 5.6  --   HGB 10.8* 11.6*  HCT 34.2* 34.0*  MCV 93.2  --   PLT 355  --     Basic Metabolic Panel:  Recent Labs  Lab 05/09/20 1148 05/09/20 1151  NA 138 140  K 3.9 3.9  CL 105 105  CO2 27  --   GLUCOSE 107* 102*  BUN 17 19  CREATININE 0.79 0.80  CALCIUM 9.0  --     Lipid Panel:     Component Value Date/Time   CHOL 227 (H) 05/09/2020 1148   TRIG 52 05/09/2020 1148   HDL 99 05/09/2020 1148   CHOLHDL 2.3 05/09/2020 1148   VLDL 10 05/09/2020 1148   LDLCALC 118 (H) 05/09/2020 1148   HgbA1c:  Lab Results  Component Value Date   HGBA1C 5.8 (H) 05/09/2020   Urine Drug Screen: No results found for: LABOPIA, COCAINSCRNUR, LABBENZ, AMPHETMU, THCU, LABBARB  Alcohol Level No results found for: [facility]  IMAGING  DG Pelvis 1-2 Views 05/09/2020 IMPRESSION:  No acute abnormality.   CT CERVICAL SPINE WO CONTRAST 05/09/2020 IMPRESSION:  No evidence of acute cervical spine fracture or subluxation. Stable degenerative spondylosis, as described above.   MR BRAIN WO CONTRAST MR ANGIO HEAD WO CONTRAST MR ANGIO NECK WO CONTRAST 05/09/2020 IMPRESSION:  1. No acute intracranial finding. Extensive chronic small-vessel ischemic change of the cerebral hemispheric white matter.  2. No  intracranial large or medium vessel occlusion or correctable proximal stenosis.  3. Atherosclerotic plaque at the distal ICA bulb region on the right with 30% stenosis. Left carotid bifurcation widely patent.   DG Chest Port 1 View 05/09/2020 IMPRESSION:  No acute disease.   CT HEAD CODE STROKE WO CONTRAST 05/09/2020 IMPRESSION:  1. No evidence of acute intracranial abnormality.  2. ASPECTS is 10.  3. Severe chronic small vessel ischemic disease.   Transthoracic Echocardiogram  00/00/2021 1. Left ventricular ejection fraction, by estimation, is 60 to 65%. The  left ventricle has normal function. The left ventricle has no regional  wall motion abnormalities. Left ventricular diastolic parameters are  consistent with Grade I diastolic  dysfunction (impaired relaxation).  2. Right ventricular systolic function is normal. The right ventricular  size is normal. There is normal pulmonary artery systolic pressure. The  estimated right ventricular systolic pressure is 06.2 mmHg.  3. Left atrial size was moderately dilated.  4. The mitral valve is normal in structure. No evidence of mitral valve  regurgitation. No evidence of mitral stenosis.  5. The aortic valve is tricuspid. There is moderate calcification of the  aortic valve. There is moderate thickening of the aortic valve. Aortic  valve  regurgitation is not visualized. Mild to moderate aortic valve  sclerosis/calcification is present,  without any evidence of aortic stenosis. Aortic valve mean gradient  measures 3.0 mmHg. Aortic valve Vmax measures 1.31 m/s.  6. The inferior vena cava is dilated in size with <50% respiratory  variability, suggesting right atrial pressure of 15 mmHg.   ECG - SB rate 59 BPM. (See cardiology reading for complete details)  EEG - This technically difficult study is within normal limits. No seizures or epileptiform discharges were seen throughout the recording.     PHYSICAL EXAM Blood pressure  (!) 147/82, pulse 73, temperature 98.7 F (37.1 C), temperature source Oral, resp. rate 18, weight 61.2 kg, SpO2 100 %.   Temp:  [97.8 F (36.6 C)-98.7 F (37.1 C)] 97.8 F (36.6 C) (04/30 1149) Pulse Rate:  [50-106] 73 (04/30 1149) Resp:  [13-20] 18 (04/30 1149) BP: (122-162)/(63-95) 134/63 (04/30 1149) SpO2:  [95 %-100 %] 100 % (04/30 1149)  General - Well nourished, well developed, mildly restless with finger fidgety.  Ophthalmologic - fundi not visualized due to noncooperation.  Cardiovascular - Regular rhythm and rate.  Neuro - awake, alert, eyes open, orientated to self, age, but not to place, time and people. No aphasia, but paucity of speech, following limited simple commands. Able to name 2/4 and repeat certain words but not 5-word sentences. No gaze palsy, tracking bilaterally, visual field full, PERRL. No facial droop. Bilateral UEs 3+/5, no drift. Bilaterally LEs 2/5 proximal but not cooperative with distal DF/PF checking. Sensation symmetrical bilaterally subjectively but not quite cooperative, b/l FTN not cooperative, gait not tested.     ASSESSMENT/PLAN [name] is a 87 y.o. male with history of vascular dementia, coronary artery disease s/p stenting, hypertension, hyperlipidemia, remote migraine, remote smoking history, bladder cancer, and multiple recent falls (hip replacement 11/2019 after falling) who presents to the ED as a Code Stroke after his wife noted Jesus Harmon was confused after sustaining a fall in the kitchen possibly striking his head. He did not receive IV t-PA due to non focal exam, unclear LKW, and possible head injury.  Encephalopathy from worsening cognitive impairment   CT Head - No evidence of acute intracranial abnormality.  MRI head - no acute finding  MRA H&N - No acute intracranial finding. Atherosclerotic plaque at the distal ICA bulb region on the right with 30% stenosis.   2D Echo EF 60 to 65%  EEG within normal limits  [employer] Virus 2 - negative  LDL - 118  HgbA1c - 5.8  UA negative  Ammonia level within normal range  Elevated LFT, but improving  VTE prophylaxis - Lovenox  No antithrombotic prior to admission, now on aspirin 81 mg daily  Ongoing aggressive stroke risk factor management  Therapy recommendations: SNF  Disposition:  Pending  Hypertension  Home BP meds: Lisinopril  Current BP meds: Lisinopril  Stable . Long-term BP goal normotensive  Hyperlipidemia  Home Lipid lowering medication: Zocor 20 mg daily  LDL 118, goal < 70  AST/ALT, improving, 152/322->83/151->57/123  Current lipid lowering medication: none due to elevated LFT  Resume statin once LFT normalized  Other Stroke Risk Factors  Advanced age  Former cigarette smoker - quit 60 years ago  Coronary artery disease status post stenting  Other Active Problems, Findings, Recommendations and/or Plan  Code status - DNR  Frequent falls at home  Vascular dementia  Bladder cancer   Hospital day # 0  I spent  35 minutes  in total face-to-face time with the patient, more than 50% of which was spent in counseling and coordination of care, reviewing test results, images and medication, and discussing the diagnosis, treatment plan and potential prognosis. This patient's care requiresreview of multiple databases, neurological assessment, discussion with family, other specialists and medical decision making of high complexity.    Rosalin Hawking, MD PhD Stroke Neurology 05/10/2020 9:16 PM     To contact Stroke Continuity provider, please refer to [email or web address] After hours, contact General Neurology

## 2020-05-10 NOTE — Evaluation (Signed)
Physical Therapy Evaluation Patient Details Name: Jesus Harmon MRN: [id number] DOB: [birth year] Today's Date: 05/10/2020   History of Present Illness  Pt is an 87 y.o. male admitted 05/09/20 AMS, multiple falls, slurred speech, PMH includes progressive vascular dementia, recent fall with sacral fracture (05/01/20), THR in November '21, h/ sinus bradycardia, HTN.  Clinical Impression  Patient required assistance and cuing for all mobility tasks. He was confused but he did follow simple commands with the help of his daughter. He was able to transfer out of the bed to a chair. He had been walking better at home prior to admission to memory care, where he had a fall. He now requires increased assistance. His daughter feels like it is more then his wife can handle at this time. He would benefit most from rehab at a SNF at this time.     Follow Up Recommendations SNF    Equipment Recommendations  None recommended by PT    Recommendations for Other Services Rehab consult     Precautions / Restrictions Precautions Precautions: Fall Restrictions Weight Bearing Restrictions: No      Mobility  Bed Mobility Overal bed mobility: Needs Assistance Bed Mobility: Supine to Sit     Supine to sit: Min assist;+2 for physical assistance;+2 for safety/equipment;HOB elevated     General bed mobility comments: min a to sit up at the edge of the bed for strength and cuing to sit up. Once sitting supervision for safety    Transfers Overall transfer level: Needs assistance Equipment used: Rolling Fincher (2 wheeled);2 person hand held assist Transfers: Sit to/from Stand Sit to Stand: Min assist;+2 physical assistance;+2 safety/equipment         General transfer comment: initial posterior lean, Pt corrected with forward momentum of mobility; continued min a required for safety with frequent cuing.  Ambulation/Gait Ambulation/Gait assistance: Min assist;+2 physical assistance;+2  safety/equipment Gait Distance (Feet): 10 Feet Assistive device: Rolling Duerr (2 wheeled) Gait Pattern/deviations: WFL(Within Functional Limits) Gait velocity: decreased Gait velocity interpretation: <1.31 ft/sec, indicative of household ambulator General Gait Details: ambualted to the sink and to the chair with max verbal and tactile cuing for safety and direction and min a for balance and safety.  Stairs            Wheelchair Mobility    Modified Rankin (Stroke Patients Only)       Balance Overall balance assessment: Needs assistance Sitting-balance support: Bilateral upper extremity supported;Feet supported Sitting balance-Leahy Scale: Fair   Postural control: Posterior lean (initially) Standing balance support: Bilateral upper extremity supported Standing balance-Leahy Scale: Poor Standing balance comment: requires BUE support                             Pertinent Vitals/Pain Pain Assessment: Faces Faces Pain Scale: Hurts a little bit Pain Location: generalized - sometimes makes a grunt Pain Descriptors / Indicators: Grimacing;Sore Pain Intervention(s): Monitored during session    Home Living Family/patient expects to be discharged to:: Private residence Living Arrangements: Children Available Help at Discharge: Family;Available 24 hours/day Type of Home: House Home Access: Stairs to enter Entrance Stairs-Rails: None Entrance Stairs-Number of Steps: 1 Home Layout: One level Home Equipment: Diedrich - 2 wheels;Cane - single point Additional Comments: Pt had recently been in a [facility] facility and had a bad experience- Pt with significant decline since then mentally and physically. Family is looking at other memory care facilities now. But most recently he has  been back in the daughters home.    Prior Function Level of Independence: Needs assistance   Gait / Transfers Assistance Needed: used RW at baseline, needs reminders  ADL's /  Homemaking Assistance Needed: assist for ADL - multimodal cues but will participate  Comments: wife and daughter help husband at home     Hand Dominance        Extremity/Trunk Assessment   Upper Extremity Assessment Upper Extremity Assessment: Defer to OT evaluation    Lower Extremity Assessment Lower Extremity Assessment: Generalized weakness    Cervical / Trunk Assessment Cervical / Trunk Assessment: Normal  Communication   Communication: No difficulties  Cognition Arousal/Alertness: Awake/alert Behavior During Therapy: WFL for tasks assessed/performed;Restless Overall Cognitive Status: History of cognitive impairments - at baseline Area of Impairment: Attention;Awareness                   Current Attention Level: Sustained       Awareness: Intellectual   General Comments: Daughter reports more restless than normal, disoriented (but that is normal for hospitalization), able to follow commands with cues      General Comments General comments (skin integrity, edema, etc.): Daughter present throughout treatment and helped cue patient    Exercises     Assessment/Plan    PT Assessment Patient needs continued PT services  PT Problem List Decreased strength;Decreased range of motion;Decreased activity tolerance;Decreased balance;Decreased mobility;Decreased knowledge of use of DME;Decreased safety awareness;Pain;Decreased cognition       PT Treatment Interventions DME instruction;Gait training;Functional mobility training;Therapeutic activities;Therapeutic exercise;Neuromuscular re-education;Patient/family education    PT Goals (Current goals can be found in the Care Plan section)  Acute Rehab PT Goals Patient Stated Goal: unable to state; daughters goal is safe mobility PT Goal Formulation: With patient/family Time For Goal Achievement: 05/17/20 Potential to Achieve Goals: Good    Frequency Min 2X/week   Barriers to discharge Decreased caregiver  support Patiets wife is 40 and daughters are not avialable all the time    Co-evaluation PT/OT/SLP Co-Evaluation/Treatment: Yes   PT goals addressed during session: Mobility/safety with mobility;Balance;Proper use of DME;Strengthening/ROM;Other (comment)         AM-PAC PT "6 Clicks" Mobility  Outcome Measure Help needed turning from your back to your side while in a flat bed without using bedrails?: A Lot Help needed moving from lying on your back to sitting on the side of a flat bed without using bedrails?: A Lot Help needed moving to and from a bed to a chair (including a wheelchair)?: A Lot Help needed standing up from a chair using your arms (e.g., wheelchair or bedside chair)?: A Lot Help needed to walk in hospital room?: A Lot Help needed climbing 3-5 steps with a railing? : Total 6 Click Score: 11    End of Session Equipment Utilized During Treatment: Gait belt Activity Tolerance: Other (comment) (patient was confused but followed commands) Patient left: in chair;with call bell/phone within reach;with family/visitor present (nurse was getting chair alarm and daughter present) Nurse Communication: Mobility status PT Visit Diagnosis: Other abnormalities of gait and mobility (R26.89);Muscle weakness (generalized) (M62.81);Difficulty in walking, not elsewhere classified (R26.2);Repeated falls (R29.6);Unsteadiness on feet (R26.81)    Time: 7124-5809 PT Time Calculation (min) (ACUTE ONLY): 29 min   Charges:   PT Evaluation $PT Eval Moderate Complexity: 1 Mod           Carney Living PT DPT  05/10/2020, 12:23 PM

## 2020-05-10 NOTE — Evaluation (Signed)
Occupational Therapy Evaluation Patient Details Name: Jesus Harmon MRN: [id number] DOB: January 12, 1930 Today's Date: 05/10/2020    History of Present Illness Pt is an 87 y.o. male admitted 05/09/20 AMS, multiple falls, slurred speech, PMH includes progressive vascular dementia, recent fall with sacral fracture (05/01/20), THR in November '21, h/ sinus bradycardia, HTN.   Clinical Impression   Daughter [name]) present throughout session and able to provide background history and status. Pt is currently at home with her (and her sister's help) after a bad experience at [facility] unit. Pt typically walks with RW and gets min A for ADL. Today Pt is cooperative, restless - able to ambulate in room with min A +2 and mod A +2 with RW. Pt engaged in sink level grooming with multimodal cues and mod A and was able to sit in recliner and eat some food. Pt will benefit from skilled OT in the acute setting as well as afterwards at the SNF level to maximize safety and independence in ADL and functional transfers. Family is actively looking for long term plan for Patient.     Follow Up Recommendations  SNF (or max HH services)    Equipment Recommendations  None recommended by OT (Pt has appropriate DME)    Recommendations for Other Services       Precautions / Restrictions Precautions Precautions: Fall Restrictions Weight Bearing Restrictions: No      Mobility Bed Mobility Overal bed mobility: Needs Assistance Bed Mobility: Supine to Sit     Supine to sit: Min assist;+2 for physical assistance;+2 for safety/equipment;HOB elevated     General bed mobility comments: assist to initiate action, and to bring trunk up and hips around.    Transfers Overall transfer level: Needs assistance Equipment used: Rolling [place] (2 wheeled);2 person hand held assist Transfers: Sit to/from Stand Sit to Stand: Min assist;+2 physical assistance;+2 safety/equipment         General transfer comment:  initial posterior lean, Pt corrected with forward momentum of mobility    Balance Overall balance assessment: Needs assistance Sitting-balance support: Bilateral upper extremity supported;Feet supported Sitting balance-Leahy Scale: Fair   Postural control: Posterior lean (initially) Standing balance support: Bilateral upper extremity supported Standing balance-Leahy Scale: Poor Standing balance comment: requires BUE support                           ADL either performed or assessed with clinical judgement   ADL Overall ADL's : Needs assistance/impaired Eating/Feeding: Set up;Sitting Eating/Feeding Details (indicate cue type and reason): able to eat biscuit sitting in chair Grooming: Wash/dry hands;Wash/dry face;Moderate assistance;Standing Grooming Details (indicate cue type and reason): multimodal cues for sequencing and task completion, assist for standing balance Upper Body Bathing: Moderate assistance   Lower Body Bathing: Maximal assistance   Upper Body Dressing : Moderate assistance   Lower Body Dressing: Maximal assistance   Toilet Transfer: +2 for physical assistance;+2 for safety/equipment;Minimal assistance Toilet Transfer Details (indicate cue type and reason): gait belt and HHA initially with +2, progressed to +1 with RW and required assist to navigate with RW - very far away from body Toileting- Clothing Manipulation and Hygiene: Maximal assistance       Functional mobility during ADLs: Minimal assistance;+2 for physical assistance;+2 for safety/equipment;Rolling [place];Cueing for safety;Cueing for sequencing (+1 mod A with RW, min A +2 with 2 person HHA)       Vision         Perception  Praxis      Pertinent Vitals/Pain Pain Assessment: Faces Faces Pain Scale: Hurts a little bit Pain Location: generalized - sometimes makes a grunt Pain Descriptors / Indicators: Grimacing;Sore Pain Intervention(s): Monitored during session;Repositioned      Hand Dominance     Extremity/Trunk Assessment Upper Extremity Assessment Upper Extremity Assessment: Overall WFL for tasks assessed   Lower Extremity Assessment Lower Extremity Assessment: Defer to PT evaluation       Communication Communication Communication: No difficulties   Cognition Arousal/Alertness: Awake/alert Behavior During Therapy: WFL for tasks assessed/performed;Restless Overall Cognitive Status: History of cognitive impairments - at baseline Area of Impairment: Attention;Awareness                   Current Attention Level: Sustained       Awareness: Intellectual   General Comments: Daughter reports more restless than normal, disoriented (but that is normal for hospitalization), able to follow commands with cues   General Comments  Daughter [name] (L&D RN) present throughout and sharing his background story and current status. He is a retired Chief Financial Officer and enjoys coffee and all things outdoors.    Exercises     Shoulder Instructions      Home Living Family/patient expects to be discharged to:: Private residence Living Arrangements: Children (Daughter: RN) Available Help at Discharge: Family;Available 24 hours/day Type of Home: House Home Access: Stairs to enter [employer] of Steps: 1 Entrance Stairs-Rails: None Home Layout: One level     Bathroom Shower/Tub: Occupational psychologist: Standard Bathroom Accessibility: Yes How Accessible: Accessible via [place] Home Equipment: [place] - 2 wheels;Cane - single point   Additional Comments: Pt had recently been in a [facility] facility and had a bad experience- Pt with significant decline since then mentally and physically. Family is looking at other memory care facilities now. But most recently he has been back in the daughters home.      Prior Functioning/Environment Level of Independence: Needs assistance  Gait / Transfers Assistance Needed: used RW at baseline,  needs reminders ADL's / Homemaking Assistance Needed: assist for ADL - multimodal cues but will participate            OT Problem List: Decreased strength;Decreased activity tolerance;Impaired balance (sitting and/or standing);Decreased cognition;Decreased safety awareness;Decreased knowledge of precautions      OT Treatment/Interventions: Self-care/ADL training;Therapeutic exercise;DME and/or AE instruction;Therapeutic activities;Patient/family education;Balance training    OT Goals(Current goals can be found in the care plan section) Acute Rehab OT Goals Patient Stated Goal: to maintain mobility, decrease falls, quality of life OT Goal Formulation: With family Time For Goal Achievement: 05/24/20 Potential to Achieve Goals: Fair ADL Goals Pt Will Perform Grooming: with supervision;standing Pt Will Perform Upper Body Dressing: with set-up;sitting Pt Will Perform Lower Body Dressing: with mod assist;sit to/from stand Pt Will Transfer to Toilet: with min guard assist;ambulating Pt Will Perform Toileting - Clothing Manipulation and hygiene: with min guard assist;sit to/from stand Additional ADL Goal #1: Pt will perform bed mobility at supervision level prior to engaging in ADL  OT Frequency: Min 2X/week   Barriers to D/C:    Family actively looking into alternative Memory Care Units       Co-evaluation              AM-PAC OT "6 Clicks" Daily Activity     Outcome Measure Help from another person eating meals?: A Little Help from another person taking care of personal grooming?: A Lot Help from another person  toileting, which includes using toliet, bedpan, or urinal?: A Lot Help from another person bathing (including washing, rinsing, drying)?: A Lot Help from another person to put on and taking off regular upper body clothing?: A Lot Help from another person to put on and taking off regular lower body clothing?: A Lot 6 Click Score: 13   End of Session Equipment Utilized  During Treatment: Gait belt;Rolling [place] Nurse Communication: Mobility status;Other (comment) (no chair alarm)  Activity Tolerance: Patient tolerated treatment well Patient left: in chair;with call bell/phone within reach;with family/visitor present (feet up in recliner - daughter and RN aware of no chair alarm)  OT Visit Diagnosis: Unsteadiness on feet (R26.81);Repeated falls (R29.6);Muscle weakness (generalized) (M62.81);History of falling (Z91.81);Other symptoms and signs involving cognitive function                Time: 3007-6226 OT Time Calculation (min): 32 min Charges:  OT General Charges $OT Visit: 1 Visit OT Evaluation $OT Eval Moderate Complexity: [place] OTR/L Acute Rehabilitation Services Pager: [phone] Office: [place] 05/10/2020, 11:51 AM

## 2020-05-10 NOTE — TOC Initial Note (Signed)
Transition of Care [facility]) - Initial/Assessment Note    Patient Details  Name: Jesus Harmon MRN: [id number] Date of Birth: 05/30/1929  Transition of Care [facility]) CM/SW Contact:    Oretha Milch, LCSW Phone Number: 05/10/2020, 1:12 PM  Clinical Narrative:                 CSW contacted by RN to meet with family. CSW reviewed chart and noted OT recommendation for SNF or "max HH." CSW met with family and patient which included patient's spouse and two daughters. CSW provided them with a list of Medicare ratings for SNF facilities. CSW reviewed what a SNF was and the services provided. CSW noted patient was at [facility] but was taken home due to fall concerns and is on a wait-list for [facility]. CSW reviewed list with family and discussed practicalities with [facility], [facility], [facility] and often difficulty or need to already be a residence for acceptance. CSW noted openness to referrals to 4 or 5 star facilities with geographical location not being as important. CSW noted alternatively if unable to get patient accepted to a high rated facility they would be open to home health and custodial services in addition. CSW discussed those services briefly with family and informed custodial care would be out of pocket and noted family verbalized understanding. CSW will begin SNF referrals and will follow-up with family with bed offers.   Expected Discharge Plan: Skilled Nursing Facility Barriers to Discharge: SNF Pending bed offer   Patient Goals and CMS Choice   CMS Medicare.gov Compare Post Acute Care list provided to:: Patient Represenative (must comment) Choice offered to / list presented to : Adult Children,Spouse  Expected Discharge Plan and Services Expected Discharge Plan: [place] arrangements for the past 2 months: [place]                                      Prior Living Arrangements/Services Living arrangements  for the past 2 months: [place] Lives with:: Facility Resident Patient language and need for interpreter reviewed:: Yes        Need for Family Participation in Patient Care: Yes (Comment) Care giver support system in place?: Yes (comment)   Criminal Activity/Legal Involvement Pertinent to Current Situation/Hospitalization: No - Comment as needed  Activities of Daily Living      Permission Sought/Granted Permission sought to share information with : Facility Art therapist granted to share information with : Yes, Verbal Permission Granted              Emotional Assessment Appearance:: Appears stated age Attitude/Demeanor/Rapport: Unable to Assess Affect (typically observed): Unable to Assess   Alcohol / Substance Use: Not Applicable Psych Involvement: No (comment)  Admission diagnosis:  Multiple falls [R29.6] Fall, initial encounter [W19.XXXA] Altered mental status, unspecified altered mental status type [R41.82] Patient Active Problem List   Diagnosis Date Noted  . Pressure injury of skin 05/10/2020  . Liver nodule 05/09/2020  . Falls frequently 05/09/2020  . Multiple falls 05/09/2020  . Dementia with behavioral disturbance ([place]) 11/24/2019  . Closed displaced fracture of left femoral neck ([place])   . B12 deficiency 11/23/2019  . Anemia 11/23/2019  . Pulmonary nodule, right 11/23/2019  . Hip fracture ([place]) 11/22/2019  . Pain due to onychomycosis of toenails of both feet 11/05/2019  . Vascular dementia  with behavior disturbance ([place]) 10/17/2019  . Dementia without behavioral disturbance ([place]) 05/13/2019  . Bilateral impacted cerumen 11/29/2018  . Presbycusis of both ears 11/29/2018  . Vasomotor rhinitis 11/29/2018  . S/P inguinal hernia repair 12/22/2016  . CAD (coronary artery disease) 06/16/2016  . Coronary artery disease involving native coronary artery of native heart with angina pectoris ([place]) 06/16/2016  . Benign hypertrophy  of prostate 03/04/2014  . Hyperlipidemia 02/17/2010  . Essential hypertension 02/17/2010  . CAD, NATIVE VESSEL  (06/16/2016): a. sev sten of mid right coronary art PTCA and DES , severe prox LAD stenosis unsucc PTCA, widely patent left main and left circ 11/14/2009  . FATIGUE 11/12/2009  . ARM NUMBNESS 11/12/2009  . DYSPNEA 11/12/2009   PCP:  Alroy Dust, L.Marlou Sa, MD Pharmacy:   [facility] [address] [place] [address] Phone: [phone] Fax: [phone]     Social Determinants of Health (SDOH) Interventions    Readmission Risk Interventions Readmission Risk Prevention Plan 11/25/2019  Medication Screening Complete  Transportation Screening Complete  Some recent data might be hidden

## 2020-05-11 DIAGNOSIS — Z515 Encounter for palliative care: Secondary | ICD-10-CM | POA: Diagnosis not present

## 2020-05-11 DIAGNOSIS — K5901 Slow transit constipation: Secondary | ICD-10-CM | POA: Diagnosis not present

## 2020-05-11 DIAGNOSIS — E7800 Pure hypercholesterolemia, unspecified: Secondary | ICD-10-CM | POA: Diagnosis not present

## 2020-05-11 DIAGNOSIS — R4182 Altered mental status, unspecified: Secondary | ICD-10-CM | POA: Diagnosis not present

## 2020-05-11 DIAGNOSIS — I10 Essential (primary) hypertension: Secondary | ICD-10-CM | POA: Diagnosis not present

## 2020-05-11 DIAGNOSIS — Z66 Do not resuscitate: Secondary | ICD-10-CM | POA: Diagnosis not present

## 2020-05-11 DIAGNOSIS — R52 Pain, unspecified: Secondary | ICD-10-CM

## 2020-05-11 DIAGNOSIS — R410 Disorientation, unspecified: Secondary | ICD-10-CM

## 2020-05-11 DIAGNOSIS — F5103 Paradoxical insomnia: Secondary | ICD-10-CM

## 2020-05-11 MED ORDER — BISACODYL 10 MG RE SUPP
10.0000 mg | Freq: Once | RECTAL | Status: AC
  Administered 2020-05-11: 10 mg via RECTAL
  Filled 2020-05-11: qty 1

## 2020-05-11 MED ORDER — OLANZAPINE 5 MG PO TBDP
5.0000 mg | ORAL_TABLET | Freq: Every day | ORAL | Status: DC
  Administered 2020-05-11: 5 mg via ORAL
  Filled 2020-05-11: qty 1

## 2020-05-11 MED ORDER — ACETAMINOPHEN 500 MG PO TABS
1000.0000 mg | ORAL_TABLET | Freq: Two times a day (BID) | ORAL | Status: DC
  Administered 2020-05-11 (×2): 1000 mg via ORAL
  Filled 2020-05-11 (×2): qty 2

## 2020-05-11 NOTE — Progress Notes (Signed)
Palliative Medicine Inpatient Follow Up Note   HPI:  87 y.o. male  with past medical history of vascular dementia, CAD, diastolic heart failure, HTN, HLD, recent fall with sacral fracture 05/01/20, fall with left hip fracture s/p total hip 11/23/19, liver nodule (undergoing work up with Dr. Watt Climes), kidney stones, bladder cancer admitted on 05/09/2020 with multiple falls and altered mental status. MRI shows no evidence of stroke but advanced vascular disease.  Today's Discussion (05/11/2020):  *Please note that this is a verbal dictation therefore any spelling or grammatical errors are due to the "[place]" system interpretation.  Chart reviewed.  Yesterday patient had been changed to olanzapine from trazodone.  It appears overnight he required a dose of Haldol 2 mg intramuscularly he had also been started by the primary team on melatonin nightly.  Given patient's restlessness and his inability to identify whether or not he is in pain Tylenol was increased to twice daily dosing.  Patient has also not had a bowel movement since admission therefore have ordered a bisacodyl suppository x1 as constipation can contribute greatly to delirium.  We will need to institute very strict delirium precautions and ideally get patient up during the day though this will be complicated by the fact that he is restless and does not have a sitter at bedside.  Spoke to patient's bedside RN who shared with me that patient was more restful this morning though per her report overnight was relatively restless.  Discussed the importance of continued conversation with family and their  medical providers regarding overall plan of care and treatment options, ensuring decisions are within the context of the patients values and GOCs.  Questions and concerns addressed   Objective Assessment: Vital Signs Vitals:   05/10/20 2300 05/11/20 0510  BP: 135/69 140/82  Pulse: 83 68  Resp: 18 18  Temp: 99.1 F (37.3 C) 98.4  F (36.9 C)  SpO2: 97% 98%    Intake/Output Summary (Last 24 hours) at 05/11/2020 0909 Last data filed at 05/11/2020 0514 Gross per 24 hour  Intake --  Output 800 ml  Net -800 ml   Last Weight  Most recent update: 05/09/2020 11:49 AM   Weight  61.2 kg (134 lb 14.7 oz)           Physical Exam Vitals and nursing note reviewed.  Constitutional:      General: He is not in acute distress.    Appearance: He is chronicallt ill-appearing.     Comments: Temporal muscle wasting  Cardiovascular:     Rate and Rhythm: Normal rate.  Pulmonary:     Effort: Pulmonary effort is normal.  Abdominal:     General: Abdomen is flat.  Neurological:     Mental Status: He is alert. He is disoriented.   SUMMARY OF RECOMMENDATIONS   - DNAR/DNI - Care at home becoming difficult and family continue to search for long term care option that is a good fit for him - Interested in short term rehab  Symptom Management:  Delirium: -Lights and TV off, minimize interruptions at night -Blinds open and lights on during day -Glasses/hearing aid with patient -Get up during the day -Encourage a familiar face to remain present throughout the day -Frequent reorientation  Generalized Pain:  -Tylenol 1000mg  BID  Insomnia: - Will increase Zyprexa to 5mg  qhs  - On melatonin 3mg  PO QHS - Had received a dose of haldol at 1910 last night  Constipation: - Bisacodyl 10mg  PR x1  Time Spent:  25 Greater than 50% of the time was spent in counseling and coordination of care ______________________________________________________________________________________ [place] Team Team Cell Phone: [phone] Please utilize secure chat with additional questions, if there is no response within 30 minutes please call the above phone number  Palliative Medicine Team providers are available by phone from 7am to 7pm daily and can be reached through the team cell phone.  Should this  patient require assistance outside of these hours, please call the patient's attending physician.

## 2020-05-11 NOTE — Progress Notes (Signed)
PROGRESS NOTE    TANISHA EDELMAN  [id number] DOB: 10-08-1929 DOA: 05/09/2020 PCP: Alroy Dust, L.Marlou Sa, MD    Brief Narrative:  87 year old gentleman with history of progressive vascular dementia, recent fall and sacral fracture, total hip replacement in 2021, sinus bradycardia, hypertension and currently with increasing difficulty to manage symptoms at home brought from home with multiple falls, also found to have slurred speech, confusion and not recognizing his wife.  EMS activated code stroke and patient was brought to ER. In the emergency room hemodynamically stable.  Code stroke initiated secondary to fall and altered mental status.  CT head negative.  CT neck negative.  Skeletal survey negative.  Admitted due to neurological changes mostly related to altered mental status.   Assessment & Plan:   Active Problems:   Hyperlipidemia   Essential hypertension   Vascular dementia with behavior disturbance (HCC)   Liver nodule   Falls frequently   Multiple falls   Pressure injury of skin  Multiple falls/progressive debility and dementia/acute encephalopathy in the setting of underlying advanced vascular dementia: MRI of the brain with severe chronic microvascular disease.  MRA of the head and neck was essentially normal.  No evidence of a stroke.  TIA and stroke ruled out. Urinalysis normal.  No evidence of acute infection.   EEG was done, less likely a seizure.  No indication for treatment. Patient with progressive dementia, his symptoms are likely related to advanced Alzheimer's dementia. He was seen by neurology in the office, could not tolerate therapeutics.  Plan: Work with PT OT to determine level of mobility.  Started Zyprexa at night, increasing dose today.  Also on melatonin. Delirium precaution protocol.  Unrestricted visitor, unrestricted visiting hours due to altered mental status and help nighttime symptoms with family present. Palliative care consultation to discuss  palliation/may qualify for home hospice if going home. May need placement, memory care unit.  Social worker talking with family.  Hypertension: Stable.  Home medications.  Hyperlipidemia: Stable.  Home medication.  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  MRI was not diagnostic.  Does not look like metastatic cancer, however possible.Given patient's advanced frailty and deteriorating mental status, it is wise to prevent sedation and anesthesia.  I discussed this with patient's family that knowing the diagnosis will not help any treatment as he is not able to tolerate any treatment/chemotherapy or surgery at this stage of life.  If in case his mobility and mental status improves, he may benefit with diagnostic biopsy.  Family agreed.  Goal of care: Patient with advanced dementia and probably terminal stage.  Now with progressive symptoms.  Called and updated patient's daughter Ms. [name] on the phone and went over his diagnosis and rapidly worsening symptoms. Family agreed to try for rehab options.  If he does not do well with rehab, home with hospice is appropriate option for him. Appreciate palliative team involvement.   DVT prophylaxis: enoxaparin (LOVENOX) injection 40 mg Start: 05/09/20 1800   Code Status: DNR Family Communication: Daughter [name] on the phone. Disposition Plan: Status is: Observation  The patient will require care spanning > 2 midnights and should be moved to inpatient because: Unsafe d/c plan  Dispo: The patient is from: Home              Anticipated d/c is to: SNF              Patient currently is not medically  stable to d/c.   Difficult to place patient No   Consultants:   Palliative medicine  Procedures:   None  Antimicrobials:   None   Subjective: Patient seen and examined.  Overnight was restless.  Given 1 dose of Haldol.  Early morning he was  sleepy, he is fidgety on interview.  Does not recognize this provider.  Confused.  Objective: Vitals:   05/10/20 1149 05/10/20 1644 05/10/20 2300 05/11/20 0510  BP: 134/63 (!) 143/62 135/69 140/82  Pulse: 73 61 83 68  Resp: 18 18 18 18   Temp: 97.8 F (36.6 C) 97.7 F (36.5 C) 99.1 F (37.3 C) 98.4 F (36.9 C)  TempSrc: Oral Oral Axillary Axillary  SpO2: 100% 98% 97% 98%  Weight:        Intake/Output Summary (Last 24 hours) at 05/11/2020 1059 Last data filed at 05/11/2020 0900 Gross per 24 hour  Intake 112 ml  Output 800 ml  Net -688 ml   Filed Weights   05/09/20 1100  Weight: 61.2 kg    Examination:  General: Sick looking.  Not in any acute distress.  Confused, fidgety.  Alert on stimulation but oriented x0.  Follows simple commands but not consistently. Cardiovascular: S1-S2 normal.  No added sounds. Respiratory: Bilateral clear.  Some conducted upper airway sounds. Gastrointestinal: Soft and nontender.  Bowel sounds present. Ext: No swelling.  No edema or cyanosis. Neuro: No focal neurological deficits.    Data Reviewed: I have personally reviewed following labs and imaging studies  CBC: Recent Labs  Lab 05/09/20 1148 05/09/20 1151  WBC 7.6  --   NEUTROABS 5.6  --   HGB 10.8* 11.6*  HCT 34.2* 34.0*  MCV 93.2  --   PLT 355  --    Basic Metabolic Panel: Recent Labs  Lab 05/09/20 1148 05/09/20 1151  NA 138 140  K 3.9 3.9  CL 105 105  CO2 27  --   GLUCOSE 107* 102*  BUN 17 19  CREATININE 0.79 0.80  CALCIUM 9.0  --    GFR: Estimated Creatinine Clearance: 55.3 mL/min (by C-G formula based on SCr of 0.8 mg/dL). Liver Function Tests: Recent Labs  Lab 05/09/20 1148 05/10/20 0415  AST 83* 57*  ALT 151* 123*  ALKPHOS 543* 503*  BILITOT 0.9 1.2  PROT 6.2* 5.7*  ALBUMIN 3.1* 2.8*   No results for input(s): LIPASE, AMYLASE in the last 168 hours. Recent Labs  Lab 05/10/20 0415  AMMONIA 27   Coagulation Profile: Recent Labs  Lab 05/09/20 1148   INR 0.9   Cardiac Enzymes: No results for input(s): CKTOTAL, CKMB, CKMBINDEX, TROPONINI in the last 168 hours. BNP (last 3 results) No results for input(s): PROBNP in the last 8760 hours. HbA1C: Recent Labs    05/09/20 1148  HGBA1C 5.8*   CBG: Recent Labs  Lab 05/09/20 1146  GLUCAP 93   Lipid Profile: Recent Labs    05/09/20 1148  CHOL 227*  HDL 99  LDLCALC 118*  TRIG 52  CHOLHDL 2.3   Thyroid Function Tests: No results for input(s): TSH, T4TOTAL, FREET4, T3FREE, THYROIDAB in the last 72 hours. Anemia Panel: No results for input(s): VITAMINB12, FOLATE, FERRITIN, TIBC, IRON, RETICCTPCT in the last 72 hours. Sepsis Labs: No results for input(s): PROCALCITON, LATICACIDVEN in the last 168 hours.  Recent Results (from the past 240 hour(s))  Resp Panel by RT-PCR (Flu A&B, Covid) Nasopharyngeal Swab     Status: None   Collection Time: 05/09/20 11:49 AM  Specimen: Nasopharyngeal Swab; Nasopharyngeal(NP) swabs in vial transport medium  Result Value Ref Range Status   SARS Coronavirus 2 by RT PCR NEGATIVE NEGATIVE Final    Comment: (NOTE) SARS-CoV-2 target nucleic acids are NOT DETECTED.  The SARS-CoV-2 RNA is generally detectable in upper respiratory specimens during the acute phase of infection. The lowest concentration of SARS-CoV-2 viral copies this assay can detect is 138 copies/mL. A negative result does not preclude SARS-Cov-2 infection and should not be used as the sole basis for treatment or other patient management decisions. A negative result may occur with  improper specimen collection/handling, submission of specimen other than nasopharyngeal swab, presence of viral mutation(s) within the areas targeted by this assay, and inadequate number of viral copies(<138 copies/mL). A negative result must be combined with clinical observations, patient history, and epidemiological information. The expected result is Negative.  Fact Sheet for Patients:   EntrepreneurPulse.com.au  Fact Sheet for Healthcare Providers:  IncredibleEmployment.be  This test is no t yet approved or cleared by the [place] FDA and  has been authorized for detection and/or diagnosis of SARS-CoV-2 by FDA under an Emergency Use Authorization (EUA). This EUA will remain  in effect (meaning this test can be used) for the duration of the COVID-19 declaration under Section 564(b)(1) of the Act, 21 U.S.C.section 360bbb-3(b)(1), unless the authorization is terminated  or revoked sooner.       Influenza A by PCR NEGATIVE NEGATIVE Final   Influenza B by PCR NEGATIVE NEGATIVE Final    Comment: (NOTE) The Xpert Xpress SARS-CoV-2/FLU/RSV plus assay is intended as an aid in the diagnosis of influenza from Nasopharyngeal swab specimens and should not be used as a sole basis for treatment. Nasal washings and aspirates are unacceptable for Xpert Xpress SARS-CoV-2/FLU/RSV testing.  Fact Sheet for Patients: EntrepreneurPulse.com.au  Fact Sheet for Healthcare Providers: IncredibleEmployment.be  This test is not yet approved or cleared by the [place] FDA and has been authorized for detection and/or diagnosis of SARS-CoV-2 by FDA under an Emergency Use Authorization (EUA). This EUA will remain in effect (meaning this test can be used) for the duration of the COVID-19 declaration under Section 564(b)(1) of the Act, 21 U.S.C. section 360bbb-3(b)(1), unless the authorization is terminated or revoked.  Performed at [facility], [place] [address] [place], [place] [address]          Radiology Studies: DG Pelvis 1-2 Views  Result Date: 05/09/2020 CLINICAL DATA:  The patient suffered multiple falls yesterday. Initial encounter. EXAM: PELVIS - 1-2 VIEW COMPARISON:  Single-view of the pelvis 11/23/2019. FINDINGS: There is no evidence of pelvic fracture or diastasis. No pelvic bone  lesions are seen. Left hip replacement is in place. The patient has mild to moderate appearing right hip osteoarthritis. IMPRESSION: No acute abnormality. Electronically Signed   By: Inge Rise M.D.   On: 05/09/2020 12:55   CT CERVICAL SPINE WO CONTRAST  Result Date: 05/09/2020 CLINICAL DATA:  Fall this morning.  Neck pain.  Initial encounter. EXAM: CT CERVICAL SPINE WITHOUT CONTRAST TECHNIQUE: Multidetector CT imaging of the cervical spine was performed without intravenous contrast. Multiplanar CT image reconstructions were also generated. COMPARISON:  11/22/2019 FINDINGS: Alignment: Normal. Skull base and vertebrae: No acute fracture. No primary bone lesion or focal pathologic process. Soft tissues and spinal canal: No prevertebral fluid or swelling. No visible canal hematoma. Disc levels: Moderate to severe degenerative disc disease is again seen from levels of C4-T2, which is most severe at C5-6. Moderate left-sided facet  DJD is again seen at C2-3 and C3-4. these findings show no significant change compared to prior exam. Upper chest: No acute findings. Other: None. IMPRESSION: No evidence of acute cervical spine fracture or subluxation. Stable degenerative spondylosis, as described above. Electronically Signed   By: Marlaine Hind M.D.   On: 05/09/2020 12:26   MR ANGIO HEAD WO CONTRAST  Result Date: 05/09/2020 CLINICAL DATA:  Code stroke presentation.  Altered mental status. EXAM: MRI HEAD WITHOUT CONTRAST MRA HEAD WITHOUT CONTRAST MRA NECK WITHOUT CONTRAST TECHNIQUE: Multiplanar, multiecho pulse sequences of the brain and surrounding structures were obtained without intravenous contrast. Angiographic images of the Circle of Willis were obtained using MRA technique without intravenous contrast. Angiographic images of the neck were obtained using MRA technique without intravenous contrast. Carotid stenosis measurements (when applicable) are obtained utilizing NASCET criteria, using the distal  internal carotid diameter as the denominator. COMPARISON:  CT study same day FINDINGS: MRI HEAD FINDINGS Brain: Diffusion imaging does not show any acute or subacute infarction. The brainstem and cerebellum are normal. Cerebral hemispheres show age related volume loss with extensive chronic small-vessel ischemic change within the deep and subcortical white matter. No cortical or large vessel territory infarction. No mass lesion, hemorrhage, hydrocephalus or extra-axial collection. Vascular: Major vessels at the base of the brain show flow. Skull and upper cervical spine: Negative Sinuses/Orbits: Clear/normal Other: None MRA HEAD FINDINGS Both internal carotid arteries are widely patent into the brain. No siphon stenosis. The anterior and middle cerebral vessels are patent without proximal stenosis, aneurysm or vascular malformation. Both vertebral arteries are widely patent to the basilar. No basilar stenosis. Posterior circulation branch vessels appear normal. MRA NECK FINDINGS Both common carotid arteries are patent to the bifurcation region. Carotid bifurcation and cervical ICA on the left are normal. On the right, there is some atherosclerotic plaque at the distal bulb region with stenosis of 30%. Cervical ICA widely patent beyond that. Both vertebral artery origins are patent. Both vertebral arteries appear patent and normal through the cervical region with antegrade flow. IMPRESSION: 1. No acute intracranial finding. Extensive chronic small-vessel ischemic change of the cerebral hemispheric white matter. 2. No intracranial large or medium vessel occlusion or correctable proximal stenosis. 3. Atherosclerotic plaque at the distal ICA bulb region on the right with 30% stenosis. Left carotid bifurcation widely patent. Electronically Signed   By: Nelson Chimes M.D.   On: 05/09/2020 16:55   MR ANGIO NECK WO CONTRAST  Result Date: 05/09/2020 CLINICAL DATA:  Code stroke presentation.  Altered mental status. EXAM:  MRI HEAD WITHOUT CONTRAST MRA HEAD WITHOUT CONTRAST MRA NECK WITHOUT CONTRAST TECHNIQUE: Multiplanar, multiecho pulse sequences of the brain and surrounding structures were obtained without intravenous contrast. Angiographic images of the Circle of Willis were obtained using MRA technique without intravenous contrast. Angiographic images of the neck were obtained using MRA technique without intravenous contrast. Carotid stenosis measurements (when applicable) are obtained utilizing NASCET criteria, using the distal internal carotid diameter as the denominator. COMPARISON:  CT study same day FINDINGS: MRI HEAD FINDINGS Brain: Diffusion imaging does not show any acute or subacute infarction. The brainstem and cerebellum are normal. Cerebral hemispheres show age related volume loss with extensive chronic small-vessel ischemic change within the deep and subcortical white matter. No cortical or large vessel territory infarction. No mass lesion, hemorrhage, hydrocephalus or extra-axial collection. Vascular: Major vessels at the base of the brain show flow. Skull and upper cervical spine: Negative Sinuses/Orbits: Clear/normal Other: None MRA HEAD FINDINGS Both internal  carotid arteries are widely patent into the brain. No siphon stenosis. The anterior and middle cerebral vessels are patent without proximal stenosis, aneurysm or vascular malformation. Both vertebral arteries are widely patent to the basilar. No basilar stenosis. Posterior circulation branch vessels appear normal. MRA NECK FINDINGS Both common carotid arteries are patent to the bifurcation region. Carotid bifurcation and cervical ICA on the left are normal. On the right, there is some atherosclerotic plaque at the distal bulb region with stenosis of 30%. Cervical ICA widely patent beyond that. Both vertebral artery origins are patent. Both vertebral arteries appear patent and normal through the cervical region with antegrade flow. IMPRESSION: 1. No acute  intracranial finding. Extensive chronic small-vessel ischemic change of the cerebral hemispheric white matter. 2. No intracranial large or medium vessel occlusion or correctable proximal stenosis. 3. Atherosclerotic plaque at the distal ICA bulb region on the right with 30% stenosis. Left carotid bifurcation widely patent. Electronically Signed   By: Nelson Chimes M.D.   On: 05/09/2020 16:55   MR BRAIN WO CONTRAST  Result Date: 05/09/2020 CLINICAL DATA:  Code stroke presentation.  Altered mental status. EXAM: MRI HEAD WITHOUT CONTRAST MRA HEAD WITHOUT CONTRAST MRA NECK WITHOUT CONTRAST TECHNIQUE: Multiplanar, multiecho pulse sequences of the brain and surrounding structures were obtained without intravenous contrast. Angiographic images of the Circle of Willis were obtained using MRA technique without intravenous contrast. Angiographic images of the neck were obtained using MRA technique without intravenous contrast. Carotid stenosis measurements (when applicable) are obtained utilizing NASCET criteria, using the distal internal carotid diameter as the denominator. COMPARISON:  CT study same day FINDINGS: MRI HEAD FINDINGS Brain: Diffusion imaging does not show any acute or subacute infarction. The brainstem and cerebellum are normal. Cerebral hemispheres show age related volume loss with extensive chronic small-vessel ischemic change within the deep and subcortical white matter. No cortical or large vessel territory infarction. No mass lesion, hemorrhage, hydrocephalus or extra-axial collection. Vascular: Major vessels at the base of the brain show flow. Skull and upper cervical spine: Negative Sinuses/Orbits: Clear/normal Other: None MRA HEAD FINDINGS Both internal carotid arteries are widely patent into the brain. No siphon stenosis. The anterior and middle cerebral vessels are patent without proximal stenosis, aneurysm or vascular malformation. Both vertebral arteries are widely patent to the basilar. No  basilar stenosis. Posterior circulation branch vessels appear normal. MRA NECK FINDINGS Both common carotid arteries are patent to the bifurcation region. Carotid bifurcation and cervical ICA on the left are normal. On the right, there is some atherosclerotic plaque at the distal bulb region with stenosis of 30%. Cervical ICA widely patent beyond that. Both vertebral artery origins are patent. Both vertebral arteries appear patent and normal through the cervical region with antegrade flow. IMPRESSION: 1. No acute intracranial finding. Extensive chronic small-vessel ischemic change of the cerebral hemispheric white matter. 2. No intracranial large or medium vessel occlusion or correctable proximal stenosis. 3. Atherosclerotic plaque at the distal ICA bulb region on the right with 30% stenosis. Left carotid bifurcation widely patent. Electronically Signed   By: Nelson Chimes M.D.   On: 05/09/2020 16:55   DG Chest Port 1 View  Result Date: 05/09/2020 CLINICAL DATA:  Confusion. The patient has suffered multiple falls yesterday evening. EXAM: PORTABLE CHEST 1 VIEW COMPARISON:  Single-view of the chest 11/25/2019. CT chest 11/23/2019. FINDINGS: The lungs are clear. Heart size is normal. No pneumothorax or pleural fluid. No acute or focal bony abnormality. IMPRESSION: No acute disease. Electronically Signed   By: Marcello Moores  Dalessio M.D.   On: 05/09/2020 12:53   EEG adult  Result Date: 05/10/2020 Lora Havens, MD     05/10/2020  7:35 PM Patient Name: Jesus Harmon MRN: [id number] Epilepsy Attending: Lora Havens Referring Physician/Provider: Dr Rosalin Hawking Date: 05/10/2020 Duration: 26.29 mins Patient history: 87 year old male with vascular dementia, frequent falls, and longstanding history of balance problems who presented today as a Code Stroke for evaluation of confusion and aphasia after sustaining a fall (possibly hitting head). EEG to evaluate for seizure. Level of alertness: Awake AEDs during EEG study:  None Technical aspects: This EEG study was done with scalp electrodes positioned according to the 10-20 International system of electrode placement. Electrical activity was acquired at a sampling rate of 500Hz  and reviewed with a high frequency filter of 70Hz  and a low frequency filter of 1Hz . EEG data were recorded continuously and digitally stored. Description: The posterior dominant rhythm consists of 8-9 Hz activity of moderate voltage (25-35 uV) seen predominantly in posterior head regions, symmetric and reactive to eye opening and eye closing. Drowsiness was characterized by attenuation of the posterior background rhythm. Hyperventilation and photic stimulation were not performed.   Of note, study was technically difficult due to significant electrode artifact as patient was confused and pulling electrodes. IMPRESSION: This technically difficult study is within normal limits. No seizures or epileptiform discharges were seen throughout the recording. If concern for ictal-interictal activity persists, can consider prolonged monitoring Lora Havens   ECHOCARDIOGRAM COMPLETE  Result Date: 05/10/2020    ECHOCARDIOGRAM REPORT   Patient Name:   ARJEN DERINGER Date of Exam: 05/10/2020 Medical Rec #:  [id number]       Height:       71.0 in Accession #:    [id number]      Weight:       134.9 lb Date of Birth:  [birth year]       BSA:          1.784 m Patient Age:    88 years        BP:           134/63 mmHg Patient Gender: M               HR:           59 bpm. Exam Location:  Inpatient Procedure: 2D Echo, Cardiac Doppler and Color Doppler Indications:    Stroke  History:        Patient has prior history of Echocardiogram examinations, most                 recent 02/24/2018. Risk Factors:Hypertension and Dyslipidemia.                 Bradycardia.  Sonographer:    Clayton Lefort RDCS (AE) Referring Phys: [id number] [place]  1. Left ventricular ejection fraction, by estimation, is 60 to 65%. The left  ventricle has normal function. The left ventricle has no regional wall motion abnormalities. Left ventricular diastolic parameters are consistent with Grade I diastolic dysfunction (impaired relaxation).  2. Right ventricular systolic function is normal. The right ventricular size is normal. There is normal pulmonary artery systolic pressure. The estimated right ventricular systolic pressure is 36.6 mmHg.  3. Left atrial size was moderately dilated.  4. The mitral valve is normal in structure. No evidence of mitral valve regurgitation. No evidence of mitral stenosis.  5. The aortic valve is tricuspid. There is moderate calcification of the aortic  valve. There is moderate thickening of the aortic valve. Aortic valve regurgitation is not visualized. Mild to moderate aortic valve sclerosis/calcification is present, without any evidence of aortic stenosis. Aortic valve mean gradient measures 3.0 mmHg. Aortic valve Vmax measures 1.31 m/s.  6. The inferior vena cava is dilated in size with <50% respiratory variability, suggesting right atrial pressure of 15 mmHg. Conclusion(s)/Recommendation(s): No intracardiac source of embolism detected on this transthoracic study. A transesophageal echocardiogram is recommended to exclude cardiac source of embolism if clinically indicated. FINDINGS  Left Ventricle: Left ventricular ejection fraction, by estimation, is 60 to 65%. The left ventricle has normal function. The left ventricle has no regional wall motion abnormalities. The left ventricular internal cavity size was normal in size. There is  no left ventricular hypertrophy. Left ventricular diastolic parameters are consistent with Grade I diastolic dysfunction (impaired relaxation). Right Ventricle: The right ventricular size is normal. No increase in right ventricular wall thickness. Right ventricular systolic function is normal. There is normal pulmonary artery systolic pressure. The tricuspid regurgitant velocity is 1.64  m/s, and  with an assumed right atrial pressure of 15 mmHg, the estimated right ventricular systolic pressure is 0000000 mmHg. Left Atrium: Left atrial size was moderately dilated. Right Atrium: Right atrial size was normal in size. Pericardium: There is no evidence of pericardial effusion. Mitral Valve: The mitral valve is normal in structure. No evidence of mitral valve regurgitation. No evidence of mitral valve stenosis. MV peak gradient, 2.4 mmHg. The mean mitral valve gradient is 1.0 mmHg. Tricuspid Valve: The tricuspid valve is normal in structure. Tricuspid valve regurgitation is trivial. No evidence of tricuspid stenosis. Aortic Valve: The aortic valve is tricuspid. There is moderate calcification of the aortic valve. There is moderate thickening of the aortic valve. Aortic valve regurgitation is not visualized. Mild to moderate aortic valve sclerosis/calcification is present, without any evidence of aortic stenosis. Aortic valve mean gradient measures 3.0 mmHg. Aortic valve peak gradient measures 6.9 mmHg. Aortic valve area, by VTI measures 2.97 cm. Pulmonic Valve: The pulmonic valve was normal in structure. Pulmonic valve regurgitation is trivial. No evidence of pulmonic stenosis. Aorta: The aortic root is normal in size and structure. Venous: The inferior vena cava is dilated in size with less than 50% respiratory variability, suggesting right atrial pressure of 15 mmHg. IAS/Shunts: No atrial level shunt detected by color flow Doppler.  LEFT VENTRICLE PLAX 2D LVIDd:         4.70 cm  Diastology LVIDs:         2.80 cm  LV e' medial:    8.59 cm/s LV PW:         1.00 cm  LV E/e' medial:  8.9 LV IVS:        0.90 cm  LV e' lateral:   12.10 cm/s LVOT diam:     2.20 cm  LV E/e' lateral: 6.3 LV SV:         81 LV SV Index:   46 LVOT Area:     3.80 cm  RIGHT VENTRICLE             IVC RV Basal diam:  3.80 cm     IVC diam: 2.20 cm RV Mid diam:    2.60 cm RV S prime:     14.60 cm/s TAPSE (M-mode): 2.5 cm LEFT ATRIUM              Index       RIGHT ATRIUM  Index LA diam:        2.60 cm 1.46 cm/m  RA Area:     19.80 cm LA Vol (A2C):   47.2 ml 26.46 ml/m RA Volume:   58.70 ml  32.91 ml/m LA Vol (A4C):   41.4 ml 23.21 ml/m LA Biplane Vol: 48.1 ml 26.97 ml/m  AORTIC VALVE AV Area (Vmax):    3.28 cm AV Area (Vmean):   3.15 cm AV Area (VTI):     2.97 cm AV Vmax:           131.00 cm/s AV Vmean:          86.800 cm/s AV VTI:            0.274 m AV Peak Grad:      6.9 mmHg AV Mean Grad:      3.0 mmHg LVOT Vmax:         113.00 cm/s LVOT Vmean:        71.900 cm/s LVOT VTI:          0.214 m LVOT/AV VTI ratio: 0.78  AORTA Ao Root diam: 3.30 cm Ao Asc diam:  3.40 cm MITRAL VALVE               TRICUSPID VALVE MV Area (PHT): 2.87 cm    TR Peak grad:   10.8 mmHg MV Area VTI:   2.99 cm    TR Vmax:        164.00 cm/s MV Peak grad:  2.4 mmHg MV Mean grad:  1.0 mmHg    SHUNTS MV Vmax:       0.77 m/s    Systemic VTI:  0.21 m MV Vmean:      46.9 cm/s   Systemic Diam: 2.20 cm MV Decel Time: 264 msec MV E velocity: 76.30 cm/s MV A velocity: 57.80 cm/s MV E/A ratio:  1.32 Candee Furbish MD Electronically signed by Candee Furbish MD Signature Date/Time: 05/10/2020/2:38:10 PM    Final    CT HEAD CODE STROKE WO CONTRAST  Result Date: 05/09/2020 CLINICAL DATA:  Code stroke.  Altered mental status. EXAM: CT HEAD WITHOUT CONTRAST TECHNIQUE: Contiguous axial images were obtained from the base of the skull through the vertex without intravenous contrast. COMPARISON:  05/01/2020 FINDINGS: Brain: There is no evidence of an acute infarct, intracranial hemorrhage, mass, midline shift, or extra-axial fluid collection. A tiny chronic cortical/subcortical infarct is again seen in the right superior frontal gyrus. Confluent hypodensities in the cerebral white matter bilaterally are unchanged and nonspecific but compatible with severe chronic small vessel ischemic disease. There is mild-to-moderate cerebral atrophy. Vascular: Calcified atherosclerosis at the  skull base. No hyperdense vessel. Skull: No fracture or suspicious osseous lesion. Sinuses/Orbits: Partially visualized mild mucosal thickening in the left maxillary sinus. Clear mastoid air cells. Bilateral cataract extraction. Other: Mild soft tissue swelling/small hematoma is involving the right greater than left parietal scalp. ASPECTS [facility] Stroke Program Early CT Score) - Ganglionic level infarction (caudate, lentiform nuclei, internal capsule, insula, M1-M3 cortex): 7 - Supraganglionic infarction (M4-M6 cortex): 3 Total score (0-10 with 10 being normal): 10 IMPRESSION: 1. No evidence of acute intracranial abnormality. 2. ASPECTS is 10. 3. Severe chronic small vessel ischemic disease. These results were communicated to Dr. Quinn Axe at 12:02 pm on 05/09/2020 by text page via the [facility] messaging system. Electronically Signed   By: Logan Bores M.D.   On: 05/09/2020 12:02        Scheduled Meds: . acetaminophen  1,000 mg Oral BID  .  aspirin EC  81 mg Oral Daily  . bisacodyl  10 mg Rectal Once  . cetirizine  10 mg Oral QHS  . enoxaparin (LOVENOX) injection  40 mg Subcutaneous Q24H  . lisinopril  10 mg Oral Daily  . melatonin  3 mg Oral QHS  . OLANZapine zydis  5 mg Oral QHS  . sodium chloride flush  3 mL Intravenous Once  . sodium chloride flush  3 mL Intravenous Q12H  . vitamin B-12  1,000 mcg Oral Daily   Continuous Infusions: . sodium chloride       LOS: 0 days    Time spent: 34 minutes    Barb Merino, MD [facility] Pager [phone]

## 2020-05-11 NOTE — Progress Notes (Addendum)
Pt daughter [name] at bedside concerned for puffy like swelling left lower arm. Left arm soft and puffy with appearance of third spacing to this rn, No apparent limited movement noted in LUE  however pt does not follow command to grip daughters hand. No grimace or frowning noted with active movement of left arm/hand. 2+ left radial pulse palpated. Daughter request md to assess puffiness lue d/t recent fall pta, Secure chat sent to Dr Sloan Leiter

## 2020-05-11 NOTE — Progress Notes (Signed)
Pt fully alert oriented to name, pt fidgeting pulling medical equipment/tubing and resisting nursing care. Pt does not verbalize pain and no facial grimaces noted. Bilateral soft mittens applied, pt reoriented/reassured and safety protocols continued.  Haldol 1 mg ivp given.

## 2020-05-11 NOTE — Progress Notes (Signed)
With STROKE TEAM PROGRESS NOTE   INTERVAL HISTORY No family at the bedside. Pt lying in bed, not in distress. Neuro unchanged, orientated to his age and [place], but not to place or time. Limited speech output with perseveration, moving all extremities symmetrically.  Palliative care on board and PT/OT recommend SNF.   OBJECTIVE Vitals:   05/10/20 1149 05/10/20 1644 05/10/20 2300 05/11/20 0510  BP: 134/63 (!) 143/62 135/69 140/82  Pulse: 73 61 83 68  Resp: 18 18 18 18   Temp: 97.8 F (36.6 C) 97.7 F (36.5 C) 99.1 F (37.3 C) 98.4 F (36.9 C)  TempSrc: Oral Oral Axillary Axillary  SpO2: 100% 98% 97% 98%  Weight:        CBC:  Recent Labs  Lab 05/09/20 1148 05/09/20 1151  WBC 7.6  --   NEUTROABS 5.6  --   HGB 10.8* 11.6*  HCT 34.2* 34.0*  MCV 93.2  --   PLT 355  --     Basic Metabolic Panel:  Recent Labs  Lab 05/09/20 1148 05/09/20 1151  NA 138 140  K 3.9 3.9  CL 105 105  CO2 27  --   GLUCOSE 107* 102*  BUN 17 19  CREATININE 0.79 0.80  CALCIUM 9.0  --     Lipid Panel:     Component Value Date/Time   CHOL 227 (H) 05/09/2020 1148   TRIG 52 05/09/2020 1148   HDL 99 05/09/2020 1148   CHOLHDL 2.3 05/09/2020 1148   VLDL 10 05/09/2020 1148   LDLCALC 118 (H) 05/09/2020 1148   HgbA1c:  Lab Results  Component Value Date   HGBA1C 5.8 (H) 05/09/2020   Urine Drug Screen: No results found for: LABOPIA, COCAINSCRNUR, LABBENZ, AMPHETMU, THCU, LABBARB  Alcohol Level No results found for: ETH  IMAGING  MR BRAIN WO CONTRAST MR ANGIO HEAD WO CONTRAST MR ANGIO NECK WO CONTRAST 05/09/2020 IMPRESSION:  1. No acute intracranial finding. Extensive chronic small-vessel ischemic change of the cerebral hemispheric white matter.  2. No intracranial large or medium vessel occlusion or correctable proximal stenosis.  3. Atherosclerotic plaque at the distal ICA bulb region on the right with 30% stenosis. Left carotid bifurcation widely patent.    CT HEAD CODE STROKE WO  CONTRAST 05/09/2020 IMPRESSION:  1. No evidence of acute intracranial abnormality.  2. ASPECTS is 10.  3. Severe chronic small vessel ischemic disease.   CT CERVICAL SPINE WO CONTRAST 05/09/2020 IMPRESSION:  No evidence of acute cervical spine fracture or subluxation. Stable degenerative spondylosis, as described above.   DG Chest Port 1 View 05/09/2020 IMPRESSION:  No acute disease.  DG Pelvis 1-2 Views 05/09/2020 IMPRESSION:  No acute abnormality.   Transthoracic Echocardiogram  00/00/2021 1. Left ventricular ejection fraction, by estimation, is 60 to 65%. The  left ventricle has normal function. The left ventricle has no regional  wall motion abnormalities. Left ventricular diastolic parameters are  consistent with Grade I diastolic  dysfunction (impaired relaxation).  2. Right ventricular systolic function is normal. The right ventricular  size is normal. There is normal pulmonary artery systolic pressure. The  estimated right ventricular systolic pressure is 61.4 mmHg.  3. Left atrial size was moderately dilated.  4. The mitral valve is normal in structure. No evidence of mitral valve  regurgitation. No evidence of mitral stenosis.  5. The aortic valve is tricuspid. There is moderate calcification of the  aortic valve. There is moderate thickening of the aortic valve. Aortic  valve regurgitation is not visualized.  Mild to moderate aortic valve  sclerosis/calcification is present,  without any evidence of aortic stenosis. Aortic valve mean gradient  measures 3.0 mmHg. Aortic valve Vmax measures 1.31 m/s.  6. The inferior vena cava is dilated in size with <50% respiratory  variability, suggesting right atrial pressure of 15 mmHg.   ECG - SB rate 59 BPM. (See cardiology reading for complete details)  EEG - This technically difficult study is within normal limits. No seizures or epileptiform discharges were seen throughout the recording.   PHYSICAL EXAM Blood  pressure 140/82, pulse 68, temperature 98.4 F (36.9 C), temperature source Axillary, resp. rate 18, weight 61.2 kg, SpO2 98 %.   Temp:  [97.7 F (36.5 C)-99.1 F (37.3 C)] 98.4 F (36.9 C) (05/01 0510) Pulse Rate:  [61-83] 68 (05/01 0510) Resp:  [18] 18 (05/01 0510) BP: (134-143)/(62-82) 140/82 (05/01 0510) SpO2:  [97 %-100 %] 98 % (05/01 0510)  General - Well nourished, well developed, mildly restless with finger fidgety.  Ophthalmologic - fundi not visualized due to noncooperation.  Cardiovascular - Regular rhythm and rate.  Neuro - awake, alert, eyes open, orientated to self, age, and [place] but not to place, time or people. No aphasia, but paucity of speech, with intermittent word salad and perseveration, following limited simple commands. Able to name 1/3 and repeat certain words but not sentences. No gaze palsy, tracking bilaterally, blinking to visual threat bilaterally, PERRL. No facial droop. Bilateral UEs 3+/5, no drift. Bilaterally LEs 2/5 proximal but not cooperative with distal DF/PF checking, able to hold b/l leg in knee flexion and foot on bed position without drift. Sensation and FTN not cooperative, gait not tested.     ASSESSMENT/PLAN Mr. HOLDYN POYSER is a 87 y.o. male with history of vascular dementia, coronary artery disease s/p stenting, hypertension, hyperlipidemia, remote migraine, remote smoking history, bladder cancer, and multiple recent falls (hip replacement 11/2019 after falling) who presents to the ED as a Code Stroke after his wife noted Mr. Knisley was confused after sustaining a fall in the kitchen possibly striking his head. He did not receive IV t-PA due to non focal exam, unclear LKW, and possible head injury.  Encephalopathy from worsening cognitive impairment   CT Head - No evidence of acute intracranial abnormality.  MRI head - no acute finding  MRA H&N - No acute intracranial finding. Atherosclerotic plaque at the distal ICA bulb region on  the right with 30% stenosis.   2D Echo EF 60 to 65%  EEG within normal limits  [employer] Virus 2 - negative  LDL - 118  HgbA1c - 5.8  UA negative  Ammonia level within normal range  Elevated LFT, but improving  VTE prophylaxis - Lovenox  No antithrombotic prior to admission, now on aspirin 81 mg daily. Continue on discharge.   Ongoing aggressive stroke risk factor management  Therapy recommendations: SNF  Disposition:  Pending  Hypertension  Home BP meds: Lisinopril  Current BP meds: Lisinopril  Stable . Long-term BP goal normotensive  Hyperlipidemia  Home Lipid lowering medication: Zocor 20 mg daily  LDL 118, goal < 70  AST/ALT, improving, 152/322->83/151->57/123  Current lipid lowering medication: none due to elevated LFT  Resume statin once LFT normalized  Other Stroke Risk Factors  Advanced age  Former cigarette smoker - quit 60 years ago  Coronary artery disease status post stenting  Other Active Problems, Findings, Recommendations and/or Plan  Code status - DNR  Frequent falls at home  Vascular dementia  Bladder cancer  Palliative care on board   Hospital day # 0  Neurology will sign off. Please call with questions. Pt will follow up with Ernestine Mcmurray NP at [facility] in about 4 weeks. Thanks for the consult.  Rosalin Hawking, MD PhD Stroke Neurology 05/11/2020 9:52 AM         To contact Stroke Continuity provider, please refer to [email or web address] After hours, contact General Neurology

## 2020-05-12 DIAGNOSIS — K5901 Slow transit constipation: Secondary | ICD-10-CM | POA: Diagnosis not present

## 2020-05-12 DIAGNOSIS — E7800 Pure hypercholesterolemia, unspecified: Secondary | ICD-10-CM | POA: Diagnosis not present

## 2020-05-12 DIAGNOSIS — I10 Essential (primary) hypertension: Secondary | ICD-10-CM | POA: Diagnosis not present

## 2020-05-12 DIAGNOSIS — R296 Repeated falls: Secondary | ICD-10-CM | POA: Diagnosis not present

## 2020-05-12 DIAGNOSIS — F0151 Vascular dementia with behavioral disturbance: Secondary | ICD-10-CM | POA: Diagnosis not present

## 2020-05-12 DIAGNOSIS — G4709 Other insomnia: Secondary | ICD-10-CM | POA: Diagnosis not present

## 2020-05-12 DIAGNOSIS — Z515 Encounter for palliative care: Secondary | ICD-10-CM | POA: Diagnosis not present

## 2020-05-12 DIAGNOSIS — Z7189 Other specified counseling: Secondary | ICD-10-CM | POA: Diagnosis not present

## 2020-05-12 DIAGNOSIS — Z66 Do not resuscitate: Secondary | ICD-10-CM | POA: Diagnosis not present

## 2020-05-12 MED ORDER — ACETAMINOPHEN 500 MG PO TABS
1000.0000 mg | ORAL_TABLET | Freq: Two times a day (BID) | ORAL | Status: DC
  Administered 2020-05-12 – 2020-05-15 (×5): 1000 mg via ORAL
  Filled 2020-05-12 (×6): qty 2

## 2020-05-12 MED ORDER — OLANZAPINE 5 MG PO TBDP
5.0000 mg | ORAL_TABLET | Freq: Every day | ORAL | Status: DC
  Administered 2020-05-12 – 2020-05-14 (×3): 5 mg via ORAL
  Filled 2020-05-12 (×5): qty 1

## 2020-05-12 NOTE — Care Management (Addendum)
Received a call from Pattie at [place] at [employer]. She has been working with the family and is requesting H and P, PT notes and OT notes be faxed to her to see if they can provide the level of care needed.  Notes faxed to Pattie at [phone].  Phone [place]

## 2020-05-12 NOTE — Progress Notes (Signed)
Palliative:  HPI: 87 y.o. male  with past medical history of vascular dementia, CAD, diastolic heart failure, HTN, HLD, recent fall with sacral fracture 05/01/20, fall with left hip fracture s/p total hip 11/23/19, liver nodule (undergoing work up with Dr. Watt Climes), kidney stones, bladder cancer admitted on 05/09/2020 with multiple falls and altered mental status. MRI shows no evidence of stroke but advanced vascular disease.   I met today at Mr. Pelfrey bedside with daughter, [name]. We had a long discussion regarding her father's progressing dementia and potential trajectory. Difficult to say how he would do upon return to home or in facility. I would expect that he would continue to be at risk for fall and injury at any facility as there would not be anyone at his side 24/7. [name] has a list of private caregiver agencies she plans to reach out to them today to find out if there are options for care at home. We did discuss the potential of adding hospice care at home and how they could be helpful as I do worry how he will do overall after this fall and hospitalization. However, if he goes home and is eating well and ambulating and maintaining that hospice may even halt services. I would not have considered hospice for Mr. Schueler on Saturday as he was sitting up and talking (confused at times but talking in full sentences) and was reported to be eating fairly well at home prior to admission. However, today he is extremely lethargic and not really eating much. [name] is concerned that she will not be able to find 24/7 care at home and would still like to pursue options for rehab/placement. We did discuss that these options may be limited and not necessarily options of facilities they would prefer. They would like to know their options while working on option for home as well.   He was reported to sleep very well last night but is also extremely sleepy now (~1000 in morning). I will move Zyprexa up to 8pm and see if  this helps. I did discuss with [name] that sometimes it is a struggle, and sometimes not possible, to find a regimen that allows for good night sleep but doesn't make him too lethargic the next day. [name] has good understanding. [name] understands the trajectory of dementia and that her father would not desire this level or quality of life. Her focus is on quality of life and his happiness while keeping him safe. They will need more time to process and come to terms of choosing quality over quantity and further interventions to prolong his life.   All questions/concerns addressed. Emotional support provided.   Exam: Somnolent. Will respond verbally at times but only briefly. No distress. Breathing regular, unlabored. Abd soft LBM 5/1.   Plan: - SNF rehab vs long term placement vs return home with private caregivers (possibly with the addition of hospice). Family pursuing and discussing options.  - Changing Zyprexa to 5 mg earlier in the evening (2000) to see if this helps with daytime sleepiness. May need to decrease dosage. Will follow up on tolerance/efficacy tomorrow.   57 min  Vinie Sill, NP Palliative Medicine Team Pager [phone] (Please see [email or web address] for schedule) Team Phone [phone]    Greater than 50%  of this time was spent counseling and coordinating care related to the above assessment and plan

## 2020-05-12 NOTE — Progress Notes (Signed)
PROGRESS NOTE    Jesus Harmon  [id number] DOB: November 02, 1929 DOA: 05/09/2020 PCP: Alroy Dust, L.Marlou Sa, MD    Brief Narrative:  87 year old gentleman with history of progressive vascular dementia, recent fall and sacral fracture, total hip replacement in 2021, sinus bradycardia, hypertension and currently with increasing difficulty to manage symptoms at home brought from home with multiple falls, also found to have slurred speech, confusion and not recognizing his wife.  EMS activated code stroke and patient was brought to ER. In the emergency room hemodynamically stable.  Code stroke initiated secondary to fall and altered mental status.  CT head negative.  CT neck negative.  Skeletal survey negative.  Admitted due to neurological changes mostly related to altered mental status.   Assessment & Plan:   Active Problems:   Hyperlipidemia   Essential hypertension   Vascular dementia with behavior disturbance (HCC)   Liver nodule   Falls frequently   Multiple falls   Pressure injury of skin  Multiple falls/progressive debility and dementia/acute encephalopathy in the setting of underlying advanced vascular dementia: MRI of the brain with severe chronic microvascular disease.  MRA of the head and neck was essentially normal.  No evidence of stroke.  TIA and stroke ruled out. Urinalysis normal.  No evidence of acute infection.   EEG was done, less likely a seizure.  No indication for treatment. Patient with progressive dementia, his symptoms are likely related to advanced Alzheimer's dementia. He was seen by neurology in the office, could not tolerate therapeutics.  Plan: Continue to attempt to work with PT OT.  Refer to skilled nursing facility to attempt skilled rehab. Better night with Zyprexa 5 mg and melatonin 3 mg. Delirium precaution protocol.  Unrestricted visitor, unrestricted visiting hours due to altered mental status and help nighttime symptoms with family present. Palliative  care consultation to discuss palliation/may qualify for home hospice if going home.  Hypertension: Stable.  Home medications.  On lisinopril.  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  MRI was not diagnostic.  Does not look like metastatic cancer, however possible.Given patient's advanced frailty and deteriorating mental status, it is wise to prevent sedation and anesthesia.  I discussed this with patient's family that knowing the diagnosis will not help any treatment as he is not able to tolerate any treatment/chemotherapy or surgery at this stage of life.  If in case his mobility and mental status improves, he may benefit with diagnostic biopsy.  Family agreed.  Goal of care: Patient with advanced dementia and probably terminal stage.  Now with progressive symptoms. Digital discussion with patient's daughter Ms. Lisa at the bedside today. I recommended that if unable to participate with rehab, may benefit going home with home hospice. Palliative care discussion ongoing.  DVT prophylaxis: enoxaparin (LOVENOX) injection 40 mg Start: 05/09/20 1800   Code Status: DNR Family Communication: Daughter Lisa at the bedside. Disposition Plan: Status is: Observation  The patient will require care spanning > 2 midnights and should be moved to inpatient because: Unsafe d/c plan  Dispo: The patient is from: Home              Anticipated d/c is to: SNF              Patient currently is not medically stable to d/c.  Unsafe discharge plan.   Difficult to place patient No   Consultants:   Palliative medicine  Procedures:   None  Antimicrobials:   None   Subjective: Patient seen and examined.  His daughter was at the bedside.  Overnight had some agitations.  Received Zyprexa and melatonin.  He was pretty sleepy and comfortable at the time of my exam.  He could not participate.  Did not open  his eyes.  Objective: Vitals:   05/11/20 1616 05/11/20 2324 05/12/20 0520 05/12/20 1152  BP: 135/60 (!) 157/79 (!) 142/59 (!) 132/58  Pulse: 62 (!) 59 (!) 50 (!) 50  Resp: 18 19 16 16   Temp: 97.6 F (36.4 C) 97.7 F (36.5 C) 97.6 F (36.4 C) 97.6 F (36.4 C)  TempSrc: Oral Axillary Oral Oral  SpO2: 98% 96% 97% 98%  Weight:        Intake/Output Summary (Last 24 hours) at 05/12/2020 1329 Last data filed at 05/12/2020 1300 Gross per 24 hour  Intake 352 ml  Output 1150 ml  Net -798 ml   Filed Weights   05/09/20 1100  Weight: 61.2 kg    Examination:  General: Sick looking, lethargic gentleman.  Frail and debilitated. Cardiovascular: S1-S2 normal.  No added sounds. Respiratory: Bilateral clear.  No added sounds. Gastrointestinal: Soft and nontender.  Bowel sounds present. Ext: No swelling or deformity.  He has some tissue swelling on the left dorsum of the hand, no deformities or tenderness. Neuro: Patient is alert oriented x0.  Mostly sleepy.  Agitated when awake.  Unable to keep up conversation, no eye contact. Musculoskeletal: Joints are without any deformity or swelling.  Data Reviewed: I have personally reviewed following labs and imaging studies  CBC: Recent Labs  Lab 05/09/20 1148 05/09/20 1151  WBC 7.6  --   NEUTROABS 5.6  --   HGB 10.8* 11.6*  HCT 34.2* 34.0*  MCV 93.2  --   PLT 355  --    Basic Metabolic Panel: Recent Labs  Lab 05/09/20 1148 05/09/20 1151  NA 138 140  K 3.9 3.9  CL 105 105  CO2 27  --   GLUCOSE 107* 102*  BUN 17 19  CREATININE 0.79 0.80  CALCIUM 9.0  --    GFR: Estimated Creatinine Clearance: 55.3 mL/min (by C-G formula based on SCr of 0.8 mg/dL). Liver Function Tests: Recent Labs  Lab 05/09/20 1148 05/10/20 0415  AST 83* 57*  ALT 151* 123*  ALKPHOS 543* 503*  BILITOT 0.9 1.2  PROT 6.2* 5.7*  ALBUMIN 3.1* 2.8*   No results for input(s): LIPASE, AMYLASE in the last 168 hours. Recent Labs  Lab 05/10/20 0415  AMMONIA  27   Coagulation Profile: Recent Labs  Lab 05/09/20 1148  INR 0.9   Cardiac Enzymes: No results for input(s): CKTOTAL, CKMB, CKMBINDEX, TROPONINI in the last 168 hours. BNP (last 3 results) No results for input(s): PROBNP in the last 8760 hours. HbA1C: No results for input(s): HGBA1C in the last 72 hours. CBG: Recent Labs  Lab 05/09/20 1146  GLUCAP 93   Lipid Profile: No results for input(s): CHOL, HDL, LDLCALC, TRIG, CHOLHDL, LDLDIRECT in the last 72 hours. Thyroid Function Tests: No results for input(s): TSH, T4TOTAL, FREET4, T3FREE, THYROIDAB in the last 72 hours. Anemia Panel: No results for input(s): VITAMINB12, FOLATE, FERRITIN, TIBC, IRON, RETICCTPCT in the last 72 hours. Sepsis Labs: No results for input(s): PROCALCITON, LATICACIDVEN in the last 168 hours.  Recent Results (from the past 240 hour(s))  Resp Panel by RT-PCR (Flu A&B, Covid) Nasopharyngeal Swab     Status: None   Collection Time: 05/09/20 11:49 AM  Specimen: Nasopharyngeal Swab; Nasopharyngeal(NP) swabs in vial transport medium  Result Value Ref Range Status   SARS Coronavirus 2 by RT PCR NEGATIVE NEGATIVE Final    Comment: (NOTE) SARS-CoV-2 target nucleic acids are NOT DETECTED.  The SARS-CoV-2 RNA is generally detectable in upper respiratory specimens during the acute phase of infection. The lowest concentration of SARS-CoV-2 viral copies this assay can detect is 138 copies/mL. A negative result does not preclude SARS-Cov-2 infection and should not be used as the sole basis for treatment or other patient management decisions. A negative result may occur with  improper specimen collection/handling, submission of specimen other than nasopharyngeal swab, presence of viral mutation(s) within the areas targeted by this assay, and inadequate number of viral copies(<138 copies/mL). A negative result must be combined with clinical observations, patient history, and epidemiological information. The  expected result is Negative.  Fact Sheet for Patients:  EntrepreneurPulse.com.au  Fact Sheet for Healthcare Providers:  IncredibleEmployment.be  This test is no t yet approved or cleared by the [place] FDA and  has been authorized for detection and/or diagnosis of SARS-CoV-2 by FDA under an Emergency Use Authorization (EUA). This EUA will remain  in effect (meaning this test can be used) for the duration of the COVID-19 declaration under Section 564(b)(1) of the Act, 21 U.S.C.section 360bbb-3(b)(1), unless the authorization is terminated  or revoked sooner.       Influenza A by PCR NEGATIVE NEGATIVE Final   Influenza B by PCR NEGATIVE NEGATIVE Final    Comment: (NOTE) The Xpert Xpress SARS-CoV-2/FLU/RSV plus assay is intended as an aid in the diagnosis of influenza from Nasopharyngeal swab specimens and should not be used as a sole basis for treatment. Nasal washings and aspirates are unacceptable for Xpert Xpress SARS-CoV-2/FLU/RSV testing.  Fact Sheet for Patients: EntrepreneurPulse.com.au  Fact Sheet for Healthcare Providers: IncredibleEmployment.be  This test is not yet approved or cleared by the [place] FDA and has been authorized for detection and/or diagnosis of SARS-CoV-2 by FDA under an Emergency Use Authorization (EUA). This EUA will remain in effect (meaning this test can be used) for the duration of the COVID-19 declaration under Section 564(b)(1) of the Act, 21 U.S.C. section 360bbb-3(b)(1), unless the authorization is terminated or revoked.  Performed at [facility], [place] [address] [place], [place] [address]          Radiology Studies: EEG adult  Result Date: 2020-05-22 Lora Havens, MD     May 22, 2020  7:35 PM Patient Name: Jesus Harmon MRN: [id number] Epilepsy Attending: Lora Havens Referring Physician/Provider: Dr Rosalin Hawking Date: 2020-05-22  Duration: 26.29 mins Patient history: 87 year old male with vascular dementia, frequent falls, and longstanding history of balance problems who presented today as a Code Stroke for evaluation of confusion and aphasia after sustaining a fall (possibly hitting head). EEG to evaluate for seizure. Level of alertness: Awake AEDs during EEG study: None Technical aspects: This EEG study was done with scalp electrodes positioned according to the 10-20 International system of electrode placement. Electrical activity was acquired at a sampling rate of 500Hz  and reviewed with a high frequency filter of 70Hz  and a low frequency filter of 1Hz . EEG data were recorded continuously and digitally stored. Description: The posterior dominant rhythm consists of 8-9 Hz activity of moderate voltage (25-35 uV) seen predominantly in posterior head regions, symmetric and reactive to eye opening and eye closing. Drowsiness was characterized by attenuation of the posterior background rhythm. Hyperventilation and photic stimulation were not  performed.   Of note, study was technically difficult due to significant electrode artifact as patient was confused and pulling electrodes. IMPRESSION: This technically difficult study is within normal limits. No seizures or epileptiform discharges were seen throughout the recording. If concern for ictal-interictal activity persists, can consider prolonged monitoring Charlsie Quest   ECHOCARDIOGRAM COMPLETE  Result Date: 05/10/2020    ECHOCARDIOGRAM REPORT   Patient Name:   RATHANA HERING Date of Exam: 05/10/2020 Medical Rec #:  [id number]       Height:       71.0 in Accession #:    [id number]      Weight:       134.9 lb Date of Birth:  [birth year]       BSA:          1.784 m Patient Age:    89 years        BP:           134/63 mmHg Patient Gender: M               HR:           59 bpm. Exam Location:  Inpatient Procedure: 2D Echo, Cardiac Doppler and Color Doppler Indications:    Stroke  History:         Patient has prior history of Echocardiogram examinations, most                 recent 02/24/2018. Risk Factors:Hypertension and Dyslipidemia.                 Bradycardia.  Sonographer:    Ross Ludwig RDCS (AE) Referring Phys: [id number] Reyne Dumas [facility] IMPRESSIONS  1. Left ventricular ejection fraction, by estimation, is 60 to 65%. The left ventricle has normal function. The left ventricle has no regional wall motion abnormalities. Left ventricular diastolic parameters are consistent with Grade I diastolic dysfunction (impaired relaxation).  2. Right ventricular systolic function is normal. The right ventricular size is normal. There is normal pulmonary artery systolic pressure. The estimated right ventricular systolic pressure is 25.8 mmHg.  3. Left atrial size was moderately dilated.  4. The mitral valve is normal in structure. No evidence of mitral valve regurgitation. No evidence of mitral stenosis.  5. The aortic valve is tricuspid. There is moderate calcification of the aortic valve. There is moderate thickening of the aortic valve. Aortic valve regurgitation is not visualized. Mild to moderate aortic valve sclerosis/calcification is present, without any evidence of aortic stenosis. Aortic valve mean gradient measures 3.0 mmHg. Aortic valve Vmax measures 1.31 m/s.  6. The inferior vena cava is dilated in size with <50% respiratory variability, suggesting right atrial pressure of 15 mmHg. Conclusion(s)/Recommendation(s): No intracardiac source of embolism detected on this transthoracic study. A transesophageal echocardiogram is recommended to exclude cardiac source of embolism if clinically indicated. FINDINGS  Left Ventricle: Left ventricular ejection fraction, by estimation, is 60 to 65%. The left ventricle has normal function. The left ventricle has no regional wall motion abnormalities. The left ventricular internal cavity size was normal in size. There is  no left ventricular hypertrophy. Left ventricular  diastolic parameters are consistent with Grade I diastolic dysfunction (impaired relaxation). Right Ventricle: The right ventricular size is normal. No increase in right ventricular wall thickness. Right ventricular systolic function is normal. There is normal pulmonary artery systolic pressure. The tricuspid regurgitant velocity is 1.64 m/s, and  with an assumed right atrial pressure of 15 mmHg, the estimated right ventricular systolic  pressure is 25.8 mmHg. Left Atrium: Left atrial size was moderately dilated. Right Atrium: Right atrial size was normal in size. Pericardium: There is no evidence of pericardial effusion. Mitral Valve: The mitral valve is normal in structure. No evidence of mitral valve regurgitation. No evidence of mitral valve stenosis. MV peak gradient, 2.4 mmHg. The mean mitral valve gradient is 1.0 mmHg. Tricuspid Valve: The tricuspid valve is normal in structure. Tricuspid valve regurgitation is trivial. No evidence of tricuspid stenosis. Aortic Valve: The aortic valve is tricuspid. There is moderate calcification of the aortic valve. There is moderate thickening of the aortic valve. Aortic valve regurgitation is not visualized. Mild to moderate aortic valve sclerosis/calcification is present, without any evidence of aortic stenosis. Aortic valve mean gradient measures 3.0 mmHg. Aortic valve peak gradient measures 6.9 mmHg. Aortic valve area, by VTI measures 2.97 cm. Pulmonic Valve: The pulmonic valve was normal in structure. Pulmonic valve regurgitation is trivial. No evidence of pulmonic stenosis. Aorta: The aortic root is normal in size and structure. Venous: The inferior vena cava is dilated in size with less than 50% respiratory variability, suggesting right atrial pressure of 15 mmHg. IAS/Shunts: No atrial level shunt detected by color flow Doppler.  LEFT VENTRICLE PLAX 2D LVIDd:         4.70 cm  Diastology LVIDs:         2.80 cm  LV e' medial:    8.59 cm/s LV PW:         1.00 cm  LV  E/e' medial:  8.9 LV IVS:        0.90 cm  LV e' lateral:   12.10 cm/s LVOT diam:     2.20 cm  LV E/e' lateral: 6.3 LV SV:         81 LV SV Index:   46 LVOT Area:     3.80 cm  RIGHT VENTRICLE             IVC RV Basal diam:  3.80 cm     IVC diam: 2.20 cm RV Mid diam:    2.60 cm RV S prime:     14.60 cm/s TAPSE (M-mode): 2.5 cm LEFT ATRIUM             Index       RIGHT ATRIUM           Index LA diam:        2.60 cm 1.46 cm/m  RA Area:     19.80 cm LA Vol (A2C):   47.2 ml 26.46 ml/m RA Volume:   58.70 ml  32.91 ml/m LA Vol (A4C):   41.4 ml 23.21 ml/m LA Biplane Vol: 48.1 ml 26.97 ml/m  AORTIC VALVE AV Area (Vmax):    3.28 cm AV Area (Vmean):   3.15 cm AV Area (VTI):     2.97 cm AV Vmax:           131.00 cm/s AV Vmean:          86.800 cm/s AV VTI:            0.274 m AV Peak Grad:      6.9 mmHg AV Mean Grad:      3.0 mmHg LVOT Vmax:         113.00 cm/s LVOT Vmean:        71.900 cm/s LVOT VTI:          0.214 m LVOT/AV VTI ratio: 0.78  AORTA Ao Root diam: 3.30 cm Ao  Asc diam:  3.40 cm MITRAL VALVE               TRICUSPID VALVE MV Area (PHT): 2.87 cm    TR Peak grad:   10.8 mmHg MV Area VTI:   2.99 cm    TR Vmax:        164.00 cm/s MV Peak grad:  2.4 mmHg MV Mean grad:  1.0 mmHg    SHUNTS MV Vmax:       0.77 m/s    Systemic VTI:  0.21 m MV Vmean:      46.9 cm/s   Systemic Diam: 2.20 cm MV Decel Time: 264 msec MV E velocity: 76.30 cm/s MV A velocity: 57.80 cm/s MV E/A ratio:  1.32 Candee Furbish MD Electronically signed by Candee Furbish MD Signature Date/Time: 05/10/2020/2:38:10 PM    Final         Scheduled Meds: . acetaminophen  1,000 mg Oral BID  . aspirin EC  81 mg Oral Daily  . cetirizine  10 mg Oral QHS  . enoxaparin (LOVENOX) injection  40 mg Subcutaneous Q24H  . lisinopril  10 mg Oral Daily  . melatonin  3 mg Oral QHS  . OLANZapine zydis  5 mg Oral QHS  . sodium chloride flush  3 mL Intravenous Once  . sodium chloride flush  3 mL Intravenous Q12H  . vitamin B-12  1,000 mcg Oral Daily    Continuous Infusions: . sodium chloride       LOS: 0 days    Time spent: 32 minutes    Barb Merino, MD [facility] Pager [phone]

## 2020-05-12 NOTE — TOC Progression Note (Addendum)
`  qTransition of Care [facility]) - Progression Note    Patient Details  Name: Jesus Harmon MRN: [id number] Date of Birth: Nov 24, 1929  Transition of Care [facility]) CM/SW [place], [place] Phone Number: 05/12/2020, 3:04 PM  Clinical Narrative:     CSW gave bed offers to pt and daughter and pts spouse. Daughter was not happy with the one offer presented to them due to it being a 2 star. CSW ill continue to check for any additional bed offers. CSW explained that a decision would need to be made soon to start insurance auth.   4:00pm- CSW gave family an additional bed offer. Family still not happy with choice. Family called pts other daughter [name] on phone. [name] stated she was under the impression they had more time to make a decision and that she is not sending her dad to a 2 star facility.  CSW expanded search to [place], [place], [place] area SNF's per familys request. CSW informed family that [facility] requested PT notes so they can determine if they can accept pt. Family stated they had reahed out to [facility] before his fall.  CSW started insurance [employer], reference number [id number].   Expected Discharge Plan: [place] Barriers to Discharge: SNF Pending bed offer  Expected Discharge Plan and Services Expected Discharge Plan: [place] arrangements for the past 2 months: Assisted Living Facility                                       Social Determinants of Health (SDOH) Interventions    Readmission Risk Interventions Readmission Risk Prevention Plan 11/25/2019  Medication Screening Complete  Transportation Screening Complete  Some recent data might be hidden   .Emeterio Reeve, Latanya Presser, [place] Social Worker [phone]

## 2020-05-12 NOTE — Progress Notes (Signed)
Occupational Therapy Treatment Patient Details Name: Jesus Harmon MRN: [id number] DOB: [birth year] Today's Date: 05/12/2020    History of present illness Pt is an 87 y.o. male admitted 05/09/20 AMS, multiple falls, slurred speech, PMH includes progressive vascular dementia, recent fall with sacral fracture (05/01/20), THR in November '21, h/ sinus bradycardia, HTN.   OT comments  Increased lethargy and agitation this session. A little more awake with positional changes - family present throughout. Max A +2 for bed mobility, max A for pivot to recliner, Pt declined participation in grooming tasks and seemed to be agitated with therapist. Family stating that it was very out of character. PT left in chair with alarm and family present. OT will continue to follow acutely and continue to recommend SNF post-acute.   Follow Up Recommendations  SNF    Equipment Recommendations  Other (comment) (defer to next venue, Pt has appropriate DME)    Recommendations for Other Services      Precautions / Restrictions Precautions Precautions: Fall Restrictions Weight Bearing Restrictions: No       Mobility Bed Mobility Overal bed mobility: Needs Assistance Bed Mobility: Supine to Sit     Supine to sit: Max assist;+2 for physical assistance     General bed mobility comments: multimodal cues and max A +2 for all aspects of bed mobility, increased initial lethargy with no difference in arousal with positional changes    Transfers Overall transfer level: Needs assistance Equipment used: 1 person hand held assist Transfers: Stand Pivot Transfers Sit to Stand: Max assist;+2 safety/equipment (daughter Investment banker, corporate) assisted) Stand pivot transfers: Max assist;+2 safety/equipment       General transfer comment: Cues for eyes to open, assist for all aspects of pivot, multimodal cues    Balance Overall balance assessment: Needs assistance Sitting-balance support: Bilateral upper extremity supported;Feet  supported Sitting balance-Leahy Scale: Poor Sitting balance - Comments: dependent on at least mod A for balanace EOB Postural control: Posterior lean   Standing balance-Leahy Scale: Zero Standing balance comment: dependent on therapist/outside support                           ADL either performed or assessed with clinical judgement   ADL Overall ADL's : Needs assistance/impaired Eating/Feeding: Minimal assistance;Sitting   Grooming: Wash/dry face;Moderate assistance;Sitting                   Toilet Transfer: Maximal assistance;Stand-pivot Toilet Transfer Details (indicate cue type and reason): simulated through bed>recliner Toileting- Clothing Manipulation and Hygiene: Maximal assistance       Functional mobility during ADLs: Maximal assistance (face to face) General ADL Comments: decreased cognition and participation this session     Vision       Perception     Praxis      Cognition Arousal/Alertness: Lethargic Behavior During Therapy: Flat affect;Agitated Overall Cognitive Status: Impaired/Different from baseline Area of Impairment: Orientation;Attention;Following commands;Safety/judgement;Awareness                 Orientation Level: Disoriented to;Place;Time;Situation Current Attention Level: Focused   Following Commands: Follows one step commands inconsistently;Follows one step commands with increased time Safety/Judgement: Decreased awareness of safety;Decreased awareness of deficits Awareness: Intellectual   General Comments: increased confusion and agitation from previous session. Telling OT to "shut up" and Pt's family reporting that is very out of character        Exercises     Shoulder Instructions       General  Comments Daughter(s) Present, wife present, decreased arousal today    Pertinent Vitals/ Pain       Pain Assessment: No/denies pain Pain Intervention(s): Monitored during session  Home Living                                           Prior Functioning/Environment              Frequency  Min 2X/week        Progress Toward Goals  OT Goals(current goals can now be found in the care plan section)  Progress towards OT goals: Not progressing toward goals - comment (decreased cognition, increased lethargy)  Acute Rehab OT Goals Patient Stated Goal: unable to state; daughters goal is safe mobility OT Goal Formulation: With family Time For Goal Achievement: 05/24/20 Potential to Achieve Goals: [place] Discharge plan remains appropriate    Co-evaluation                 AM-PAC OT "6 Clicks" Daily Activity     Outcome Measure   Help from another person eating meals?: A Lot Help from another person taking care of personal grooming?: A Lot Help from another person toileting, which includes using toliet, bedpan, or urinal?: A Lot Help from another person bathing (including washing, rinsing, drying)?: A Lot Help from another person to put on and taking off regular upper body clothing?: A Lot Help from another person to put on and taking off regular lower body clothing?: A Lot 6 Click Score: 12    End of Session Equipment Utilized During Treatment: Gait belt  OT Visit Diagnosis: Unsteadiness on feet (R26.81);Repeated falls (R29.6);Muscle weakness (generalized) (M62.81);History of falling (Z91.81);Other symptoms and signs involving cognitive function   Activity Tolerance Patient limited by lethargy   Patient Left in chair;with call bell/phone within reach;with chair alarm set;with family/visitor present   Nurse Communication Mobility status        Time: 9629-5284 OT Time Calculation (min): 25 min  Charges: OT General Charges $OT Visit: 1 Visit OT Treatments $Therapeutic Activity: 8-22 mins Jesse Sans OTR/L Acute Rehabilitation Services Pager: [phone] Office: [place] 05/12/2020, 1:25 PM

## 2020-05-13 ENCOUNTER — Other Ambulatory Visit (HOSPITAL_COMMUNITY): Payer: Self-pay

## 2020-05-13 DIAGNOSIS — K5901 Slow transit constipation: Secondary | ICD-10-CM | POA: Diagnosis not present

## 2020-05-13 DIAGNOSIS — G4709 Other insomnia: Secondary | ICD-10-CM | POA: Diagnosis not present

## 2020-05-13 DIAGNOSIS — I10 Essential (primary) hypertension: Secondary | ICD-10-CM | POA: Diagnosis not present

## 2020-05-13 DIAGNOSIS — Z66 Do not resuscitate: Secondary | ICD-10-CM | POA: Diagnosis not present

## 2020-05-13 DIAGNOSIS — Z7189 Other specified counseling: Secondary | ICD-10-CM | POA: Diagnosis not present

## 2020-05-13 DIAGNOSIS — Z515 Encounter for palliative care: Secondary | ICD-10-CM | POA: Diagnosis not present

## 2020-05-13 DIAGNOSIS — R296 Repeated falls: Secondary | ICD-10-CM | POA: Diagnosis not present

## 2020-05-13 DIAGNOSIS — E7800 Pure hypercholesterolemia, unspecified: Secondary | ICD-10-CM | POA: Diagnosis not present

## 2020-05-13 DIAGNOSIS — F0151 Vascular dementia with behavioral disturbance: Secondary | ICD-10-CM | POA: Diagnosis not present

## 2020-05-13 MED ORDER — OLANZAPINE 5 MG PO TBDP
5.0000 mg | ORAL_TABLET | Freq: Every day | ORAL | 0 refills | Status: DC
  Filled 2020-05-13: qty 7, 7d supply, fill #0

## 2020-05-13 MED ORDER — OLANZAPINE 5 MG PO TBDP: 5.0000 mg | ORAL_TABLET | Freq: Every day | ORAL | 0 refills | Status: DC

## 2020-05-13 MED ORDER — MELATONIN 3 MG PO TABS: 3.0000 mg | ORAL_TABLET | Freq: Every day | ORAL | 0 refills | Status: AC

## 2020-05-13 NOTE — Progress Notes (Signed)
Palliative:  HPI: 87 y.o.malewith past medical history of vascular dementia, CAD, diastolic heart failure, HTN, HLD,recent fall with sacral fracture 05/01/20, fall with left hip fracture s/p total hip 11/23/19,liver nodule (undergoing work up with Dr. Watt Climes), kidney stones, bladder canceradmitted on 4/29/2022with multiple falls and altered mental status.MRI shows no evidence of stroke but advanced vascular disease.  I met today with Jesus Harmon and his wife and 2 daughters at bedside. We discussed difficulty with finding placement. I secure chat with CSW and update him on family requests and the fact that they can pay out of pocket for long term care and does not need to be rehab bed. Updated family that the issue seems to be his dementia and the facilities are giving feedback that they can manage his needs. After extensive discussion regarding expectations that if he returns home he may have some improvement in status but he may not and he may continue to decline. However, in a facility I anticipate he would struggle to have much improvement. We discussed therapy services, palliative care, and hospice services. Family would like to wait for final answers from some of the facilities while they arrange for 24/7 caregivers at home. They are prepared to return home if no beds offered to facilities they would find acceptable.   All questions/concerns addressed. Emotional support provided.   Exam: More alert today but still sleepy. He has been able to awaken and eat. No distress. Breathing regular, unlabored. Abd soft. Moves all extremities.   Plan: - Follow up with facilities for any further bed offers.  - Family is in process of coordinating secondary plan to take patient home with 24/7 private caregivers. They would desire bed and wheelchair for home and home health services and therapy. They would also like palliative referral with potential need for hospice in the future.   35 min  Jesus Sill, NP Palliative Medicine Team Pager [phone] (Please see [email or web address] for schedule) Team Phone [phone]    Greater than 50%  of this time was spent counseling and coordinating care related to the above assessment and plan

## 2020-05-13 NOTE — Progress Notes (Signed)
Physical Therapy Treatment Patient Details Name: Jesus Harmon MRN: [id number] DOB: 1929-11-05 Today's Date: 05/13/2020    History of Present Illness Pt is an 87 y.o. male admitted 05/09/20 AMS, multiple falls, slurred speech, PMH includes progressive vascular dementia, recent fall with sacral fracture (05/01/20), THR in November '21, h/ sinus bradycardia, HTN.    PT Comments    Pt continues to have very limited mobility and poor balance. Able to stand with max assist but unable to take any steps. Worked a lot on sitting balance to facilitate safer sitting EOB. Pt calm and cooperative. Expect progress will continue to be slow but hopefully with repetition of gross mobility pt will progress.   Follow Up Recommendations  SNF     Equipment Recommendations  None recommended by PT    Recommendations for Other Services       Precautions / Restrictions Precautions Precautions: Fall Restrictions Weight Bearing Restrictions: No    Mobility  Bed Mobility Overal bed mobility: Needs Assistance Bed Mobility: Supine to Sit;Sit to Supine     Supine to sit: Max assist;HOB elevated Sit to supine: Max assist   General bed mobility comments: Assist with all aspects. Pt with posterior lean coming to sitting    Transfers Overall transfer level: Needs assistance Equipment used: Rolling Stinger (2 wheeled) Transfers: Sit to/from Stand Sit to Stand: Max assist         General transfer comment: Heavy assist to bring hips up due to posterior lean. Incr time to rise and incr time to more fully extend hips, knees, and trunk and never reaching full extension. Had pt place hands on handles of [place] to encourage anterior wt shift. Stood 4x with Beissel. Stood 1x with stedy but in stedy pt unable to extend into full stand.  Ambulation/Gait             General Gait Details: Unable with 1 person max assist   Stairs             Wheelchair Mobility    Modified Rankin (Stroke  Patients Only)       Balance Overall balance assessment: Needs assistance Sitting-balance support: Bilateral upper extremity supported;Feet supported Sitting balance-Leahy Scale: Poor Sitting balance - Comments: Sat EOB x 20 minutes. Initially required max assist due to posterior lean. Assisted pt to maintain midline and used verbal/tactile cues for him to correct lean. Eventually pt able to sit with UE support and min guard for ~3 minutes with intermittent verbal cues. Postural control: Posterior lean;Left lateral lean   Standing balance-Leahy Scale: Zero Standing balance comment: mod to max to maintain standing. Stood x 4 with Beckett for 10-20 sec. Partial stand with stedy x 10 seconds                            Cognition Arousal/Alertness: Awake/alert Behavior During Therapy: Flat affect Overall Cognitive Status: Impaired/Different from baseline Area of Impairment: Orientation;Attention;Following commands;Safety/judgement;Awareness;Memory                 Orientation Level: Disoriented to;Place;Time;Situation Current Attention Level: Sustained Memory: Decreased short-term memory Following Commands: Follows one step commands inconsistently;Follows one step commands with increased time Safety/Judgement: Decreased awareness of safety;Decreased awareness of deficits Awareness: Intellectual   General Comments: Pt  cooperative and calm.      Exercises      General Comments        Pertinent Vitals/Pain Pain Assessment: No/denies pain    Home Living  Prior Function            PT Goals (current goals can now be found in the care plan section) Acute Rehab PT Goals Patient Stated Goal: unable to state Progress towards PT goals: Goals downgraded-see care plan    Frequency    Min 3X/week      PT Plan Current plan remains appropriate;Frequency needs to be updated    Co-evaluation              AM-PAC PT "6  Clicks" Mobility   Outcome Measure  Help needed turning from your back to your side while in a flat bed without using bedrails?: A Lot Help needed moving from lying on your back to sitting on the side of a flat bed without using bedrails?: A Lot Help needed moving to and from a bed to a chair (including a wheelchair)?: Total Help needed standing up from a chair using your arms (e.g., wheelchair or bedside chair)?: Total Help needed to walk in hospital room?: Total Help needed climbing 3-5 steps with a railing? : Total 6 Click Score: 8    End of Session Equipment Utilized During Treatment: Gait belt Activity Tolerance: Patient tolerated treatment well Patient left: in bed;with call bell/phone within reach;with bed alarm set;with family/visitor present;with nursing/sitter in room Nurse Communication: Mobility status PT Visit Diagnosis: Other abnormalities of gait and mobility (R26.89);Muscle weakness (generalized) (M62.81);Difficulty in walking, not elsewhere classified (R26.2);Repeated falls (R29.6);Unsteadiness on feet (R26.81)     Time: 5573-2202 PT Time Calculation (min) (ACUTE ONLY): 37 min  Charges:  $Therapeutic Activity: 23-37 mins                     [place] Pager [phone] Office [place] 05/13/2020, 11:33 AM

## 2020-05-13 NOTE — Care Management (Signed)
    Durable Medical Equipment  (From admission, onward)         Start     Ordered   Unscheduled  For home use only DME Hospital bed  Once       Comments: Call daughter Manon [place] [phone] or daughter Ozella [place]ocks [phone] for delivery thanks  Question Answer Comment  Length of Need Lifetime   Patient has (list medical condition): Multiple falls/progressive debility and dementia/acute encephalopathy in the setting of underlying advanced vascular dementia   The above medical condition requires: Patient requires the ability to reposition frequently   Head must be elevated greater than: 45 degrees   Bed type Semi-electric   Support Surface: Gel Overlay      05/13/20 1440   Unscheduled  For home use only DME standard manual wheelchair with seat cushion  Once       Comments: Patient suffers from Multiple falls/progressive debility and dementia/acute encephalopathy in the setting of underlying advanced vascular dementia which impairs their ability to perform daily activities like ambulating  in the home.  A cane  will not resolve issue with performing activities of daily living. A wheelchair will allow patient to safely perform daily activities. Patient can safely propel the wheelchair in the home or has a caregiver who can provide assistance. Length of need lifetime . Accessories: elevating leg rests (ELRs), wheel locks, extensions and anti-tippers.  Seat and back cushions   05/13/20 1440

## 2020-05-13 NOTE — TOC Progression Note (Addendum)
Transition of Care [facility]) - Progression Note    Patient Details  Name: Jesus Harmon MRN: [id number] Date of Birth: [birth year]  Transition [facility] CM/SW [place], [place] Phone Number: 05/13/2020, 12:19 PM  Clinical Narrative:     CSW met with pt daughter Carlyon Prows, and MD at the same time at nursing station. [name] explained that facilities that have offered a bed are not acceptable. CSW explained that bed search was expanded yesterday and pt received 1 other offer with [facility] and this facility is also 1 star rated. [name] explained that she was told that pt would need to choose a facility by today but indicated that she didn't think that was true. CSW explained that pt was medically read which MD confirmed. Md explained that pt wouldn't be discharged without a safe d/c plan. CSW explained that pt does have a safe discharge plan with offers from 4 facilities at this time. CSW also discussed with [name] the potential for [facility] with private duty care givers. [name] then walked to pt room. CSW followed to discuss situation further. [name] explained she would have to discuss options with her sister. CSW wrote down name and phone number to be contacted.   CSW then walked back over to consult with RNCM, RN, and MD. Daughter walked over shortly after and emphasized that she would not send her father to one of the facilities that has offered a bed. She explained she would take pt home before she sent him to one of these facilities. She expresses that 4 star facilities and up are preferred. Daughter requests that pt is able to have improved sleep and to talk with palliative prior to pt discharging. TOC will continue to follow.   CSW resent referrals to [facility], [facility], and [facility]  To inquire about possible availability. CSW awaiting on response.  1249: CSW is informed by palliative that family wants to try [facility]. Pt has already been denied by [facility] with the following reason: "cannot meet patient's needs" CSW contacted [place] with [facility] to review pt again for [place] location. Clinicals re-sent.   1408: CSW is informed by palliative that family also wanting to check with [facility]. CSW called and left message with [facility] admissions.  -[facility] explained they cannot accept pt due to being unable to meet pt's care needs. -[facility] cannot accept pt due to being unable to meet pt's care needs; they don't have any beds currently either  1605: [facility] unable to accept pt at this time; no foreseeable beds     Expected Discharge Plan: Skilled Nursing Facility Barriers to Discharge: SNF Pending bed offer  Expected Discharge Plan and Services Expected Discharge Plan: [place] arrangements for the past 2 months: Assisted Living Facility                                       Social Determinants of Health (SDOH) Interventions    Readmission Risk Interventions Readmission Risk Prevention Plan 11/25/2019  Medication Screening Complete  Transportation Screening Complete  Some recent data might be hidden

## 2020-05-13 NOTE — Progress Notes (Signed)
PROGRESS NOTE    MILLEDGE GERDING  HAL:[id number] DOB: 09-13-1929 DOA: 05/09/2020 PCP: Alroy Dust, L.Marlou Sa, MD    Brief Narrative:  87 year old gentleman with history of progressive vascular dementia, recent fall and sacral fracture, total hip replacement in 2021, sinus bradycardia, hypertension and currently with increasing difficulty to manage symptoms at home brought from home with multiple falls, also found to have slurred speech, confusion and not recognizing his wife.  EMS activated code stroke and patient was brought to ER. In the emergency room hemodynamically stable.  Code stroke initiated secondary to fall and altered mental status.  CT head negative.  CT neck negative.  Skeletal survey negative.  Admitted due to neurological changes mostly related to altered mental status.   Assessment & Plan:   Active Problems:   Hyperlipidemia   Essential hypertension   Vascular dementia with behavior disturbance (HCC)   Liver nodule   Falls frequently   Multiple falls   Pressure injury of skin  Multiple falls/progressive debility and dementia/acute encephalopathy in the setting of underlying advanced vascular dementia: MRI of the brain with severe chronic microvascular disease.  MRA of the head and neck was essentially normal.  No evidence of stroke.  TIA and stroke ruled out. Urinalysis normal.  No evidence of acute infection.   EEG was done, less likely a seizure.  No indication for treatment. Patient with progressive dementia, his symptoms are likely related to advanced Alzheimer's dementia. He was seen by neurology in the office, could not tolerate therapeutics.  Plan: Continue to attempt to work with PT/ OT.  Referred to skilled nursing facility to attempt skilled rehab. Better night with Zyprexa 5 mg and melatonin 3 mg.   Hypertension: Stable.  Home medications.  On lisinopril.  Right dome of liver lesion: Patient was recently diagnosed with undifferentiated right liver lesion,  solitary with mild abnormal liver functions.  MRI was not diagnostic.  Does not look like metastatic cancer, however possible.Given patient's advanced frailty and deteriorating mental status, it is wise to prevent sedation and anesthesia.  I discussed this with patient's family that knowing the diagnosis will not help any treatment as he is not able to tolerate any treatment/chemotherapy or surgery at this stage of life.  If in case his mobility and mental status improves, he may benefit with diagnostic biopsy.  Family agreed.  Goal of care: Patient with advanced dementia. Now with progressive symptoms. Digital discussion with patient's daughter Ms. Lisa at the bedside today. Family still considering short-term rehab to see how he does.  Conference call with social workers, patient's daughter [name] to find appropriate disposition. Family not finding acceptable skilled nursing rehab. Also considering going home with caregivers, ongoing discussions.  DVT prophylaxis: enoxaparin (LOVENOX) injection 40 mg Start: 05/09/20 1800   Code Status: DNR Family Communication: Daughter Lisa at the bedside. Disposition Plan: Status is: Observation  The patient will require care spanning > 2 midnights and should be moved to inpatient because: Unsafe d/c plan  Dispo: The patient is from: Home              Anticipated d/c is to: SNF              Patient currently is medically stable to DC if safe disposition.   Difficult to place patient No   Consultants:   Palliative medicine  Procedures:   None  Antimicrobials:   None   Subjective: Patient seen and examined.  He had better night.  Remained quiet last night. I  interviewed patient he was trying to work with physical therapy, today he was slightly interactive and answered few questions appropriately.  Followed simple commands. "Am I supposed to get out of here today?"  Objective: Vitals:   05/12/20 1152 05/12/20 1811 05/12/20 2338 05/13/20  0455  BP: (!) 132/58 (!) 152/67 (!) 158/75 (!) 137/59  Pulse: (!) 50 67 65 (!) 58  Resp: 16 16 18 16   Temp: 97.6 F (36.4 C) 97.8 F (36.6 C) 97.8 F (36.6 C) 97.9 F (36.6 C)  TempSrc: Oral Oral Axillary Axillary  SpO2: 98% 98% 100% 96%  Weight:        Intake/Output Summary (Last 24 hours) at 05/13/2020 1123 Last data filed at 05/13/2020 0504 Gross per 24 hour  Intake 120 ml  Output 800 ml  Net -680 ml   Filed Weights   05/09/20 1100  Weight: 61.2 kg    Examination:  General: Frail and debilitated gentleman.  Today he is not in any distress.  His mood is quiet and calm. He is alert and awake but not oriented.  He did tell me he lives in [place] but does not know anything else. Cardiovascular: S1-S2 normal.  No added sounds. Respiratory: Bilateral clear.  No added sounds. Gastrointestinal: Soft and nontender.  Bowel sounds present. Ext: No swelling or deformity.  He has some tissue swelling on the left dorsum of the hand, no deformities or tenderness. Neuro: Patient is alert oriented x0.  Able to keep up simple conversation and answer simple questions.  Musculoskeletal: Joints are without any deformity or swelling.  Data Reviewed: I have personally reviewed following labs and imaging studies  CBC: Recent Labs  Lab 05/09/20 1148 05/09/20 1151  WBC 7.6  --   NEUTROABS 5.6  --   HGB 10.8* 11.6*  HCT 34.2* 34.0*  MCV 93.2  --   PLT 355  --    Basic Metabolic Panel: Recent Labs  Lab 05/09/20 1148 05/09/20 1151  NA 138 140  K 3.9 3.9  CL 105 105  CO2 27  --   GLUCOSE 107* 102*  BUN 17 19  CREATININE 0.79 0.80  CALCIUM 9.0  --    GFR: Estimated Creatinine Clearance: 55.3 mL/min (by C-G formula based on SCr of 0.8 mg/dL). Liver Function Tests: Recent Labs  Lab 05/09/20 1148 05/10/20 0415  AST 83* 57*  ALT 151* 123*  ALKPHOS 543* 503*  BILITOT 0.9 1.2  PROT 6.2* 5.7*  ALBUMIN 3.1* 2.8*   No results for input(s): LIPASE, AMYLASE in the last 168  hours. Recent Labs  Lab 05/10/20 0415  AMMONIA 27   Coagulation Profile: Recent Labs  Lab 05/09/20 1148  INR 0.9   Cardiac Enzymes: No results for input(s): CKTOTAL, CKMB, CKMBINDEX, TROPONINI in the last 168 hours. BNP (last 3 results) No results for input(s): PROBNP in the last 8760 hours. HbA1C: No results for input(s): HGBA1C in the last 72 hours. CBG: Recent Labs  Lab 05/09/20 1146  GLUCAP 93   Lipid Profile: No results for input(s): CHOL, HDL, LDLCALC, TRIG, CHOLHDL, LDLDIRECT in the last 72 hours. Thyroid Function Tests: No results for input(s): TSH, T4TOTAL, FREET4, T3FREE, THYROIDAB in the last 72 hours. Anemia Panel: No results for input(s): VITAMINB12, FOLATE, FERRITIN, TIBC, IRON, RETICCTPCT in the last 72 hours. Sepsis Labs: No results for input(s): PROCALCITON, LATICACIDVEN in the last 168 hours.  Recent Results (from the past 240 hour(s))  Resp Panel by RT-PCR (Flu A&B, Covid) Nasopharyngeal Swab  Status: None   Collection Time: 05/09/20 11:49 AM   Specimen: Nasopharyngeal Swab; Nasopharyngeal(NP) swabs in vial transport medium  Result Value Ref Range Status   SARS Coronavirus 2 by RT PCR NEGATIVE NEGATIVE Final    Comment: (NOTE) SARS-CoV-2 target nucleic acids are NOT DETECTED.  The SARS-CoV-2 RNA is generally detectable in upper respiratory specimens during the acute phase of infection. The lowest concentration of SARS-CoV-2 viral copies this assay can detect is 138 copies/mL. A negative result does not preclude SARS-Cov-2 infection and should not be used as the sole basis for treatment or other patient management decisions. A negative result may occur with  improper specimen collection/handling, submission of specimen other than nasopharyngeal swab, presence of viral mutation(s) within the areas targeted by this assay, and inadequate number of viral copies(<138 copies/mL). A negative result must be combined with clinical observations, patient  history, and epidemiological information. The expected result is Negative.  Fact Sheet for Patients:  EntrepreneurPulse.com.au  Fact Sheet for Healthcare Providers:  IncredibleEmployment.be  This test is no t yet approved or cleared by the [place] FDA and  has been authorized for detection and/or diagnosis of SARS-CoV-2 by FDA under an Emergency Use Authorization (EUA). This EUA will remain  in effect (meaning this test can be used) for the duration of the COVID-19 declaration under Section 564(b)(1) of the Act, 21 U.S.C.section 360bbb-3(b)(1), unless the authorization is terminated  or revoked sooner.       Influenza A by PCR NEGATIVE NEGATIVE Final   Influenza B by PCR NEGATIVE NEGATIVE Final    Comment: (NOTE) The Xpert Xpress SARS-CoV-2/FLU/RSV plus assay is intended as an aid in the diagnosis of influenza from Nasopharyngeal swab specimens and should not be used as a sole basis for treatment. Nasal washings and aspirates are unacceptable for Xpert Xpress SARS-CoV-2/FLU/RSV testing.  Fact Sheet for Patients: EntrepreneurPulse.com.au  Fact Sheet for Healthcare Providers: IncredibleEmployment.be  This test is not yet approved or cleared by the [place] FDA and has been authorized for detection and/or diagnosis of SARS-CoV-2 by FDA under an Emergency Use Authorization (EUA). This EUA will remain in effect (meaning this test can be used) for the duration of the COVID-19 declaration under Section 564(b)(1) of the Act, 21 U.S.C. section 360bbb-3(b)(1), unless the authorization is terminated or revoked.  Performed at [facility], [place] [address] [place], [place] [address]          Radiology Studies: No results found.      Scheduled Meds: . acetaminophen  1,000 mg Oral BID  . aspirin EC  81 mg Oral Daily  . cetirizine  10 mg Oral QHS  . enoxaparin (LOVENOX) injection  40  mg Subcutaneous Q24H  . lisinopril  10 mg Oral Daily  . melatonin  3 mg Oral QHS  . OLANZapine zydis  5 mg Oral QHS  . sodium chloride flush  3 mL Intravenous Once  . sodium chloride flush  3 mL Intravenous Q12H  . vitamin B-12  1,000 mcg Oral Daily   Continuous Infusions: . sodium chloride       LOS: 0 days    Time spent: 30 minutes    Barb Merino, MD [facility] Pager [phone]

## 2020-05-13 NOTE — TOC Progression Note (Addendum)
Transition of Care [facility]) - Progression Note    Patient Details  Name: AKAI DOLLARD MRN: [id number] Date of Birth: [birth year]  Transition of Care [facility]) CM/SW Contact  Aldyn Toon, Edson Snowball, RN Phone Number: 05/13/2020, 10:37 AM  Clinical Narrative:     Spoke to daughter [name]. SW continuing to search for SNF bed offers. [name] wanted to know if palliative team would see her dad today. NCM called Elmo Putt NP with PC. She has a family meeting with another family at 14. She will come to see Elenore Rota after that between 1200 and 1300. Lisa aware. [name] considering taking her dad home with hospice and arranging caregivers. NCM provided medicare.gov list of hospice agencies. NCM wrote NCM name and direct phone number on medicare.gov list.   Will continue to follow.   [place] message from [facility] NP with palliative. Family wanting to check into two more SNF's. If those SNF unable to offer a bed , second plan would take Tyler home , they would arrange caregivers, with palliative and home health. They would need hospital bed and wheel chair.     NCM spoke to wife Stanton Kidney at bedside. And discussed above. NCM provided [name] with hospice list this am. For Palliative choices would be [facility] , or [place]. NCM provided medicare.gov list of home health agencies. If patient goes home at discharge NCM will arrange hospital bed and wheel chair. Stanton Kidney will give home health list to [place] . Will continue to follow.    1630 Received a call from Pattie at [facility] , one of their admission nurses met with patient at bedside and [name] this morning to do an assessment. Concerned patient was in vest and mitten restraints. NCM secured chatted nurse , restraints were applied by night shift last night and have been removed. Pattie aware and will call back with a determination [phone] (cell)  Expected Discharge Plan: [place] Barriers to Discharge: SNF Pending bed offer  Expected Discharge Plan and Services Expected Discharge Plan: [place] arrangements for the past 2 months: Assisted Living Facility                                       Social Determinants of Health (SDOH) Interventions    Readmission Risk Interventions Readmission Risk Prevention Plan 11/25/2019  Medication Screening Complete  Transportation Screening Complete  Some recent data might be hidden

## 2020-05-14 DIAGNOSIS — Z515 Encounter for palliative care: Secondary | ICD-10-CM | POA: Diagnosis not present

## 2020-05-14 DIAGNOSIS — G4709 Other insomnia: Secondary | ICD-10-CM | POA: Diagnosis not present

## 2020-05-14 DIAGNOSIS — R296 Repeated falls: Secondary | ICD-10-CM | POA: Diagnosis not present

## 2020-05-14 DIAGNOSIS — F0151 Vascular dementia with behavioral disturbance: Secondary | ICD-10-CM | POA: Diagnosis not present

## 2020-05-14 DIAGNOSIS — Z7189 Other specified counseling: Secondary | ICD-10-CM | POA: Diagnosis not present

## 2020-05-14 DIAGNOSIS — K7689 Other specified diseases of liver: Secondary | ICD-10-CM | POA: Diagnosis not present

## 2020-05-14 NOTE — TOC Progression Note (Signed)
Transition of Care [facility]) - Progression Note    Patient Details  Name: Jesus Harmon MRN: [id number] Date of Birth: 24-Feb-1929  Transition of Care [facility]) CM/SW Contact  Shabrea Weldin, Edson Snowball, RN Phone Number: 05/14/2020, 10:36 AM  Clinical Narrative:     Carmine Savoy with [facility]. They are still reviewing clinicals. Pattie asked if patient still has mittens on. Per Sarala RN patient has mittens on. Per Pattie mittens are restraints and need to be off at least 24 hours prior to discharge.   Nurse aware. MD wants mittens discontinued.  Expected Discharge Plan: [place] Barriers to Discharge: SNF Pending bed offer  Expected Discharge Plan and Services Expected Discharge Plan: [place] arrangements for the past 2 months: Assisted Living Facility                                       Social Determinants of Health (SDOH) Interventions    Readmission Risk Interventions Readmission Risk Prevention Plan 11/25/2019  Medication Screening Complete  Transportation Screening Complete  Some recent data might be hidden

## 2020-05-14 NOTE — Progress Notes (Signed)
Physical Therapy Treatment Patient Details Name: Jesus Harmon MRN: [id number] DOB: March 30, 1929 Today's Date: 05/14/2020    History of Present Illness Pt is an 87 y.o. male admitted 05/09/20 AMS, multiple falls, slurred speech, PMH includes progressive vascular dementia, recent fall with sacral fracture (05/01/20), THR in November '21, h/ sinus bradycardia, HTN.    PT Comments    Returned for second session today to assist pt back to bed, and provide gentle soft tissue mobilization and stretching to address his neck stiffness (family attributes to arthritis) and tendency to stay in L laterally flexed and rotated position; Ended session with bed in semi-chair position, and neck positioning moderately improved; Pt's daughter, Hassan Rowan reported pt pulled very little on IS -- I encouraged her to keep working on it with her dad.  Follow Up Recommendations  SNF;Supervision/Assistance - 24 hour (SNF for acute rehab is still appropriate for pt, and perhaps if we need ot go the long-term care route, Restorative Nursing will be beneficial; noted also family is considering Home with lots of caregiving support -- in that case, HHPT/OT are reasonable as well)     Equipment Recommendations  Wheelchair (measurements PT);Wheelchair cushion (measurements PT);Hospital bed    Recommendations for Other Services       Precautions / Restrictions Precautions Precautions: Fall Restrictions Weight Bearing Restrictions: No    Mobility  Bed Mobility Overal bed mobility: Needs Assistance Bed Mobility: Sit to Supine     Supine to sit: Max assist;+2 for physical assistance;+2 for safety/equipment Sit to supine: Max assist   General bed mobility comments: assist with all aspects    Transfers Overall transfer level: Needs assistance Equipment used: 2 person hand held assist Transfers: Sit to/from Stand;Stand Pivot Transfers Sit to Stand: Mod assist;+2 physical assistance;+2 safety/equipment Stand pivot  transfers: Max assist;+2 safety/equipment;+2 physical assistance       General transfer comment: 2 person assist to pivot back into bed  Ambulation/Gait                 Stairs             Wheelchair Mobility    Modified Rankin (Stroke Patients Only)       Balance Overall balance assessment: Needs assistance Sitting-balance support: Bilateral upper extremity supported;Feet supported Sitting balance-Leahy Scale: Poor (approaching Fair) Sitting balance - Comments: Pt with strong posterior lean sitting EOB, required max A to maintain balance Postural control: Posterior lean;Left lateral lean Standing balance support: Bilateral upper extremity supported Standing balance-Leahy Scale: Poor Standing balance comment: mod to max A for standing balance                            Cognition Arousal/Alertness: Awake/alert Behavior During Therapy: Flat affect Overall Cognitive Status: Impaired/Different from baseline Area of Impairment: Orientation;Attention;Following commands;Safety/judgement;Awareness;Memory                 Orientation Level: Disoriented to;Place;Time;Situation Current Attention Level: Sustained Memory: Decreased short-term memory Following Commands: Follows one step commands inconsistently;Follows one step commands with increased time Safety/Judgement: Decreased awareness of safety;Decreased awareness of deficits Awareness: Intellectual   General Comments: Pt varied between resistive and cooperative, able to re-directed      Exercises Other Exercises Other Exercises: educated in incentive spirometer    General Comments General comments (skin integrity, edema, etc.): Once in bed, employed heated blankets for relaxation; Performed gentle soft tissue work and stretching to bil neck musculature, with some noted improvement in Neck  ROM from tending to be in L lateral positioning more towards center, and more ability to rotate neck to  the L      Pertinent Vitals/Pain Pain Assessment: Faces Faces Pain Scale: Hurts a little bit Pain Location: Pt's neck and tight neck muscles Pain Descriptors / Indicators: Grimacing;Sore Pain Intervention(s): Repositioned;Heat applied    Home Living                      Prior Function            PT Goals (current goals can now be found in the care plan section) Acute Rehab PT Goals Patient Stated Goal: "I wnat to get in the bed" PT Goal Formulation: With patient/family Time For Goal Achievement: 05/27/20 Potential to Achieve Goals: Good Progress towards PT goals: Progressing toward goals (slowly)    Frequency    Min 3X/week      PT Plan Current plan remains appropriate    Co-evaluation PT/OT/SLP Co-Evaluation/Treatment: Yes Reason for Co-Treatment: Necessary to address cognition/behavior during functional activity PT goals addressed during session: Mobility/safety with mobility OT goals addressed during session: ADL's and self-care;Strengthening/ROM      AM-PAC PT "6 Clicks" Mobility   Outcome Measure  Help needed turning from your back to your side while in a flat bed without using bedrails?: A Lot Help needed moving from lying on your back to sitting on the side of a flat bed without using bedrails?: A Lot Help needed moving to and from a bed to a chair (including a wheelchair)?: A Lot Help needed standing up from a chair using your arms (e.g., wheelchair or bedside chair)?: A Lot Help needed to walk in hospital room?: Total Help needed climbing 3-5 steps with a railing? : Total 6 Click Score: 10    End of Session Equipment Utilized During Treatment: Gait belt Activity Tolerance: Patient tolerated treatment well Patient left: in bed;with call bell/phone within reach;with bed alarm set Nurse Communication: Mobility status PT Visit Diagnosis: Other abnormalities of gait and mobility (R26.89);Muscle weakness (generalized) (M62.81);Difficulty in  walking, not elsewhere classified (R26.2);Repeated falls (R29.6);Unsteadiness on feet (R26.81)     Time: 9983-3825 PT Time Calculation (min) (ACUTE ONLY): 20 min  Charges:  $Therapeutic Activity: 8-22 mins                     Roney Marion, PT  Acute Rehabilitation Services Pager [phone] Office [place] 05/14/2020, 2:29 PM

## 2020-05-14 NOTE — TOC Progression Note (Signed)
Transition of Care [facility]) - Progression Note    Patient Details  Name: DANZEL MARSZALEK MRN: [id number] Date of Birth: [birth year]  [facility]) CM/SW [place], [place] Phone Number: 05/14/2020, 4:41 PM  Clinical Narrative:     CSW and NCM spoke to Pts wife and two daughters [name] and Hassan Rowan at bedside. Daughters where upset about SNF options and their ratings. Family unsure if they want to take him home, send him to SNF or to ALF. Family waiting to hear back from [facility] and and [facility].   TOC will follow  Expected Discharge Plan: [place] Barriers to Discharge: SNF Pending bed offer  Expected Discharge Plan and Services Expected Discharge Plan: [place] arrangements for the past 2 months: [place]                                       Social Determinants of Health (SDOH) Interventions    Readmission Risk Interventions Readmission Risk Prevention Plan 11/25/2019  Medication Screening Complete  Transportation Screening Complete  Some recent data might be hidden   Emeterio Reeve, Latanya Presser, [place] Social Worker [phone]

## 2020-05-14 NOTE — Progress Notes (Signed)
Per CM/SW, patient's family has some issues regarding a restraints and patient's nursing care and request to speak to CN. This CN provide families time to voice their concerns and complaints. RN reviewed charts, orders, and notes and did not see any restrains orders or notes that patient was in restraints from nursing staff, but patient did briefly had mittens on upon admission due to his declining cognitive function and possible unfamiliarize environment. RN reviewed these findings with pt's daughters and spouse. Per [name] (daughter at bedside), she does not have any issue with nursing care, but request to speak to CM for some confusion regarding patient's disposition. CM notified.

## 2020-05-14 NOTE — Progress Notes (Signed)
Palliative:  HPI:87 y.o.malewith past medical history of vascular dementia, CAD, diastolic heart failure, HTN, HLD,recent fall with sacral fracture 05/01/20, fall with left hip fracture s/p total hip 11/23/19,liver nodule (undergoing work up with Dr. Watt Climes), kidney stones, bladder canceradmitted on 4/29/2022with multiple falls and altered mental status.MRI shows no evidence of stroke but advanced vascular disease.  I met today at [name] bedside with wife and 2 daughters. They continue to struggle with placement and options to move forward. Wife is clear that her ideal arrangement would be placement but only if this is a facility they would feel comfortable that he would get good care. [name] is more awake today but if fidgety with blanket. He has NOT during my visits or from family or nursing reports made any attempts to get out of bed by himself. He has made attempts (mainly in early evening times) to remove telemetry which is why it was reported to utilize the mittens. I took the mittens off during my visit yesterday midday and the mittens are lying in the same place I left them. RN and family report they did not remove mittens so I do believe they have remained off. PT leaving and report that he is able to stand and has gait pattern with potential for ability to walk again.   Family are becoming frustrated with communication regarding placement and options. [name] is actively calling and pursuing placement herself. They will take him home with private caregiver support but only once [place], [place], and [place] denies them placement. They are hopeful to hear back from facilities today.   All questions/concerns addressed. Emotional support provided.   Exam: Alert, awake. Confused. Talking today but often mumbles. Fidgets with sheets but resting comfortably in bed. No distress. Breathing regular, unlabored. Moves all extremities.   Plan: - Still awaiting potential  placement vs home with private caregiver support.   22 min  Vinie Sill, NP Palliative Medicine Team Pager [phone] (Please see [email or web address] for schedule) Team Phone [phone]    Greater than 50%  of this time was spent counseling and coordinating care related to the above assessment and plan

## 2020-05-14 NOTE — Progress Notes (Signed)
Physical Therapy Treatment Patient Details Name: Jesus Harmon MRN: [id number] DOB: [birth year] Today's Date: 05/14/2020    History of Present Illness Pt is an 87 y.o. male admitted 05/09/20 AMS, multiple falls, slurred speech, PMH includes progressive vascular dementia, recent fall with sacral fracture (05/01/20), THR in November '21, h/ sinus bradycardia, HTN.    PT Comments    Continuing work on functional mobility and activity tolerance;  Session focused on functional transfers, and we were able to garner better participation working towards one of his stated goals -- to eat lunch; Max assist for transitioning supine to sit (he originally di not want to get up, but participated more after coming to sit; Posterior lean in sitting; 2 person assist for standing and pivoting to recliner; notable LE muscle activation and attempt to stay standing in front of the chair;   It is noteworthy that [name] was able to walk (albeit short distance in hospital room) 4 days ago, and within the past few weeks he was able to fix himself a simple breakfast at home; He still has a gait pattern in him that we can work to recover; SNF for acute rehab is still appropriate for pt, and perhaps if we need ot go the long-term care route, Restorative Nursing will be beneficial; noted also family is considering Home with lots of caregiving support -- in that case, HHPT/OT are reasonable as well  Follow Up Recommendations  SNF;Supervision/Assistance - 24 hour (See discussion above)     Equipment Recommendations  Wheelchair (measurements PT);Wheelchair cushion (measurements PT);Hospital bed (possibly mechanical lift)    Recommendations for Other Services       Precautions / Restrictions Precautions Precautions: Fall Restrictions Weight Bearing Restrictions: No    Mobility  Bed Mobility Overal bed mobility: Needs Assistance Bed Mobility: Supine to Sit     Supine to sit: Max assist;+2 for physical  assistance;+2 for safety/equipment     General bed mobility comments: Assist with all aspects. Pt with posterior lean coming to sitting    Transfers Overall transfer level: Needs assistance Equipment used: 2 person hand held assist Transfers: Sit to/from [facility] Sit to Stand: Mod assist;+2 physical assistance;+2 safety/equipment Stand pivot transfers: Max assist;+2 safety/equipment;+2 physical assistance       General transfer comment: Once therapy helped initiate stand bringing hips up and fwd, pt able to assist with stand and maintain for approx 30 seconds  Ambulation/Gait                 Stairs             Wheelchair Mobility    Modified Rankin (Stroke Patients Only)       Balance Overall balance assessment: Needs assistance Sitting-balance support: Bilateral upper extremity supported;Feet supported Sitting balance-Leahy Scale: Poor Sitting balance - Comments: Pt with strong posterior lean sitting EOB, required max A to maintain balance Postural control: Posterior lean;Left lateral lean Standing balance support: Bilateral upper extremity supported Standing balance-Leahy Scale: Poor Standing balance comment: mod to max A for standing balance                            Cognition Arousal/Alertness: Awake/alert Behavior During Therapy: Flat affect Overall Cognitive Status: Impaired/Different from baseline Area of Impairment: Orientation;Attention;Following commands;Safety/judgement;Awareness;Memory                 Orientation Level: Disoriented to;Place;Time;Situation Current Attention Level: Sustained Memory: Decreased short-term memory Following Commands: Follows  one step commands inconsistently;Follows one step commands with increased time Safety/Judgement: Decreased awareness of safety;Decreased awareness of deficits Awareness: Intellectual   General Comments: Pt varied between resistive and cooperative, able  to re-directed      Exercises Other Exercises Other Exercises: educated in incentive spirometer    General Comments General comments (skin integrity, edema, etc.): Daughter present and wife present today, Pt enjoys eating (all kinds of food and any meal!) Pt with noticeable cough. Provided with incentive spirometer and educated daughter on how to use. Pt did not use today yet as he was engaged in eating      Pertinent Vitals/Pain Pain Assessment: No/denies pain Pain Intervention(s): Monitored during session    Home Living                      Prior Function            PT Goals (current goals can now be found in the care plan section) Acute Rehab PT Goals Patient Stated Goal: get something to eat PT Goal Formulation: With patient/family Time For Goal Achievement: 05/27/20 Potential to Achieve Goals: Good Progress towards PT goals: Progressing toward goals (very slowly)    Frequency    Min 3X/week      PT Plan Current plan remains appropriate;Frequency needs to be updated    Co-evaluation PT/OT/SLP Co-Evaluation/Treatment: Yes Reason for Co-Treatment: Necessary to address cognition/behavior during functional activity PT goals addressed during session: Mobility/safety with mobility OT goals addressed during session: ADL's and self-care;Strengthening/ROM      AM-PAC PT "6 Clicks" Mobility   Outcome Measure  Help needed turning from your back to your side while in a flat bed without using bedrails?: A Lot Help needed moving from lying on your back to sitting on the side of a flat bed without using bedrails?: A Lot Help needed moving to and from a bed to a chair (including a wheelchair)?: A Lot Help needed standing up from a chair using your arms (e.g., wheelchair or bedside chair)?: A Lot Help needed to walk in hospital room?: Total Help needed climbing 3-5 steps with a railing? : Total 6 Click Score: 10    End of Session Equipment Utilized During  Treatment: Gait belt Activity Tolerance: Patient tolerated treatment well Patient left: in chair;with call bell/phone within reach;with chair alarm set;with family/visitor present (eating lunch) Nurse Communication: Mobility status PT Visit Diagnosis: Other abnormalities of gait and mobility (R26.89);Muscle weakness (generalized) (M62.81);Difficulty in walking, not elsewhere classified (R26.2);Repeated falls (R29.6);Unsteadiness on feet (R26.81)     Time: 3810-1751 PT Time Calculation (min) (ACUTE ONLY): 38 min  Charges:  $Therapeutic Activity: 8-22 mins                     Roney Marion, PT  Acute Rehabilitation Services Pager [phone] Office [place] 05/14/2020, 1:40 PM

## 2020-05-14 NOTE — Progress Notes (Addendum)
PROGRESS NOTE    Jesus Harmon  ERX:[id number] DOB: 11-Sep-1929 DOA: 05/09/2020 PCP: Alroy Dust, L.Marlou Sa, MD    Brief Narrative:  87 year old gentleman with history of progressive vascular dementia, recent fall and sacral fracture, total hip replacement in 2021, sinus bradycardia, hypertension and currently with increasing difficulty to manage symptoms at home brought from home with multiple falls, also found to have slurred speech, confusion and not recognizing his wife.     Assessment & Plan:   Active Problems:   Hyperlipidemia   Essential hypertension   Vascular dementia with behavior disturbance (HCC)   Liver nodule   Falls frequently   Multiple falls   Pressure injury of skin   Multiple falls/progressive debility and dementia/acute encephalopathy in the setting of underlying advanced vascular dementia: MRI of the brain with severe chronic microvascular disease.  MRA of the head and neck was essentially normal.  No evidence of stroke.  TIA and stroke ruled out. Urinalysis normal.  No evidence of acute infection.   EEG was done, less likely a seizure.  No indication for treatment. Patient with progressive dementia, his symptoms are likely related to advanced Alzheimer's dementia. He was seen by neurology in the office, could not tolerate therapeutics. - Zyprexa 5 mg and melatonin 3 mg.   Hypertension:  -Stable.  Home medications.  On lisinopril.  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  MRI was not diagnostic.  Per Dr. Ivor Reining: Does not look like metastatic cancer, however possible.Given patient's advanced frailty and deteriorating mental status, it is wise to prevent sedation and anesthesia.  I discussed this with patient's family that knowing the diagnosis will not help any treatment as he is not able to tolerate any treatment/chemotherapy or surgery at this stage of life.   If in case his mobility and mental status improves, he may benefit with diagnostic biopsy.  Family agreed.   Code Status: DNR Family Communication: Daughter [name] at the bedside. Disposition Plan: Status is: Observation   Dispo: The patient is from: Home              Anticipated d/c is to: SNF         Consultants:   Palliative medicine   Subjective: Asking for breakfast this AM   Objective: Vitals:   05/13/20 1653 05/14/20 0001 05/14/20 0541 05/14/20 1144  BP: (!) 145/63 129/61 140/63 (!) 164/93  Pulse: 75 63 63 (!) 101  Resp: 18 18 18 19   Temp: 98.7 F (37.1 C) 98.4 F (36.9 C) 98.4 F (36.9 C) 97.6 F (36.4 C)  TempSrc: Oral Oral Oral Oral  SpO2: 98% 99% 99% 97%  Weight:        Intake/Output Summary (Last 24 hours) at 05/14/2020 1233 Last data filed at 05/14/2020 0600 Gross per 24 hour  Intake 360 ml  Output 500 ml  Net -140 ml   Filed Weights   05/09/20 1100  Weight: 61.2 kg    Examination:   General: Appearance:    Thin male in no acute distress     Lungs:      respirations unlabored  Heart:    Tachycardic. Normal rhythm. No murmurs, rubs, or gallops.   MS:   All extremities are intact.   Neurologic:  pleasant and cooperative    Data Reviewed: I have personally reviewed following labs and imaging studies  CBC: Recent Labs  Lab 05/09/20 1148 05/09/20 1151  WBC 7.6  --  NEUTROABS 5.6  --   HGB 10.8* 11.6*  HCT 34.2* 34.0*  MCV 93.2  --   PLT 355  --    Basic Metabolic Panel: Recent Labs  Lab 05/09/20 1148 05/09/20 1151  NA 138 140  K 3.9 3.9  CL 105 105  CO2 27  --   GLUCOSE 107* 102*  BUN 17 19  CREATININE 0.79 0.80  CALCIUM 9.0  --    GFR: Estimated Creatinine Clearance: 55.3 mL/min (by C-G formula based on SCr of 0.8 mg/dL). Liver Function Tests: Recent Labs  Lab 05/09/20 1148 05/10/20 0415  AST 83* 57*  ALT 151* 123*  ALKPHOS 543* 503*  BILITOT 0.9 1.2  PROT 6.2* 5.7*  ALBUMIN 3.1* 2.8*   No results for input(s):  LIPASE, AMYLASE in the last 168 hours. Recent Labs  Lab 05/10/20 0415  AMMONIA 27   Coagulation Profile: Recent Labs  Lab 05/09/20 1148  INR 0.9   Cardiac Enzymes: No results for input(s): CKTOTAL, CKMB, CKMBINDEX, TROPONINI in the last 168 hours. BNP (last 3 results) No results for input(s): PROBNP in the last 8760 hours. HbA1C: No results for input(s): HGBA1C in the last 72 hours. CBG: Recent Labs  Lab 05/09/20 1146  GLUCAP 93   Lipid Profile: No results for input(s): CHOL, HDL, LDLCALC, TRIG, CHOLHDL, LDLDIRECT in the last 72 hours. Thyroid Function Tests: No results for input(s): TSH, T4TOTAL, FREET4, T3FREE, THYROIDAB in the last 72 hours. Anemia Panel: No results for input(s): VITAMINB12, FOLATE, FERRITIN, TIBC, IRON, RETICCTPCT in the last 72 hours. Sepsis Labs: No results for input(s): PROCALCITON, LATICACIDVEN in the last 168 hours.  Recent Results (from the past 240 hour(s))  Resp Panel by RT-PCR (Flu A&B, Covid) Nasopharyngeal Swab     Status: None   Collection Time: 05/09/20 11:49 AM   Specimen: Nasopharyngeal Swab; Nasopharyngeal(NP) swabs in vial transport medium  Result Value Ref Range Status   SARS Coronavirus 2 by RT PCR NEGATIVE NEGATIVE Final    Comment: (NOTE) SARS-CoV-2 target nucleic acids are NOT DETECTED.  The SARS-CoV-2 RNA is generally detectable in upper respiratory specimens during the acute phase of infection. The lowest concentration of SARS-CoV-2 viral copies this assay can detect is 138 copies/mL. A negative result does not preclude SARS-Cov-2 infection and should not be used as the sole basis for treatment or other patient management decisions. A negative result may occur with  improper specimen collection/handling, submission of specimen other than nasopharyngeal swab, presence of viral mutation(s) within the areas targeted by this assay, and inadequate number of viral copies(<138 copies/mL). A negative result must be combined  with clinical observations, patient history, and epidemiological information. The expected result is Negative.  Fact Sheet for Patients:  EntrepreneurPulse.com.au  Fact Sheet for Healthcare Providers:  IncredibleEmployment.be  This test is no t yet approved or cleared by the [place] FDA and  has been authorized for detection and/or diagnosis of SARS-CoV-2 by FDA under an Emergency Use Authorization (EUA). This EUA will remain  in effect (meaning this test can be used) for the duration of the COVID-19 declaration under Section 564(b)(1) of the Act, 21 U.S.C.section 360bbb-3(b)(1), unless the authorization is terminated  or revoked sooner.       Influenza A by PCR NEGATIVE NEGATIVE Final   Influenza B by PCR NEGATIVE NEGATIVE Final    Comment: (NOTE) The Xpert Xpress SARS-CoV-2/FLU/RSV plus assay is intended as an aid in the diagnosis of influenza from Nasopharyngeal swab specimens and should not be used  as a sole basis for treatment. Nasal washings and aspirates are unacceptable for Xpert Xpress SARS-CoV-2/FLU/RSV testing.  Fact Sheet for Patients: EntrepreneurPulse.com.au  Fact Sheet for Healthcare Providers: IncredibleEmployment.be  This test is not yet approved or cleared by the [place] FDA and has been authorized for detection and/or diagnosis of SARS-CoV-2 by FDA under an Emergency Use Authorization (EUA). This EUA will remain in effect (meaning this test can be used) for the duration of the COVID-19 declaration under Section 564(b)(1) of the Act, 21 U.S.C. section 360bbb-3(b)(1), unless the authorization is terminated or revoked.  Performed at [facility], [place] [address], [place], [place] [address]          Radiology Studies: No results found.      Scheduled Meds: . acetaminophen  1,000 mg Oral BID  . aspirin EC  81 mg Oral Daily  . cetirizine  10 mg Oral QHS  .  enoxaparin (LOVENOX) injection  40 mg Subcutaneous Q24H  . lisinopril  10 mg Oral Daily  . melatonin  3 mg Oral QHS  . OLANZapine zydis  5 mg Oral QHS  . sodium chloride flush  3 mL Intravenous Once  . sodium chloride flush  3 mL Intravenous Q12H  . vitamin B-12  1,000 mcg Oral Daily   Continuous Infusions: . sodium chloride       LOS: 0 days    Time spent: 30 minutes    Geradine Girt, DO

## 2020-05-14 NOTE — Care Management (Addendum)
Spoke to [place] on the phone. [name] was in her father's room yesterday when the nurse from [facility] was there. Her father did not have a posey bely on. Pattie from [facility] called yesterday ( please see note), and asked if patient had posey belt and mittens on . NCM asked nurse Prentiss Bells ) at that time and was told night shift had placed them but they had been removed.   Per [name] patient had activity apron on not posey belt.   Also this morning when Pattie called she asked if patient had mittens on today. NCM asked patient's nurse ( Sarala)and was told yes but she would remove them. Nurse now reporting "just few minutes ".    NCM called Pattie at [facility] and explained above. Pattie reports she spoke to [place] NP yesterday and was told patient had mittens on in morning.    Pattie still reviewing.   [name] wants to take her father home tomorrow with [facility]. Tommi Rumps with [facility] accepted referral for HHRN,PT,OT,SW and aide.    [name] is the contact person for delivery. NCM called [place] they will call [name] in the morning for delivery. NCM confirned address and number.    NCM secure chatted Dr Eliseo Squires for orders for [facility] and SW and face to face  Magdalen Spatz RN

## 2020-05-14 NOTE — Care Management (Signed)
Spoke to Lyman at [facility] phone [phone] fax [phone]. She requested H and P and Pt and OT notes be faxed to her. Same done. Charity has NCM direct contact information.   Magdalen Spatz RN

## 2020-05-14 NOTE — Progress Notes (Signed)
Occupational Therapy Treatment Patient Details Name: Jesus Harmon MRN: [id number] DOB: 1929/07/24 Today's Date: 05/14/2020    History of present illness Pt is an 87 y.o. male admitted 05/09/20 AMS, multiple falls, slurred speech, PMH includes progressive vascular dementia, recent fall with sacral fracture (05/01/20), THR in November '21, h/ sinus bradycardia, HTN.   OT comments  Pt initially resistant to therapy, but once Pt upright eager to cooperate - get to chair and self-feed. Max A for all aspects of bed mobility (+2 safety), +2 face to face max A for transfer to recliner (Pt did respond well to music during session) and with set up and min A able to self-feed spaghetti. He was mod A for seated grooming tasks (washing hands prior to eating with wipe). Pt with improved cognition since last seen by OT. Potential for Pt to progress with rehab is good especially as Pt was walking upon evaluation. OT will continue to follow acutely.  Follow Up Recommendations  SNF    Equipment Recommendations  Wheelchair (measurements OT);Wheelchair cushion (measurements OT);Hospital bed (if he goes home with family support)    Recommendations for Other Services      Precautions / Restrictions Precautions Precautions: Fall Restrictions Weight Bearing Restrictions: No       Mobility Bed Mobility Overal bed mobility: Needs Assistance Bed Mobility: Supine to Sit     Supine to sit: Max assist;+2 for physical assistance;+2 for safety/equipment     General bed mobility comments: Assist with all aspects. Pt with posterior lean coming to sitting    Transfers Overall transfer level: Needs assistance Equipment used: 2 person hand held assist Transfers: Sit to/from [facility] Sit to Stand: Mod assist;+2 physical assistance;+2 safety/equipment Stand pivot transfers: Max assist;+2 safety/equipment;+2 physical assistance       General transfer comment: Once therapy helped initiate  stand bringing hips up and fwd, pt able to assist with stand and maintain for approx 30 seconds    Balance Overall balance assessment: Needs assistance Sitting-balance support: Bilateral upper extremity supported;Feet supported Sitting balance-Leahy Scale: Poor Sitting balance - Comments: Pt with strong posterior lean sitting EOB, required max A to maintain balance Postural control: Posterior lean;Left lateral lean Standing balance support: Bilateral upper extremity supported Standing balance-Leahy Scale: Poor Standing balance comment: mod to max A for standing balance                           ADL either performed or assessed with clinical judgement   ADL Overall ADL's : Needs assistance/impaired Eating/Feeding: Minimal assistance;Sitting Eating/Feeding Details (indicate cue type and reason): cut up spaghetti for pt and with positioning able to self-feed (motivated by food) Grooming: Wash/dry hands;Moderate assistance;Sitting                   Toilet Transfer: Maximal assistance;+2 for physical assistance;+2 for safety/equipment;Stand-pivot Toilet Transfer Details (indicate cue type and reason): simulated through bed>recliner         Functional mobility during ADLs: Maximal assistance;+2 for physical assistance;+2 for safety/equipment       Vision       Perception     Praxis      Cognition Arousal/Alertness: Awake/alert Behavior During Therapy: Flat affect Overall Cognitive Status: Impaired/Different from baseline Area of Impairment: Orientation;Attention;Following commands;Safety/judgement;Awareness;Memory                 Orientation Level: Disoriented to;Place;Time;Situation Current Attention Level: Sustained Memory: Decreased short-term memory Following Commands: Follows one  step commands inconsistently;Follows one step commands with increased time Safety/Judgement: Decreased awareness of safety;Decreased awareness of deficits Awareness:  Intellectual   General Comments: Pt varied between resistive and cooperative, able to re-directed        Exercises Exercises: Other exercises Other Exercises Other Exercises: educated in incentive spirometer   Shoulder Instructions       General Comments Daughter present and wife present today, Pt enjoys eating (all kinds of food and any meal!) Pt with noticeable cough. Provided with incentive spirometer and educated daughter on how to use. Pt did not use today yet as he was engaged in eating    Pertinent Vitals/ Pain       Pain Assessment: No/denies pain Pain Intervention(s): Monitored during session;Repositioned  Home Living                                          Prior Functioning/Environment              Frequency  Min 2X/week        Progress Toward Goals  OT Goals(current goals can now be found in the care plan section)  Progress towards OT goals: Progressing toward goals  Acute Rehab OT Goals Patient Stated Goal: get something to eat OT Goal Formulation: With family Time For Goal Achievement: 05/24/20 Potential to Achieve Goals: [place] Discharge plan remains appropriate    Co-evaluation    PT/OT/SLP Co-Evaluation/Treatment: Yes Reason for Co-Treatment: Necessary to address cognition/behavior during functional activity;To address functional/ADL transfers;For patient/therapist safety PT goals addressed during session: Mobility/safety with mobility;Balance;Strengthening/ROM OT goals addressed during session: ADL's and self-care;Strengthening/ROM      AM-PAC OT "6 Clicks" Daily Activity     Outcome Measure   Help from another person eating meals?: A Lot Help from another person taking care of personal grooming?: A Lot Help from another person toileting, which includes using toliet, bedpan, or urinal?: A Lot Help from another person bathing (including washing, rinsing, drying)?: A Lot Help from another person to put on and  taking off regular upper body clothing?: A Lot Help from another person to put on and taking off regular lower body clothing?: A Lot 6 Click Score: 12    End of Session Equipment Utilized During Treatment: Gait belt  OT Visit Diagnosis: Unsteadiness on feet (R26.81);Repeated falls (R29.6);Muscle weakness (generalized) (M62.81);History of falling (Z91.81);Other symptoms and signs involving cognitive function   Activity Tolerance Patient tolerated treatment well   Patient Left in chair;with call bell/phone within reach;with chair alarm set;with family/visitor present   Nurse Communication Mobility status        Time: 5852-7782 OT Time Calculation (min): 44 min  Charges: OT General Charges $OT Visit: 1 Visit OT Treatments $Self Care/Home Management : 8-22 mins $Therapeutic Activity: 8-22 mins  Jesse Sans OTR/L Acute Rehabilitation Services Pager: [phone] Office: [place] 05/14/2020, 12:11 PM

## 2020-05-15 ENCOUNTER — Other Ambulatory Visit (HOSPITAL_COMMUNITY): Payer: Self-pay

## 2020-05-15 ENCOUNTER — Other Ambulatory Visit: Payer: Self-pay

## 2020-05-15 DIAGNOSIS — Z749 Problem related to care provider dependency, unspecified: Secondary | ICD-10-CM | POA: Diagnosis not present

## 2020-05-15 DIAGNOSIS — K7689 Other specified diseases of liver: Secondary | ICD-10-CM | POA: Diagnosis not present

## 2020-05-15 DIAGNOSIS — R402411 Glasgow coma scale score 13-15, in the field [EMT or ambulance]: Secondary | ICD-10-CM | POA: Diagnosis not present

## 2020-05-15 DIAGNOSIS — R279 Unspecified lack of coordination: Secondary | ICD-10-CM | POA: Diagnosis not present

## 2020-05-15 LAB — RESP PANEL BY RT-PCR (FLU A&B, COVID) ARPGX2
Influenza A by PCR: NEGATIVE
Influenza B by PCR: NEGATIVE
SARS Coronavirus 2 by RT PCR: NEGATIVE

## 2020-05-15 MED ORDER — OLANZAPINE 5 MG PO TABS
5.0000 mg | ORAL_TABLET | Freq: Every evening | ORAL | 0 refills | Status: AC | PRN
  Filled 2020-05-15: qty 14, 14d supply, fill #0

## 2020-05-15 NOTE — Progress Notes (Signed)
[facility] arrived to transport pt to [facility] facility; D/C paperwork given to transporters, and daughter notified via mobile phone of pt transport status.

## 2020-05-15 NOTE — TOC Transition Note (Signed)
Transition of Care [facility]) - CM/SW Discharge Note   Patient Details  Name: Jesus Harmon MRN: [id number] Date of Birth: 1929-07-15  Transition of [facility]) CM/SW Contact:  Emeterio Reeve, [place] Phone Number: 05/15/2020, 3:17 PM   Clinical Narrative:     Patient will DC to: [facility], [place] Anticipated DC date: 05/15/20 Family notified: [name], daughter Transport by: Corey Harold     Per MD patient ready for DC to [facility]. RN, patient, patient's family, and facility notified of DC. Discharge Summary and FL2 sent to facility. DC packet on chart. Pt is covid negative. Ambulance transport requested for patient.    RN to call report to [phone] ex [phone]  CSW will sign off for now as social work intervention is no longer needed. Please consult [place] again if new needs arise.  Final next level of care: Skilled Nursing Facility Barriers to Discharge: Barriers Resolved   Patient Goals and CMS Choice   CMS Medicare.gov Compare Post Acute Care list provided to:: Patient Represenative (must comment) Choice offered to / list presented to : Adult Children,Spouse  Discharge Placement              Patient chooses bed at: [facility], [place] Patient to be transferred to facility by: [facility] Name of family member notified: [name], daughter Patient and family notified of of transfer: 05/15/20  Discharge Plan and Services                                     Social Determinants of Health ([place]) Interventions     Readmission Risk Interventions Readmission Risk Prevention Plan 11/25/2019  Medication Screening Complete  Transportation Screening Complete  Some recent data might be hidden    Emeterio Reeve, Latanya Presser, [place] Social Worker [phone]

## 2020-05-15 NOTE — TOC Progression Note (Addendum)
Transition of Care [facility]) - Progression Note    Patient Details  Name: GABRIAN HOQUE MRN: [id number] Date of Birth: 02/18/1929  Transition of Care [facility]) CM/SW [place], [place] Phone Number: 05/15/2020, 1:16 PM  Clinical Narrative:     CSW received call from [place] admission coordinator that he was accepted for private pay. Pts daughter [name] is already aware. CSW reached out to [place], she stated that she is unsure because pts wife is worried about him being 45 minutes from home. [name] stated she will call CSW back. Pts covid test results are pending.   Please refer to case management note concerning [facility].   Pt does have a DC order in.   TOC will follow.  Expected Discharge Plan: [place] Barriers to Discharge: SNF Pending bed offer  Expected Discharge Plan and Services Expected Discharge Plan: [place] arrangements for the past 2 months: [place] Expected Discharge Date: 05/15/20                                     Social Determinants of Health (SDOH) Interventions    Readmission Risk Interventions Readmission Risk Prevention Plan 11/25/2019  Medication Screening Complete  Transportation Screening Complete  Some recent data might be hidden   Emeterio Reeve, Latanya Presser, [place] Social Worker [phone]

## 2020-05-15 NOTE — Progress Notes (Signed)
Pt changed into personal clothing. No acute distress noted. Pt placed in a incontinent brief. Family verbalized understanding of discharge instructions. PT in room awaiting PTAR. Medical necessity paperwork  in chart. Yellow DNR form in chart, to be taken to patient's facility.

## 2020-05-15 NOTE — Progress Notes (Signed)
Report called to Bethena Roys RN from [place] [facility].

## 2020-05-15 NOTE — Discharge Summary (Signed)
Physician Discharge Summary  QUAYSHAWN UPHOFF [id number] DOB: 02/06/1929 DOA: 05/09/2020  PCP: Alroy Dust, L.Marlou Sa, MD  Admit date: 05/09/2020 Discharge date: 05/15/2020  Admitted From: home Discharge disposition: SNF   Recommendations for Outpatient Follow-Up:   1. Palliative care to follow at SNF   Discharge Diagnosis:   Active Problems:   Hyperlipidemia   Essential hypertension   Vascular dementia with behavior disturbance (HCC)   Liver nodule   Falls frequently   Multiple falls   Pressure injury of skin    Discharge Condition: Improved.  Diet recommendation:  Regular.  Wound care: None.  Code status: DNR.   History of Present Illness:   Jesus Harmon is a 87 y.o. male with medical history significant of progressive vascular dementia, recent fall with sacral fracture, THR in November '21, h/ sinus bradycardia, HTN. Last seen in ED 05/01/20 after fall and sacra fx seen. Also, elevated LFTs with CT abdomen revealing subcapsular hepatic nodule right lobe. At home he had two additional falls: 0100 hrs and 0800 hrs. He had change in mental status afterwards: slurred speech, confusion and did not recognize his wife. EMS activated and patient transferred to [facility] for evaluation.    Hospital Course by Problem:   Multiple falls/progressive debility and dementia/acute encephalopathy in the setting of underlying advanced vascular dementia: MRI of the brain with severe chronic microvascular disease.  MRA of the head and neck was essentially normal.  No evidence of stroke.  TIA and stroke ruled out. Urinalysis normal.  No evidence of acute infection.   EEG was done, less likely a seizure.  No indication for treatment. Patient with progressive dementia, his symptoms are likely related to advanced Alzheimer's dementia. He was seen by neurology in the office, could not tolerate therapeutics. - Zyprexa 5 mg and melatonin 3 mg.   Hypertension:  -Stable.  Home  medications.  On lisinopril.  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  MRI was not diagnostic.  Per Dr. Ivor Reining: Does not look like metastatic cancer, however possible.Given patient's advanced frailty and deteriorating mental status, it is wise to prevent sedation and anesthesia.  I discussed this with patient's family that knowing the diagnosis will not help any treatment as he is not able to tolerate any treatment/chemotherapy or surgery at this stage of life.  If in case his mobility and mental status improves, he may benefit with diagnostic biopsy.  Family agreed.     Medical Consultants:  palliative care    Discharge Exam:   Vitals:   05/15/20 0115 05/15/20 0548  BP: (!) 143/65 130/78  Pulse: 77 78  Resp: 18 18  Temp: 98.3 F (36.8 C) 98.3 F (36.8 C)  SpO2: 97% 97%   Vitals:   05/14/20 1144 05/14/20 1624 05/15/20 0115 05/15/20 0548  BP: (!) 164/93 (!) 124/58 (!) 143/65 130/78  Pulse: (!) 101 88 77 78  Resp: 19 18 18 18   Temp: 97.6 F (36.4 C) 97.9 F (36.6 C) 98.3 F (36.8 C) 98.3 F (36.8 C)  TempSrc: Oral Oral Oral Oral  SpO2: 97% 96% 97% 97%  Weight:        General exam: Appears calm and comfortable.   The results of significant diagnostics from this hospitalization (including imaging, microbiology, ancillary and laboratory) are listed below for reference.     Procedures and Diagnostic Studies:   DG Pelvis 1-2 Views  Result Date: 05/09/2020  CLINICAL DATA:  The patient suffered multiple falls yesterday. Initial encounter. EXAM: PELVIS - 1-2 VIEW COMPARISON:  Single-view of the pelvis 11/23/2019. FINDINGS: There is no evidence of pelvic fracture or diastasis. No pelvic bone lesions are seen. Left hip replacement is in place. The patient has mild to moderate appearing right hip osteoarthritis. IMPRESSION: No acute abnormality. Electronically Signed   By:  Inge Rise M.D.   On: 05/09/2020 12:55   CT CERVICAL SPINE WO CONTRAST  Result Date: 05/09/2020 CLINICAL DATA:  Fall this morning.  Neck pain.  Initial encounter. EXAM: CT CERVICAL SPINE WITHOUT CONTRAST TECHNIQUE: Multidetector CT imaging of the cervical spine was performed without intravenous contrast. Multiplanar CT image reconstructions were also generated. COMPARISON:  11/22/2019 FINDINGS: Alignment: Normal. Skull base and vertebrae: No acute fracture. No primary bone lesion or focal pathologic process. Soft tissues and spinal canal: No prevertebral fluid or swelling. No visible canal hematoma. Disc levels: Moderate to severe degenerative disc disease is again seen from levels of C4-T2, which is most severe at C5-6. Moderate left-sided facet DJD is again seen at C2-3 and C3-4. these findings show no significant change compared to prior exam. Upper chest: No acute findings. Other: None. IMPRESSION: No evidence of acute cervical spine fracture or subluxation. Stable degenerative spondylosis, as described above. Electronically Signed   By: Marlaine Hind M.D.   On: 05/09/2020 12:26   MR ANGIO HEAD WO CONTRAST  Result Date: 05/09/2020 CLINICAL DATA:  Code stroke presentation.  Altered mental status. EXAM: MRI HEAD WITHOUT CONTRAST MRA HEAD WITHOUT CONTRAST MRA NECK WITHOUT CONTRAST TECHNIQUE: Multiplanar, multiecho pulse sequences of the brain and surrounding structures were obtained without intravenous contrast. Angiographic images of the Circle of Willis were obtained using MRA technique without intravenous contrast. Angiographic images of the neck were obtained using MRA technique without intravenous contrast. Carotid stenosis measurements (when applicable) are obtained utilizing NASCET criteria, using the distal internal carotid diameter as the denominator. COMPARISON:  CT study same day FINDINGS: MRI HEAD FINDINGS Brain: Diffusion imaging does not show any acute or subacute infarction. The  brainstem and cerebellum are normal. Cerebral hemispheres show age related volume loss with extensive chronic small-vessel ischemic change within the deep and subcortical white matter. No cortical or large vessel territory infarction. No mass lesion, hemorrhage, hydrocephalus or extra-axial collection. Vascular: Major vessels at the base of the brain show flow. Skull and upper cervical spine: Negative Sinuses/Orbits: Clear/normal Other: None MRA HEAD FINDINGS Both internal carotid arteries are widely patent into the brain. No siphon stenosis. The anterior and middle cerebral vessels are patent without proximal stenosis, aneurysm or vascular malformation. Both vertebral arteries are widely patent to the basilar. No basilar stenosis. Posterior circulation branch vessels appear normal. MRA NECK FINDINGS Both common carotid arteries are patent to the bifurcation region. Carotid bifurcation and cervical ICA on the left are normal. On the right, there is some atherosclerotic plaque at the distal bulb region with stenosis of 30%. Cervical ICA widely patent beyond that. Both vertebral artery origins are patent. Both vertebral arteries appear patent and normal through the cervical region with antegrade flow. IMPRESSION: 1. No acute intracranial finding. Extensive chronic small-vessel ischemic change of the cerebral hemispheric white matter. 2. No intracranial large or medium vessel occlusion or correctable proximal stenosis. 3. Atherosclerotic plaque at the distal ICA bulb region on the right with 30% stenosis. Left carotid bifurcation widely patent. Electronically Signed   By: Nelson Chimes M.D.   On: 05/09/2020 16:55   MR ANGIO  NECK WO CONTRAST  Result Date: 05/09/2020 CLINICAL DATA:  Code stroke presentation.  Altered mental status. EXAM: MRI HEAD WITHOUT CONTRAST MRA HEAD WITHOUT CONTRAST MRA NECK WITHOUT CONTRAST TECHNIQUE: Multiplanar, multiecho pulse sequences of the brain and surrounding structures were obtained  without intravenous contrast. Angiographic images of the Circle of Willis were obtained using MRA technique without intravenous contrast. Angiographic images of the neck were obtained using MRA technique without intravenous contrast. Carotid stenosis measurements (when applicable) are obtained utilizing NASCET criteria, using the distal internal carotid diameter as the denominator. COMPARISON:  CT study same day FINDINGS: MRI HEAD FINDINGS Brain: Diffusion imaging does not show any acute or subacute infarction. The brainstem and cerebellum are normal. Cerebral hemispheres show age related volume loss with extensive chronic small-vessel ischemic change within the deep and subcortical white matter. No cortical or large vessel territory infarction. No mass lesion, hemorrhage, hydrocephalus or extra-axial collection. Vascular: Major vessels at the base of the brain show flow. Skull and upper cervical spine: Negative Sinuses/Orbits: Clear/normal Other: None MRA HEAD FINDINGS Both internal carotid arteries are widely patent into the brain. No siphon stenosis. The anterior and middle cerebral vessels are patent without proximal stenosis, aneurysm or vascular malformation. Both vertebral arteries are widely patent to the basilar. No basilar stenosis. Posterior circulation branch vessels appear normal. MRA NECK FINDINGS Both common carotid arteries are patent to the bifurcation region. Carotid bifurcation and cervical ICA on the left are normal. On the right, there is some atherosclerotic plaque at the distal bulb region with stenosis of 30%. Cervical ICA widely patent beyond that. Both vertebral artery origins are patent. Both vertebral arteries appear patent and normal through the cervical region with antegrade flow. IMPRESSION: 1. No acute intracranial finding. Extensive chronic small-vessel ischemic change of the cerebral hemispheric white matter. 2. No intracranial large or medium vessel occlusion or correctable  proximal stenosis. 3. Atherosclerotic plaque at the distal ICA bulb region on the right with 30% stenosis. Left carotid bifurcation widely patent. Electronically Signed   By: Nelson Chimes M.D.   On: 05/09/2020 16:55   MR BRAIN WO CONTRAST  Result Date: 05/09/2020 CLINICAL DATA:  Code stroke presentation.  Altered mental status. EXAM: MRI HEAD WITHOUT CONTRAST MRA HEAD WITHOUT CONTRAST MRA NECK WITHOUT CONTRAST TECHNIQUE: Multiplanar, multiecho pulse sequences of the brain and surrounding structures were obtained without intravenous contrast. Angiographic images of the Circle of Willis were obtained using MRA technique without intravenous contrast. Angiographic images of the neck were obtained using MRA technique without intravenous contrast. Carotid stenosis measurements (when applicable) are obtained utilizing NASCET criteria, using the distal internal carotid diameter as the denominator. COMPARISON:  CT study same day FINDINGS: MRI HEAD FINDINGS Brain: Diffusion imaging does not show any acute or subacute infarction. The brainstem and cerebellum are normal. Cerebral hemispheres show age related volume loss with extensive chronic small-vessel ischemic change within the deep and subcortical white matter. No cortical or large vessel territory infarction. No mass lesion, hemorrhage, hydrocephalus or extra-axial collection. Vascular: Major vessels at the base of the brain show flow. Skull and upper cervical spine: Negative Sinuses/Orbits: Clear/normal Other: None MRA HEAD FINDINGS Both internal carotid arteries are widely patent into the brain. No siphon stenosis. The anterior and middle cerebral vessels are patent without proximal stenosis, aneurysm or vascular malformation. Both vertebral arteries are widely patent to the basilar. No basilar stenosis. Posterior circulation branch vessels appear normal. MRA NECK FINDINGS Both common carotid arteries are patent to the bifurcation region. Carotid bifurcation and  cervical ICA on the left are normal. On the right, there is some atherosclerotic plaque at the distal bulb region with stenosis of 30%. Cervical ICA widely patent beyond that. Both vertebral artery origins are patent. Both vertebral arteries appear patent and normal through the cervical region with antegrade flow. IMPRESSION: 1. No acute intracranial finding. Extensive chronic small-vessel ischemic change of the cerebral hemispheric white matter. 2. No intracranial large or medium vessel occlusion or correctable proximal stenosis. 3. Atherosclerotic plaque at the distal ICA bulb region on the right with 30% stenosis. Left carotid bifurcation widely patent. Electronically Signed   By: Nelson Chimes M.D.   On: 05/09/2020 16:55   DG Chest Port 1 View  Result Date: 05/09/2020 CLINICAL DATA:  Confusion. The patient has suffered multiple falls yesterday evening. EXAM: PORTABLE CHEST 1 VIEW COMPARISON:  Single-view of the chest 11/25/2019. CT chest 11/23/2019. FINDINGS: The lungs are clear. Heart size is normal. No pneumothorax or pleural fluid. No acute or focal bony abnormality. IMPRESSION: No acute disease. Electronically Signed   By: Inge Rise M.D.   On: 05/09/2020 12:53   EEG adult  Result Date: 05/10/2020 Lora Havens, MD     05/10/2020  7:35 PM Patient Name: PAWEL SOULES MRN: [id number] Epilepsy Attending: Lora Havens Referring Physician/Provider: Dr Rosalin Hawking Date: 05/10/2020 Duration: 26.29 mins Patient history: 87 year old male with vascular dementia, frequent falls, and longstanding history of balance problems who presented today as a Code Stroke for evaluation of confusion and aphasia after sustaining a fall (possibly hitting head). EEG to evaluate for seizure. Level of alertness: Awake AEDs during EEG study: None Technical aspects: This EEG study was done with scalp electrodes positioned according to the 10-20 International system of electrode placement. Electrical activity was  acquired at a sampling rate of 500Hz  and reviewed with a high frequency filter of 70Hz  and a low frequency filter of 1Hz . EEG data were recorded continuously and digitally stored. Description: The posterior dominant rhythm consists of 8-9 Hz activity of moderate voltage (25-35 uV) seen predominantly in posterior head regions, symmetric and reactive to eye opening and eye closing. Drowsiness was characterized by attenuation of the posterior background rhythm. Hyperventilation and photic stimulation were not performed.   Of note, study was technically difficult due to significant electrode artifact as patient was confused and pulling electrodes. IMPRESSION: This technically difficult study is within normal limits. No seizures or epileptiform discharges were seen throughout the recording. If concern for ictal-interictal activity persists, can consider prolonged monitoring Lora Havens   ECHOCARDIOGRAM COMPLETE  Result Date: 05/10/2020    ECHOCARDIOGRAM REPORT   Patient Name:   TRAVOR ROYCE Date of Exam: 05/10/2020 Medical Rec #:  [id number]       Height:       71.0 in Accession #:    [id number]      Weight:       134.9 lb Date of Birth:  Sep 14, 1929       BSA:          1.784 m Patient Age:    56 years        BP:           134/63 mmHg Patient Gender: M               HR:           59 bpm. Exam Location:  Inpatient Procedure: 2D Echo, Cardiac Doppler and Color Doppler Indications:    Stroke  History:  Patient has prior history of Echocardiogram examinations, most                 recent 02/24/2018. Risk Factors:Hypertension and Dyslipidemia.                 Bradycardia.  Sonographer:    Clayton Lefort RDCS (AE) Referring Phys: [id number] [place]  1. Left ventricular ejection fraction, by estimation, is 60 to 65%. The left ventricle has normal function. The left ventricle has no regional wall motion abnormalities. Left ventricular diastolic parameters are consistent with Grade I diastolic  dysfunction (impaired relaxation).  2. Right ventricular systolic function is normal. The right ventricular size is normal. There is normal pulmonary artery systolic pressure. The estimated right ventricular systolic pressure is 0000000 mmHg.  3. Left atrial size was moderately dilated.  4. The mitral valve is normal in structure. No evidence of mitral valve regurgitation. No evidence of mitral stenosis.  5. The aortic valve is tricuspid. There is moderate calcification of the aortic valve. There is moderate thickening of the aortic valve. Aortic valve regurgitation is not visualized. Mild to moderate aortic valve sclerosis/calcification is present, without any evidence of aortic stenosis. Aortic valve mean gradient measures 3.0 mmHg. Aortic valve Vmax measures 1.31 m/s.  6. The inferior vena cava is dilated in size with <50% respiratory variability, suggesting right atrial pressure of 15 mmHg. Conclusion(s)/Recommendation(s): No intracardiac source of embolism detected on this transthoracic study. A transesophageal echocardiogram is recommended to exclude cardiac source of embolism if clinically indicated. FINDINGS  Left Ventricle: Left ventricular ejection fraction, by estimation, is 60 to 65%. The left ventricle has normal function. The left ventricle has no regional wall motion abnormalities. The left ventricular internal cavity size was normal in size. There is  no left ventricular hypertrophy. Left ventricular diastolic parameters are consistent with Grade I diastolic dysfunction (impaired relaxation). Right Ventricle: The right ventricular size is normal. No increase in right ventricular wall thickness. Right ventricular systolic function is normal. There is normal pulmonary artery systolic pressure. The tricuspid regurgitant velocity is 1.64 m/s, and  with an assumed right atrial pressure of 15 mmHg, the estimated right ventricular systolic pressure is 0000000 mmHg. Left Atrium: Left atrial size was moderately  dilated. Right Atrium: Right atrial size was normal in size. Pericardium: There is no evidence of pericardial effusion. Mitral Valve: The mitral valve is normal in structure. No evidence of mitral valve regurgitation. No evidence of mitral valve stenosis. MV peak gradient, 2.4 mmHg. The mean mitral valve gradient is 1.0 mmHg. Tricuspid Valve: The tricuspid valve is normal in structure. Tricuspid valve regurgitation is trivial. No evidence of tricuspid stenosis. Aortic Valve: The aortic valve is tricuspid. There is moderate calcification of the aortic valve. There is moderate thickening of the aortic valve. Aortic valve regurgitation is not visualized. Mild to moderate aortic valve sclerosis/calcification is present, without any evidence of aortic stenosis. Aortic valve mean gradient measures 3.0 mmHg. Aortic valve peak gradient measures 6.9 mmHg. Aortic valve area, by VTI measures 2.97 cm. Pulmonic Valve: The pulmonic valve was normal in structure. Pulmonic valve regurgitation is trivial. No evidence of pulmonic stenosis. Aorta: The aortic root is normal in size and structure. Venous: The inferior vena cava is dilated in size with less than 50% respiratory variability, suggesting right atrial pressure of 15 mmHg. IAS/Shunts: No atrial level shunt detected by color flow Doppler.  LEFT VENTRICLE PLAX 2D LVIDd:         4.70 cm  Diastology LVIDs:         2.80 cm  LV e' medial:    8.59 cm/s LV PW:         1.00 cm  LV E/e' medial:  8.9 LV IVS:        0.90 cm  LV e' lateral:   12.10 cm/s LVOT diam:     2.20 cm  LV E/e' lateral: 6.3 LV SV:         81 LV SV Index:   46 LVOT Area:     3.80 cm  RIGHT VENTRICLE             IVC RV Basal diam:  3.80 cm     IVC diam: 2.20 cm RV Mid diam:    2.60 cm RV S prime:     14.60 cm/s TAPSE (M-mode): 2.5 cm LEFT ATRIUM             Index       RIGHT ATRIUM           Index LA diam:        2.60 cm 1.46 cm/m  RA Area:     19.80 cm LA Vol (A2C):   47.2 ml 26.46 ml/m RA Volume:   58.70 ml   32.91 ml/m LA Vol (A4C):   41.4 ml 23.21 ml/m LA Biplane Vol: 48.1 ml 26.97 ml/m  AORTIC VALVE AV Area (Vmax):    3.28 cm AV Area (Vmean):   3.15 cm AV Area (VTI):     2.97 cm AV Vmax:           131.00 cm/s AV Vmean:          86.800 cm/s AV VTI:            0.274 m AV Peak Grad:      6.9 mmHg AV Mean Grad:      3.0 mmHg LVOT Vmax:         113.00 cm/s LVOT Vmean:        71.900 cm/s LVOT VTI:          0.214 m LVOT/AV VTI ratio: 0.78  AORTA Ao Root diam: 3.30 cm Ao Asc diam:  3.40 cm MITRAL VALVE               TRICUSPID VALVE MV Area (PHT): 2.87 cm    TR Peak grad:   10.8 mmHg MV Area VTI:   2.99 cm    TR Vmax:        164.00 cm/s MV Peak grad:  2.4 mmHg MV Mean grad:  1.0 mmHg    SHUNTS MV Vmax:       0.77 m/s    Systemic VTI:  0.21 m MV Vmean:      46.9 cm/s   Systemic Diam: 2.20 cm MV Decel Time: 264 msec MV E velocity: 76.30 cm/s MV A velocity: 57.80 cm/s MV E/A ratio:  1.32 Candee Furbish MD Electronically signed by Candee Furbish MD Signature Date/Time: 05/10/2020/2:38:10 PM    Final    CT HEAD CODE STROKE WO CONTRAST  Result Date: 05/09/2020 CLINICAL DATA:  Code stroke.  Altered mental status. EXAM: CT HEAD WITHOUT CONTRAST TECHNIQUE: Contiguous axial images were obtained from the base of the skull through the vertex without intravenous contrast. COMPARISON:  05/01/2020 FINDINGS: Brain: There is no evidence of an acute infarct, intracranial hemorrhage, mass, midline shift, or extra-axial fluid collection. A tiny chronic cortical/subcortical infarct is again seen in the right superior frontal  gyrus. Confluent hypodensities in the cerebral white matter bilaterally are unchanged and nonspecific but compatible with severe chronic small vessel ischemic disease. There is mild-to-moderate cerebral atrophy. Vascular: Calcified atherosclerosis at the skull base. No hyperdense vessel. Skull: No fracture or suspicious osseous lesion. Sinuses/Orbits: Partially visualized mild mucosal thickening in the left maxillary  sinus. Clear mastoid air cells. Bilateral cataract extraction. Other: Mild soft tissue swelling/small hematoma is involving the right greater than left parietal scalp. ASPECTS [facility] Stroke Program Early CT Score) - Ganglionic level infarction (caudate, lentiform nuclei, internal capsule, insula, M1-M3 cortex): 7 - Supraganglionic infarction (M4-M6 cortex): 3 Total score (0-10 with 10 being normal): 10 IMPRESSION: 1. No evidence of acute intracranial abnormality. 2. ASPECTS is 10. 3. Severe chronic small vessel ischemic disease. These results were communicated to Dr. Quinn Axe at 12:02 pm on 05/09/2020 by text page via the [facility] messaging system. Electronically Signed   By: Logan Bores M.D.   On: 05/09/2020 12:02     Labs:   Basic Metabolic Panel: Recent Labs  Lab 05/09/20 1148 05/09/20 1151  NA 138 140  K 3.9 3.9  CL 105 105  CO2 27  --   GLUCOSE 107* 102*  BUN 17 19  CREATININE 0.79 0.80  CALCIUM 9.0  --    GFR Estimated Creatinine Clearance: 55.3 mL/min (by C-G formula based on SCr of 0.8 mg/dL). Liver Function Tests: Recent Labs  Lab 05/09/20 1148 05/10/20 0415  AST 83* 57*  ALT 151* 123*  ALKPHOS 543* 503*  BILITOT 0.9 1.2  PROT 6.2* 5.7*  ALBUMIN 3.1* 2.8*   No results for input(s): LIPASE, AMYLASE in the last 168 hours. Recent Labs  Lab 05/10/20 0415  AMMONIA 27   Coagulation profile Recent Labs  Lab 05/09/20 1148  INR 0.9    CBC: Recent Labs  Lab 05/09/20 1148 05/09/20 1151  WBC 7.6  --   NEUTROABS 5.6  --   HGB 10.8* 11.6*  HCT 34.2* 34.0*  MCV 93.2  --   PLT 355  --    Cardiac Enzymes: No results for input(s): CKTOTAL, CKMB, CKMBINDEX, TROPONINI in the last 168 hours. BNP: Invalid input(s): POCBNP CBG: Recent Labs  Lab 05/09/20 1146  GLUCAP 93   D-Dimer No results for input(s): DDIMER in the last 72 hours. Hgb A1c No results for input(s): HGBA1C in the last 72 hours. Lipid Profile No results for input(s): CHOL, HDL, LDLCALC, TRIG,  CHOLHDL, LDLDIRECT in the last 72 hours. Thyroid function studies No results for input(s): TSH, T4TOTAL, T3FREE, THYROIDAB in the last 72 hours.  Invalid input(s): FREET3 Anemia work up No results for input(s): VITAMINB12, FOLATE, FERRITIN, TIBC, IRON, RETICCTPCT in the last 72 hours. Microbiology Recent Results (from the past 240 hour(s))  Resp Panel by RT-PCR (Flu A&B, Covid) Nasopharyngeal Swab     Status: None   Collection Time: 05/09/20 11:49 AM   Specimen: Nasopharyngeal Swab; Nasopharyngeal(NP) swabs in vial transport medium  Result Value Ref Range Status   SARS Coronavirus 2 by RT PCR NEGATIVE NEGATIVE Final    Comment: (NOTE) SARS-CoV-2 target nucleic acids are NOT DETECTED.  The SARS-CoV-2 RNA is generally detectable in upper respiratory specimens during the acute phase of infection. The lowest concentration of SARS-CoV-2 viral copies this assay can detect is 138 copies/mL. A negative result does not preclude SARS-Cov-2 infection and should not be used as the sole basis for treatment or other patient management decisions. A negative result may occur with  improper specimen collection/handling, submission of  specimen other than nasopharyngeal swab, presence of viral mutation(s) within the areas targeted by this assay, and inadequate number of viral copies(<138 copies/mL). A negative result must be combined with clinical observations, patient history, and epidemiological information. The expected result is Negative.  Fact Sheet for Patients:  EntrepreneurPulse.com.au  Fact Sheet for Healthcare Providers:  IncredibleEmployment.be  This test is no t yet approved or cleared by the [place] FDA and  has been authorized for detection and/or diagnosis of SARS-CoV-2 by FDA under an Emergency Use Authorization (EUA). This EUA will remain  in effect (meaning this test can be used) for the duration of the COVID-19 declaration under Section  564(b)(1) of the Act, 21 U.S.C.section 360bbb-3(b)(1), unless the authorization is terminated  or revoked sooner.       Influenza A by PCR NEGATIVE NEGATIVE Final   Influenza B by PCR NEGATIVE NEGATIVE Final    Comment: (NOTE) The Xpert Xpress SARS-CoV-2/FLU/RSV plus assay is intended as an aid in the diagnosis of influenza from Nasopharyngeal swab specimens and should not be used as a sole basis for treatment. Nasal washings and aspirates are unacceptable for Xpert Xpress SARS-CoV-2/FLU/RSV testing.  Fact Sheet for Patients: EntrepreneurPulse.com.au  Fact Sheet for Healthcare Providers: IncredibleEmployment.be  This test is not yet approved or cleared by the [place] FDA and has been authorized for detection and/or diagnosis of SARS-CoV-2 by FDA under an Emergency Use Authorization (EUA). This EUA will remain in effect (meaning this test can be used) for the duration of the COVID-19 declaration under Section 564(b)(1) of the Act, 21 U.S.C. section 360bbb-3(b)(1), unless the authorization is terminated or revoked.  Performed at [facility], [place] [address] [place], [place] [address]      Discharge Instructions:   Discharge Instructions    Ambulatory referral to Neurology   Complete by: As directed    Follow up with Ernestine Mcmurray at [facility] in about 4 weeks. Thanks.   Diet general   Complete by: As directed    Increase activity slowly   Complete by: As directed    No wound care   Complete by: As directed      Allergies as of 05/15/2020      Reactions   Amoxicillin Swelling, Rash, Other (See Comments)   Swelling and rash to face Has patient had a PCN reaction causing immediate rash, facial/tongue/throat swelling, SOB or lightheadedness with hypotension: Yes Has patient had a PCN reaction causing severe rash involving mucus membranes or skin necrosis: No Has patient had a PCN reaction that required hospitalization: No Has  patient had a PCN reaction occurring within the last 10 years: Yes If all of the above answers are "NO", then may proceed with Cephalosporin use.   Sulfonamide Derivatives Hives   Sulfamethoxazole Hives, Other (See Comments)      Medication List    STOP taking these medications   clindamycin 300 MG capsule Commonly known as: CLEOCIN   diclofenac 75 MG EC tablet Commonly known as: VOLTAREN   levocetirizine 5 MG tablet Commonly known as: XYZAL   simvastatin 20 MG tablet Commonly known as: ZOCOR   tiZANidine 2 MG tablet Commonly known as: ZANAFLEX   traZODone 50 MG tablet Commonly known as: DESYREL     TAKE these medications   acetaminophen 500 MG tablet Commonly known as: TYLENOL Take 2 tablets (1,000 mg total) by mouth every 8 (eight) hours as needed for moderate pain.   docusate sodium 100 MG capsule Commonly known as: COLACE  Take 1 capsule (100 mg total) by mouth 2 (two) times daily. What changed:   when to take this  reasons to take this   latanoprost 0.005 % ophthalmic solution Commonly known as: XALATAN PLACE 1 DROP INTO BOTH EYES NIGHTLY. What changed:   how much to take  how to take this  when to take this   lisinopril 10 MG tablet Commonly known as: ZESTRIL TAKE 1 TABLET (10 MG TOTAL) BY MOUTH DAILY. PLEASE KEEP UPCOMING APPT IN MAY 2022 BEFORE ANYMORE REFILLS. THANK YOU   melatonin 3 MG Tabs tablet Take 1 tablet (3 mg total) by mouth at bedtime.   nitroGLYCERIN 0.4 MG SL tablet Commonly known as: NITROSTAT PLACE 1 TABLET UNDER THE TONGUE EVERY 5 MINUTES AS NEEDED FOR CHEST PAIN. What changed: See the new instructions.   OLANZapine 5 MG tablet Commonly known as: ZYPREXA Take 1 tablet (5 mg total) by mouth at bedtime as needed (agitation/confusion).   polyethylene glycol 17 g packet Commonly known as: MIRALAX / GLYCOLAX Take 17 g by mouth daily as needed for mild constipation.   vitamin B-12 1000 MCG tablet Commonly known as:  CYANOCOBALAMIN Take 1,000 mcg by mouth daily.            Durable Medical Equipment  (From admission, onward)         Start     Ordered   05/13/20 1440  For home use only DME standard manual wheelchair with seat cushion  Once       Comments: Patient suffers from Multiple falls/progressive debility and dementia/acute encephalopathy in the setting of underlying advanced vascular dementia which impairs their ability to perform daily activities like ambulating  in the home.  A cane  will not resolve issue with performing activities of daily living. A wheelchair will allow patient to safely perform daily activities. Patient can safely propel the wheelchair in the home or has a caregiver who can provide assistance. Length of need lifetime . Accessories: elevating leg rests (ELRs), wheel locks, extensions and anti-tippers.  Seat and back cushions   05/13/20 1440   05/13/20 1439  For home use only DME Hospital bed  Once       Comments: Call daughter Manon Hilding [phone] or daughter Ozella Rocks [phone] for delivery thanks  Question Answer Comment  Length of Need Lifetime   Patient has (list medical condition): Multiple falls/progressive debility and dementia/acute encephalopathy in the setting of underlying advanced vascular dementia   The above medical condition requires: Patient requires the ability to reposition frequently   Head must be elevated greater than: 45 degrees   Bed type Semi-electric   Support Surface: Gel Overlay      05/13/20 1440          Follow-up Information    Ward Givens, NP. Schedule an appointment as soon as possible for a visit in 4 week(s).   Specialty: Neurology Contact information: [place] [address] [phone]        Care, [facility] Follow up.   Specialty: Home Health Services Contact information: [place] [address] [phone]                Time  coordinating discharge: 35 min  Signed:  Geradine Girt DO  [facility] 05/15/2020, 10:18 AM

## 2020-05-15 NOTE — Care Management (Addendum)
Spoke to [employer] at [facility]. She is requesting FL2 , discharge summary , TB skin test to be placed and wait on results or have lab TB test done, covid test orders for HHPT,OT and SP, the last 48 hours of progress notes to be faxed to her at [phone] . Admissions coordinator will need to come to hospital for assessment.   [facility] Harmon is still reviewing patient, have not accepted and offered a bed.    If accepted [facility] would not be able to admit patient today.    MD, and nurse aware.  1400 faxed above information to Pattie at [facility] except covid test results ( not resulted ) and TB test.   Tommi Rumps with [facility] updated on plan.   Magdalen Spatz RN

## 2020-05-16 DIAGNOSIS — R5381 Other malaise: Secondary | ICD-10-CM | POA: Diagnosis not present

## 2020-05-16 DIAGNOSIS — E441 Mild protein-calorie malnutrition: Secondary | ICD-10-CM | POA: Diagnosis not present

## 2020-05-16 DIAGNOSIS — I739 Peripheral vascular disease, unspecified: Secondary | ICD-10-CM | POA: Diagnosis not present

## 2020-05-16 DIAGNOSIS — F0391 Unspecified dementia with behavioral disturbance: Secondary | ICD-10-CM | POA: Diagnosis not present

## 2020-05-19 ENCOUNTER — Ambulatory Visit: Payer: Medicare Other | Admitting: Physician Assistant

## 2020-05-19 DIAGNOSIS — R2689 Other abnormalities of gait and mobility: Secondary | ICD-10-CM | POA: Diagnosis not present

## 2020-05-19 DIAGNOSIS — F0151 Vascular dementia with behavioral disturbance: Secondary | ICD-10-CM | POA: Diagnosis not present

## 2020-05-19 DIAGNOSIS — R296 Repeated falls: Secondary | ICD-10-CM | POA: Diagnosis not present

## 2020-05-19 DIAGNOSIS — R1310 Dysphagia, unspecified: Secondary | ICD-10-CM | POA: Diagnosis not present

## 2020-05-20 DIAGNOSIS — R296 Repeated falls: Secondary | ICD-10-CM | POA: Diagnosis not present

## 2020-05-20 DIAGNOSIS — F0151 Vascular dementia with behavioral disturbance: Secondary | ICD-10-CM | POA: Diagnosis not present

## 2020-05-20 DIAGNOSIS — R1310 Dysphagia, unspecified: Secondary | ICD-10-CM | POA: Diagnosis not present

## 2020-05-20 DIAGNOSIS — R2689 Other abnormalities of gait and mobility: Secondary | ICD-10-CM | POA: Diagnosis not present

## 2020-05-21 DIAGNOSIS — R2689 Other abnormalities of gait and mobility: Secondary | ICD-10-CM | POA: Diagnosis not present

## 2020-05-21 DIAGNOSIS — F0151 Vascular dementia with behavioral disturbance: Secondary | ICD-10-CM | POA: Diagnosis not present

## 2020-05-21 DIAGNOSIS — R1310 Dysphagia, unspecified: Secondary | ICD-10-CM | POA: Diagnosis not present

## 2020-05-21 DIAGNOSIS — R296 Repeated falls: Secondary | ICD-10-CM | POA: Diagnosis not present

## 2020-05-22 DIAGNOSIS — F0151 Vascular dementia with behavioral disturbance: Secondary | ICD-10-CM | POA: Diagnosis not present

## 2020-05-22 DIAGNOSIS — R296 Repeated falls: Secondary | ICD-10-CM | POA: Diagnosis not present

## 2020-05-22 DIAGNOSIS — R1310 Dysphagia, unspecified: Secondary | ICD-10-CM | POA: Diagnosis not present

## 2020-05-22 DIAGNOSIS — R2689 Other abnormalities of gait and mobility: Secondary | ICD-10-CM | POA: Diagnosis not present

## 2020-05-23 DIAGNOSIS — R1310 Dysphagia, unspecified: Secondary | ICD-10-CM | POA: Diagnosis not present

## 2020-05-23 DIAGNOSIS — R2689 Other abnormalities of gait and mobility: Secondary | ICD-10-CM | POA: Diagnosis not present

## 2020-05-23 DIAGNOSIS — F0151 Vascular dementia with behavioral disturbance: Secondary | ICD-10-CM | POA: Diagnosis not present

## 2020-05-23 DIAGNOSIS — R296 Repeated falls: Secondary | ICD-10-CM | POA: Diagnosis not present

## 2020-05-26 DIAGNOSIS — R296 Repeated falls: Secondary | ICD-10-CM | POA: Diagnosis not present

## 2020-05-26 DIAGNOSIS — R1310 Dysphagia, unspecified: Secondary | ICD-10-CM | POA: Diagnosis not present

## 2020-05-26 DIAGNOSIS — R2689 Other abnormalities of gait and mobility: Secondary | ICD-10-CM | POA: Diagnosis not present

## 2020-05-26 DIAGNOSIS — F0151 Vascular dementia with behavioral disturbance: Secondary | ICD-10-CM | POA: Diagnosis not present

## 2020-05-27 DIAGNOSIS — F0151 Vascular dementia with behavioral disturbance: Secondary | ICD-10-CM | POA: Diagnosis not present

## 2020-05-27 DIAGNOSIS — R1310 Dysphagia, unspecified: Secondary | ICD-10-CM | POA: Diagnosis not present

## 2020-05-27 DIAGNOSIS — R296 Repeated falls: Secondary | ICD-10-CM | POA: Diagnosis not present

## 2020-05-27 DIAGNOSIS — R2689 Other abnormalities of gait and mobility: Secondary | ICD-10-CM | POA: Diagnosis not present

## 2020-05-28 DIAGNOSIS — R1310 Dysphagia, unspecified: Secondary | ICD-10-CM | POA: Diagnosis not present

## 2020-05-28 DIAGNOSIS — R296 Repeated falls: Secondary | ICD-10-CM | POA: Diagnosis not present

## 2020-05-28 DIAGNOSIS — R2689 Other abnormalities of gait and mobility: Secondary | ICD-10-CM | POA: Diagnosis not present

## 2020-05-28 DIAGNOSIS — F0151 Vascular dementia with behavioral disturbance: Secondary | ICD-10-CM | POA: Diagnosis not present

## 2020-05-29 DIAGNOSIS — F32A Depression, unspecified: Secondary | ICD-10-CM | POA: Diagnosis not present

## 2020-05-29 DIAGNOSIS — F418 Other specified anxiety disorders: Secondary | ICD-10-CM | POA: Diagnosis not present

## 2020-05-29 DIAGNOSIS — F5101 Primary insomnia: Secondary | ICD-10-CM | POA: Diagnosis not present

## 2020-05-29 DIAGNOSIS — F0390 Unspecified dementia without behavioral disturbance: Secondary | ICD-10-CM | POA: Diagnosis not present

## 2020-05-30 DIAGNOSIS — R2689 Other abnormalities of gait and mobility: Secondary | ICD-10-CM | POA: Diagnosis not present

## 2020-05-30 DIAGNOSIS — F0151 Vascular dementia with behavioral disturbance: Secondary | ICD-10-CM | POA: Diagnosis not present

## 2020-05-30 DIAGNOSIS — R1310 Dysphagia, unspecified: Secondary | ICD-10-CM | POA: Diagnosis not present

## 2020-05-30 DIAGNOSIS — R296 Repeated falls: Secondary | ICD-10-CM | POA: Diagnosis not present

## 2020-06-09 DIAGNOSIS — F339 Major depressive disorder, recurrent, unspecified: Secondary | ICD-10-CM | POA: Diagnosis not present

## 2020-06-09 DIAGNOSIS — I119 Hypertensive heart disease without heart failure: Secondary | ICD-10-CM | POA: Diagnosis not present

## 2020-06-09 DIAGNOSIS — R1310 Dysphagia, unspecified: Secondary | ICD-10-CM | POA: Diagnosis not present

## 2020-06-09 DIAGNOSIS — I739 Peripheral vascular disease, unspecified: Secondary | ICD-10-CM | POA: Diagnosis not present

## 2020-06-11 ENCOUNTER — Encounter: Payer: Self-pay | Admitting: Adult Health

## 2020-06-16 ENCOUNTER — Telehealth: Payer: Self-pay | Admitting: Adult Health

## 2020-06-16 NOTE — Telephone Encounter (Signed)
Left message voicemail for Jesus Harmon daughter of patient. patient now at [facility] under palliative care.  She is to call back at her convenience.

## 2020-06-16 NOTE — Telephone Encounter (Signed)
Pt's daughter, Manon Hilding (on [place]) called, he has been placed in a facility. Would like to discuss his care with you. Would like a call from the nurse.

## 2020-06-16 NOTE — Telephone Encounter (Signed)
Jesus Harmon has returned RN's call and LVM for a call back.

## 2020-06-17 NOTE — Telephone Encounter (Signed)
I called daughter, [name].  Patient now residing in [place] facility.  She had sent an email to make an but had not heard back.  Patient had an appointment tomorrow which we canceled as she did not feel that patient could make the appointment for a would be more difficult for him.  Apparently he had had 2 falls was sent to the emergency room head testing done and his dementia is much worse.  She states he is not walking.  Cognitively he is much worse having hallucinations some agitation.  She requested to speak with [facility] regarding tests that were done in the hospital.   I told her I would relay that information to make an we will get back with her.  Pt last seen 03-19-20.

## 2020-06-18 ENCOUNTER — Inpatient Hospital Stay: Payer: Medicare Other | Admitting: Adult Health

## 2020-06-18 NOTE — Telephone Encounter (Signed)
I called the patient's daughter [name].  Advised that I have reviewed the hospital notes.  It does seem that his dementia has advanced.  I do not think is necessary that she brings him to the office.  He is currently not on any medication for his memory.  He is on Zyprexa but this can be managed by the facility.  She voiced understanding and appreciation of the call

## 2020-07-08 DIAGNOSIS — R2689 Other abnormalities of gait and mobility: Secondary | ICD-10-CM | POA: Diagnosis not present

## 2020-07-08 DIAGNOSIS — M6281 Muscle weakness (generalized): Secondary | ICD-10-CM | POA: Diagnosis not present

## 2020-07-08 DIAGNOSIS — F0151 Vascular dementia with behavioral disturbance: Secondary | ICD-10-CM | POA: Diagnosis not present

## 2020-07-09 DIAGNOSIS — F0391 Unspecified dementia with behavioral disturbance: Secondary | ICD-10-CM | POA: Diagnosis not present

## 2020-07-09 DIAGNOSIS — R5381 Other malaise: Secondary | ICD-10-CM | POA: Diagnosis not present

## 2020-07-09 DIAGNOSIS — E441 Mild protein-calorie malnutrition: Secondary | ICD-10-CM | POA: Diagnosis not present

## 2020-07-09 DIAGNOSIS — D649 Anemia, unspecified: Secondary | ICD-10-CM | POA: Diagnosis not present

## 2020-07-10 ENCOUNTER — Ambulatory Visit: Payer: Medicare Other | Admitting: Podiatry

## 2020-07-11 DIAGNOSIS — R2689 Other abnormalities of gait and mobility: Secondary | ICD-10-CM | POA: Diagnosis not present

## 2020-07-11 DIAGNOSIS — F0151 Vascular dementia with behavioral disturbance: Secondary | ICD-10-CM | POA: Diagnosis not present

## 2020-07-11 DIAGNOSIS — M6281 Muscle weakness (generalized): Secondary | ICD-10-CM | POA: Diagnosis not present

## 2020-07-12 DIAGNOSIS — F0151 Vascular dementia with behavioral disturbance: Secondary | ICD-10-CM | POA: Diagnosis not present

## 2020-07-12 DIAGNOSIS — M6281 Muscle weakness (generalized): Secondary | ICD-10-CM | POA: Diagnosis not present

## 2020-07-12 DIAGNOSIS — R2689 Other abnormalities of gait and mobility: Secondary | ICD-10-CM | POA: Diagnosis not present

## 2020-07-17 DIAGNOSIS — R2689 Other abnormalities of gait and mobility: Secondary | ICD-10-CM | POA: Diagnosis not present

## 2020-07-17 DIAGNOSIS — M6281 Muscle weakness (generalized): Secondary | ICD-10-CM | POA: Diagnosis not present

## 2020-07-17 DIAGNOSIS — F0151 Vascular dementia with behavioral disturbance: Secondary | ICD-10-CM | POA: Diagnosis not present

## 2020-07-18 DIAGNOSIS — R2689 Other abnormalities of gait and mobility: Secondary | ICD-10-CM | POA: Diagnosis not present

## 2020-07-18 DIAGNOSIS — M6281 Muscle weakness (generalized): Secondary | ICD-10-CM | POA: Diagnosis not present

## 2020-07-18 DIAGNOSIS — F0151 Vascular dementia with behavioral disturbance: Secondary | ICD-10-CM | POA: Diagnosis not present

## 2020-07-19 DIAGNOSIS — M6281 Muscle weakness (generalized): Secondary | ICD-10-CM | POA: Diagnosis not present

## 2020-07-19 DIAGNOSIS — R2689 Other abnormalities of gait and mobility: Secondary | ICD-10-CM | POA: Diagnosis not present

## 2020-07-19 DIAGNOSIS — F0151 Vascular dementia with behavioral disturbance: Secondary | ICD-10-CM | POA: Diagnosis not present

## 2020-07-20 DIAGNOSIS — M6281 Muscle weakness (generalized): Secondary | ICD-10-CM | POA: Diagnosis not present

## 2020-07-20 DIAGNOSIS — R2689 Other abnormalities of gait and mobility: Secondary | ICD-10-CM | POA: Diagnosis not present

## 2020-07-20 DIAGNOSIS — F0151 Vascular dementia with behavioral disturbance: Secondary | ICD-10-CM | POA: Diagnosis not present

## 2020-07-21 DIAGNOSIS — R2689 Other abnormalities of gait and mobility: Secondary | ICD-10-CM | POA: Diagnosis not present

## 2020-07-21 DIAGNOSIS — F0151 Vascular dementia with behavioral disturbance: Secondary | ICD-10-CM | POA: Diagnosis not present

## 2020-07-21 DIAGNOSIS — M6281 Muscle weakness (generalized): Secondary | ICD-10-CM | POA: Diagnosis not present

## 2020-07-24 DIAGNOSIS — F32A Depression, unspecified: Secondary | ICD-10-CM | POA: Diagnosis not present

## 2020-07-24 DIAGNOSIS — F5101 Primary insomnia: Secondary | ICD-10-CM | POA: Diagnosis not present

## 2020-07-24 DIAGNOSIS — M6281 Muscle weakness (generalized): Secondary | ICD-10-CM | POA: Diagnosis not present

## 2020-07-24 DIAGNOSIS — F418 Other specified anxiety disorders: Secondary | ICD-10-CM | POA: Diagnosis not present

## 2020-07-24 DIAGNOSIS — F0151 Vascular dementia with behavioral disturbance: Secondary | ICD-10-CM | POA: Diagnosis not present

## 2020-07-24 DIAGNOSIS — R2689 Other abnormalities of gait and mobility: Secondary | ICD-10-CM | POA: Diagnosis not present

## 2020-07-24 DIAGNOSIS — F0390 Unspecified dementia without behavioral disturbance: Secondary | ICD-10-CM | POA: Diagnosis not present

## 2020-08-04 DIAGNOSIS — M2042 Other hammer toe(s) (acquired), left foot: Secondary | ICD-10-CM | POA: Diagnosis not present

## 2020-08-04 DIAGNOSIS — M2041 Other hammer toe(s) (acquired), right foot: Secondary | ICD-10-CM | POA: Diagnosis not present

## 2020-08-04 DIAGNOSIS — B351 Tinea unguium: Secondary | ICD-10-CM | POA: Diagnosis not present

## 2020-08-04 DIAGNOSIS — I739 Peripheral vascular disease, unspecified: Secondary | ICD-10-CM | POA: Diagnosis not present

## 2020-08-08 DIAGNOSIS — F339 Major depressive disorder, recurrent, unspecified: Secondary | ICD-10-CM | POA: Diagnosis not present

## 2020-08-08 DIAGNOSIS — F0391 Unspecified dementia with behavioral disturbance: Secondary | ICD-10-CM | POA: Diagnosis not present

## 2020-08-08 DIAGNOSIS — I119 Hypertensive heart disease without heart failure: Secondary | ICD-10-CM | POA: Diagnosis not present

## 2020-08-08 DIAGNOSIS — E441 Mild protein-calorie malnutrition: Secondary | ICD-10-CM | POA: Diagnosis not present

## 2020-08-14 DIAGNOSIS — F32A Depression, unspecified: Secondary | ICD-10-CM | POA: Diagnosis not present

## 2020-08-14 DIAGNOSIS — F0390 Unspecified dementia without behavioral disturbance: Secondary | ICD-10-CM | POA: Diagnosis not present

## 2020-08-14 DIAGNOSIS — F418 Other specified anxiety disorders: Secondary | ICD-10-CM | POA: Diagnosis not present

## 2020-08-14 DIAGNOSIS — F5101 Primary insomnia: Secondary | ICD-10-CM | POA: Diagnosis not present

## 2020-09-04 DIAGNOSIS — F0390 Unspecified dementia without behavioral disturbance: Secondary | ICD-10-CM | POA: Diagnosis not present

## 2020-09-04 DIAGNOSIS — Z961 Presence of intraocular lens: Secondary | ICD-10-CM | POA: Diagnosis not present

## 2020-09-04 DIAGNOSIS — H401134 Primary open-angle glaucoma, bilateral, indeterminate stage: Secondary | ICD-10-CM | POA: Diagnosis not present

## 2020-09-08 DIAGNOSIS — E441 Mild protein-calorie malnutrition: Secondary | ICD-10-CM | POA: Diagnosis not present

## 2020-09-08 DIAGNOSIS — M2550 Pain in unspecified joint: Secondary | ICD-10-CM | POA: Diagnosis not present

## 2020-09-08 DIAGNOSIS — D649 Anemia, unspecified: Secondary | ICD-10-CM | POA: Diagnosis not present

## 2020-09-08 DIAGNOSIS — I739 Peripheral vascular disease, unspecified: Secondary | ICD-10-CM | POA: Diagnosis not present

## 2020-09-14 ENCOUNTER — Encounter: Payer: Self-pay | Admitting: Adult Health

## 2020-09-24 ENCOUNTER — Ambulatory Visit: Payer: Medicare Other | Admitting: Adult Health

## 2020-09-25 DIAGNOSIS — F418 Other specified anxiety disorders: Secondary | ICD-10-CM | POA: Diagnosis not present

## 2020-09-25 DIAGNOSIS — F0390 Unspecified dementia without behavioral disturbance: Secondary | ICD-10-CM | POA: Diagnosis not present

## 2020-09-25 DIAGNOSIS — F5101 Primary insomnia: Secondary | ICD-10-CM | POA: Diagnosis not present

## 2020-09-25 DIAGNOSIS — F32A Depression, unspecified: Secondary | ICD-10-CM | POA: Diagnosis not present

## 2020-10-09 DIAGNOSIS — F339 Major depressive disorder, recurrent, unspecified: Secondary | ICD-10-CM | POA: Diagnosis not present

## 2020-10-09 DIAGNOSIS — I739 Peripheral vascular disease, unspecified: Secondary | ICD-10-CM | POA: Diagnosis not present

## 2020-10-09 DIAGNOSIS — I119 Hypertensive heart disease without heart failure: Secondary | ICD-10-CM | POA: Diagnosis not present

## 2020-10-09 DIAGNOSIS — E441 Mild protein-calorie malnutrition: Secondary | ICD-10-CM | POA: Diagnosis not present

## 2020-11-28 DIAGNOSIS — F5101 Primary insomnia: Secondary | ICD-10-CM | POA: Diagnosis not present

## 2020-11-28 DIAGNOSIS — F418 Other specified anxiety disorders: Secondary | ICD-10-CM | POA: Diagnosis not present

## 2020-11-28 DIAGNOSIS — F32A Depression, unspecified: Secondary | ICD-10-CM | POA: Diagnosis not present

## 2020-11-28 DIAGNOSIS — F03C3 Unspecified dementia, severe, with mood disturbance: Secondary | ICD-10-CM | POA: Diagnosis not present

## 2020-12-02 DIAGNOSIS — I10 Essential (primary) hypertension: Secondary | ICD-10-CM | POA: Diagnosis not present

## 2020-12-02 DIAGNOSIS — F01518 Vascular dementia, unspecified severity, with other behavioral disturbance: Secondary | ICD-10-CM | POA: Diagnosis not present

## 2020-12-02 DIAGNOSIS — E43 Unspecified severe protein-calorie malnutrition: Secondary | ICD-10-CM | POA: Diagnosis not present

## 2020-12-02 DIAGNOSIS — I739 Peripheral vascular disease, unspecified: Secondary | ICD-10-CM | POA: Diagnosis not present

## 2020-12-08 DIAGNOSIS — F339 Major depressive disorder, recurrent, unspecified: Secondary | ICD-10-CM | POA: Diagnosis not present

## 2020-12-08 DIAGNOSIS — R1310 Dysphagia, unspecified: Secondary | ICD-10-CM | POA: Diagnosis not present

## 2020-12-08 DIAGNOSIS — M2550 Pain in unspecified joint: Secondary | ICD-10-CM | POA: Diagnosis not present

## 2020-12-08 DIAGNOSIS — D649 Anemia, unspecified: Secondary | ICD-10-CM | POA: Diagnosis not present

## 2020-12-18 DIAGNOSIS — F03C3 Unspecified dementia, severe, with mood disturbance: Secondary | ICD-10-CM | POA: Diagnosis not present

## 2020-12-18 DIAGNOSIS — F5101 Primary insomnia: Secondary | ICD-10-CM | POA: Diagnosis not present

## 2020-12-18 DIAGNOSIS — F32A Depression, unspecified: Secondary | ICD-10-CM | POA: Diagnosis not present

## 2020-12-18 DIAGNOSIS — F418 Other specified anxiety disorders: Secondary | ICD-10-CM | POA: Diagnosis not present

## 2020-12-25 DIAGNOSIS — B351 Tinea unguium: Secondary | ICD-10-CM | POA: Diagnosis not present

## 2020-12-25 DIAGNOSIS — I739 Peripheral vascular disease, unspecified: Secondary | ICD-10-CM | POA: Diagnosis not present

## 2020-12-31 DIAGNOSIS — L300 Nummular dermatitis: Secondary | ICD-10-CM | POA: Diagnosis not present

## 2020-12-31 DIAGNOSIS — L500 Allergic urticaria: Secondary | ICD-10-CM | POA: Diagnosis not present

## 2020-12-31 DIAGNOSIS — D485 Neoplasm of uncertain behavior of skin: Secondary | ICD-10-CM | POA: Diagnosis not present

## 2021-01-13 DIAGNOSIS — U071 COVID-19: Secondary | ICD-10-CM | POA: Diagnosis not present

## 2021-01-27 DIAGNOSIS — L209 Atopic dermatitis, unspecified: Secondary | ICD-10-CM | POA: Diagnosis not present

## 2021-01-27 DIAGNOSIS — L299 Pruritus, unspecified: Secondary | ICD-10-CM | POA: Diagnosis not present

## 2021-02-08 DIAGNOSIS — R1310 Dysphagia, unspecified: Secondary | ICD-10-CM | POA: Diagnosis not present

## 2021-02-08 DIAGNOSIS — F339 Major depressive disorder, recurrent, unspecified: Secondary | ICD-10-CM | POA: Diagnosis not present

## 2021-02-08 DIAGNOSIS — I739 Peripheral vascular disease, unspecified: Secondary | ICD-10-CM | POA: Diagnosis not present

## 2021-02-08 DIAGNOSIS — I119 Hypertensive heart disease without heart failure: Secondary | ICD-10-CM | POA: Diagnosis not present

## 2021-03-03 DIAGNOSIS — L209 Atopic dermatitis, unspecified: Secondary | ICD-10-CM | POA: Diagnosis not present

## 2021-03-05 ENCOUNTER — Other Ambulatory Visit (HOSPITAL_COMMUNITY): Payer: Self-pay

## 2021-03-10 DIAGNOSIS — E441 Mild protein-calorie malnutrition: Secondary | ICD-10-CM | POA: Diagnosis not present

## 2021-03-10 DIAGNOSIS — M2550 Pain in unspecified joint: Secondary | ICD-10-CM | POA: Diagnosis not present

## 2021-03-10 DIAGNOSIS — R5381 Other malaise: Secondary | ICD-10-CM | POA: Diagnosis not present

## 2021-03-10 DIAGNOSIS — D649 Anemia, unspecified: Secondary | ICD-10-CM | POA: Diagnosis not present

## 2021-03-23 ENCOUNTER — Other Ambulatory Visit (HOSPITAL_COMMUNITY): Payer: Self-pay

## 2021-03-24 ENCOUNTER — Other Ambulatory Visit (HOSPITAL_COMMUNITY): Payer: Self-pay

## 2021-03-24 MED ORDER — BETAMETHASONE DIPROPIONATE AUG 0.05 % EX OINT
1.0000 "application " | TOPICAL_OINTMENT | Freq: Two times a day (BID) | CUTANEOUS | 1 refills | Status: DC
  Filled 2021-03-24: qty 60, 30d supply, fill #0
  Filled 2021-04-10: qty 50, 25d supply, fill #1

## 2021-03-25 ENCOUNTER — Other Ambulatory Visit (HOSPITAL_COMMUNITY): Payer: Self-pay

## 2021-03-26 DIAGNOSIS — F5101 Primary insomnia: Secondary | ICD-10-CM | POA: Diagnosis not present

## 2021-03-26 DIAGNOSIS — F03C3 Unspecified dementia, severe, with mood disturbance: Secondary | ICD-10-CM | POA: Diagnosis not present

## 2021-03-26 DIAGNOSIS — F32A Depression, unspecified: Secondary | ICD-10-CM | POA: Diagnosis not present

## 2021-03-26 DIAGNOSIS — F418 Other specified anxiety disorders: Secondary | ICD-10-CM | POA: Diagnosis not present

## 2021-04-08 DIAGNOSIS — I739 Peripheral vascular disease, unspecified: Secondary | ICD-10-CM | POA: Diagnosis not present

## 2021-04-08 DIAGNOSIS — E441 Mild protein-calorie malnutrition: Secondary | ICD-10-CM | POA: Diagnosis not present

## 2021-04-08 DIAGNOSIS — K5900 Constipation, unspecified: Secondary | ICD-10-CM | POA: Diagnosis not present

## 2021-04-08 DIAGNOSIS — F339 Major depressive disorder, recurrent, unspecified: Secondary | ICD-10-CM | POA: Diagnosis not present

## 2021-04-10 ENCOUNTER — Other Ambulatory Visit (HOSPITAL_COMMUNITY): Payer: Self-pay

## 2021-04-12 DIAGNOSIS — N390 Urinary tract infection, site not specified: Secondary | ICD-10-CM | POA: Diagnosis not present

## 2021-04-16 ENCOUNTER — Other Ambulatory Visit (HOSPITAL_COMMUNITY): Payer: Self-pay

## 2021-04-18 DIAGNOSIS — N390 Urinary tract infection, site not specified: Secondary | ICD-10-CM | POA: Diagnosis not present

## 2021-05-04 IMAGING — CT CT CHEST W/O CM
2 of 4 series · 14 of 36 positions shown, 17 images · non-contrast
Comparison: Cervical spine CT November 22, 2019. Chest radiograph
June 24, 2018

CLINICAL DATA: Nodular opacity seen right upper lobe on recent
cervical spine CT examination

EXAM:
CT CHEST WITHOUT CONTRAST
TECHNIQUE: Multidetector CT imaging of the chest was performed following the
standard protocol without IV contrast.

[Series 2: thorax · axial · 0.79mm/px · z∈[-354,-38]mm · 11 of 188 slices shown, 14 images]
[im 15/188  mediastinal]
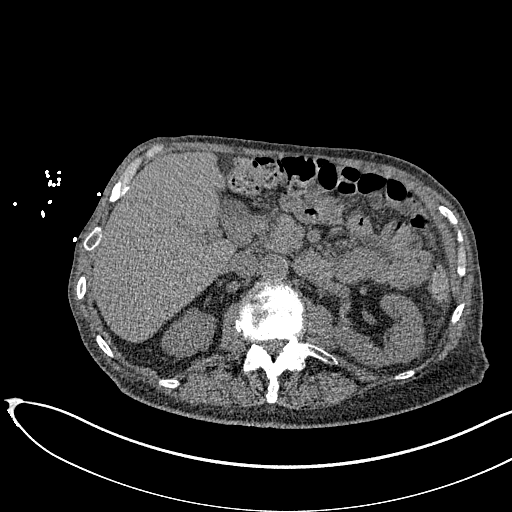
[im 15/188  lung]
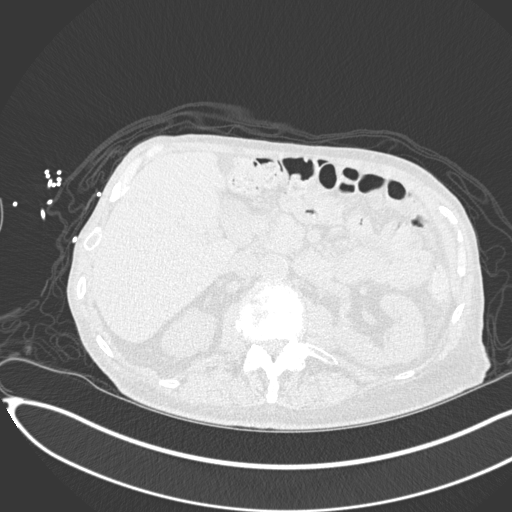
[im 29/188  lung]
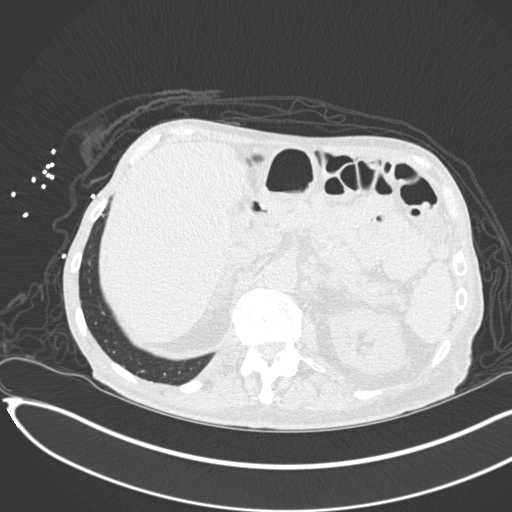
[im 44/188  lung]
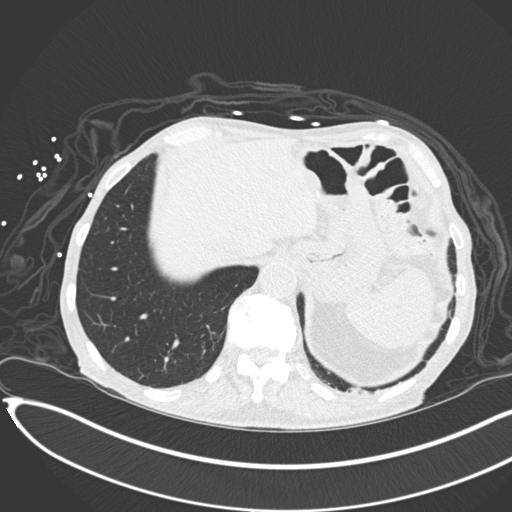
[im 58/188  lung]
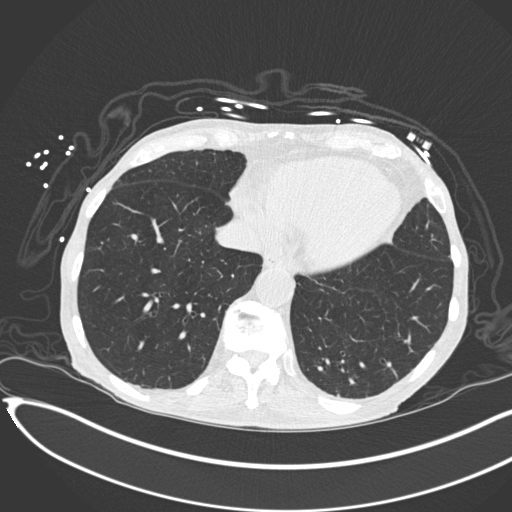
[im 72/188  mediastinal]
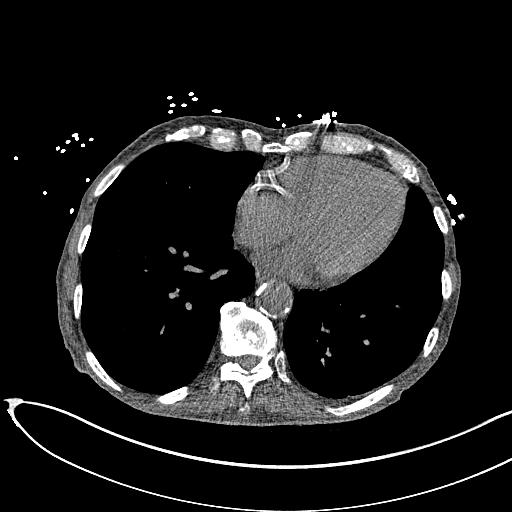
[im 72/188  lung]
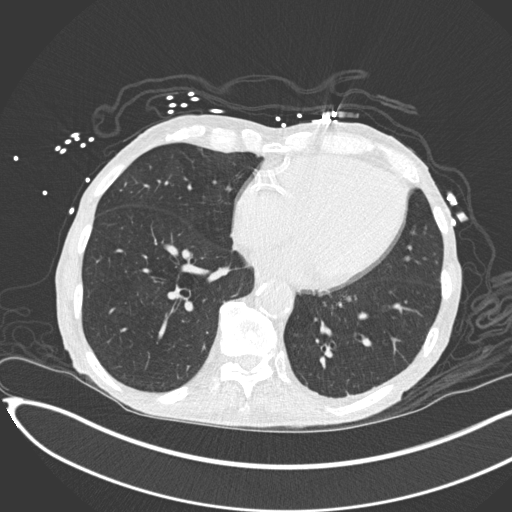
[im 101/188  lung]
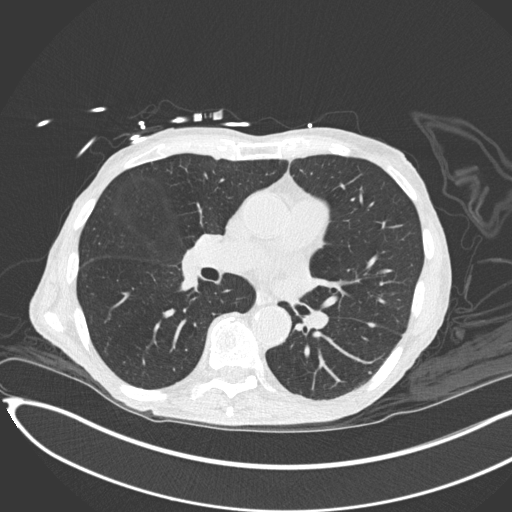
[im 116/188  lung]
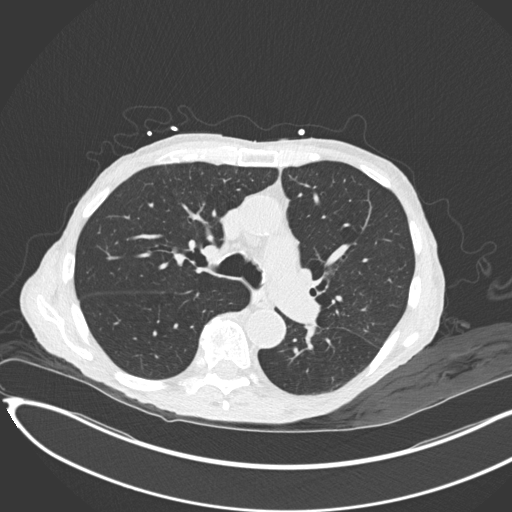
[im 130/188  lung]
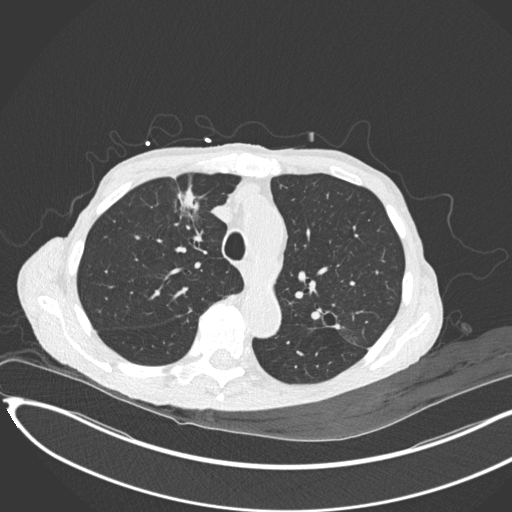
[im 144/188  mediastinal]
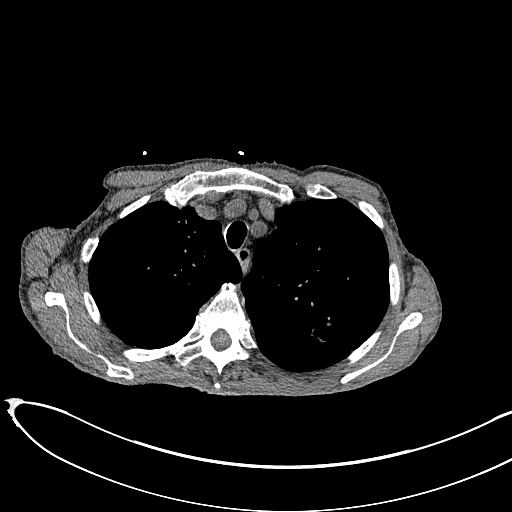
[im 144/188  lung]
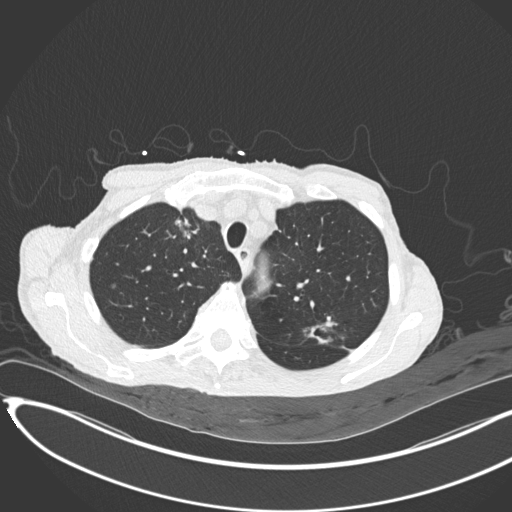
[im 159/188  lung]
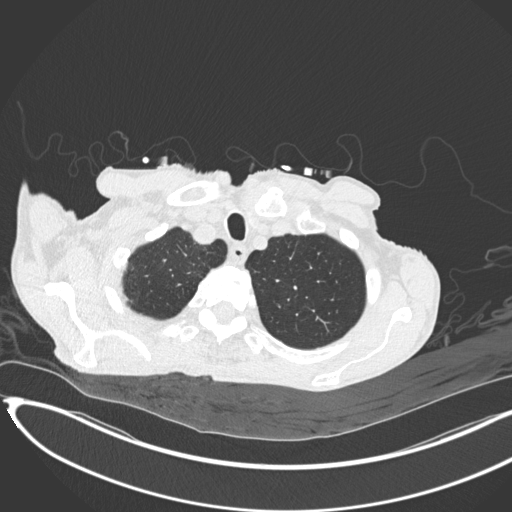
[im 173/188  lung]
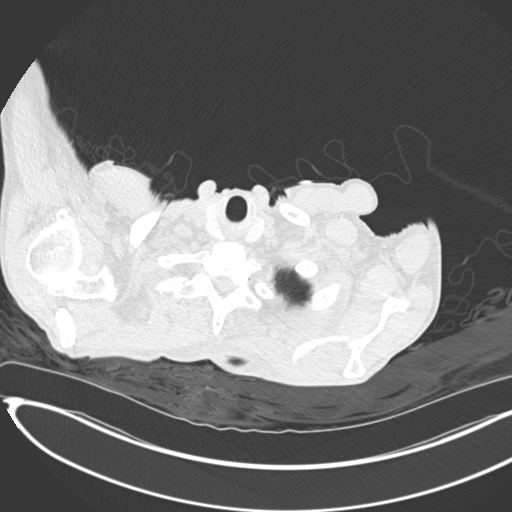

[Series 6: coronal · coronal · 0.73mm/px · 3 of 138 slices shown]
[im 28/138  lung]
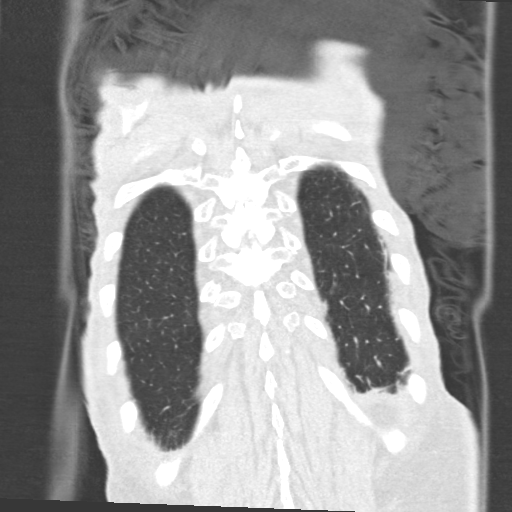
[im 55/138  lung]
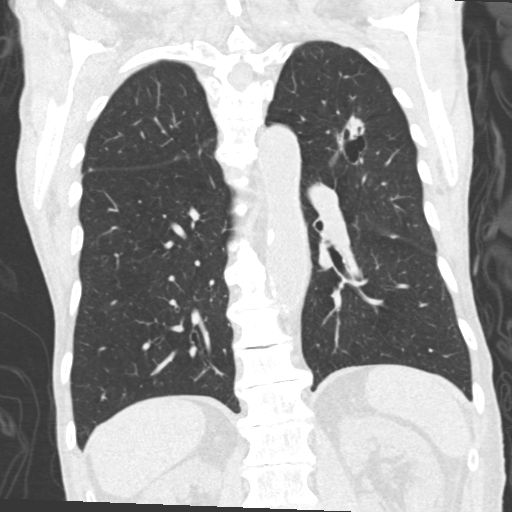
[im 83/138  lung]
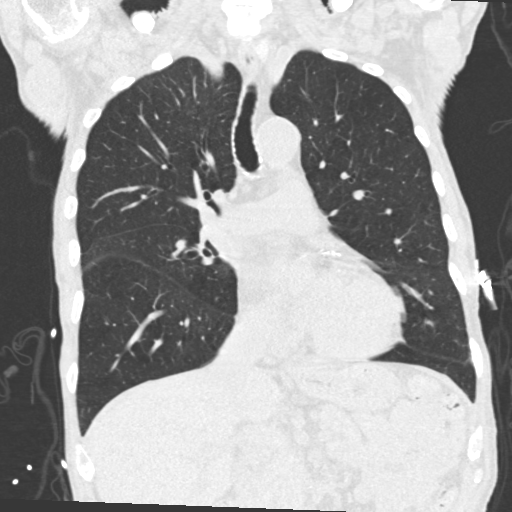

[14 of 36 positions shown; findings below may reference images not displayed]

FINDINGS: Cardiovascular: There is no appreciable thoracic aortic aneurysm.
There is mild calcification at the origin of the left subclavian
artery. Other visualized great vessels appear unremarkable on this
noncontrast enhanced study. There are foci of aortic
atherosclerosis. There are multiple foci of coronary artery
calcification. There is no pericardial effusion or pericardial
thickening.

Mediastinum/Nodes: The thyroid is not enlarged. There are nodular
opacities in the left lobe, none of which measure larger than 1 cm
in size. No appreciable thoracic adenopathy noted. Several
subcentimeter lymph nodes noted. No esophageal lesions are evident.

Lungs/Pleura: There is localized scarring and atelectasis in the
right upper lobe anteriorly and medially at the site noted on recent
CT. There is a nodular appearing opacity within this area measuring
8 x 6 mm, best seen on axial slice 36 series 5. There is a bullous
area with scarring in the posterior segment of the left upper lobe
measuring 3.1 x 2.0 cm. There is an area of scarring with focal
volume loss more inferiorly in the anterior segment of the left
upper lobe which is felt to primarily be due to inflammatory change.
There is an area that appears somewhat nodular within this area of
scarring measuring 0.9 x 0.8 cm, best seen on axial slice 57 series
5. There is a focal nodular opacity in the right middle lobe lateral
segment measuring 1.1 x 1.1 x 0.9 cm which is well defined. There is
atelectatic change in the medial left base. No pleural effusions.
There is mild lower lobe bronchiectatic change. No edema or
consolidation evident.

Upper Abdomen: There is upper abdominal aortic atherosclerosis.
Visualized upper abdominal structures otherwise appear normal.

Musculoskeletal: There is degenerative change in the thoracic spine.
Degenerative changes noted in the left shoulder. No blastic or lytic
bone lesions are evident. No chest wall lesions are appreciable.
IMPRESSION: 1. Areas of parenchymal lung scarring and atelectasis. Somewhat
nodular areas noted within areas of apparent scarring and
atelectasis of uncertain etiology. These areas may entirely be due
to inflammation; surveillance of these areas is warranted given the
overall appearance, however.

2. There is a well-defined nodular opacity in the right middle lobe
lateral segment measuring 1.1 x 1.1 x 0.9 cm which is potentially
more worrisome for a small neoplastic focus. Consider one of the
following in 3 months for both low-risk and high-risk individuals:
(a) repeat chest CT, (b) follow-up PET-CT, or (c) tissue sampling.
This recommendation follows the consensus statement: Guidelines for
Management of Incidental Pulmonary Nodules Detected on CT Images:
From the [HOSPITAL] 3441; Radiology 3441; [DATE]. Given
multiple nodular appearing areas, nuclear medicine PET study at this
time would be a reasonable consideration.

3. No edema or airspace opacity. There is mild lower lobe
bronchiectatic change bilaterally.

4.  No evident adenopathy.

5. Nodular opacities in the left lobe of the thyroid without
enlargement. In the setting of significant comorbidities or limited
life expectancy, no follow-up recommended beyond particular
attention to this area on repeat CT imaging. (Ref: [HOSPITAL].
[DATE]): 143-50).

6. Aortic atherosclerosis. Foci of coronary artery calcification
noted.

Aortic Atherosclerosis ([id number]).

## 2021-05-05 DIAGNOSIS — F0392 Unspecified dementia, unspecified severity, with psychotic disturbance: Secondary | ICD-10-CM | POA: Diagnosis not present

## 2021-05-05 DIAGNOSIS — Z7189 Other specified counseling: Secondary | ICD-10-CM | POA: Diagnosis not present

## 2021-05-05 DIAGNOSIS — E46 Unspecified protein-calorie malnutrition: Secondary | ICD-10-CM | POA: Diagnosis not present

## 2021-05-19 ENCOUNTER — Other Ambulatory Visit (HOSPITAL_COMMUNITY): Payer: Self-pay

## 2021-05-20 ENCOUNTER — Other Ambulatory Visit (HOSPITAL_COMMUNITY): Payer: Self-pay

## 2021-05-20 MED ORDER — BETAMETHASONE DIPROPIONATE 0.05 % EX OINT
1.0000 "application " | TOPICAL_OINTMENT | Freq: Two times a day (BID) | CUTANEOUS | 2 refills | Status: DC
  Filled 2021-05-20: qty 60, 30d supply, fill #0
  Filled 2021-06-29 – 2021-07-01 (×2): qty 60, 30d supply, fill #1
  Filled 2021-08-03: qty 60, 30d supply, fill #2

## 2021-05-21 ENCOUNTER — Other Ambulatory Visit (HOSPITAL_COMMUNITY): Payer: Self-pay

## 2021-05-22 ENCOUNTER — Other Ambulatory Visit (HOSPITAL_COMMUNITY): Payer: Self-pay

## 2021-05-26 ENCOUNTER — Other Ambulatory Visit (HOSPITAL_COMMUNITY): Payer: Self-pay

## 2021-05-26 MED ORDER — BETAMETHASONE DIPROPIONATE AUG 0.05 % EX OINT
1.0000 "application " | TOPICAL_OINTMENT | Freq: Two times a day (BID) | CUTANEOUS | 2 refills | Status: DC
  Filled 2021-05-26: qty 50, 25d supply, fill #0
  Filled 2021-07-01: qty 50, 25d supply, fill #1

## 2021-06-29 ENCOUNTER — Other Ambulatory Visit (HOSPITAL_COMMUNITY): Payer: Self-pay

## 2021-06-30 ENCOUNTER — Other Ambulatory Visit (HOSPITAL_COMMUNITY): Payer: Self-pay

## 2021-07-01 ENCOUNTER — Other Ambulatory Visit (HOSPITAL_COMMUNITY): Payer: Self-pay

## 2021-07-02 ENCOUNTER — Other Ambulatory Visit (HOSPITAL_COMMUNITY): Payer: Self-pay

## 2021-08-03 ENCOUNTER — Other Ambulatory Visit (HOSPITAL_COMMUNITY): Payer: Self-pay

## 2021-08-04 ENCOUNTER — Other Ambulatory Visit (HOSPITAL_COMMUNITY): Payer: Self-pay

## 2021-08-31 ENCOUNTER — Other Ambulatory Visit (HOSPITAL_COMMUNITY): Payer: Self-pay

## 2021-09-01 ENCOUNTER — Other Ambulatory Visit (HOSPITAL_COMMUNITY): Payer: Self-pay

## 2021-09-01 MED ORDER — BETAMETHASONE DIPROPIONATE 0.05 % EX OINT
1.0000 "application " | TOPICAL_OINTMENT | Freq: Two times a day (BID) | CUTANEOUS | 2 refills | Status: AC
  Filled 2021-09-01: qty 60, 30d supply, fill #0

## 2021-09-11 DEATH — deceased
